# Patient Record
Sex: Female | Born: 1935
Health system: Southern US, Community
[De-identification: ages and names within clinical notes are randomized; demographics above are authoritative.]

## PROBLEM LIST (undated history)

## (undated) DIAGNOSIS — H3321 Serous retinal detachment, right eye: Secondary | ICD-10-CM

## (undated) DIAGNOSIS — K146 Glossodynia: Secondary | ICD-10-CM

## (undated) DIAGNOSIS — K449 Diaphragmatic hernia without obstruction or gangrene: Secondary | ICD-10-CM

## (undated) DIAGNOSIS — K219 Gastro-esophageal reflux disease without esophagitis: Secondary | ICD-10-CM

## (undated) DIAGNOSIS — I4510 Unspecified right bundle-branch block: Secondary | ICD-10-CM

## (undated) DIAGNOSIS — Z9889 Other specified postprocedural states: Secondary | ICD-10-CM

## (undated) DIAGNOSIS — N814 Uterovaginal prolapse, unspecified: Secondary | ICD-10-CM

## (undated) DIAGNOSIS — H3530 Unspecified macular degeneration: Secondary | ICD-10-CM

## (undated) DIAGNOSIS — E785 Hyperlipidemia, unspecified: Secondary | ICD-10-CM

## (undated) DIAGNOSIS — K635 Polyp of colon: Secondary | ICD-10-CM

## (undated) DIAGNOSIS — R0600 Dyspnea, unspecified: Secondary | ICD-10-CM

## (undated) DIAGNOSIS — G4700 Insomnia, unspecified: Secondary | ICD-10-CM

## (undated) DIAGNOSIS — I050 Rheumatic mitral stenosis: Secondary | ICD-10-CM

## (undated) HISTORY — DX: Gastro-esophageal reflux disease without esophagitis: K21.9

## (undated) HISTORY — DX: Uterovaginal prolapse, unspecified: N81.4

## (undated) HISTORY — DX: Rheumatic mitral stenosis: I05.0

## (undated) HISTORY — DX: Unspecified macular degeneration: H35.30

## (undated) HISTORY — PX: ABDOMINAL HYSTERECTOMY: SHX81

## (undated) HISTORY — DX: Polyp of colon: K63.5

## (undated) HISTORY — DX: Diaphragmatic hernia without obstruction or gangrene: K44.9

## (undated) HISTORY — DX: Unspecified right bundle-branch block: I45.10

## (undated) HISTORY — DX: Insomnia, unspecified: G47.00

## (undated) HISTORY — DX: Hyperlipidemia, unspecified: E78.5

## (undated) HISTORY — DX: Serous retinal detachment, right eye: H33.21

## (undated) HISTORY — DX: Other specified postprocedural states: Z98.890

## (undated) HISTORY — PX: TONSILLECTOMY: SUR1361

## (undated) HISTORY — PX: FOOT SURGERY: SHX648

## (undated) HISTORY — PX: RETINAL DETACHMENT SURGERY: SHX105

## (undated) HISTORY — PX: OOPHORECTOMY: SHX86

## (undated) HISTORY — DX: Dyspnea, unspecified: R06.00

---
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
## 2002-05-06 ENCOUNTER — Other Ambulatory Visit: Admission: RE | Admit: 2002-05-06 | Discharge: 2002-05-06 | Payer: Self-pay | Admitting: Family Medicine

## 2002-11-07 ENCOUNTER — Encounter: Payer: Self-pay | Admitting: Ophthalmology

## 2002-11-08 ENCOUNTER — Ambulatory Visit (HOSPITAL_COMMUNITY): Admission: RE | Admit: 2002-11-08 | Discharge: 2002-11-11 | Payer: Self-pay | Admitting: Ophthalmology

## 2003-03-21 ENCOUNTER — Encounter: Admission: RE | Admit: 2003-03-21 | Discharge: 2003-03-21 | Payer: Self-pay | Admitting: Family Medicine

## 2003-03-21 ENCOUNTER — Encounter: Payer: Self-pay | Admitting: Family Medicine

## 2003-09-05 ENCOUNTER — Ambulatory Visit (HOSPITAL_COMMUNITY): Admission: RE | Admit: 2003-09-05 | Discharge: 2003-09-06 | Payer: Self-pay | Admitting: Ophthalmology

## 2003-10-08 ENCOUNTER — Ambulatory Visit (HOSPITAL_COMMUNITY): Admission: RE | Admit: 2003-10-08 | Discharge: 2003-10-08 | Payer: Self-pay | Admitting: Ophthalmology

## 2003-11-10 ENCOUNTER — Ambulatory Visit (HOSPITAL_COMMUNITY): Admission: RE | Admit: 2003-11-10 | Discharge: 2003-11-10 | Payer: Self-pay | Admitting: Ophthalmology

## 2004-04-13 ENCOUNTER — Encounter: Admission: RE | Admit: 2004-04-13 | Discharge: 2004-04-13 | Payer: Self-pay | Admitting: Family Medicine

## 2004-09-16 ENCOUNTER — Ambulatory Visit (HOSPITAL_COMMUNITY): Admission: RE | Admit: 2004-09-16 | Discharge: 2004-09-16 | Payer: Self-pay | Admitting: *Deleted

## 2004-09-16 ENCOUNTER — Encounter (INDEPENDENT_AMBULATORY_CARE_PROVIDER_SITE_OTHER): Payer: Self-pay | Admitting: *Deleted

## 2005-04-25 ENCOUNTER — Encounter: Admission: RE | Admit: 2005-04-25 | Discharge: 2005-04-25 | Payer: Self-pay | Admitting: Family Medicine

## 2006-05-19 ENCOUNTER — Encounter: Admission: RE | Admit: 2006-05-19 | Discharge: 2006-05-19 | Payer: Self-pay | Admitting: Family Medicine

## 2006-10-12 ENCOUNTER — Ambulatory Visit (HOSPITAL_COMMUNITY): Admission: RE | Admit: 2006-10-12 | Discharge: 2006-10-12 | Payer: Self-pay | Admitting: *Deleted

## 2007-03-19 ENCOUNTER — Ambulatory Visit (HOSPITAL_COMMUNITY): Admission: RE | Admit: 2007-03-19 | Discharge: 2007-03-19 | Payer: Self-pay | Admitting: Ophthalmology

## 2007-03-21 LAB — HM COLONOSCOPY: HM Colonoscopy: NORMAL

## 2007-05-15 ENCOUNTER — Encounter: Admission: RE | Admit: 2007-05-15 | Discharge: 2007-05-15 | Payer: Self-pay | Admitting: *Deleted

## 2007-05-21 ENCOUNTER — Encounter: Admission: RE | Admit: 2007-05-21 | Discharge: 2007-05-21 | Payer: Self-pay | Admitting: Family Medicine

## 2008-05-23 ENCOUNTER — Encounter: Admission: RE | Admit: 2008-05-23 | Discharge: 2008-05-23 | Payer: Self-pay | Admitting: Family Medicine

## 2008-12-23 ENCOUNTER — Encounter: Payer: Self-pay | Admitting: Family Medicine

## 2009-03-24 ENCOUNTER — Ambulatory Visit: Payer: Self-pay | Admitting: Family Medicine

## 2009-03-24 DIAGNOSIS — Z860100 Personal history of colon polyps, unspecified: Secondary | ICD-10-CM | POA: Insufficient documentation

## 2009-03-24 DIAGNOSIS — E785 Hyperlipidemia, unspecified: Secondary | ICD-10-CM | POA: Insufficient documentation

## 2009-03-24 DIAGNOSIS — E119 Type 2 diabetes mellitus without complications: Secondary | ICD-10-CM | POA: Insufficient documentation

## 2009-03-24 DIAGNOSIS — K219 Gastro-esophageal reflux disease without esophagitis: Secondary | ICD-10-CM | POA: Insufficient documentation

## 2009-03-24 DIAGNOSIS — E1169 Type 2 diabetes mellitus with other specified complication: Secondary | ICD-10-CM | POA: Insufficient documentation

## 2009-03-25 LAB — CONVERTED CEMR LAB: Hgb A1c MFr Bld: 6.6 % — ABNORMAL HIGH (ref 4.6–6.5)

## 2009-05-20 ENCOUNTER — Encounter (INDEPENDENT_AMBULATORY_CARE_PROVIDER_SITE_OTHER): Payer: Self-pay | Admitting: *Deleted

## 2009-05-25 ENCOUNTER — Encounter: Admission: RE | Admit: 2009-05-25 | Discharge: 2009-05-25 | Payer: Self-pay | Admitting: Family Medicine

## 2009-07-08 ENCOUNTER — Ambulatory Visit: Payer: Self-pay | Admitting: Family Medicine

## 2009-07-08 LAB — CONVERTED CEMR LAB
Glucose, Urine, Semiquant: NEGATIVE
HDL goal, serum: 40 mg/dL
LDL Goal: 100 mg/dL
Specific Gravity, Urine: 1.015
pH: 6

## 2009-07-10 LAB — CONVERTED CEMR LAB
ALT: 21 units/L (ref 0–35)
AST: 23 units/L (ref 0–37)
Alkaline Phosphatase: 61 units/L (ref 39–117)
Bilirubin, Direct: 0.1 mg/dL (ref 0.0–0.3)
Microalb Creat Ratio: 9.1 mg/g (ref 0.0–30.0)
Total Bilirubin: 0.8 mg/dL (ref 0.3–1.2)
Total CHOL/HDL Ratio: 3

## 2009-07-30 ENCOUNTER — Ambulatory Visit: Payer: Self-pay | Admitting: Family Medicine

## 2009-07-30 LAB — CONVERTED CEMR LAB
Bilirubin Urine: NEGATIVE
Glucose, Urine, Semiquant: NEGATIVE
Protein, U semiquant: NEGATIVE
Specific Gravity, Urine: 1.02
pH: 7

## 2009-09-02 ENCOUNTER — Ambulatory Visit: Payer: Self-pay | Admitting: Family Medicine

## 2009-09-02 LAB — CONVERTED CEMR LAB
BUN: 15 mg/dL (ref 6–23)
Basophils Absolute: 0 10*3/uL (ref 0.0–0.1)
CO2: 28 meq/L (ref 19–32)
Chloride: 107 meq/L (ref 96–112)
Eosinophils Relative: 1.6 % (ref 0.0–5.0)
GFR calc non Af Amer: 65.26 mL/min (ref >60)
HCT: 40.4 % (ref 36.0–46.0)
Hemoglobin: 13.2 g/dL (ref 12.0–15.0)
Lymphocytes Relative: 33.9 % (ref 12.0–46.0)
Lymphs Abs: 2.1 10*3/uL (ref 0.7–4.0)
Monocytes Relative: 8.2 % (ref 3.0–12.0)
Neutro Abs: 3.5 10*3/uL (ref 1.4–7.7)
Platelets: 314 10*3/uL (ref 150.0–400.0)
RDW: 12.3 % (ref 11.5–14.6)
Sodium: 140 meq/L (ref 135–145)
WBC: 6.2 10*3/uL (ref 4.5–10.5)

## 2009-09-16 ENCOUNTER — Ambulatory Visit: Payer: Self-pay | Admitting: Family Medicine

## 2009-11-11 ENCOUNTER — Ambulatory Visit: Payer: Self-pay | Admitting: Family Medicine

## 2010-01-06 ENCOUNTER — Ambulatory Visit: Payer: Self-pay | Admitting: Family Medicine

## 2010-01-06 DIAGNOSIS — G4700 Insomnia, unspecified: Secondary | ICD-10-CM | POA: Insufficient documentation

## 2010-05-17 ENCOUNTER — Encounter: Payer: Self-pay | Admitting: Family Medicine

## 2010-05-17 ENCOUNTER — Ambulatory Visit: Payer: Self-pay | Admitting: Family Medicine

## 2010-05-24 ENCOUNTER — Telehealth: Payer: Self-pay | Admitting: Family Medicine

## 2010-05-26 ENCOUNTER — Encounter
Admission: RE | Admit: 2010-05-26 | Discharge: 2010-05-26 | Payer: Self-pay | Source: Home / Self Care | Admitting: Family Medicine

## 2010-05-26 LAB — HM MAMMOGRAPHY

## 2010-06-26 ENCOUNTER — Ambulatory Visit
Admission: RE | Admit: 2010-06-26 | Discharge: 2010-06-26 | Payer: Self-pay | Source: Home / Self Care | Attending: Family Medicine | Admitting: Family Medicine

## 2010-07-14 ENCOUNTER — Other Ambulatory Visit: Payer: Self-pay | Admitting: Family Medicine

## 2010-07-14 ENCOUNTER — Ambulatory Visit
Admission: RE | Admit: 2010-07-14 | Discharge: 2010-07-14 | Payer: Self-pay | Source: Home / Self Care | Attending: Family Medicine | Admitting: Family Medicine

## 2010-07-14 LAB — MICROALBUMIN / CREATININE URINE RATIO: Creatinine,U: 108.1 mg/dL

## 2010-07-14 LAB — LIPID PANEL
Cholesterol: 290 mg/dL — ABNORMAL HIGH (ref 0–200)
HDL: 67.4 mg/dL (ref >39.00)
VLDL: 37.2 mg/dL (ref 0.0–40.0)

## 2010-07-14 LAB — HEPATIC FUNCTION PANEL
ALT: 22 U/L (ref 0–35)
Albumin: 4.2 g/dL (ref 3.5–5.2)
Alkaline Phosphatase: 62 U/L (ref 39–117)
Total Protein: 7.1 g/dL (ref 6.0–8.3)

## 2010-07-14 LAB — HM DIABETES FOOT EXAM

## 2010-07-22 NOTE — Assessment & Plan Note (Signed)
Summary: 6 month rov/njr   Vital Signs:  Patient profile:   76 year old female Menstrual status:  hysterectomy Height:      61.50 inches Weight:      145 pounds Temp:     98.3 degrees F oral BP sitting:   120 / 78  (left arm) Cuff size:   regular  Vitals Entered By: Sid Falcon LPN (July 14, 2010 10:18 AM) CC: 6 month follow-up, Lipid Management Is Patient Diabetic? Yes Did you bring your meter with you today? No   History of Present Illness: Patient here for multiple medical problems followup. Type 2 diabetes well-controlled with recent fasting blood sugars around 110-120. One brief episode of mild hypoglycemia yesterday otherwise no episodes. Compliant with all medications. Last eye exam in December.  History of GERD controlled with Nexium. Recurrent symptoms when she tries to stop medication.  History of hyperlipidemia treated with simvastatin. No myalgias. Due for lab work. She relates that she stopped lipid med about 3 weeks ago after hearing some neg report about Simvastatin on the news.  Denies any recent chest pain, dizziness, or shortness of breath.  Diabetes Management History:      She has not been enrolled in the "Diabetic Education Program".  She is checking home blood sugars.  She says that she is not exercising regularly.        No hyperglycemic symptoms are reported.    Lipid Management History:      Positive NCEP/ATP III risk factors include female age 37 years old or older and diabetes.  Negative NCEP/ATP III risk factors include HDL cholesterol greater than 60, no family history for ischemic heart disease, non-tobacco-user status, non-hypertensive, no ASHD (atherosclerotic heart disease), no prior stroke/TIA, no peripheral vascular disease, and no history of aortic aneurysm.     Allergies (verified): No Known Drug Allergies  Past History:  Past Medical History: Last updated: 03/24/2009 Diabetes Colonic polyps, hx  of GERD Hyperlipidemia UTI  Past Surgical History: Last updated: 03/24/2009 Hysterectomy Tonsillectomy Foot surgery 1994 Detached retina 2003-2008  Family History: Last updated: 03/24/2009 Family History Breast cancer  family history heart disease  Social History: Last updated: 03/24/2009 Retired from [employer] Married Never Smoked Alcohol use-no Regular exercise-no  Risk Factors: Exercise: no (03/24/2009)  Risk Factors: Smoking Status: never (03/24/2009) PMH-FH-SH reviewed for relevance  Review of Systems  The patient denies anorexia, fever, weight loss, weight gain, vision loss, decreased hearing, chest pain, syncope, dyspnea on exertion, peripheral edema, prolonged cough, headaches, hemoptysis, abdominal pain, melena, hematochezia, severe indigestion/heartburn, incontinence, muscle weakness, suspicious skin lesions, transient blindness, difficulty walking, depression, unusual weight change, abnormal bleeding, enlarged lymph nodes, and breast masses.    Physical Exam  General:  Well-developed,well-nourished,in no acute distress; alert,appropriate and cooperative throughout examination Head:  Normocephalic and atraumatic without obvious abnormalities. No apparent alopecia or balding. Eyes:  pupils equal, pupils round, and pupils reactive to light.  Cataract L eye Ears:  External ear exam shows no significant lesions or deformities.  Otoscopic examination reveals clear canals, tympanic membranes are intact bilaterally without bulging, retraction, inflammation or discharge. Hearing is grossly normal bilaterally. Mouth:  Oral mucosa and oropharynx without lesions or exudates.  Teeth in good repair. Neck:  No deformities, masses, or tenderness noted. Lungs:  Normal respiratory effort, chest expands symmetrically. Lungs are clear to auscultation, no crackles or wheezes. Heart:  Normal rate and regular rhythm. S1 and S2 normal without gallop, murmur, click, rub or other  extra sounds. Abdomen:  soft and non-tender.   Extremities:  No clubbing, cyanosis, edema, or deformity noted with normal full range of motion of all joints.   Neurologic:  alert & oriented X3 and cranial nerves II-XII intact.   Skin:  no rashes and no suspicious lesions.   Cervical Nodes:  No lymphadenopathy noted Psych:  Oriented X3, normally interactive, good eye contact, and not anxious appearing.    Diabetes Management Exam:    Foot Exam (with socks and/or shoes not present):       Sensory-Pinprick/Light touch:          Left medial foot (L-4): normal          Left dorsal foot (L-5): normal          Left lateral foot (S-1): normal          Right medial foot (L-4): normal          Right dorsal foot (L-5): normal          Right lateral foot (S-1): normal       Sensory-Monofilament:          Left foot: normal          Right foot: normal       Inspection:          Left foot: normal          Right foot: normal       Nails:          Left foot: normal          Right foot: normal   Impression & Recommendations:  Problem # 1:  DIAB W/O COMP TYPE II/UNS NOT STATED UNCNTRL (ICD-250.00)  Her updated medication list for this problem includes:    Aspirin 81 Mg Tabs (Aspirin) ..... Once daily    Metformin Hcl 500 Mg Tabs (Metformin hcl) .Marland Kitchen... 2 tabs two times a day    Amaryl 4 Mg Tabs (Glimepiride) ..... Once daily  Orders: Venipuncture (04540) Specimen Handling (98119) TLB-A1C / Hgb A1C (Glycohemoglobin) (83036-A1C) TLB-Microalbumin/Creat Ratio, Urine (82043-MALB)  Problem # 2:  HYPERLIPIDEMIA (ICD-272.4) poor compliance with therapy. Her updated medication list for this problem includes:    Simvastatin 40 Mg Tabs (Simvastatin) ..... One by mouth q hs  Orders: Venipuncture (14782) Specimen Handling (95621) TLB-Lipid Panel (80061-LIPID) TLB-Hepatic/Liver Function Pnl (80076-HEPATIC)  Problem # 3:  INSOMNIA, CHRONIC (ICD-307.42)  Problem # 4:  GERD (ICD-530.81)  Her  updated medication list for this problem includes:    Nexium 40 Mg Cpdr (Esomeprazole magnesium) ..... Once daily  Complete Medication List: 1)  Aspirin 81 Mg Tabs (Aspirin) .... Once daily 2)  Metformin Hcl 500 Mg Tabs (Metformin hcl) .... 2 tabs two times a day 3)  Simvastatin 40 Mg Tabs (Simvastatin) .... One by mouth q hs 4)  Nexium 40 Mg Cpdr (Esomeprazole magnesium) .... Once daily 5)  Amaryl 4 Mg Tabs (Glimepiride) .... Once daily 6)  Allegra 180 Mg Tabs (Fexofenadine hcl) .... As needed 7)  Alprazolam 0.5 Mg Tabs (Alprazolam) .... 1/2 tab at bedtime prn 8)  B-100 Complex Tabs (Vitamins-lipotropics) .... Once daily 9)  Daily Vitamins For Women Tabs (Multiple vitamins-calcium) .... Once daily 10)  Oysco D 250-125 Mg-unit Tabs (Calcium carbonate-vitamin d) .... Once daily 11)  Tramadol Hcl 50 Mg Tabs (Tramadol hcl) .Marland Kitchen.. 1-2 tablets by mouth q 6 hours as needed pain 12)  Ibuprofen 800 Mg Tabs (Ibuprofen) .... One tab every 8 hours with food as needed pain 13)  Cyclobenzaprine  Hcl 5 Mg Tabs (Cyclobenzaprine hcl) .... One tab at bedtime as needed 14)  Hydromet 5-1.5 Mg/36ml Syrp (Hydrocodone-homatropine) .... 1/2 to 1 tsp three times a day prn  Diabetes Management Assessment/Plan:      The following lipid goals have been established for the patient: Total cholesterol goal of 200; LDL cholesterol goal of 100; HDL cholesterol goal of 40; Triglyceride goal of 150.    Lipid Assessment/Plan:      Based on NCEP/ATP III, the patient's risk factor category is "history of diabetes".  The patient's lipid goals are as follows: Total cholesterol goal is 200; LDL cholesterol goal is 100; HDL cholesterol goal is 40; Triglyceride goal is 150.    Patient Instructions: 1)  Please schedule a follow-up appointment in 6 months .  2)  It is important that you exercise reguarly at least 20 minutes 5 times a week. If you develop chest pain, have severe difficulty breathing, or feel very tired, stop exercising  immediately and seek medical attention.  3)  Check your blood sugars regularly. If your readings are usually above:  or below 70 you should contact our office.  4)  It is important that your diabetic A1c level is checked every 3 months.  5)  See your eye doctor yearly to check for diabetic eye damage. 6)  Check your feet each night  for sore areas, calluses or signs of infection.  Prescriptions: ALPRAZOLAM 0.5 MG TABS (ALPRAZOLAM) 1/2 tab at bedtime prn  #30 x 2   Entered and Authorized by:   Evelena Peat MD   Signed by:   Evelena Peat MD on 07/14/2010   Method used:   Print then Give to Patient   RxID:   [id number]    Orders Added: 1)  Venipuncture [44010] 2)  Specimen Handling [99000] 3)  TLB-Lipid Panel [80061-LIPID] 4)  TLB-Hepatic/Liver Function Pnl [80076-HEPATIC] 5)  TLB-A1C / Hgb A1C (Glycohemoglobin) [83036-A1C] 6)  TLB-Microalbumin/Creat Ratio, Urine [82043-MALB] 7)  Est. Patient Level IV [27253]

## 2010-07-22 NOTE — Assessment & Plan Note (Signed)
Summary: 2 wk rov/njr   Vital Signs:  Patient profile:   76 year old female Menstrual status:  hysterectomy Temp:     99.0 degrees F oral BP sitting:   118 / 70  (left arm) Cuff size:   regular  Vitals Entered By: Sid Falcon LPN (September 16, 2009 11:11 AM) CC: 2 week follow-up visit for back pain   History of Present Illness: Followup back pain. Midthoracic area somewhat poorly localized. We obtained x-rays including chest x-ray and thoracic spine films which were unremarkable. Labs including CBC, chemistries and sedimentation rate were normal. Pain still rated occasionally 8/10 in severity. Aleve helps. Overall pain is improved somewhat. No clear aggravating factors. Has good appetite and no weight change. Denies any dyspnea or pleuritic pain. No focal weakness  Allergies (verified): No Known Drug Allergies  Past History:  Past Medical History: Last updated: 03/24/2009 Diabetes Colonic polyps, hx of GERD Hyperlipidemia UTI  Review of Systems      See HPI  Physical Exam  General:  Well-developed,well-nourished,in no acute distress; alert,appropriate and cooperative throughout examination Lungs:  Normal respiratory effort, chest expands symmetrically. Lungs are clear to auscultation, no crackles or wheezes. Heart:  normal rate and regular rhythm.   Msk:  no spinal tenderness. No reproducible muscular tenderness. Neurologic:  alert & oriented X3, cranial nerves II-XII intact, and strength normal in all extremities.   Skin:  no rash   Impression & Recommendations:  Problem # 1:  BACK PAIN, THORACIC REGION (ICD-724.1) Assessment Improved continue Aleve as needed. Patient be in touch if pain not resolving over the next few weeks Her updated medication list for this problem includes:    Aspirin 81 Mg Tabs (Aspirin) ..... Once daily  Complete Medication List: 1)  Aspirin 81 Mg Tabs (Aspirin) .... Once daily 2)  Metformin Hcl 500 Mg Tabs (Metformin hcl) .... 2 tabs two  times a day 3)  Simvastatin 40 Mg Tabs (Simvastatin) .... One by mouth q hs 4)  Nexium 40 Mg Cpdr (Esomeprazole magnesium) .... Once daily 5)  Amaryl 4 Mg Tabs (Glimepiride) .... Once daily 6)  Allegra 180 Mg Tabs (Fexofenadine hcl) .... As needed 7)  Alprazolam 0.5 Mg Tabs (Alprazolam) .... 1/2 tab at bedtime prn 8)  B-100 Complex Tabs (Vitamins-lipotropics) .... Once daily 9)  Daily Vitamins For Women Tabs (Multiple vitamins-calcium) .... Once daily 10)  Oysco D 250-125 Mg-unit Tabs (Calcium carbonate-vitamin d) .... Once daily

## 2010-07-22 NOTE — Assessment & Plan Note (Signed)
Summary: congestion//ccm   Vital Signs:  Patient profile:   76 year old female Menstrual status:  hysterectomy Temp:     98.9 degrees F oral BP sitting:   104 / 70  (left arm) Cuff size:   regular  Vitals Entered By: Sid Falcon LPN (Nov 11, 2009 4:16 PM) CC: Congestion, cough, nasal drainage X 5 days   History of Present Illness: onset Sat of cough, nasal cong, fatigue. No fever. No sore throat.  No nausea, vomiting , diarrhea.  Taking OTC antihistamine with minimal reilief. No sick contacts.  Allergies (verified): No Known Drug Allergies  Past History:  Past Medical History: Last updated: 03/24/2009 Diabetes Colonic polyps, hx of GERD Hyperlipidemia UTI  Past Surgical History: Last updated: 03/24/2009 Hysterectomy Tonsillectomy Foot surgery 1994 Detached retina 2003-2008  Social History: Last updated: 03/24/2009 Retired from [employer] Married Never Smoked Alcohol use-no Regular exercise-no PMH-FH-SH reviewed for relevance  Review of Systems       The patient complains of melena.  The patient denies fever, weight loss, chest pain, syncope, dyspnea on exertion, prolonged cough, headaches, and hemoptysis.    Physical Exam  General:  Well-developed,well-nourished,in no acute distress; alert,appropriate and cooperative throughout examination Ears:  External ear exam shows no significant lesions or deformities.  Otoscopic examination reveals clear canals, tympanic membranes are intact bilaterally without bulging, retraction, inflammation or discharge. Hearing is grossly normal bilaterally. Nose:  clear mucus. Mouth:  Oral mucosa and oropharynx without lesions or exudates.  Teeth in good repair. Neck:  No deformities, masses, or tenderness noted. Lungs:  Normal respiratory effort, chest expands symmetrically. Lungs are clear to auscultation, no crackles or wheezes. Heart:  normal rate and regular rhythm.     Impression & Recommendations:  Problem #  1:  VIRAL URI (ICD-465.9) Assessment New cough med as needed. Her updated medication list for this problem includes:    Aspirin 81 Mg Tabs (Aspirin) ..... Once daily    Allegra 180 Mg Tabs (Fexofenadine hcl) .Marland Kitchen... As needed    Hydrocodone-homatropine 5-1.5 Mg/41ml Syrp (Hydrocodone-homatropine) ..... One tsp by mouth q 4-6 hours as needed cough  Orders: Prescription Created Electronically [id number])  Complete Medication List: 1)  Aspirin 81 Mg Tabs (Aspirin) .... Once daily 2)  Metformin Hcl 500 Mg Tabs (Metformin hcl) .... 2 tabs two times a day 3)  Simvastatin 40 Mg Tabs (Simvastatin) .... One by mouth q hs 4)  Nexium 40 Mg Cpdr (Esomeprazole magnesium) .... Once daily 5)  Amaryl 4 Mg Tabs (Glimepiride) .... Once daily 6)  Allegra 180 Mg Tabs (Fexofenadine hcl) .... As needed 7)  Alprazolam 0.5 Mg Tabs (Alprazolam) .... 1/2 tab at bedtime prn 8)  B-100 Complex Tabs (Vitamins-lipotropics) .... Once daily 9)  Daily Vitamins For Women Tabs (Multiple vitamins-calcium) .... Once daily 10)  Oysco D 250-125 Mg-unit Tabs (Calcium carbonate-vitamin d) .... Once daily 11)  Hydrocodone-homatropine 5-1.5 Mg/18ml Syrp (Hydrocodone-homatropine) .... One tsp by mouth q 4-6 hours as needed cough  Patient Instructions: 1)  Get plenty of rest, drink lots of clear liquids, and use Tylenol or Ibuprofen for fever and comfort. Return in 7-10 days if you're not better: sooner if you'er feeling worse.  Prescriptions: HYDROCODONE-HOMATROPINE 5-1.5 MG/5ML SYRP (HYDROCODONE-HOMATROPINE) one tsp by mouth q 4-6 hours as needed cough  #120 ml x 0   Entered and Authorized by:   Evelena Peat MD   Signed by:   Evelena Peat MD on 11/11/2009   Method used:   Print then Give to  Patient   RxID:   [id number]

## 2010-07-22 NOTE — Assessment & Plan Note (Signed)
Summary: 3 month rov/njr---PT [facility] // RS/pt rescd//ccm   Vital Signs:  Patient profile:   76 year old female Menstrual status:  hysterectomy Weight:      147 pounds Temp:     97.8 degrees F oral BP sitting:   120 / 78  (left arm) Cuff size:   regular  Vitals Entered By: Sid Falcon LPN (July 08, 2009 8:49 AM) CC: 3 month follow up visit, pt fasting, Lipid Management   History of Present Illness: Patient here for medical followup. Type 2 diabetes which has been well-controlled by recent A1c. Hyperlipidemia treated with Crestor. Had been taking inconsistently but taking regularly since last visit. Due for lab work.  Blood sugars well controlled. No hypoglycemic symptoms. Recent eye exam December 1 normal with no retinopathy changes.  Diabetes Management History:      She has not been enrolled in the "Diabetic Education Program".  She states understanding of dietary principles and is following her diet appropriately.  No sensory loss is reported.  Self foot exams are being performed.  She is checking home blood sugars.  She says that she is not exercising regularly.        Hypoglycemic symptoms are not occurring.  No hyperglycemic symptoms are reported.        Symptoms which suggest diabetic complications include none and vision problems.  No changes have been made to her treatment plan since last visit.    Lipid Management History:      Positive NCEP/ATP III risk factors include female age 56 years old or older and diabetes.  Negative NCEP/ATP III risk factors include no family history for ischemic heart disease, non-tobacco-user status, non-hypertensive, no ASHD (atherosclerotic heart disease), no prior stroke/TIA, no peripheral vascular disease, and no history of aortic aneurysm.     Allergies (verified): No Known Drug Allergies  Past History:  Past Medical History: Last updated: 03/24/2009 Diabetes Colonic polyps, hx of GERD Hyperlipidemia UTI  Past Surgical  History: Last updated: 03/24/2009 Hysterectomy Tonsillectomy Foot surgery 1994 Detached retina 2003-2008  Social History: Last updated: 03/24/2009 Retired from [employer] Married Never Smoked Alcohol use-no Regular exercise-no  Review of Systems  The patient denies chest pain, syncope, dyspnea on exertion, peripheral edema, prolonged cough, headaches, hemoptysis, abdominal pain, melena, hematochezia, hematuria, and incontinence.    Physical Exam  General:  Well-developed,well-nourished,in no acute distress; alert,appropriate and cooperative throughout examination Eyes:  vision grossly intact.   Ears:  External ear exam shows no significant lesions or deformities.  Otoscopic examination reveals clear canals, tympanic membranes are intact bilaterally without bulging, retraction, inflammation or discharge. Hearing is grossly normal bilaterally. Mouth:  Oral mucosa and oropharynx without lesions or exudates.  Teeth in good repair. Neck:  No deformities, masses, or tenderness noted. Lungs:  Normal respiratory effort, chest expands symmetrically. Lungs are clear to auscultation, no crackles or wheezes. Heart:  Normal rate and regular rhythm. S1 and S2 normal without gallop, murmur, click, rub or other extra sounds. Extremities:  No clubbing, cyanosis, edema, or deformity noted with normal full range of motion of all joints.    Diabetes Management Exam:    Foot Exam (with socks and/or shoes not present):       Sensory-Pinprick/Light touch:          Left medial foot (L-4): normal          Left dorsal foot (L-5): normal          Left lateral foot (S-1): normal  Right medial foot (L-4): normal          Right dorsal foot (L-5): normal          Right lateral foot (S-1): normal       Sensory-Monofilament:          Left foot: normal          Right foot: normal       Inspection:          Left foot: normal          Right foot: normal       Nails:          Left foot: normal           Right foot: normal    Eye Exam:       Eye Exam done elsewhere          Date: 05/20/2009          Results: normal          Done by: Dr Dione Booze   Impression & Recommendations:  Problem # 1:  DIAB W/O COMP TYPE II/UNS NOT STATED UNCNTRL (ICD-250.00)  Her updated medication list for this problem includes:    Aspirin 81 Mg Tabs (Aspirin) ..... Once daily    Metformin Hcl 500 Mg Tabs (Metformin hcl) .Marland Kitchen... 2 tabs two times a day    Amaryl 4 Mg Tabs (Glimepiride) ..... Once daily  Orders: Venipuncture (16109) TLB-A1C / Hgb A1C (Glycohemoglobin) (83036-A1C) TLB-Microalbumin/Creat Ratio, Urine (82043-MALB)  Problem # 2:  HYPERLIPIDEMIA (ICD-272.4)  Her updated medication list for this problem includes:    Crestor 10 Mg Tabs (Rosuvastatin calcium) ..... Once daily  Orders: Venipuncture (60454) TLB-Lipid Panel (80061-LIPID) TLB-Hepatic/Liver Function Pnl (80076-HEPATIC)  Complete Medication List: 1)  Aspirin 81 Mg Tabs (Aspirin) .... Once daily 2)  Metformin Hcl 500 Mg Tabs (Metformin hcl) .... 2 tabs two times a day 3)  Crestor 10 Mg Tabs (Rosuvastatin calcium) .... Once daily 4)  Nexium 40 Mg Cpdr (Esomeprazole magnesium) .... Once daily 5)  Amaryl 4 Mg Tabs (Glimepiride) .... Once daily 6)  Allegra 180 Mg Tabs (Fexofenadine hcl) .... As needed 7)  Alprazolam 0.5 Mg Tabs (Alprazolam) .... 1/2 tab at bedtime prn 8)  B-100 Complex Tabs (Vitamins-lipotropics) .... Once daily 9)  Daily Vitamins For Women Tabs (Multiple vitamins-calcium) .... Once daily 10)  Oysco D 250-125 Mg-unit Tabs (Calcium carbonate-vitamin d) .... Once daily  Diabetes Management Assessment/Plan:      The following lipid goals have been established for the patient: Total cholesterol goal of 200; LDL cholesterol goal of 100; HDL cholesterol goal of 40; Triglyceride goal of 150.    Lipid Assessment/Plan:      Based on NCEP/ATP III, the patient's risk factor category is "history of diabetes".  The patient's  lipid goals are as follows: Total cholesterol goal is 200; LDL cholesterol goal is 100; HDL cholesterol goal is 40; Triglyceride goal is 150.    Patient Instructions: 1)  Please schedule a follow-up appointment in 6 months .  2)  It is important that you exercise reguarly at least 20 minutes 5 times a week. If you develop chest pain, have severe difficulty breathing, or feel very tired, stop exercising immediately and seek medical attention.  3)  Check your blood sugars regularly. If your readings are usually above:150  or below 70 you should contact our office.  4)  It is important that your diabetic A1c level is checked every 3 months.  5)  See your eye doctor yearly to check for diabetic eye damage. 6)  Check your feet each night  for sore areas, calluses or signs of infection.    Laboratory Results   Urine Tests    Routine Urinalysis   Color: yellow Appearance: Clear Glucose: negative   (Normal Range: Negative) Bilirubin: negative   (Normal Range: Negative) Ketone: negative   (Normal Range: Negative) Spec. Gravity: 1.015   (Normal Range: 1.003-1.035) Blood: 1+   (Normal Range: Negative) pH: 6.0   (Normal Range: 5.0-8.0) Protein: negative   (Normal Range: Negative) Urobilinogen: 0.2   (Normal Range: 0-1) Nitrite: negative   (Normal Range: Negative) Leukocyte Esterace: 1+   (Normal Range: Negative)    Comments: Rita Ohara  July 08, 2009 1:32 PM

## 2010-07-22 NOTE — Assessment & Plan Note (Signed)
Summary: BACK PAIN // RS   Vital Signs:  Patient profile:   76 year old female Menstrual status:  hysterectomy Weight:      148 pounds O2 Sat:      95 % on Room air Temp:     98.0 degrees F oral BP sitting:   140 / 78  (left arm) Cuff size:   regular  Vitals Entered By: Sid Falcon LPN (September 02, 2009 1:48 PM)  O2 Flow:  Room air CC: mid back pain several months   History of Present Illness: Patient here for the following items.     Mid thoracic back pain which is poorly localized. Is not sure but thinks she's had several weeks and possibly several months of pain. This is a dull pain sometimes lasts several hours at a time and occurs at rest but worse with movement. Excedrin helps. Occasionally 8/10 severity. Denies any dyspnea, pleuritic pain, cough, or abdominal pain. Appetite and weight are stable. No injury.  she thinks she may have had chest x-ray couple years ago. She has some chronic dyspnea with exertion which is unchanged. Denies any chest pain. No nausea or vomiting.  Other issue is hyperlipidemia. Lipids have been well controlled. She has cost issues with Crestor and would  like to consider changing to generic.  Allergies (verified): No Known Drug Allergies  Past History:  Past Medical History: Last updated: 03/24/2009 Diabetes Colonic polyps, hx of GERD Hyperlipidemia UTI  Past Surgical History: Last updated: 03/24/2009 Hysterectomy Tonsillectomy Foot surgery 1994 Detached retina 2003-2008  Social History: Last updated: 03/24/2009 Retired from [employer] Married Never Smoked Alcohol use-no Regular exercise-no PMH-FH-SH reviewed for relevance  Review of Systems  The patient denies anorexia, fever, weight loss, chest pain, syncope, peripheral edema, prolonged cough, hemoptysis, abdominal pain, melena, hematochezia, and severe indigestion/heartburn.    Physical Exam  General:  Well-developed,well-nourished,in no acute distress;  alert,appropriate and cooperative throughout examination Mouth:  Oral mucosa and oropharynx without lesions or exudates.  Teeth in good repair. Neck:  No deformities, masses, or tenderness noted. Lungs:  Normal respiratory effort, chest expands symmetrically. Lungs are clear to auscultation, no crackles or wheezes. Heart:  Normal rate and regular rhythm. S1 and S2 normal without gallop, murmur, click, rub or other extra sounds. Abdomen:  soft, non-tender, normal bowel sounds, no masses, no hepatomegaly, and no splenomegaly.   Msk:  no spinal tenderness. No reproducible back tenderness. No rashes visible. Extremities:  no edema   Impression & Recommendations:  Problem # 1:  BACK PAIN, THORACIC REGION (ICD-724.1) ?etiology.  Given her age and location needs further evaluation. Her updated medication list for this problem includes:    Aspirin 81 Mg Tabs (Aspirin) ..... Once daily  Orders: T-Thoracic Spine 2 Views [phone]) Venipuncture (45409) T-2 View CXR (71020TC) TLB-CBC Platelet - w/Differential (85025-CBCD) TLB-BMP (Basic Metabolic Panel-BMET) (80048-METABOL) TLB-Sedimentation Rate (ESR) (85652-ESR)  Problem # 2:  HYPERLIPIDEMIA (ICD-272.4) change to simvastatin for cost issues. Her updated medication list for this problem includes:    Simvastatin 40 Mg Tabs (Simvastatin) ..... One by mouth q hs  Orders: Venipuncture (81191)  Complete Medication List: 1)  Aspirin 81 Mg Tabs (Aspirin) .... Once daily 2)  Metformin Hcl 500 Mg Tabs (Metformin hcl) .... 2 tabs two times a day 3)  Simvastatin 40 Mg Tabs (Simvastatin) .... One by mouth q hs 4)  Nexium 40 Mg Cpdr (Esomeprazole magnesium) .... Once daily 5)  Amaryl 4 Mg Tabs (Glimepiride) .... Once daily 6)  Allegra  180 Mg Tabs (Fexofenadine hcl) .... As needed 7)  Alprazolam 0.5 Mg Tabs (Alprazolam) .... 1/2 tab at bedtime prn 8)  B-100 Complex Tabs (Vitamins-lipotropics) .... Once daily 9)  Daily Vitamins For Women Tabs  (Multiple vitamins-calcium) .... Once daily 10)  Oysco D 250-125 Mg-unit Tabs (Calcium carbonate-vitamin d) .... Once daily  Patient Instructions: 1)  Please schedule a follow-up appointment in 2 weeks.  Prescriptions: SIMVASTATIN 40 MG TABS (SIMVASTATIN) one by mouth q hs  #30 x 6   Entered and Authorized by:   Evelena Peat MD   Signed by:   Evelena Peat MD on 09/02/2009   Method used:   Print then Give to Patient   RxID:   [id number]

## 2010-07-22 NOTE — Progress Notes (Signed)
Summary: REQUEST FOR RX Flexeril  Phone Note Call from Patient   Caller: Patient Summary of Call: Pt would like to have a Rx for Flexeril or sent to [facility]..... Pt adv she was seen for low back pain recently and would like to have med to help her sleep at night since she is currently having problems sleeping due to back pain...Marland Kitchen?  Pt can be reached at   [phone]  with any questions or concerns.  Initial call taken by: Debbra Riding,  May 24, 2010 11:12 AM  Follow-up for Phone Call        OK to call in cyclobenzaprine 5 mg by mouth at bedtime #20 with no refills. Follow-up by: Evelena Peat MD,  May 24, 2010 1:14 PM    New/Updated Medications: CYCLOBENZAPRINE HCL 5 MG TABS (CYCLOBENZAPRINE HCL) one tab at bedtime as needed Prescriptions: CYCLOBENZAPRINE HCL 5 MG TABS (CYCLOBENZAPRINE HCL) one tab at bedtime as needed  #20 x 0   Entered by:   Sid Falcon LPN   Authorized by:   Evelena Peat MD   Signed by:   Sid Falcon LPN on 09/81/1914   Method used:   Electronically to        [facility]  [address] (retail)       [address]       [place], [place]  [address]       Ph: [phone]       Fax: [phone]   RxID:   [id number]

## 2010-07-22 NOTE — Assessment & Plan Note (Signed)
Summary: 6 mo rov/mm   Vital Signs:  Patient profile:   76 year old female Menstrual status:  hysterectomy Height:      61.50 inches Weight:      142 pounds BMI:     26.49 Temp:     98 degrees F oral BP sitting:   130 / 80  (left arm) Cuff size:   regular  Vitals Entered By: Sid Falcon LPN (January 06, 2010 8:43 AM)  Nutrition Counseling: Patient's BMI is greater than 25 and therefore counseled on weight management options. CC: 6 month follow-up Is Patient Diabetic? Yes Did you bring your meter with you today? No   History of Present Illness: Patient seen for followup regarding medical problems.  Type 2 diabetes. Does not monitor blood sugars. No hyper or hypoglycemic symptoms. Medications reviewed. Compliant with all. Eye exams every 6 months. Prior history retina detachment. No diabetic retinopathy changes.  Hyperlipidemia. Takes simvastatin 40 mg daily. No myalgias. Lipids adequately controlled in January of this year.  History of some chronic insomnia. Remains on low-dose alprazolam at night. No history of misuse.  History of GERD. Symptoms well controlled on Nexium. She has tried on occasion to stop this but has had breakthrough symptoms. Not controlled with over-the-counter antacids.  Allergies (verified): No Known Drug Allergies  Past History:  Past Medical History: Last updated: 03/24/2009 Diabetes Colonic polyps, hx of GERD Hyperlipidemia UTI  Past Surgical History: Last updated: 03/24/2009 Hysterectomy Tonsillectomy Foot surgery 1994 Detached retina 2003-2008  Family History: Last updated: 03/24/2009 Family History Breast cancer  family history heart disease  Social History: Last updated: 03/24/2009 Retired from [employer] Married Never Smoked Alcohol use-no Regular exercise-no  Risk Factors: Exercise: no (03/24/2009)  Risk Factors: Smoking Status: never (03/24/2009) PMH-FH-SH reviewed for relevance  Review of Systems  The patient  denies anorexia, fever, weight loss, weight gain, chest pain, syncope, dyspnea on exertion, peripheral edema, prolonged cough, headaches, hemoptysis, abdominal pain, melena, hematochezia, and severe indigestion/heartburn.    Physical Exam  General:  Well-developed,well-nourished,in no acute distress; alert,appropriate and cooperative throughout examination Ears:  External ear exam shows no significant lesions or deformities.  Otoscopic examination reveals clear canals, tympanic membranes are intact bilaterally without bulging, retraction, inflammation or discharge. Hearing is grossly normal bilaterally. Mouth:  Oral mucosa and oropharynx without lesions or exudates.  Teeth in good repair. Neck:  No deformities, masses, or tenderness noted. Lungs:  Normal respiratory effort, chest expands symmetrically. Lungs are clear to auscultation, no crackles or wheezes. Heart:  normal rate and regular rhythm.   Extremities:  No clubbing, cyanosis, edema, or deformity noted with normal full range of motion of all joints.    Diabetes Management Exam:    Foot Exam (with socks and/or shoes not present):       Sensory-Pinprick/Light touch:          Left medial foot (L-4): normal          Left dorsal foot (L-5): normal          Left lateral foot (S-1): normal          Right medial foot (L-4): normal          Right dorsal foot (L-5): normal          Right lateral foot (S-1): normal       Sensory-Monofilament:          Left foot: normal          Right foot: normal  Inspection:          Left foot: normal          Right foot: normal       Nails:          Left foot: normal          Right foot: normal    Eye Exam:       Eye Exam done elsewhere          Date: 11/18/2009          Results: normal          Done by: Dr Dione Booze   Impression & Recommendations:  Problem # 1:  DIAB W/O COMP TYPE II/UNS NOT STATED UNCNTRL (ICD-250.00) repeat A1C. Her updated medication list for this problem includes:     Aspirin 81 Mg Tabs (Aspirin) ..... Once daily    Metformin Hcl 500 Mg Tabs (Metformin hcl) .Marland Kitchen... 2 tabs two times a day    Amaryl 4 Mg Tabs (Glimepiride) ..... Once daily  Orders: TLB-A1C / Hgb A1C (Glycohemoglobin) (83036-A1C) Prescription Created Electronically [phone]) Venipuncture (29528) Specimen Handling (41324)  Problem # 2:  HYPERLIPIDEMIA (ICD-272.4)  Her updated medication list for this problem includes:    Simvastatin 40 Mg Tabs (Simvastatin) ..... One by mouth q hs  Orders: Prescription Created Electronically [phone])  Problem # 3:  INSOMNIA, CHRONIC (ICD-307.42)  Problem # 4:  GERD (ICD-530.81)  Her updated medication list for this problem includes:    Nexium 40 Mg Cpdr (Esomeprazole magnesium) ..... Once daily  Complete Medication List: 1)  Aspirin 81 Mg Tabs (Aspirin) .... Once daily 2)  Metformin Hcl 500 Mg Tabs (Metformin hcl) .... 2 tabs two times a day 3)  Simvastatin 40 Mg Tabs (Simvastatin) .... One by mouth q hs 4)  Nexium 40 Mg Cpdr (Esomeprazole magnesium) .... Once daily 5)  Amaryl 4 Mg Tabs (Glimepiride) .... Once daily 6)  Allegra 180 Mg Tabs (Fexofenadine hcl) .... As needed 7)  Alprazolam 0.5 Mg Tabs (Alprazolam) .... 1/2 tab at bedtime prn 8)  B-100 Complex Tabs (Vitamins-lipotropics) .... Once daily 9)  Daily Vitamins For Women Tabs (Multiple vitamins-calcium) .... Once daily 10)  Oysco D 250-125 Mg-unit Tabs (Calcium carbonate-vitamin d) .... Once daily  Patient Instructions: 1)  Please schedule a follow-up appointment in 6 months .  2)  Check your blood sugars regularly. If your readings are usually above: 140 or below 70 you should contact our office.  3)  It is important that your diabetic A1c level is checked every 3 months.  4)  See your eye doctor yearly to check for diabetic eye damage. 5)  Check your feet each night  for sore areas, calluses or signs of infection.  Prescriptions: AMARYL 4 MG TABS (GLIMEPIRIDE) once daily  #30 x 6    Entered and Authorized by:   Evelena Peat MD   Signed by:   Evelena Peat MD on 01/06/2010   Method used:   Electronically to        [facility]  [address] (retail)       [address]       [place], [place]  [address]       Ph: [phone]       Fax: [phone]   RxID:   [id number] NEXIUM 40 MG CPDR (ESOMEPRAZOLE MAGNESIUM) once daily  #30 x 6   Entered and Authorized by:   Evelena Peat MD  Signed by:   Evelena Peat MD on 01/06/2010   Method used:   Electronically to        [facility]  [address] (retail)       [address]       [place], [place]  [address]       Ph: [phone]       Fax: [phone]   RxID:   [id number] SIMVASTATIN 40 MG TABS (SIMVASTATIN) one by mouth q hs  #30 x 6   Entered and Authorized by:   Evelena Peat MD   Signed by:   Evelena Peat MD on 01/06/2010   Method used:   Electronically to        [facility]  [address] (retail)       [address]       [place], [place]  [address]       Ph: [phone]       Fax: [phone]   RxID:   [id number] METFORMIN HCL 500 MG TABS (METFORMIN HCL) 2 tabs two times a day  #120 x 6   Entered and Authorized by:   Evelena Peat MD   Signed by:   Evelena Peat MD on 01/06/2010   Method used:   Electronically to        [facility]  [address] [address] (retail)       [address]       [place], [place]  [address]       Ph: [phone]       Fax: [phone]   RxID:   [id number]

## 2010-07-22 NOTE — Assessment & Plan Note (Signed)
Vital Signs:  Patient profile:   76 year old female Menstrual status:  hysterectomy Weight:      146 pounds Temp:     98.6 degrees F oral BP sitting:   122 / 80  (left arm) Cuff size:   regular  Vitals Entered By: Sid Falcon LPN (May 17, 2010 2:06 PM)  History of Present Illness: Low back pain which started several days ago. No specific injury but did do moving of furniture and raking of leaves Thursday prior to onset. Ibuprofen helps somewhat. Pain is sharp and worse with movement. No radiculopathy symptoms. Symptoms relatively constant.  Pain is moderate. Pain relieved with holding still.  Allergies (verified): No Known Drug Allergies  Past History:  Past Medical History: Last updated: 03/24/2009 Diabetes Colonic polyps, hx of GERD Hyperlipidemia UTI PMH reviewed for relevance  Review of Systems  The patient denies anorexia, fever, weight loss, abdominal pain, melena, hematochezia, severe indigestion/heartburn, hematuria, incontinence, muscle weakness, and difficulty walking.    Physical Exam  General:  Well-developed,well-nourished,in no acute distress; alert,appropriate and cooperative throughout examination Lungs:  Normal respiratory effort, chest expands symmetrically. Lungs are clear to auscultation, no crackles or wheezes. Heart:  Normal rate and regular rhythm. S1 and S2 normal without gallop, murmur, click, rub or other extra sounds. Extremities:  No clubbing, cyanosis, edema, or deformity noted with normal full range of motion of all joints.  straight leg raise is negative Neurologic:  alert & oriented X3, strength normal in all extremities, sensation intact to light touch, and DTRs symmetrical and normal.     Impression & Recommendations:  Problem # 1:  BACK PAIN, LUMBAR (ICD-724.2) Assessment New Suspect strain.  Nonfocal neuro.  Cont short term use of ibuprofen with cautions given.  Tramadol to supplement for pain contrrol and follow up 2 weeks  to reassess. Her updated medication list for this problem includes:    Aspirin 81 Mg Tabs (Aspirin) ..... Once daily    Tramadol Hcl 50 Mg Tabs (Tramadol hcl) .Marland Kitchen... 1-2 tablets by mouth q 6 hours as needed pain    Ibuprofen 800 Mg Tabs (Ibuprofen) ..... One tab every 8 hours with food as needed pain  Complete Medication List: 1)  Aspirin 81 Mg Tabs (Aspirin) .... Once daily 2)  Metformin Hcl 500 Mg Tabs (Metformin hcl) .... 2 tabs two times a day 3)  Simvastatin 40 Mg Tabs (Simvastatin) .... One by mouth q hs 4)  Nexium 40 Mg Cpdr (Esomeprazole magnesium) .... Once daily 5)  Amaryl 4 Mg Tabs (Glimepiride) .... Once daily 6)  Allegra 180 Mg Tabs (Fexofenadine hcl) .... As needed 7)  Alprazolam 0.5 Mg Tabs (Alprazolam) .... 1/2 tab at bedtime prn 8)  B-100 Complex Tabs (Vitamins-lipotropics) .... Once daily 9)  Daily Vitamins For Women Tabs (Multiple vitamins-calcium) .... Once daily 10)  Oysco D 250-125 Mg-unit Tabs (Calcium carbonate-vitamin d) .... Once daily 11)  Tramadol Hcl 50 Mg Tabs (Tramadol hcl) .Marland Kitchen.. 1-2 tablets by mouth q 6 hours as needed pain 12)  Ibuprofen 800 Mg Tabs (Ibuprofen) .... One tab every 8 hours with food as needed pain  Patient Instructions: 1)  Please schedule a follow-up appointment in 2 weeks.  2)  Most patients (90%) with low back pain will improve with time ( 2-6 weeks). Keep active but avoid activities that are painful. Apply moist heat and/or ice to lower back several times a day.  Prescriptions: IBUPROFEN 800 MG TABS (IBUPROFEN) one tab every 8 hours with food as  needed pain  #30 x 0   Entered by:   Sid Falcon LPN   Authorized by:   Evelena Peat MD   Signed by:   Sid Falcon LPN on 16/03/9603   Method used:   Electronically to        [facility]  [address] (retail)       [address]       [place], [place]  [address]       Ph: [phone]       Fax: [phone]   RxID:   [id number] TRAMADOL HCL 50 MG TABS (TRAMADOL HCL)  1-2 tablets by mouth q 6 hours as needed pain  #60 x 1   Entered and Authorized by:   Evelena Peat MD   Signed by:   Evelena Peat MD on 05/17/2010   Method used:   Electronically to        [facility]  [address] (retail)       [address]       [place], [place]  [address]       Ph: [phone]       Fax: [phone]   RxID:   [id number]    Orders Added: 1)  Est. Patient Level III [30160]

## 2010-07-22 NOTE — Assessment & Plan Note (Signed)
Summary: SORE THRAOT AND EAR PIAN/VJ   Vital Signs:  Patient profile:   76 year old female Menstrual status:  hysterectomy Height:      61.50 inches Weight:      145 pounds BMI:     27.05 O2 Sat:      96 % on Room air Temp:     98.9 degrees F rectal Pulse rate:   100 / minute BP sitting:   112 / 78  (left arm) Cuff size:   regular  Vitals Entered By: Payton Spark CMA (June 26, 2010 11:49 AM)  O2 Flow:  Room air CC: chest congestion, cough, drainage, ST and ear congestion x 3 days   CC:  chest congestion, cough, drainage, and ST and ear congestion x 3 days.  History of Present Illness: 76 y/o ns fem w 3 days h/o of st, np cough...no fever etc.....requesting steroids and atb!!!!!!!!!!!!!!!!  Allergies: No Known Drug Allergies  Past History:  Past medical, surgical, family and social histories (including risk factors) reviewed for relevance to current acute and chronic problems.  Past Medical History: Reviewed history from 03/24/2009 and no changes required. Diabetes Colonic polyps, hx of GERD Hyperlipidemia UTI  Past Surgical History: Reviewed history from 03/24/2009 and no changes required. Hysterectomy Tonsillectomy Foot surgery 1994 Detached retina 2003-2008  Family History: Reviewed history from 03/24/2009 and no changes required. Family History Breast cancer  family history heart disease  Social History: Reviewed history from 03/24/2009 and no changes required. Retired from [employer] Married Never Smoked Alcohol use-no Regular exercise-no  Review of Systems      See HPI  Physical Exam  General:  Well-developed,well-nourished,in no acute distress; alert,appropriate and cooperative throughout examination Head:  Normocephalic and atraumatic without obvious abnormalities. No apparent alopecia or balding. Eyes:  No corneal or conjunctival inflammation noted. EOMI. Perrla. Funduscopic exam benign, without hemorrhages, exudates or papilledema.  Vision grossly normal. Ears:  External ear exam shows no significant lesions or deformities.  Otoscopic examination reveals clear canals, tympanic membranes are intact bilaterally without bulging, retraction, inflammation or discharge. Hearing is grossly normal bilaterally. Nose:  External nasal examination shows no deformity or inflammation. Nasal mucosa are pink and moist without lesions or exudates. Mouth:  Oral mucosa and oropharynx without lesions or exudates.  Teeth in good repair. Neck:  No deformities, masses, or tenderness noted. Chest Wall:  No deformities, masses, or tenderness noted. Lungs:  Normal respiratory effort, chest expands symmetrically. Lungs are clear to auscultation, no crackles or wheezes.   Problems:  Medical Problems Added: 1)  Dx of Viral Infection-unspec  (ICD-079.99)  Impression & Recommendations:  Problem # 1:  VIRAL INFECTION-UNSPEC (ICD-079.99)  Her updated medication list for this problem includes:    Aspirin 81 Mg Tabs (Aspirin) ..... Once daily    Ibuprofen 800 Mg Tabs (Ibuprofen) ..... One tab every 8 hours with food as needed pain    Hydromet 5-1.5 Mg/59ml Syrp (Hydrocodone-homatropine) .Marland Kitchen... 1/2 to 1 tsp three times a day prn  Complete Medication List: 1)  Aspirin 81 Mg Tabs (Aspirin) .... Once daily 2)  Metformin Hcl 500 Mg Tabs (Metformin hcl) .... 2 tabs two times a day 3)  Simvastatin 40 Mg Tabs (Simvastatin) .... One by mouth q hs 4)  Nexium 40 Mg Cpdr (Esomeprazole magnesium) .... Once daily 5)  Amaryl 4 Mg Tabs (Glimepiride) .... Once daily 6)  Allegra 180 Mg Tabs (Fexofenadine hcl) .... As needed 7)  Alprazolam 0.5 Mg Tabs (Alprazolam) .... 1/2 tab  at bedtime prn 8)  B-100 Complex Tabs (Vitamins-lipotropics) .... Once daily 9)  Daily Vitamins For Women Tabs (Multiple vitamins-calcium) .... Once daily 10)  Oysco D 250-125 Mg-unit Tabs (Calcium carbonate-vitamin d) .... Once daily 11)  Tramadol Hcl 50 Mg Tabs (Tramadol hcl) .Marland Kitchen.. 1-2  tablets by mouth q 6 hours as needed pain 12)  Ibuprofen 800 Mg Tabs (Ibuprofen) .... One tab every 8 hours with food as needed pain 13)  Cyclobenzaprine Hcl 5 Mg Tabs (Cyclobenzaprine hcl) .... One tab at bedtime as needed 14)  Hydromet 5-1.5 Mg/71ml Syrp (Hydrocodone-homatropine) .... 1/2 to 1 tsp three times a day prn  Patient Instructions: 1)  drink lots of water 2)  vaporizer in bedroom 3)  Hydromet 1/2 to 1 tsp three times a day as needed 4)  Please schedule a follow-up appointment as needed. Prescriptions: HYDROMET 5-1.5 MG/5ML SYRP (HYDROCODONE-HOMATROPINE) 1/2 to 1 tsp three times a day prn  #8oz x 0   Entered and Authorized by:   Roderick Pee MD   Signed by:   Roderick Pee MD on 06/26/2010   Method used:   Print then Give to Patient   RxID:   [id number]    Orders Added: 1)  Est. Patient Level III [56213]

## 2010-08-12 ENCOUNTER — Other Ambulatory Visit: Payer: Self-pay | Admitting: Family Medicine

## 2010-08-12 DIAGNOSIS — K219 Gastro-esophageal reflux disease without esophagitis: Secondary | ICD-10-CM

## 2010-10-18 ENCOUNTER — Other Ambulatory Visit: Payer: Self-pay | Admitting: Family Medicine

## 2010-11-02 NOTE — Op Note (Signed)
Melissa, [name]                ACCOUNT NO.:  [id number]   MEDICAL RECORD NO.:  [id number]          PATIENT TYPE:  AMB   LOCATION:  SDS                          FACILITY:  [facility]   PHYSICIAN:  Alford Highland. Rankin, M.D.   DATE OF BIRTH:  1934-07-02   DATE OF PROCEDURE:  03/19/2007  DATE OF DISCHARGE:                               OPERATIVE REPORT   PREOPERATIVE DIAGNOSES:  1. Rhegmatogenous detachment, right eye, old, status post complex      retinal detachment repair with silicone oil placement.  2. Retained posterior implant right eye, silicone oil.  3. Abnormal adherence to the posterior surface of a posterior chamber      intraocular lens, right eye.  4. Intraocular lens malposition, right eye with capture anterior to      the iris superiorly.   PROCEDURE:  1. Posterior vitrectomy with endolaser pan photocoagulation,      retinopexy for the chronic and old retinal detachment.  2. Removal of posterior implant, with oil extractor as well as with      phagofragmatome to the posterior surface of the oil adherent to the      intraocular lens.  3. Intraocular lens repositioning with paracentesis incision, right      eye.   SURGEON:  Alford Highland. Rankin, M.D.   ANESTHESIA:  Local retrobulbar anesthesia control.   INDICATIONS FOR PROCEDURE:  The patient is a 76 year old woman who has  profound vision loss after complex retinal detachment repair on the  right eye some 2 years previously.  This is an attempt to remove the oil  so that the patient can regain best peripheral vision functioning in  this right eye.  She understands the risk of anesthesia including risk  of death, but also to the eye for the condition, as well as surgical  repair including but not limited to hemorrhage, infection, snoring, need  for further surgery, no change in vision, loss of vision and progressive  disease despite intervention.  Appropriate signed consent was obtained.   The patient was taken to the  operating room.  In the operating room  appropriate monitors  followed by mild sedation.  Marcaine 0.75%  delivered 5 mL retrobulbar and an additional 5 mL later and modified Darel Hong.  The right periocular region was sterilely prepped and draped in  the usual sterile fashion.  A lid speculum was applied.  At this time 25-  gauge trocar was then placed in the inferotemporal quadrant.  Infusion  turned on.  Superior nasal trocar applied.  Cutdown conjunctival opening  was made superotemporal.  A 20-gauge MVR blade used so as to allow the  silicon oil extraction to be placed.  Silicone oil extraction was  carried out with an 18-gauge Silastic tip.  Oil was removed without  complication.  There was significant adherence of the oil to the  posterior surface of silicone lens.  At this time phacofragmentation was  then carried out to remove and mobilize this and the central visual  access was cleared.  Fluid/air exchange completed and then an air/fluid  exchange  completed so as to attempt to remove as much of the oil as  possible and also that that might have been in the recesses of the  posterior chamber at the iris interface.   Additional remnants of the oil to the lens were removed with the  fragmentome.  Laser photocoagulation placed 360 degrees for retinopexy  purposes.  At this time paracentesis incision made inferotemporally and  a cycle dialysis spatula was then placed across the anterior chamber to  reposition the intraocular lens posterior to the iris plane in the  capsular bag.  No complications occurred.  At this time superonasal  trocar was removed.  The superotemporal sclerotomy was closed with 7-0  Vicryl as was the conjunctiva.  The infusion which was placed in the  inferonasal quadrant actually because of better conjunctival mobility  was now removed.  All of the wounds were secured.  Subconjunctival  Decadron applied.  Sterile patch and Fox shield applied.  The patient   tolerated the procedure without complication and was taken to the  recovery room in good stable condition.      Alford Highland Rankin, M.D.  Electronically Signed     GAR/MEDQ  D:  03/19/2007  T:  03/20/2007  Job:  [id number]

## 2010-11-05 NOTE — Op Note (Signed)
NAME:  Melissa [name], Melissa [name]                ACCOUNT NO.:  [id number]   MEDICAL RECORD NO.:  [id number]          PATIENT TYPE:  AMB   LOCATION:  ENDO                         FACILITY:  [facility]   PHYSICIAN:  Georgiana Spinner, M.D.    DATE OF BIRTH:  June 01, 1935   DATE OF PROCEDURE:  09/16/2004  DATE OF DISCHARGE:                                 OPERATIVE REPORT   PROCEDURE:  Colonoscopy.   INDICATIONS:  Colon polyps.   ANESTHESIA:  Demerol 25 mg.   PROCEDURE:  With the patient mildly sedated in the left lateral decubitus  position, the Olympus videoscopic colonoscope was inserted in the rectum and  passed under direct vision cecum. identified by ileocecal valve and  appendiceal orifice, both of which were photographed.  From this point  colonoscope was slowly withdrawn, and withdrew taking circumferential views  of the colonic mucosa as we pulled back all the way to the rectum, stopping  first in the hepatic flexure area of colon, where a small polyp was seen,  photographed and removed using hot biopsy forceps technique, setting of  20/200 blended current.  We next stopped at the descending colon, where a  second polyp seen.  It, too,was removed using hot biopsy forceps technique,  same setting of 20/200 blended current.  There were diverticula seen in the  sigmoid colon and in the rectum, which appeared normal on direct and showed  hemorrhoids on retroflexed view.  The endoscope was straightened and  withdrawn.  The patient's vital signs and pulse oximetry remained stable.  The patient will procedure well without apparent complication.   FINDINGS:  1.  Internal hemorrhoids.  2.  Diverticulosis of sigmoid colon, rather mild.  3.  Polyps at descending colon and at the hepatic flexure.   Await biopsy report. The patient will call me for results and follow up with  me as an outpatient.      GMO/MEDQ  D:  09/16/2004  T:  09/16/2004  Job:  [id number]   cc:   Charlesetta Shanks  [address]  [place] [address]  Fax: [phone]

## 2010-11-05 NOTE — Op Note (Signed)
NAME:  Melissa [name], Melissa [name]                            ACCOUNT NO.:  [id number]   MEDICAL RECORD NO.:  [id number]                   PATIENT TYPE:  OIB   LOCATION:  5735                                 FACILITY:  [facility]   PHYSICIAN:  Guadelupe Sabin, M.D.             DATE OF BIRTH:  1935-06-11   DATE OF PROCEDURE:  11/08/2002  DATE OF DISCHARGE:                                 OPERATIVE REPORT   PREOPERATIVE DIAGNOSIS:  Rhegmatogenous retinal detachment, right eye,  single defect horseshoe tear, large, irregular.   POSTOPERATIVE DIAGNOSIS:  Rhegmatogenous retinal detachment, right eye,  single defect horseshoe tear, large, irregular.   OPERATION:  Scleral buckling procedure, right eye, using solid silicone  implants, #240 and 279, cryoapplication, diathermy application, external  drainage of subretinal fluid.   SURGEON:  Guadelupe Sabin, M.D.   ASSISTANT:  Nurse.   ANESTHESIA:  General.   OPHTHALMOSCOPY:  As previously described.   DESCRIPTION OF PROCEDURE:  After the patient was prepped and draped,  lid  traction sutures were placed of 4-0 silk in the right upper and lower lids.  A lid speculum was inserted.  A peritomy was performed adjacent to the  limbus 360 degrees. The subconjunctival tissue was cleaned, and the rectus  muscles identified and isolated with 4-0 silk traction sutures.  Due to the  superior position of the tear, it was elected to detach temporarily the  superior rectus muscle.  The muscle was cut from its anatomic insertion, and  a 4-0 chromic catgut suture placed in its tendon.  A 4-0 silk was then  placed in the tendon insertion in the sclera for traction purposes.  Detailed indirect ophthalmoscopy was then performed revealing the bullous  rhegmatogenous retinal detachment with a large, irregular horseshoe tear at  the 10 to 10:30 position.  The tear itself was treated with direct  cryoapplication.  It was then elected to perform lamellar sclera dissection  from the 9:30 to 1:30 position.  The bed measured 11 mm in width.  The  sclera was noted to be rather thin with scleral dehiscences, but these were  not perforated.  It was then elected to insert three 4-0 green [place]ilene  sutures with 3-0 plain catgut reinforcements at the 9:30, 11, and 12:30  positions.  Diathermy applications were applied to the intrascleral lamella  which had been dissected from the 1:30 to 10 o'clock position.  The  thin  sclera at the 9 o'clock to 10 o'clock position contraindicated scleral  lamellar dissection.  After placing the sutures, it was elected to place a  240 encircling silicone band around the globe, tied with two sutures at the  8 o'clock position.  Anchoring sutures of 5-0 white Dacron were placed at  the 7:30 and 4 o'clock positions to hold the encircling band in place..  After repeat indirect ophthalmoscopy, it was elected to drain fluid in the  bed  at the 11:30 position.  Incision was made through the intrascleral  lamella which was treated with light diathermy and then perforated with the  pen electrode.  An abundant amount of clear subretinal fluid drained.  The  scleral flaps were pulled up, and the fundus inspected after 1 ml of  filtered air was injected to reestablish the intraocular pressure which was  quite hypotonus.  The tear appeared to be in satisfactory position at the  10:30 position, although still slightly elevated on the buckle.  A small  amount of subretinal fluid remained superiorly, but the superior buckle was  smooth.  It was then elected to close, feeling that the subretinal fluid  would absorb spontaneously with the intravitreal air tamponade and scleral  buckling indentation.  The superior rectus muscle was reattached to its  anatomic insertion.  Tenon's capsule was pulled forward in the four  quadrants and tied as a separate layer with a 6-0 chromic catgut suture.  The conjunctiva was then pulled forward and closed with a  running 6-0  chromic catgut suture.  Neosporin ophthalmic solution has been irrigated in  the subtenon space toward the end of the operative procedure.  Depo-  Garamycin and dexamethasone were injected in the subtenon space inferiorly.  Maxitrol ointment was instilled in the conjunctival cul-de-sac.  A light  patch and protector shield were applied.  Duration of procedure 1-1/2 hours.  The patient tolerated the procedure well in general and left the operating  room for the recovery room after a cryopexy had been performed in the left  eye.                                               Guadelupe Sabin, M.D.    HNJ/MEDQ  D:  11/10/2002  T:  11/10/2002  Job:  [id number]

## 2010-11-05 NOTE — H&P (Signed)
NAME:  Melissa [name], Melissa [name] NO.:  [id number]   MEDICAL RECORD NO.:  [id number]                   PATIENT TYPE:  OIB   LOCATION:  6705                                 FACILITY:  [facility]   PHYSICIAN:  Guadelupe Sabin, M.D.             DATE OF BIRTH:  [birth year]   DATE OF ADMISSION:  09/05/2003  DATE OF DISCHARGE:  09/06/2003                                HISTORY & PHYSICAL   This was a planned outpatient readmission of this 76 year old white female  admitted for additional vitrectomy surgery of the right eye.   PRESENT ILLNESS:  This patient was noted to have a complex retinal  detachment of the right eye and a retinal tear of the left eye.  She was  admitted on Nov 08, 2002 for a complex scleral buckling and vitrectomy with  intraocular Perfluoron administration right eye, a cryopexy was performed on  the left eye.  Later additional laser photocoagulation was applied to the  right eye in the office in August 2004.  Patient did well with retinal  reattachment but had residual Perfluoron bubbles from the complex vitrectomy  surgery.  Patient has now elected to have these bubbles removed and to have  a repair of an iris capture syndrome/intraocular lens syndrome.  The patient  was given oral discussion and printed information concerning the procedure  and its possible complications.  She signed an informed consent and  arrangements were made for her outpatient admission at this time.   PAST MEDICAL HISTORY:  Patient is under the care of her regular physician.  She is felt to be in satisfactory condition for the proposed surgery.   REVIEW OF SYSTEMS:  No cardiorespiratory complaints.   PHYSICAL EXAMINATION:  VITAL SIGNS AS RECORDED ON ADMISSION:  Blood pressure  114/70, pulse 91, respirations 16, temperature 97.8.  GENERAL APPEARANCE:  Patient is a pleasant 76 year old white female in no  acute distress.  HEENT:  Eyes - Visual acuity with best correction  20/100 right eye, 20/40  left eye.  Applanation tonometry 18 mm each eye.  Slit lamp examination -  The eyes are white and  clear with a clear cornea, there is iris capture of  the intraocular lens which is in front of and behind the iris with adherence  of the iris to the posterior lens capsule which is open  centrally with a  small aperture, there appears to be small tiny bubbles of Perfluoron  adherent to the intraocular lens implant.  Dilated fundus examination right  eye reveals the vitreous to be clear with multiple Perfluoron bubbles at the  macula, posterior pole and optic nerve, a good peripheral scleral buckling  indentation is present and there is no areas of retinal tearing or retinal  detachment, the macula shows a suspicious macular hole but there are bubbles  of Perfluoron in the center or fovea of the macula making this determination  difficult; the left eye shows a clear vitreous and old cryo scar from her  cryopexy surgery.  CHEST:  Lungs clear to percussion and auscultation.  Heart normal sinus  rhythm; no cardiomegaly; no murmurs.  ABDOMEN:  Negative.  EXTREMITIES:  Negative.   ADMISSION DIAGNOSES:  1. Status post vitrectomy surgery with Perfluoron fluid.  2. Pseudophakia.   OPERATION:  Planned repeat vitrectomy with removal of Perfluoron fluid and  repair of iris capture.                                                Guadelupe Sabin, M.D.    HNJ/MEDQ  D:  09/05/2003  T:  09/08/2003  Job:  [id number]

## 2010-11-05 NOTE — Op Note (Signed)
NAME:  Melissa [name], Melissa [name]                            ACCOUNT NO.:  [id number]   MEDICAL RECORD NO.:  [id number]                   PATIENT TYPE:  OIB   LOCATION:  5735                                 FACILITY:  [facility]   PHYSICIAN:  Guadelupe Sabin, M.D.             DATE OF BIRTH:  [birth year]   DATE OF PROCEDURE:  11/10/2002  DATE OF DISCHARGE:                                 OPERATIVE REPORT   PREOPERATIVE DIAGNOSIS:  Persistent rhegmatogenous retinal detachment, right  eye.   POSTOPERATIVE DIAGNOSIS:  Persistent rhegmatogenous retinal detachment,  right eye.   OPERATION:  Pars plana vitrectomy using vitreous infusion suction  cutter,vitreous Perfluoron, air 10% C3F8 injection.   SURGEON:  Guadelupe Sabin, M.D.   ASSISTANT:  Nurse.   ANESTHESIA:  General.   OPHTHALMOSCOPY:  As previously described.   DESCRIPTION OF PROCEDURE:  After the patient was prepped and draped, a lid  speculum was inserted in the right eye.  The line of the previous peritomy  was reopened over the superior 180 degrees from approximately the 8 to 2:30  position.  The subconjunctival tissue was cleaned,and the previously placed  sutures of 6-0 chromic catgut were removed.  The dissection was carried back  to 3.5 to 4 to 5 mm.  Three sclerotomy sites were then prepared at the 8,  10, and 2 o'clock positions, 3.5 mm from the limbus using the 20-gauge MVR  blade.  The 4 mm vitreous infusion terminal was secured in place with a  preplaced 5-0 white mattress Dacron suture at the 8 o'clock position.  The  fiberoptic light pipe was inserted at the 2 o'clock position, and the hand  piece of the vitreous infusion suction cutter at the 10 o'clock position.  Slow vitreous infusion suction cutting were begun.  The vitreous was  extremely fibrinous.  There appeared to be some vitreous hemorrhage which  appeared to arise from the 10 o'clock sclerotomy site and entered the  anterior chamber on the surface of the  intraocular lens implant.  This  prevented detailed ophthalmoscopy.  It was then elected to insert a 20-guage  Lewicky anterior chamber maintainer to flush the anterior chamber.  A limbal  corneal incision was made with the 20-gauge MVR blade, depressing the  posterior lip of the incision.  The anterior chamber was infused and drained  with the associated anterior chamber hemorrhage.  Following flushing, the  anterior chamber remained relatively clear without clotting on the anterior  surface of the posterior chamber intraocular lens implant.  It was then  elected to reinsert the vitreous instruments, and continued aspiration of  the fibrinous vitreous material continued.  The large tractional flap on the  retinal tear at the 10 o'clock to 10:30 position was noted, and the vitreous  severed from its attachment on the tear flap.  It was then elected to  perform fluid Perfluoron exchange; 5 ml of  Perfluoron were instilled,  flattening the retina.  It was then elected to infuse air anteriorly through  the sclerotomy site, and the Perfluoron was aspirated.  There was some  difficulty with ophthalmoscopy as there was some condensation on the center  of the posterior chamber intraocular lens implant, although the capsule  appeared to be intact.  Following the installation of the air, the fundus  was again inspected.  The retina was found to be flattened completely with a  good buckling indentation noted.  The intraocular endolaser photocoagulator  was assembled, and approximately 20 applications were applied around the  area of the retinal tear with some difficulty as ophthalmoscopy was again  hazy.  It was then elected to close, and the vitreous instruments were  withdrawn.  The air was then exchanged for a mixture of 10% C3F8 which was  flushed through the eye, and then the sclerotomy sites closed.  The infused  air was let out of the 2 o'clock sclerotomy site as the 10 o'clock position   appeared to be sealed with no leakage of the air.  All sclerotomy sites were  closed with a 7-0 Vicryl suture.  The tenon's capsule was then pulled  forward in the open quadrants and tied with a separate 6-0 chromic catgut  suture.  Conjunctiva was then closed with a running 7-0 Vicryl suture. Depo-  Garamycin and Celestone were injected in the subtenon space inferiorly.  Maxitrol and atropine ointment were instilled in the conjunctival cul-de-  sac.  Schiotz tonometry at the end of the procedure was recorded at 18 scale  units with a 5.5 g weight, indicating a relatively hypotensive eye.  The  patient was placed on her left side until face-down positioning could be  instituted.  Duration of the procedure:  1-1/2 hours.  The patient tolerated  the procedure well in general and left the operating room for the recovery  room in good condition.                                               Guadelupe Sabin, M.D.    HNJ/MEDQ  D:  11/10/2002  T:  11/10/2002  Job:  [id number]

## 2010-11-05 NOTE — Op Note (Signed)
NAME:  Melissa [name], Melissa [name]                ACCOUNT NO.:  [id number]   MEDICAL RECORD NO.:  [id number]          PATIENT TYPE:  AMB   LOCATION:  ENDO                         FACILITY:  [facility]   PHYSICIAN:  Georgiana Spinner, M.D.    DATE OF BIRTH:  1934-11-08   DATE OF PROCEDURE:  09/16/2004  DATE OF DISCHARGE:                                 OPERATIVE REPORT   PROCEDURE:  Upper endoscopy.   INDICATIONS:  GERD.   ANESTHESIA:  Demerol 75, Versed 7 mg.   PROCEDURE:  With the patient mildly sedated in the left lateral decubitus  position, the Olympus videoscopic endoscope was inserted in the mouth and  passed under direct vision through the esophagus, which appeared normal,  into the stomach.  Fundus, body, antrum, duodenal bulb, second portion of  duodenum all appeared normal. From this point the endoscope was slowly  withdrawn taking circumferential views of duodenal mucosa until the  endoscope had been pulled back in the stomach, placed in retroflexion to  view the stomach from below and hiatal hernia was seen. The scope was  straightened and withdrawn taking circumferential views of the remaining  gastric and esophageal mucosa. The patient's vital signs and pulse oximeter  remained stable. The patient tolerated procedure well without apparent  complications.   FINDINGS:  Hiatal hernia. Otherwise unremarkable examination.   PLAN:  Proceed to colonoscopy.      GMO/MEDQ  D:  09/16/2004  T:  09/16/2004  Job:  [id number]   cc:   [employer].

## 2010-11-05 NOTE — Op Note (Signed)
NAME:  Melissa [name], Melissa [name] NO.:  [id number]   MEDICAL RECORD NO.:  [id number]          PATIENT TYPE:  AMB   LOCATION:  ENDO                         FACILITY:  [facility]   PHYSICIAN:  Georgiana Spinner, M.D.    DATE OF BIRTH:  1935-03-20   DATE OF PROCEDURE:  DATE OF DISCHARGE:                               OPERATIVE REPORT   PROCEDURE:  Colonoscopy.   INDICATION:  Colon polyps.   ANESTHESIA:  Fentanyl 75 mcg, Versed 7.5 mg.   DESCRIPTION OF PROCEDURE:  With the patient mildly sedated in the left  lateral decubitus position, a rectal examination was performed which was  unremarkable.  Subsequently, the Pentax videoscopic colonoscope was  inserted into the rectum and passed under direct vision to the cecum  identified by the ileocecal valve and appendiceal orifice, both of which  were photographed.  From this point, the colonoscope was slowly  withdrawn, taking circumferential views of the colonic mucosa, stopping  to photograph diverticula seen in the sigmoid colon until we reached the  rectum which appeared normal on direct, showed hemorrhoids on  retroflexed view.  The endoscope was straightened and withdrawn.  The  patient's vital signs and pulse oximetry remained stable.  The patient  tolerated the procedure well with no apparent complications.   FINDINGS:  Internal hemorrhoids and diverticulosis of the sigmoid colon,  moderately severe.   PLAN:  Have the patient follow up with me in 5 years or as needed.           ______________________________  Georgiana Spinner, M.D.     GMO/MEDQ  D:  10/12/2006  T:  10/12/2006  Job:  [id number]   cc:   Evelena Peat, M.D.

## 2010-11-05 NOTE — Op Note (Signed)
NAME:  DELIGHT, BICKLE                            ACCOUNT NO.:  [id number]   MEDICAL RECORD NO.:  [id number]                   PATIENT TYPE:  OIB   LOCATION:  2869                                 FACILITY:  [facility]   PHYSICIAN:  Alford Highland. Rankin, M.D.                DATE OF BIRTH:  07-Jul-1934   DATE OF PROCEDURE:  11/10/2003  DATE OF DISCHARGE:  11/10/2003                                 OPERATIVE REPORT   PREOPERATIVE DIAGNOSES:  1. Proliferative vitreoretinopathy, stage D.  2. Recurrent traction and rhegmatogenous retinal detachment to the right     eye.   POSTOPERATIVE DIAGNOSES:  1. Proliferative vitreoretinopathy, stage D.  2. Recurrent traction and rhegmatogenous retinal detachment to the right     eye.  3. Macular hole, OD.   PROCEDURES:  1. Posterior vitrectomy with membrane peel, internal limiting membrane.  2. Endolaser panphotocoagulation for retinopexy purposes and repair of     retinal detachment.  3. Injection of vitreous substitute, air-fluid exchange.  4. Injection of silicone oil permanent vitreous substitute, 5000     [place], OD.   SURGEON:  Alford Highland. Rankin, M.D.   ANESTHESIA:  Local retrobulbar with monitored anesthesia control.   INDICATION FOR PROCEDURE:  The patient is a 76 year old woman who has  recurrent proliferative vitreoretinopathy inferiorly, combined traction and  rhegmatogenous retinal detachment.  This is an attempt to reattach the  retina so as to preserve best-functioning ambulatory vision in this eye.  She understands the limitations of our control of the postoperative scarring  in this type of an eye.  She understands the risks of anesthesia, including  the rare occurrence of death, and losses to the eye, including but not  limited to hemorrhage, infection, scarring, need for further surgery, no  change in vision, loss of vision, and progressive disease despite  intervention.  Appropriate signed consent was obtained.  The patient taken  to  the operating room.   In the operating room appropriate monitoring was followed by mild sedation.  Marcaine 0.75% delivered 5 mL retrobulbar followed by an additional 5 mL  lateral in the fashion of modified Darel Hong.  The right periocular region  was sterilely prepped and draped in the usual ophthalmic fashion.  A  conjunctival peritomy was then fashioned in each of the quadrants with the  exception of the inferonasal.  A 4 mm infusion was secured 3.5 mm posterior  to the limbus in the inferotemporal quadrant.  Placement in the vitreous  cavity verified visually.  A superior sclerotomy was then fashioned, the  wall microscope placed in position with the BIOM attached.  Core vitrectomy  was then begun.  A core vitrectomy had been previously done.  A star fold  was engaged at the 6:30 position, and this was elevated to remove a pucker  over the peripheral retina.  The remainder of the vitreous base was engaged,  elevated  as far as it could be mobilized anteriorly by manual dissection  technique.  At this time a Rice pick was then used to engage the internal  limiting membrane adjacent to the macular region to see if there were any  remnants left.  There were no remnants left from the parafoveal region, and  there were scattered remnants approximately one disc diameter away from this  temporally, and this was elevated to further decrease traction in this area.   The remainder of the retina was excellently mobile.  A fluid-air exchange  was then completed passively.  Drainage was carried out through the macular  hole.  Endolaser photocoagulation was then placed in the slip of the buckle  360 degrees as well as posterior to the slip of the buckle 360 degrees and  posterior to previous retinopexy that had been placed temporally.   A decision was made to tack down the temporomacular region temporal to the  macular hole so as to minimize its chance of causing recurrent retinal  detachment.    At this time re-aspiration of reaccumulated fluid was carried out over 15-20  minutes as it reaccumulated.   Air-silicone oil exchange completed passively.  The superior sclerotomy was  closed with 7-0 Vicryl.  The infusion removed and similarly closed with 7-0  Vicryl.  Subconjunctival injections of antibiotic and steroid applied.  The  patient tolerated the procedure without complication.  The patient was taken  to the short stay area to be discharged home as an outpatient.                                               Alford Highland Rankin, M.D.    GAR/MEDQ  D:  11/10/2003  T:  11/11/2003  Job:  [id number]

## 2010-11-05 NOTE — Op Note (Signed)
   NAME:  Melissa [name], LEIST                            ACCOUNT NO.:  [id number]   MEDICAL RECORD NO.:  [id number]                   PATIENT TYPE:  OIB   LOCATION:  5735                                 FACILITY:  [facility]   PHYSICIAN:  Guadelupe Sabin, M.D.             DATE OF BIRTH:  1935/05/14   DATE OF PROCEDURE:  11/08/2002  DATE OF DISCHARGE:                                 OPERATIVE REPORT   PREOPERATIVE DIAGNOSIS:  Peripheral retinal degeneration, lattice type.   POSTOPERATIVE DIAGNOSIS:  Peripheral retinal degeneration, lattice type.   OPERATION:  Transconjunctival cryopexy.   SURGEON:  Guadelupe Sabin, M.D.   ASSISTANT:  Nurse.   ANESTHESIA:  General.   OPHTHALMOSCOPY:  As previously described and drawn.   DESCRIPTION OF PROCEDURE:  After the patient was prepped and draped, a lid  speculum was inserted in the left dilated eye.  Using a transconjunctival  cryoprobe and indirect ophthalmoscopy, the two areas of peripheral retinal  degeneration at the 1 o'clock and 5:30 positions were localized and treated  with direct transconjunctival cryopexy.  Good retinal reaction could be seen  around each treated area.  It was then elected to close.  Maxitrol ointment  was instilled in the conjunctival cul-de-sac only of the left eye.  No patch  was applied to the operated left eye.  Duration of the procedure:  30  minutes.  The patient tolerated the procedure well in general and left the  operating room for the recovery room in good condition.                                               Guadelupe Sabin, M.D.    HNJ/MEDQ  D:  11/10/2002  T:  11/10/2002  Job:  [id number]

## 2010-11-05 NOTE — H&P (Signed)
Melissa [name], Melissa [name]                            ACCOUNT NO.:  [id number]   MEDICAL RECORD NO.:  [id number]                   PATIENT TYPE:  OIB   LOCATION:  6705                                 FACILITY:  [facility]   PHYSICIAN:  Guadelupe Sabin, M.D.             DATE OF BIRTH:  1935/02/21   DATE OF ADMISSION:  09/05/2003  DATE OF DISCHARGE:  09/06/2003                                HISTORY & PHYSICAL   PREOPERATIVE DIAGNOSES:  1. Residual Perfluoron fluid after vitrectomy surgery right eye.  2. Iris capture right eye.   POSTOPERATIVE DIAGNOSES:  1. Residual Perfluoron fluid after vitrectomy surgery right eye.  2. Iris capture right eye.   NAME OF OPERATION:  Repeat posterior vitrectomy right eye with removal of  Perfluoron fluid, repair of iris capture syndrome.   SURGEON:  Guadelupe Sabin, M.D.   ASSISTANT:  Nurse.   ANESTHESIA:  General.   OPHTHALMOSCOPY:  As previously described.   OPERATIVE PROCEDURE:  After the patient was prepped and draped a lid  speculum was inserted in the right eye.  The eye was turned downward and a  superior rectus traction suture placed.  A peritomy was performed adjacent  to the limbus from the 8 to 2 o'clock position superiorly.  The  subconjunctival tissue was cleaned and three sclerotomies sites prepared at  the 10, 2, and 8:30 position 3.5 mm from the limbus.  The 4 mm vitreous  infusion terminal was secured in place at the 8:30 position with a 5-0 green  Mersilene suture.  The fiberoptic light pipe was inserted at the 2 o'clock  position and handpiece of the vitreous infusion suction cutter at the 10  o'clock position.  The ocular microscope was aligned.  The irrigating  contact lens was applied.  Slow vitreous infusion suction cutting were begun  from an anterior to posterior direction.  Tiny bubbles of Perfluoron were  aspirated from the posterior surface of the intraocular lens implant.  As  the retina was approached there appeared to  be multiple bubbles covering the  optic nerve, macula, and posterior retina.  These were aspirated with the  handpiece of the vitreous infusion suction cutter and the Dutch tip  applicator.  After most of the bubbles had been removed there appeared to be  residual bubbles in the macular foveal area.  These were gently aspirated  with the Dutch tip applicator.  Attention was then paid to the anterior  segment where the posterior surface of the intraocular lens implant was  aspirated with the [place] tip aspirator.  The lens appeared to be partially  in the anterior chamber with adherence of the iris to the residual open  posterior capsule.  Incision was made into the cornea at the 11 o'clock  position with the MVR blade.  Using the [facility] cataract hook both in the  anterior chamber and in the posterior chamber coming from  behind the iris  adherence to the posterior capsule was cut.  Some of the posterior capsule  was aspirated with the hand piece of the vitreous infusion suction cutter to  complete the adherence problem.  The lens appeared to be safely in place.  A  tiny iridotomy was performed at the 6 o'clock position to prevent pupillary  block.  Miochol ophthalmic solution was irrigated in the anterior chamber to  stimulate pupillary constriction and the intraocular lens implant appeared  to be in good position behind the now round pupil.  It was then elected to  close.  A 10-0 interrupted nylon suture was placed across the tiny corneal  incision at the 11 o'clock position, the three sclerotomy sites were closed  with 7-0 Vicryl, the conjunctiva was closed with 7-0 Vicryl.  Maxitrol  ointment was instilled in the conjunctival cul-de-sac.  A light patch and  protector shield were applied.  Duration of procedure 1 hour.  Patient  tolerated procedure well in general and left the operating room for the  recovery room in good condition.                                                Guadelupe Sabin, M.D.    HNJ/MEDQ  D:  09/05/2003  T:  09/08/2003  Job:  [id number]   cc:   Molly Maduro L. Dione Booze, M.D.  [phone]  [place]  [place] [address]  Fax: [phone]

## 2010-11-05 NOTE — Op Note (Signed)
NAME:  Melissa [name], Melissa [name]                            ACCOUNT NO.:  [id number]   MEDICAL RECORD NO.:  [id number]                   PATIENT TYPE:  OIB   LOCATION:  2899                                 FACILITY:  [facility]   PHYSICIAN:  Alford Highland. Rankin, M.D.                DATE OF BIRTH:  16-Sep-1934   DATE OF PROCEDURE:  10/08/2003  DATE OF DISCHARGE:  10/08/2003                                 OPERATIVE REPORT   PREOPERATIVE DIAGNOSIS:  1. Rhegmatogenous retinal detachment of the right eye, macula off.  2. Proliferative vitreoretinopathy, stage C2, status post previous repair     attempt via scleral buckle and later on with vitrectomy using vitreous     substitute injections.  3. After vitreous substitute removal by Dr. Cecilie Kicks.  4. Macular hole found on examination as well as confirmed by OCT evaluation     of the right eye.   POSTOPERATIVE DIAGNOSIS:  1. Rhegmatogenous retinal detachment of the right eye, macula off.  2. Proliferative vitreoretinopathy, stage C2, status post previous repair     attempt via scleral buckle and later on with vitrectomy using vitreous     substitute injections.  3. After vitreous substitute removal by Dr. Cecilie Kicks.  4. Macular hole found on examination as well as confirmed by OCT evaluation     of the right eye.   OPERATION PERFORMED:  1. Posterior vitrectomy membrane peel, macula, internal limiting membrane,     right eye.  2. Posterior vitrectomy with panphotocoagulation, right eye for retinopexy     purposes.  3. Injection of vitreous substitute--C3F8 10%.  4. Removal of nonmagnetic foreign body of right eye--Perfluoron and removal     of Perfluoron and silicone from previous surgery noted.   SURGEON:  Alford Highland. Rankin, M.D.   ANESTHESIA:  General endotracheal.   INDICATIONS FOR PROCEDURE:  The patient is a 76 year old woman who has  profound vision loss on the basis of recurrent retinal detachment, macula  off, proliferative vitreoretinopathy, stage C2  and macular hole in the right  eye.  The patient understands this is an attempt to repair the retina to  release traction on the macular hole and to allow for visual acuity  improvement after placement of vitreous substitute to reattach the retina.  The patient understands the risks of anesthesia including the rare  occurrence of death but also to the eye including but not limited to  hemorrhage, infection, scarring, need for another surgery, no change in  vision, loss of vision and progression of disease despite intervention.   DESCRIPTION OF PROCEDURE:  After appropriate signed consent was obtained,  the patient was taken to the operating room.  General endotracheal  anesthesia  instituted without difficulty.  The right periocular region was sterilely  prepped and draped in the usual ophthalmic fashion.  A lid speculum was  applied.  Conjunctival peritomy was fashioned in each of the  quadrants with  the exception of infranasal.  A 4 mm infusion secured 3.5 mm posterior to  the limbus in the infratemporal quadrant.  Placement in the vitreous cavity  verified visually.  Superior sclerotomies then fashioned.  Wild microscope  was placed in position with the Biom attachment.  Core vitrectomy was not  necessary, had been previously done.  There were significant vitreous  remnants centered between the 12 and 2 o'clock position as well as between  the 4 to 7:30 position.  Scleral depression was then used to trim this  vitreous base further.  Retinal detachment extended from the 10 o'clock  position to the 2 o'clock position extending posterior to the macular region  and extended to the subfoveal area with macular hole noted.  At this time  fluid-fluid exchange carried out through the macular hole.  Internal  limiting membrane was grasped with Rice pick and removed in a continuous  tear fashion around the edge of the macular hole using internal limiting  membrane forceps.  At this time the  fluid-air exchange was then completed to  reattach the retina.  Drainage was carried out through the macular hole  because of its location at the base of its attachment.  At this time  endolaser photocoagulation was placed superiorly and in the midperiphery  superiorly to protect from retinal redetachment.  Excellent laser  photocoagulation over the slope of the previously placed buckle was applied  as well as posterior to the slope of the buckle.  The instruments were  removed from the eye.  Superior sclerotomy closed.  Air-C3F8 10% exchange  completed.  The infusion removed and similarly closed with 7-0 Vicryl.  Conjunctiva closed with 7-0 Vicryl.  Subconjunctival injection of antibiotic  applied as steroid was not available.  The patient had sterile patch and Fox  shield applied.  Intraocular pressure was assessed and found to be adequate.  The patient tolerated the procedure well without complication. The patient  was then transferred to the recovery room in good and stable condition.                                               Alford Highland Rankin, M.D.    GAR/MEDQ  D:  10/08/2003  T:  10/09/2003  Job:  [id number]   cc:   Guadelupe Sabin, M.D.  [id number]  [place]  [place] [address]  Fax: [phone]

## 2010-11-05 NOTE — H&P (Signed)
NAME:  Melissa [name], Melissa [name]                            ACCOUNT NO.:  [id number]   MEDICAL RECORD NO.:  [id number]                   PATIENT TYPE:  OIB   LOCATION:  5735                                 FACILITY:  [facility]   PHYSICIAN:  Guadelupe Sabin, M.D.             DATE OF BIRTH:  March 10, 1935   DATE OF ADMISSION:  11/08/2002  DATE OF DISCHARGE:                                HISTORY & PHYSICAL   REASON FOR ADMISSION:  This was an urgent outpatient admission of this 76-  year-old white female admitted with a rhegmatogenous retinal detachment of  the right eye and peripheral retinal degeneration of the left eye.   HISTORY OF PRESENT ILLNESS:  This patient had previous uneventful cataract  implant surgery of the right eye performed by Dr. Doloris Hall in February  2003.  The patient recently began to note the onset of floating spots in the  operated right eye on 11/02/02, followed by lightening flashes and a  subsequent visual field defect. The patient was seen by Dr. Ernesto Rutherford who  made the diagnosis of a rhegmatogenous retinal detachment. The patient was  referred to my office where this diagnosis was confirmed and arrangements  made for her outpatient admission.   PAST MEDICAL HISTORY:  The patient is in stable general health under the  care of Dr. Lucretia Roers in [place], [place].  The patient states  that she has non-insulin dependent diabetes mellitus and currently is taking  oral medication. The patient does not check her blood sugar regularly but  when she does, it is said to be satisfactory. The patient was last seen by  Dr. Fayne Mediate in March 2004. The patient was noted at that time to have  increased cholesterol and a gastric reflux syndrome. The patient currently  has been taking aspirin 81 mg, Nexium and Glucophage.   SOCIAL HISTORY:  The patient does not smoke and does not ingest alcoholic  preparations or other drugs.   ALLERGIES:  The patient has no known  allergies.   REVIEW OF SYSTEMS:  No cardiorespiratory complaints.   PHYSICAL EXAMINATION:  VITAL SIGNS: As recorded on admission, blood pressure  135/65, respirations 18, heart rate 110, temperature 97.  GENERAL: The patient is a pleasant, well nourished, well developed, 68-year-  old white female in acute ocular distress.  HEENT:  Eyes, visual acuity without correction light perception right eye  and 20/25 left eye.  Applanation tonometry 16 mm right eye, 17 left eye.  External ocular and slit lamp examination, the eyes are white and clear with  a clear cornea deep and clear anterior chamber. A clear posterior chamber  intraocular lens implant is present in the right eye.  The posterior capsule  appears relatively clear and intact. In the left eye, patient has a clear  cornea deep and clear anterior chamber and moderate nuclear cataract  formation. Pupils are round and reactive.  Ocular motility normal.  Detailed  fundus examination dilated with Medrafil with retinal drawings: Right eye,  the vitreus is clear. There is a bullous rhegmatogenous retinal detachment  with a very large, irregular horseshoe tear at the 10:30 position.  The  bullous retinal detachment overhangs and involves the macular area.  No  other retinal tears are seen.  In the left eye, the vitreus is clear.  The  retina is attached. Optic nerve is sharply outlined of good color, disk cup  ratio 0.4.  The peripheral retina of the left eye shows atrophic  degenerative areas, lattice type, at the 1:30 position and at the 5:30  position.  A small retinal pigment epithelial defect is noted adjacent to  the optic nerve in the upper nasal quadrant.  The macular area is clear and  detached.  CHEST:  Lungs clear to percussion/auscultation.  HEART:  Normal sinus rhythm. No cardiomegaly. No murmurs.  ABDOMEN:  Negative.  EXTREMITIES:  Negative.   ADMISSION DIAGNOSES:  1. Rhegmatogenous retinal detachment right eye.  2.  Peripheral retinal degeneration, lattice type, left eye.   SECONDARY DIAGNOSIS:  Non-insulin dependent diabetes mellitus.   SURGICAL PLAN:  Scleral buckling procedure right eye with possible  vitrectomy and prophylactic transconjunctival cryopexy to peripheral retinal  degeneration areas.  The patient has been given oral discussion and printed  information concerning the procedures and possible complications.  She  signed an informed consent and arrangements made for her outpatient  admission at this time.                                               Guadelupe Sabin, M.D.    HNJ/MEDQ  D:  11/10/2002  T:  11/10/2002  Job:  [id number]   cc:   Molly Maduro L. Dione Booze, M.D.  [phone] [address]  [place]  [place] [address]  Fax: [phone]

## 2010-12-29 ENCOUNTER — Ambulatory Visit: Payer: Self-pay | Admitting: Family Medicine

## 2010-12-30 ENCOUNTER — Ambulatory Visit: Payer: Self-pay | Admitting: Family Medicine

## 2010-12-31 ENCOUNTER — Other Ambulatory Visit: Payer: Self-pay | Admitting: *Deleted

## 2010-12-31 DIAGNOSIS — K219 Gastro-esophageal reflux disease without esophagitis: Secondary | ICD-10-CM

## 2010-12-31 MED ORDER — ESOMEPRAZOLE MAGNESIUM 40 MG PO CPDR: 40.0000 mg | DELAYED_RELEASE_CAPSULE | Freq: Every day | ORAL | Status: DC

## 2010-12-31 NOTE — Telephone Encounter (Signed)
Pt needs return 6 month ROV

## 2011-01-12 ENCOUNTER — Encounter: Payer: Self-pay | Admitting: Family Medicine

## 2011-01-13 ENCOUNTER — Ambulatory Visit (INDEPENDENT_AMBULATORY_CARE_PROVIDER_SITE_OTHER): Payer: Medicare Other | Admitting: Family Medicine

## 2011-01-13 ENCOUNTER — Encounter: Payer: Self-pay | Admitting: Family Medicine

## 2011-01-13 DIAGNOSIS — G4700 Insomnia, unspecified: Secondary | ICD-10-CM

## 2011-01-13 DIAGNOSIS — K219 Gastro-esophageal reflux disease without esophagitis: Secondary | ICD-10-CM

## 2011-01-13 DIAGNOSIS — E785 Hyperlipidemia, unspecified: Secondary | ICD-10-CM

## 2011-01-13 DIAGNOSIS — E119 Type 2 diabetes mellitus without complications: Secondary | ICD-10-CM

## 2011-01-13 LAB — HEPATIC FUNCTION PANEL
ALT: 19 U/L (ref 0–35)
AST: 22 U/L (ref 0–37)
Alkaline Phosphatase: 59 U/L (ref 39–117)
Bilirubin, Direct: 0.1 mg/dL (ref 0.0–0.3)
Total Bilirubin: 0.5 mg/dL (ref 0.3–1.2)
Total Protein: 7.8 g/dL (ref 6.0–8.3)

## 2011-01-13 LAB — GLUCOSE, POCT (MANUAL RESULT ENTRY): POC Glucose: 103

## 2011-01-13 LAB — BASIC METABOLIC PANEL
BUN: 20 mg/dL (ref 6–23)
CO2: 25 mEq/L (ref 19–32)
Calcium: 9.1 mg/dL (ref 8.4–10.5)
Creatinine, Ser: 0.8 mg/dL (ref 0.4–1.2)
GFR: 79.02 mL/min (ref >60.00)
Glucose, Bld: 100 mg/dL — ABNORMAL HIGH (ref 70–99)

## 2011-01-13 LAB — LIPID PANEL: Cholesterol: 193 mg/dL (ref 0–200)

## 2011-01-13 NOTE — Progress Notes (Signed)
  Subjective:    Patient ID: Melissa [name], female    DOB: [birth year], 76 y.o.   MRN: [id number]  HPI Followup medical problems including type 2 diabetes, hyperlipidemia and chronic insomnia. She continues to take alprazolam but not regularly for sleep difficulties. She has GERD symptoms controlled with Nexium. Diabetes has been very well controlled. Last A1c 7.0%. Does not monitor sugars at home. No hypoglycemia with exception of rare sweats and weakness. She has not confirmed hypoglycemia with these episodes. No symptoms of hyperglycemia.  Hyperlipidemia with history of poor control. History of poor compliance. Currently atorvastatin 20 mg daily with no myalgias no history of CAD or peripheral vascular disease. Nonsmoker. No complications of neuropathy or nephropathy from diabetes   Review of Systems  Constitutional: Negative for fatigue and unexpected weight change.  HENT: Negative for trouble swallowing.   Eyes: Negative for visual disturbance.  Respiratory: Negative for cough, chest tightness, shortness of breath and wheezing.   Cardiovascular: Negative for chest pain, palpitations and leg swelling.  Genitourinary: Negative for dysuria.  Musculoskeletal: Negative for back pain.  Neurological: Negative for dizziness, seizures, syncope, weakness, light-headedness and headaches.  Psychiatric/Behavioral: Negative for dysphoric mood.       Objective:   Physical Exam  Constitutional: She is oriented to person, place, and time. She appears well-developed and well-nourished.  HENT:  Right Ear: External ear normal.  Left Ear: External ear normal.  Mouth/Throat: Oropharynx is clear and moist.  Neck: Neck supple. No thyromegaly present.  Cardiovascular: Normal rate and regular rhythm.  Exam reveals no gallop.   Pulmonary/Chest: Effort normal and breath sounds normal. No respiratory distress. She has no wheezes. She has no rales. She exhibits no tenderness.  Musculoskeletal: She exhibits no  edema.       Feet reveal no skin lesions. Good distal foot pulses. Good capillary refill. No calluses. Normal sensation with monofilament testing   Neurological: She is alert and oriented to person, place, and time.  Psychiatric: She has a normal mood and affect. Her behavior is normal.          Assessment & Plan:  #1 type 2 diabetes. History of good control. Reassess A1c today. She is getting regular eye checks #2 hyperlipidemia. History of poor control. Reassess lipids and hepatic today #3 history of GERD stable #4 chronic intermittent insomnia sparing use of low dose alprazolam.

## 2011-01-14 NOTE — Progress Notes (Signed)
Quick Note:  No answer, no machine ______

## 2011-01-17 NOTE — Progress Notes (Signed)
Quick Note:  Labs sent to pt home asking pt to contact us with her correct home phone. The home phone number on her chart is the [employer] ______

## 2011-03-01 ENCOUNTER — Other Ambulatory Visit: Payer: Self-pay | Admitting: Family Medicine

## 2011-03-26 ENCOUNTER — Other Ambulatory Visit: Payer: Self-pay | Admitting: Family Medicine

## 2011-03-28 ENCOUNTER — Other Ambulatory Visit: Payer: Self-pay | Admitting: Family Medicine

## 2011-03-28 DIAGNOSIS — K219 Gastro-esophageal reflux disease without esophagitis: Secondary | ICD-10-CM

## 2011-03-28 MED ORDER — ESOMEPRAZOLE MAGNESIUM 40 MG PO CPDR: 40.0000 mg | DELAYED_RELEASE_CAPSULE | Freq: Every day | ORAL | Status: DC

## 2011-03-28 NOTE — Telephone Encounter (Signed)
Pt  Called req refill of NEXIUM 40 MG capsule 6 month supply to [facility] in [place].  Pt is needing this filled before Wednesday, because is leaving town.

## 2011-03-31 LAB — BASIC METABOLIC PANEL
BUN: 12
CO2: 22
Calcium: 9.1
GFR calc non Af Amer: >60
Glucose, Bld: 131 — ABNORMAL HIGH
Sodium: 140

## 2011-03-31 LAB — CBC
Hemoglobin: 13.4
MCHC: 33.7
Platelets: 337
RDW: 12.8

## 2011-03-31 LAB — APTT: aPTT: 26

## 2011-04-27 ENCOUNTER — Other Ambulatory Visit: Payer: Self-pay | Admitting: Family Medicine

## 2011-04-27 DIAGNOSIS — Z1231 Encounter for screening mammogram for malignant neoplasm of breast: Secondary | ICD-10-CM

## 2011-05-31 ENCOUNTER — Ambulatory Visit
Admission: RE | Admit: 2011-05-31 | Discharge: 2011-05-31 | Disposition: A | Discharge status: Home / Self Care | Payer: Medicare Other | Source: Ambulatory Visit | Attending: Family Medicine | Admitting: Family Medicine

## 2011-05-31 DIAGNOSIS — Z1231 Encounter for screening mammogram for malignant neoplasm of breast: Secondary | ICD-10-CM

## 2011-07-14 ENCOUNTER — Ambulatory Visit: Payer: Medicare Other | Admitting: Family Medicine

## 2011-07-19 ENCOUNTER — Ambulatory Visit (INDEPENDENT_AMBULATORY_CARE_PROVIDER_SITE_OTHER): Payer: Medicare Other | Admitting: Family Medicine

## 2011-07-19 ENCOUNTER — Encounter: Payer: Self-pay | Admitting: Family Medicine

## 2011-07-19 DIAGNOSIS — E785 Hyperlipidemia, unspecified: Secondary | ICD-10-CM

## 2011-07-19 DIAGNOSIS — K219 Gastro-esophageal reflux disease without esophagitis: Secondary | ICD-10-CM

## 2011-07-19 DIAGNOSIS — E119 Type 2 diabetes mellitus without complications: Secondary | ICD-10-CM

## 2011-07-19 LAB — MICROALBUMIN / CREATININE URINE RATIO: Microalb Creat Ratio: 1 mg/g (ref 0.0–30.0)

## 2011-07-19 MED ORDER — GLIMEPIRIDE 4 MG PO TABS: 4.0000 mg | ORAL_TABLET | Freq: Every day | ORAL | Status: DC

## 2011-07-19 MED ORDER — ATORVASTATIN CALCIUM 20 MG PO TABS: 20.0000 mg | ORAL_TABLET | Freq: Every day | ORAL | Status: DC

## 2011-07-19 NOTE — Progress Notes (Signed)
  Subjective:    Patient ID: Melissa [name], female    DOB: 07-23-1934, 77 y.o.   MRN: [id number]  HPI  Medical followup. Chronic problems include type 2 diabetes, hyperlipidemia, chronic insomnia, and GERD. Does not monitor blood sugars. Last A1c 7.1%. No hypoglycemic symptoms. She's been compliant with medications but only taking one half of her Amaryl.  No history of hypertension. Previous urine microalbumins have been negative. Hyperlipidemia treated with Lipitor. Needs refills.  GERD controlled with Nexium 40 mg daily. No dysphagia. No appetite or weight changes.  Past Medical History  Diagnosis Date  . Diabetes mellitus   . GERD (gastroesophageal reflux disease)   . Hyperlipidemia   . Colon polyps    Past Surgical History  Procedure Date  . Abdominal hysterectomy   . Tonsillectomy   . Foot surgery   . Retinal detachment surgery     reports that she has never smoked. She does not have any smokeless tobacco history on file. She reports that she does not drink alcohol. Her drug history not on file. family history includes Breast cancer in an unspecified family member and Heart disease in an unspecified family member. No Known Allergies    Review of Systems  Constitutional: Negative for appetite change and unexpected weight change.  Eyes: Negative for visual disturbance.  Respiratory: Negative for cough and shortness of breath.   Cardiovascular: Negative for chest pain, palpitations and leg swelling.  Gastrointestinal: Negative for abdominal pain.  Genitourinary: Negative for dysuria.  Neurological: Negative for dizziness and syncope.  Hematological: Negative for adenopathy.  Psychiatric/Behavioral: Negative for dysphoric mood.       Objective:   Physical Exam  Constitutional: She is oriented to person, place, and time. She appears well-developed and well-nourished. No distress.  Cardiovascular: Normal rate and regular rhythm.   Pulmonary/Chest: Effort normal and  breath sounds normal. No respiratory distress. She has no wheezes. She has no rales.  Musculoskeletal:       Feet reveal no skin lesions. Good distal foot pulses. Good capillary refill. Normal sensation with monofilament testing Callous R great toe but no ulcer.  Neurological: She is alert and oriented to person, place, and time.  Psychiatric: She has a normal mood and affect. Her behavior is normal.          Assessment & Plan:  #1 type 2 diabetes. History of fair control. Recheck A1c. Check urine microalbumin. #2 hyperlipidemia. Lipids at goal last summer. Recheck at followup in 6 months. Refill Lipitor for one year  #3 history of GERD stable on Nexium 40 mg daily. Continue current medication  #4 history of chronic insomnia. Intermittent low dose alprazolam which she has taken for years. No recent falls.

## 2011-07-25 ENCOUNTER — Telehealth: Payer: Self-pay | Admitting: Family Medicine

## 2011-07-25 NOTE — Progress Notes (Signed)
Quick Note:  Pt husband informed ______ 

## 2011-07-25 NOTE — Telephone Encounter (Signed)
Was seen last week and she stated that she did not get lab results. Thanks.

## 2011-07-25 NOTE — Telephone Encounter (Signed)
Pt husband informed

## 2011-10-06 ENCOUNTER — Other Ambulatory Visit: Payer: Self-pay | Admitting: Family Medicine

## 2011-10-06 NOTE — Telephone Encounter (Signed)
Alprazolam has not been filled in [facility] yet

## 2011-10-07 NOTE — Telephone Encounter (Signed)
Refill for 6 months. 

## 2011-11-02 ENCOUNTER — Other Ambulatory Visit: Payer: Self-pay | Admitting: Gastroenterology

## 2011-11-17 ENCOUNTER — Encounter: Payer: Self-pay | Admitting: Family Medicine

## 2012-01-13 ENCOUNTER — Other Ambulatory Visit: Payer: Self-pay | Admitting: Family Medicine

## 2012-01-17 ENCOUNTER — Encounter: Payer: Self-pay | Admitting: Family Medicine

## 2012-01-17 ENCOUNTER — Ambulatory Visit (INDEPENDENT_AMBULATORY_CARE_PROVIDER_SITE_OTHER): Payer: Medicare Other | Admitting: Family Medicine

## 2012-01-17 VITALS — BP 122/84 | Temp 99.1°F | Wt 141.0 lb

## 2012-01-17 DIAGNOSIS — L821 Other seborrheic keratosis: Secondary | ICD-10-CM

## 2012-01-17 DIAGNOSIS — K219 Gastro-esophageal reflux disease without esophagitis: Secondary | ICD-10-CM

## 2012-01-17 DIAGNOSIS — E785 Hyperlipidemia, unspecified: Secondary | ICD-10-CM

## 2012-01-17 DIAGNOSIS — E119 Type 2 diabetes mellitus without complications: Secondary | ICD-10-CM

## 2012-01-17 LAB — LIPID PANEL
HDL: 68.2 mg/dL (ref >39.00)
Total CHOL/HDL Ratio: 3
Triglycerides: 191 mg/dL — ABNORMAL HIGH (ref 0.0–149.0)

## 2012-01-17 LAB — BASIC METABOLIC PANEL
CO2: 25 mEq/L (ref 19–32)
Chloride: 103 mEq/L (ref 96–112)
Creatinine, Ser: 0.8 mg/dL (ref 0.4–1.2)
Sodium: 139 mEq/L (ref 135–145)

## 2012-01-17 LAB — HEPATIC FUNCTION PANEL: Total Bilirubin: 0.6 mg/dL (ref 0.3–1.2)

## 2012-01-17 LAB — MICROALBUMIN / CREATININE URINE RATIO
Creatinine,U: 145.2 mg/dL
Microalb Creat Ratio: 0.8 mg/g (ref 0.0–30.0)
Microalb, Ur: 1.2 mg/dL (ref 0.0–1.9)

## 2012-01-17 LAB — LDL CHOLESTEROL, DIRECT: Direct LDL: 112.6 mg/dL

## 2012-01-17 LAB — HEMOGLOBIN A1C: Hgb A1c MFr Bld: 6.6 % — ABNORMAL HIGH (ref 4.6–6.5)

## 2012-01-17 MED ORDER — ESOMEPRAZOLE MAGNESIUM 40 MG PO CPDR: 40.0000 mg | DELAYED_RELEASE_CAPSULE | Freq: Every day | ORAL | Status: DC

## 2012-01-17 NOTE — Progress Notes (Signed)
  Subjective:    Patient ID: Melissa [name], female    DOB: [birth year], 77 y.o.   MRN: [id number]  HPI  Medical followup. Patient has history of type 2 diabetes, hyperlipidemia, GERD, and chronic insomnia. She takes very low dose alprazolam rarely. Not checking blood sugars. No hypoglycemia. No symptoms of hyperglycemia. Diabetes been well controlled. She sees eye physician regularly. No recent visual changes.  Irritated seborrheic keratosis anterior chest. Requesting excision. GERD symptoms well controlled with Nexium. Recently had prior authorization. She's tried several times coming off this but had breakthrough symptoms each time.  Past Medical History  Diagnosis Date  . Diabetes mellitus   . GERD (gastroesophageal reflux disease)   . Hyperlipidemia   . Colon polyps    Past Surgical History  Procedure Date  . Abdominal hysterectomy   . Tonsillectomy   . Foot surgery   . Retinal detachment surgery     reports that she has never smoked. She does not have any smokeless tobacco history on file. She reports that she does not drink alcohol. Her drug history not on file. family history includes Breast cancer in an unspecified family member and Heart disease in an unspecified family member. No Known Allergies    Review of Systems  Constitutional: Negative for fatigue.  Eyes: Negative for visual disturbance.  Respiratory: Negative for cough, chest tightness, shortness of breath and wheezing.   Cardiovascular: Negative for chest pain, palpitations and leg swelling.  Neurological: Negative for dizziness, seizures, syncope, weakness, light-headedness and headaches.       Objective:   Physical Exam  Constitutional: She appears well-developed and well-nourished. No distress.  Cardiovascular: Normal rate and regular rhythm.   Pulmonary/Chest: Effort normal and breath sounds normal. No respiratory distress. She has no wheezes. She has no rales.  Musculoskeletal: She exhibits no edema.       Feet reveal no skin lesions. Good distal foot pulses. Good capillary refill. No calluses. Normal sensation with monofilament testing   Skin:       Mid upper anterior chest approximately 6-7 mm well demarcated hyperkeratotic lesion consistent with probable seborrheic keratosis          Assessment & Plan:  #1 type 2 diabetes. History of good control. Check A1c. Check urine microalbumin screen. Continue regular eye exams. #2 irritated seborrheic keratosis anterior chest. Patient will schedule for excision #3 history of GERD stable refilled Nexium for one year #4 hyperlipidemia. Check lipid and hepatic panel

## 2012-01-18 NOTE — Progress Notes (Signed)
Quick Note:  Pt informed on home VM ______ 

## 2012-03-28 ENCOUNTER — Other Ambulatory Visit: Payer: Self-pay | Admitting: Family Medicine

## 2012-04-18 ENCOUNTER — Ambulatory Visit (INDEPENDENT_AMBULATORY_CARE_PROVIDER_SITE_OTHER): Payer: Medicare Other | Admitting: Family Medicine

## 2012-04-18 ENCOUNTER — Encounter: Payer: Self-pay | Admitting: Family Medicine

## 2012-04-18 VITALS — BP 120/72 | Temp 98.0°F | Wt 144.0 lb

## 2012-04-18 DIAGNOSIS — G4700 Insomnia, unspecified: Secondary | ICD-10-CM

## 2012-04-18 DIAGNOSIS — L820 Inflamed seborrheic keratosis: Secondary | ICD-10-CM

## 2012-04-18 DIAGNOSIS — E119 Type 2 diabetes mellitus without complications: Secondary | ICD-10-CM

## 2012-04-18 MED ORDER — METFORMIN HCL 500 MG PO TABS: ORAL_TABLET | ORAL | Status: DC

## 2012-04-18 NOTE — Patient Instructions (Addendum)
Keep wound dry for the first 24 hours then clean daily with soap and water for one week. Apply topical antibiotic daily for 3-4 days. Keep covered with clean dressing for 4-5 days. Follow up promptly for any signs of infection such as redness, warmth, pain, or drainage.  

## 2012-04-18 NOTE — Progress Notes (Signed)
  Subjective:    Patient ID: Melissa [name], female    DOB: 1934-06-28, 77 y.o.   MRN: [id number]  HPI  Here for excision irritated seborrheic keratosis anterior chest. Discussed last visit. Frequently rubs against clothing. Sometimes itches. No bleeding.  Type 2 diabetes. History of good control. Remains on metformin and Amaryl. Metformin refilled today. Last A1c 6.6%. No symptoms of hyperglycemia. Blood sugar stable.  Chronic insomnia. Has been on low-dose alprazolam for years but takes somewhat infrequently. Still has occasional difficulty falling asleep. Sleep hygiene discussed.  Past Medical History  Diagnosis Date  . Diabetes mellitus   . GERD (gastroesophageal reflux disease)   . Hyperlipidemia   . Colon polyps    Past Surgical History  Procedure Date  . Abdominal hysterectomy   . Tonsillectomy   . Foot surgery   . Retinal detachment surgery     reports that she has never smoked. She does not have any smokeless tobacco history on file. She reports that she does not drink alcohol. Her drug history not on file. family history includes Breast cancer in an unspecified family member and Heart disease in an unspecified family member. No Known Allergies    Review of Systems  Constitutional: Negative for fatigue.  Eyes: Negative for visual disturbance.  Respiratory: Negative for cough, chest tightness, shortness of breath and wheezing.   Cardiovascular: Negative for chest pain, palpitations and leg swelling.  Neurological: Negative for dizziness, seizures, syncope, weakness, light-headedness and headaches.       Objective:   Physical Exam  Constitutional: She appears well-developed and well-nourished.  Cardiovascular: Normal rate and regular rhythm.   Pulmonary/Chest: Effort normal and breath sounds normal. No respiratory distress. She has no wheezes. She has no rales.  Skin:       Anterior chest wall mid sternal area reveals hyperkeratotic brown scaly lesion which is about  8 mm diameter. No ulceration          Assessment & Plan:  #1 irritated seborrheic keratosis. Discussed risks and benefits of shave excision including risks of bleeding, bruising, scar formation, and infection and patient consented to proceed.  Skin prepped with betadine and alcohol and shave excision of lesion with #15 blade with minimal bleeding.  Patient tolerated well.  Antibiotic and dressing  Applied. Minimal bleeding controlled with silver nitrate. Specimen sent to pathology #2 type 2 diabetes. Excellent control. Recheck A1c at followup #3 chronic insomnia. Sleep hygiene discussed. We've not recommended additional sleep medication this time

## 2012-04-19 ENCOUNTER — Telehealth: Payer: Self-pay | Admitting: Family Medicine

## 2012-04-19 DIAGNOSIS — E119 Type 2 diabetes mellitus without complications: Secondary | ICD-10-CM

## 2012-04-19 NOTE — Telephone Encounter (Signed)
Pt requesting to have A1C repeated in Jan. Pls enter order.

## 2012-04-20 ENCOUNTER — Other Ambulatory Visit: Payer: Self-pay | Admitting: Family Medicine

## 2012-04-20 DIAGNOSIS — Z1231 Encounter for screening mammogram for malignant neoplasm of breast: Secondary | ICD-10-CM

## 2012-04-20 NOTE — Progress Notes (Signed)
Quick Note:  Pt informed ______ 

## 2012-05-31 ENCOUNTER — Ambulatory Visit
Admission: RE | Admit: 2012-05-31 | Discharge: 2012-05-31 | Disposition: A | Discharge status: Home / Self Care | Payer: Medicare Other | Source: Ambulatory Visit | Attending: Family Medicine | Admitting: Family Medicine

## 2012-05-31 DIAGNOSIS — Z1231 Encounter for screening mammogram for malignant neoplasm of breast: Secondary | ICD-10-CM

## 2012-07-10 ENCOUNTER — Other Ambulatory Visit (INDEPENDENT_AMBULATORY_CARE_PROVIDER_SITE_OTHER): Payer: Medicare Other

## 2012-07-10 DIAGNOSIS — E119 Type 2 diabetes mellitus without complications: Secondary | ICD-10-CM

## 2012-07-10 NOTE — Progress Notes (Signed)
Quick Note:  Pt informed ______ 

## 2012-07-26 ENCOUNTER — Encounter: Payer: Self-pay | Admitting: Family Medicine

## 2012-10-17 ENCOUNTER — Encounter: Payer: Self-pay | Admitting: Family Medicine

## 2012-10-17 ENCOUNTER — Ambulatory Visit (INDEPENDENT_AMBULATORY_CARE_PROVIDER_SITE_OTHER): Payer: Medicare Other | Admitting: Family Medicine

## 2012-10-17 VITALS — BP 110/72 | Temp 99.3°F | Wt 144.0 lb

## 2012-10-17 DIAGNOSIS — E785 Hyperlipidemia, unspecified: Secondary | ICD-10-CM

## 2012-10-17 DIAGNOSIS — E119 Type 2 diabetes mellitus without complications: Secondary | ICD-10-CM

## 2012-10-17 DIAGNOSIS — G4700 Insomnia, unspecified: Secondary | ICD-10-CM

## 2012-10-17 DIAGNOSIS — K219 Gastro-esophageal reflux disease without esophagitis: Secondary | ICD-10-CM

## 2012-10-17 LAB — HEPATIC FUNCTION PANEL
Alkaline Phosphatase: 57 U/L (ref 39–117)
Bilirubin, Direct: 0.1 mg/dL (ref 0.0–0.3)

## 2012-10-17 LAB — LIPID PANEL
LDL Cholesterol: 89 mg/dL (ref 0–99)
Total CHOL/HDL Ratio: 3

## 2012-10-17 MED ORDER — ALPRAZOLAM 0.5 MG PO TABS: 0.5000 mg | ORAL_TABLET | Freq: Every evening | ORAL | Status: DC | PRN

## 2012-10-17 NOTE — Progress Notes (Signed)
  Subjective:    Patient ID: Melissa [name], female    DOB: 29-Jan-1935, 78 y.o.   MRN: [id number]  HPI  Medical follow up Patient has history of type 2 diabetes, hyperlipidemia, GERD, and some chronic insomnia She does not check blood sugars regularly. No recent symptoms of hyper or hypoglycemia. A1c's have been consistently well controlled. She continues to get regular eye exams. No history of neuropathy or nephropathy. Medications reviewed and compliant with all. Denies any side effects. No recent chest pains or dizziness.  Chronic insomnia. Takes as needed low-dose alprazolam very infrequently, generally about 2 times per week. No history of depression.  Past Medical History  Diagnosis Date  . Diabetes mellitus   . GERD (gastroesophageal reflux disease)   . Hyperlipidemia   . Colon polyps    Past Surgical History  Procedure Laterality Date  . Abdominal hysterectomy    . Tonsillectomy    . Foot surgery    . Retinal detachment surgery      reports that she has never smoked. She does not have any smokeless tobacco history on file. She reports that she does not drink alcohol. Her drug history is not on file. family history includes Breast cancer in an unspecified family member and Heart disease in an unspecified family member. No Known Allergies   Review of Systems  Constitutional: Negative for fatigue and unexpected weight change.  Eyes: Negative for visual disturbance.  Respiratory: Negative for cough, chest tightness, shortness of breath and wheezing.   Cardiovascular: Negative for chest pain, palpitations and leg swelling.  Neurological: Negative for dizziness, seizures, syncope, weakness, light-headedness and headaches.       Objective:   Physical Exam  Constitutional: She appears well-developed and well-nourished. No distress.  HENT:  Mouth/Throat: Oropharynx is clear and moist.  Neck: Neck supple.  Cardiovascular: Normal rate and regular rhythm.    Pulmonary/Chest: Effort normal and breath sounds normal. No respiratory distress. She has no wheezes. She has no rales.  Musculoskeletal: She exhibits no edema.  Lymphadenopathy:    She has no cervical adenopathy.  Skin:  Feet reveal no skin lesions. Good distal foot pulses. Good capillary refill. No calluses. Normal sensation with monofilament testing           Assessment & Plan:  #1 type 2 diabetes. Well controlled. Continue current medications. Recheck A1c. Continue regular eye exams. Foot exam normal #2 hyperlipidemia. Check lipid and hepatic panel #3 chronic insomnia. Sleep hygiene discussed. Refill alprazolam which she uses infrequently at very low dose

## 2012-10-18 NOTE — Progress Notes (Signed)
Quick Note:  Pt informed ______ 

## 2012-10-30 ENCOUNTER — Other Ambulatory Visit: Payer: Self-pay | Admitting: Family Medicine

## 2013-03-28 ENCOUNTER — Other Ambulatory Visit: Payer: Self-pay

## 2013-03-28 DIAGNOSIS — K219 Gastro-esophageal reflux disease without esophagitis: Secondary | ICD-10-CM

## 2013-03-28 MED ORDER — ESOMEPRAZOLE MAGNESIUM 40 MG PO CPDR: 40.0000 mg | DELAYED_RELEASE_CAPSULE | Freq: Every day | ORAL | Status: DC

## 2013-04-10 ENCOUNTER — Encounter: Payer: Self-pay | Admitting: Family Medicine

## 2013-04-10 ENCOUNTER — Ambulatory Visit (INDEPENDENT_AMBULATORY_CARE_PROVIDER_SITE_OTHER): Payer: Medicare Other | Admitting: Family Medicine

## 2013-04-10 VITALS — BP 120/80 | HR 110 | Temp 98.0°F | Wt 143.0 lb

## 2013-04-10 DIAGNOSIS — K219 Gastro-esophageal reflux disease without esophagitis: Secondary | ICD-10-CM

## 2013-04-10 DIAGNOSIS — E119 Type 2 diabetes mellitus without complications: Secondary | ICD-10-CM

## 2013-04-10 DIAGNOSIS — K146 Glossodynia: Secondary | ICD-10-CM

## 2013-04-10 DIAGNOSIS — E785 Hyperlipidemia, unspecified: Secondary | ICD-10-CM

## 2013-04-10 DIAGNOSIS — G4700 Insomnia, unspecified: Secondary | ICD-10-CM

## 2013-04-10 LAB — MICROALBUMIN / CREATININE URINE RATIO
Creatinine,U: 123.9 mg/dL
Microalb, Ur: 1.1 mg/dL (ref 0.0–1.9)

## 2013-04-10 LAB — BASIC METABOLIC PANEL
CO2: 28 mEq/L (ref 19–32)
Calcium: 9.7 mg/dL (ref 8.4–10.5)
Chloride: 103 mEq/L (ref 96–112)
Sodium: 140 mEq/L (ref 135–145)

## 2013-04-10 LAB — HM DIABETES FOOT EXAM: HM Diabetic Foot Exam: NORMAL

## 2013-04-10 LAB — HEMOGLOBIN A1C: Hgb A1c MFr Bld: 6.8 % — ABNORMAL HIGH (ref 4.6–6.5)

## 2013-04-10 NOTE — Progress Notes (Signed)
  Subjective:    Patient ID: Melissa [name], female    DOB: 02-Feb-1935, 78 y.o.   MRN: [id number]  HPI  Patient seen for several issues as follows  Several months of burning mouth symptoms. These are intermittent. She saw her dentist who diagnosed possible burning mouth syndrome. Symptoms are intermittent and relatively mild. She's never had any tongue ulcerations. No recent changes to space. No consistent use of mouthwash. No evidence for thrush  Type 2 diabetes. Does not check blood sugars. A1c is well controlled. No symptoms of hyperglycemia. No recent hypoglycemia. Compliant with medications Hyperlipidemia treated with Lipitor and she is sometimes not compliant with taking this. She denies any side effects.  She has a long history of GERD and is on Nexium. Symptoms well controlled. She's tried tapering this the past but has had breakthrough symptoms. No recent dysphagia.  Past Medical History  Diagnosis Date  . Diabetes mellitus   . GERD (gastroesophageal reflux disease)   . Hyperlipidemia   . Colon polyps    Past Surgical History  Procedure Laterality Date  . Abdominal hysterectomy    . Tonsillectomy    . Foot surgery    . Retinal detachment surgery      reports that she has never smoked. She does not have any smokeless tobacco history on file. She reports that she does not drink alcohol. Her drug history is not on file. family history includes Breast cancer in an other family member; Heart disease in an other family member. No Known Allergies  Review of Systems  Constitutional: Negative for appetite change, fatigue and unexpected weight change.  HENT: Negative for trouble swallowing.   Eyes: Negative for visual disturbance.  Respiratory: Negative for cough, chest tightness, shortness of breath and wheezing.   Cardiovascular: Negative for chest pain, palpitations and leg swelling.  Neurological: Negative for dizziness, seizures, syncope, weakness, light-headedness and  headaches.       Objective:   Physical Exam  Constitutional: She is oriented to person, place, and time. She appears well-developed.  HENT:  Mouth/Throat: Oropharynx is clear and moist.  Oropharynx is clear. No tongue lesions. No thrush. No ulcerations  Neck: Neck supple. No thyromegaly present.  Cardiovascular: Normal rate and regular rhythm.   Pulmonary/Chest: Effort normal and breath sounds normal. No respiratory distress. She has no wheezes. She has no rales.  Musculoskeletal: She exhibits no edema.  Lymphadenopathy:    She has no cervical adenopathy.  Neurological: She is alert and oriented to person, place, and time. No cranial nerve deficit.  Skin:  Feet reveal no lesions. Good dorsalis pedis pulse. Very small callus right great toe otherwise normal  Psychiatric: She has a normal mood and affect. Her behavior is normal.          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 Unremarkable exam. Avoid toothpaste with sodium laurel sulfate. We discussed possible options including gabapentin. We'll try to avoid amitriptyline given her age #2 type 2 diabetes. History of good control. Recheck A1c. Check urine microalbumin since she's not on ACE inhibitor. #3 GERD stable continue Nexium #4 hyperlipidemia. Continue Lipitor

## 2013-04-30 ENCOUNTER — Ambulatory Visit (INDEPENDENT_AMBULATORY_CARE_PROVIDER_SITE_OTHER): Payer: Medicare Other | Admitting: Internal Medicine

## 2013-04-30 ENCOUNTER — Telehealth: Payer: Self-pay | Admitting: Family Medicine

## 2013-04-30 ENCOUNTER — Encounter: Payer: Self-pay | Admitting: Internal Medicine

## 2013-04-30 VITALS — BP 108/76 | HR 100 | Temp 98.3°F | Wt 141.0 lb

## 2013-04-30 DIAGNOSIS — E119 Type 2 diabetes mellitus without complications: Secondary | ICD-10-CM

## 2013-04-30 DIAGNOSIS — K219 Gastro-esophageal reflux disease without esophagitis: Secondary | ICD-10-CM

## 2013-04-30 DIAGNOSIS — R1013 Epigastric pain: Secondary | ICD-10-CM

## 2013-04-30 DIAGNOSIS — R079 Chest pain, unspecified: Secondary | ICD-10-CM | POA: Insufficient documentation

## 2013-04-30 DIAGNOSIS — R52 Pain, unspecified: Secondary | ICD-10-CM

## 2013-04-30 MED ORDER — PANTOPRAZOLE SODIUM 40 MG PO TBEC: 40.0000 mg | DELAYED_RELEASE_TABLET | Freq: Every day | ORAL | Status: DC

## 2013-04-30 NOTE — Telephone Encounter (Signed)
Noted  

## 2013-04-30 NOTE — Patient Instructions (Addendum)
Uncertain cause of your pain  Location seems like poss you Hiatal hernia and reflux . We will get chest x ray and abdomen ultrasound  however if getting worse recheck  immediately  Sometimes heart problems will also cause stomach pains . Will plan close fu with dr Caryl Never

## 2013-04-30 NOTE — Progress Notes (Signed)
Chief Complaint  Patient presents with  . Chest Pain    Ongoing for 1 month.    . Back Pain    HPI: Patient comes in today for SDA for  new problem evaluation. PCP NA  Has been hurting off and on for least a month. Describes it as lower chest upper abdomen when she points and initially was just in the front but now radiates to the back. Takes excedrin and then hleps it .  Episodic but always something there doesn't awake her from sleep she describes it as a hard pain in gets bloated never totally goes away no vomiting no associated shortness of breath sweating off he did have belching with a sour taste in her mouth. Hx of thoracic back pain ? If lipitor caused them. Question onset front pain  soon After flu shot  .  Sometimes is worse after she lifts things but no specific other activity or body position. And then last week to back. Taking nexium  For 5 years. For what sounds like significant esophagitis with dysphagia at some point. She takes the Nexium after eating because it's easier for her. Was told she had a l hiatal hernia States her diabetes is in control and is on a 6 month check. ROS: See pertinent positives and negatives per HPI. Lives at home with her husband takes occasional Excedrin for migraines but doesn't get them very often. Past Medical History  Diagnosis Date  . Diabetes mellitus   . GERD (gastroesophageal reflux disease)   . Hyperlipidemia   . Colon polyps     Family History  Problem Relation Age of Onset  . Heart disease    . Breast cancer      History   Social History  . Marital Status: Married    Spouse Name: N/A    Number of Children: N/A  . Years of Education: N/A   Social History Main Topics  . Smoking status: Never Smoker   . Smokeless tobacco: None  . Alcohol Use: No  . Drug Use:   . Sexual Activity:    Other Topics Concern  . None   Social History Narrative  . None    Outpatient Encounter Prescriptions as of 04/30/2013  Medication  Sig  . ALPRAZolam (XANAX) 0.5 MG tablet Take 1 tablet (0.5 mg total) by mouth at bedtime as needed for sleep.  Marland Kitchen aspirin 81 MG tablet Take 81 mg by mouth daily.    Marland Kitchen atorvastatin (LIPITOR) 20 MG tablet Take 1 tablet (20 mg total) by mouth daily.  Marland Kitchen b complex vitamins tablet Take 1 tablet by mouth daily.    . calcium-vitamin D (OSCAL WITH D) 250-125 MG-UNIT per tablet Take 1 tablet by mouth daily.    Marland Kitchen esomeprazole (NEXIUM) 40 MG capsule Take 1 capsule (40 mg total) by mouth daily before breakfast.  . glimepiride (AMARYL) 4 MG tablet TAKE ONE TABLET BY MOUTH EVERY DAY BEFORE BREAKFAST  . metFORMIN (GLUCOPHAGE) 500 MG tablet TAKE TWO TABLETS BY MOUTH TWICE DAILY  . Multiple Vitamin (MULTIVITAMIN) tablet Take 1 tablet by mouth daily.    . pantoprazole (PROTONIX) 40 MG tablet Take 1 tablet (40 mg total) by mouth daily.    EXAM:  BP 108/76  Pulse 100  Temp(Src) 98.3 F (36.8 C) (Oral)  Wt 141 lb (63.957 kg)  SpO2 97%  Body mass index is 26.21 kg/(m^2).  GENERAL: vitals reviewed and listed above, alert, oriented, appears well hydrated and in no acute distress HEENT:  atraumatic, conjunctiva  clear, no obvious abnormalities on inspection of external nose and ears  NECK: no obvious masses on inspection palpation no adenopathy or JVD LUNGS: clear to auscultation bilaterally, no wheezes, rales or rhonchi, good air movement slight increased AP diameter decreased breath sounds equally. CV: HRRR, no clubbing cyanosis or  peripheral edema nl cap refill no gallops or murmurs are noted pulses appear to be equal MS: moves all extremities without noticeable focal  abnormality PSYCH: pleasant and cooperative, no obvious depression or anxiety cognition is intact. EKG sinus rhythm lafb poor r wave progression but no q in ant leads  ASSESSMENT AND PLAN:  Discussed the following assessment and plan:  Chest pain, unspecified - Plan: EKG 12-Lead, pantoprazole (PROTONIX) 40 MG tablet, DG Chest 2 View, US  Abdomen Complete  Acute epigastric pain - Plan: EKG 12-Lead, pantoprazole (PROTONIX) 40 MG tablet, DG Chest 2 View, US Abdomen Complete  DIAB W/O COMP TYPE II/UNS NOT STATED UNCNTRL - Plan: EKG 12-Lead, pantoprazole (PROTONIX) 40 MG tablet, DG Chest 2 View, US Abdomen Complete  Uncertain cause of her symptoms. Location appears to be more High epigastric possible biliary or hiatal hernia however some concerns about the waxing and waning and she is at risk of if it could be CV  She doesn't have any associated symptoms however seems to worse with certain activity .   Advised to try not to use Excedrin that has caffeine in it even though works for her pain.  Will change her ppi to one not effected by eating  Her EKG is not normal but there are no acute findings on at. No previous to compare. Her exam is non-alarming today.  Get x ray and [place]  Consider stress test for atypical cp cause of risk .   -Patient advised to return or notify health care team  if symptoms worsen or persist or new concerns arise.  Patient Instructions  Uncertain cause of your pain  Location seems like poss you Hiatal hernia and reflux . We will get chest x ray and abdomen ultrasound  however if getting worse recheck  immediately  Sometimes heart problems will also cause stomach pains . Will plan close fu with dr Sharma Covert K. Eletha Culbertson M.D.

## 2013-04-30 NOTE — Telephone Encounter (Signed)
Patient Information:  Caller Name: Melodie  Phone: [phone]  Patient: Melissa [name], Melissa [name]  Gender: Female  DOB: February 10, 1935  Age: 78 Years  PCP: Evelena Peat (Family Practice)  Office Follow Up:  Does the office need to follow up with this patient?: No  Instructions For The Office: N/A   Symptoms  Reason For Call & Symptoms: Pt complained of chest pain in center of the chest going to the back.  Reviewed Health History In EMR: Yes  Reviewed Medications In EMR: Yes  Reviewed Allergies In EMR: Yes  Reviewed Surgeries / Procedures: Yes  Date of Onset of Symptoms: 04/15/2013  Guideline(s) Used:  Chest Pain  Disposition Per Guideline:   See Today in Office  Reason For Disposition Reached:   Intermittent chest pains persist > 3 days  Advice Given:  Call Back If:  You become worse.  Patient Will Follow Care Advice:  YES  Appointment Scheduled:  04/30/2013 11:15:00 Appointment Scheduled Provider:  Berniece Andreas [facility])

## 2013-05-02 ENCOUNTER — Other Ambulatory Visit: Payer: Self-pay

## 2013-05-02 DIAGNOSIS — Z1231 Encounter for screening mammogram for malignant neoplasm of breast: Secondary | ICD-10-CM

## 2013-05-06 ENCOUNTER — Ambulatory Visit
Admission: RE | Admit: 2013-05-06 | Discharge: 2013-05-06 | Disposition: A | Discharge status: Home / Self Care | Payer: Medicare Other | Source: Ambulatory Visit | Attending: Internal Medicine | Admitting: Internal Medicine

## 2013-05-06 DIAGNOSIS — R079 Chest pain, unspecified: Secondary | ICD-10-CM

## 2013-05-06 DIAGNOSIS — E119 Type 2 diabetes mellitus without complications: Secondary | ICD-10-CM

## 2013-05-06 DIAGNOSIS — R1013 Epigastric pain: Secondary | ICD-10-CM

## 2013-05-14 ENCOUNTER — Encounter: Payer: Self-pay | Admitting: Family Medicine

## 2013-05-14 ENCOUNTER — Ambulatory Visit (INDEPENDENT_AMBULATORY_CARE_PROVIDER_SITE_OTHER): Payer: Medicare Other | Admitting: Family Medicine

## 2013-05-14 VITALS — BP 140/84 | Temp 98.2°F | Wt 146.0 lb

## 2013-05-14 DIAGNOSIS — R1013 Epigastric pain: Secondary | ICD-10-CM

## 2013-05-14 DIAGNOSIS — R0789 Other chest pain: Secondary | ICD-10-CM

## 2013-05-14 DIAGNOSIS — K3189 Other diseases of stomach and duodenum: Secondary | ICD-10-CM

## 2013-05-14 DIAGNOSIS — K219 Gastro-esophageal reflux disease without esophagitis: Secondary | ICD-10-CM

## 2013-05-14 NOTE — Progress Notes (Signed)
Pre visit review using our clinic review tool, if applicable. No additional management support is needed unless otherwise documented below in the visit note. 

## 2013-05-14 NOTE — Patient Instructions (Signed)
Continue with Protonix Follow up immediately for any chest pain or new symptoms such as shortness of breath Keep follow up with Dr Kinnie Scales.

## 2013-05-14 NOTE — Progress Notes (Signed)
  Subjective:    Patient ID: Melissa [name], female    DOB: 06-28-1934, 78 y.o.   MRN: [id number]  HPI Patient was recently seen with somewhat constant epigastric pains with some radiation toward the back and lower chest region. She has long history of some reflux and dyspepsia type symptoms. She did not describe exertional chest pain. She's not any dyspnea. She had EKG here which did not show any acute findings. Chest x-ray showed evidence for hiatal hernia but no acute findings. Abdominal ultrasound did not reveal gallstones or other acute abnormality.  Patient had been on Nexium for about 5 years or so chronic GERD symptoms. She was switched to Protonix  And does feel symptoms are improved at this time. She still has occasional belching. No recent vomiting. No recent appetite or weight changes. She has followup scheduled with gastroenterologist for early December. She doesn't have any cardiac history and again no recent exertional chest symptoms. No pleuritic pain. No regular nonsteroidal use. Does not describe any melena.  Past Medical History  Diagnosis Date  . Diabetes mellitus   . GERD (gastroesophageal reflux disease)   . Hyperlipidemia   . Colon polyps    Past Surgical History  Procedure Laterality Date  . Abdominal hysterectomy    . Tonsillectomy    . Foot surgery    . Retinal detachment surgery      reports that she has never smoked. She does not have any smokeless tobacco history on file. She reports that she does not drink alcohol. Her drug history is not on file. family history includes Breast cancer in an other family member; Heart disease in an other family member. No Known Allergies    Review of Systems  Constitutional: Negative for fever, chills, appetite change, fatigue and unexpected weight change.  HENT: Negative for trouble swallowing.   Respiratory: Negative for shortness of breath.   Cardiovascular: Negative for chest pain.  Gastrointestinal: Negative for  nausea, vomiting, diarrhea and abdominal distention.       Objective:   Physical Exam  Constitutional: She appears well-developed and well-nourished.  HENT:  Mouth/Throat: Oropharynx is clear and moist.  Cardiovascular: Normal rate and regular rhythm.   Pulmonary/Chest: Effort normal and breath sounds normal. No respiratory distress. She has no wheezes. She has no rales.  Abdominal: Soft. Bowel sounds are normal. She exhibits no distension and no mass. There is no tenderness. There is no rebound and no guarding.  Musculoskeletal: She exhibits no edema.  Psychiatric: She has a normal mood and affect. Her behavior is normal.          Assessment & Plan:   #1 dyspepsia and recent epigastric pain currently improved. She has history of known hiatal hernia and GERD. She does not have any red flag symptoms such as appetite or weight changes or recent dysphagia. Agree with GI referral. Continue protonix in the meantime. Her symptoms do not sound suspicious for cardiac etiology and she's had absolutely no exertional symptoms whatsoever. We've talked about dietary factors and dietary modification.

## 2013-05-27 ENCOUNTER — Other Ambulatory Visit: Payer: Self-pay | Admitting: Family Medicine

## 2013-05-29 ENCOUNTER — Telehealth: Payer: Self-pay

## 2013-05-29 NOTE — Telephone Encounter (Signed)
Xanax  Last refill 10/17/12 #30 3 refills Last visit 05/14/13   [facility]

## 2013-05-29 NOTE — Telephone Encounter (Signed)
Refill #30 with 2 refills 

## 2013-05-30 MED ORDER — ALPRAZOLAM 0.5 MG PO TABS: 0.5000 mg | ORAL_TABLET | Freq: Every evening | ORAL | Status: DC | PRN

## 2013-05-30 NOTE — Telephone Encounter (Signed)
Called in RX to pharmacy

## 2013-06-05 ENCOUNTER — Ambulatory Visit
Admission: RE | Admit: 2013-06-05 | Discharge: 2013-06-05 | Disposition: A | Discharge status: Home / Self Care | Payer: Medicare Other | Source: Ambulatory Visit

## 2013-06-05 DIAGNOSIS — Z1231 Encounter for screening mammogram for malignant neoplasm of breast: Secondary | ICD-10-CM

## 2013-07-22 ENCOUNTER — Telehealth: Payer: Self-pay | Admitting: Family Medicine

## 2013-07-22 NOTE — Telephone Encounter (Signed)
Pt was seen 05/14/13

## 2013-07-22 NOTE — Telephone Encounter (Signed)
Pt was seen in 22-2014. Pt is still having burning on [facility]. [facility] [place]

## 2013-07-23 NOTE — Telephone Encounter (Signed)
I recommend follow up.  We need to check B12 level and can discuss medication options at that time.

## 2013-07-24 NOTE — Telephone Encounter (Signed)
Left message for patient to set up appt to be seen.

## 2013-07-24 NOTE — Telephone Encounter (Signed)
Pt has appt sch for 07-26-13

## 2013-07-26 ENCOUNTER — Encounter: Payer: Self-pay | Admitting: Family Medicine

## 2013-07-26 ENCOUNTER — Ambulatory Visit (INDEPENDENT_AMBULATORY_CARE_PROVIDER_SITE_OTHER): Payer: Medicare Other | Admitting: Family Medicine

## 2013-07-26 VITALS — BP 130/78 | HR 90 | Temp 97.5°F | Wt 142.0 lb

## 2013-07-26 DIAGNOSIS — E119 Type 2 diabetes mellitus without complications: Secondary | ICD-10-CM

## 2013-07-26 DIAGNOSIS — R208 Other disturbances of skin sensation: Secondary | ICD-10-CM

## 2013-07-26 DIAGNOSIS — K1370 Unspecified lesions of oral mucosa: Secondary | ICD-10-CM

## 2013-07-26 LAB — HEMOGLOBIN A1C: Hgb A1c MFr Bld: 6.6 % — ABNORMAL HIGH (ref 4.6–6.5)

## 2013-07-26 LAB — VITAMIN B12: VITAMIN B 12: 439 pg/mL (ref 211–911)

## 2013-07-26 NOTE — Patient Instructions (Signed)
We will call you with labs results If B12 low, we will replace that If B12 normal, we will look at using medication called Gabapentin

## 2013-07-26 NOTE — Progress Notes (Signed)
   Subjective:    Patient ID: Melissa [name], female    DOB: 08/27/1934, [age] y.o.   MRN: [id number]  HPI Patient still persistent burning cessation involving her tongue mostly. Refer to note from back in October. We suspected burning mouth syndrome and she also seen her dentist. She has avoided toothpaste with sodium laurel sulfate. She's not using any regular mouthwash. She describes possible intermittent white spots involving her lateral tongue but she's never had a history of thrush. Denies any sore throat. No difficulty swallowing. No alleviating factors. No aggravating factors. She does take chronic proton pump inhibitor and has not had B12 level. She denies any peripheral dysesthesias  Type 2 diabetes. History of excellent control.  Remains on metformin and Amaryl. Blood sugar stable. No hypoglycemia. No symptoms of hyperglycemia.  Past Medical History  Diagnosis Date  . Diabetes mellitus   . GERD (gastroesophageal reflux disease)   . Hyperlipidemia   . Colon polyps    Past Surgical History  Procedure Laterality Date  . Abdominal hysterectomy    . Tonsillectomy    . Foot surgery    . Retinal detachment surgery      reports that she has never smoked. She does not have any smokeless tobacco history on file. She reports that she does not drink alcohol. Her drug history is not on file. family history includes Breast cancer in an other family member; Heart disease in an other family member. No Known Allergies     Review of Systems  Constitutional: Negative for fatigue and unexpected weight change.  HENT: Negative for trouble swallowing and voice change.   Eyes: Negative for visual disturbance.  Respiratory: Negative for cough, chest tightness, shortness of breath and wheezing.   Cardiovascular: Negative for chest pain, palpitations and leg swelling.  Neurological: Negative for dizziness, seizures, syncope, weakness, light-headedness and headaches.       Objective:   Physical  Exam  Constitutional: She is oriented to person, place, and time. She appears well-developed and well-nourished.  HENT:  Right Ear: External ear normal.  Left Ear: External ear normal.  Mouth/Throat: Oropharynx is clear and moist.  Neck: Neck supple.  Cardiovascular: Normal rate.   Pulmonary/Chest: Effort normal and breath sounds normal. No respiratory distress. She has no wheezes. She has no rales.  Lymphadenopathy:    She has no cervical adenopathy.  Neurological: She is alert and oriented to person, place, and time. No cranial nerve deficit.          Assessment & Plan:  #1 persistent burning mouth symptoms. 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 Given her chronic PPI use and age check B12 level. If normal consider low-dose gabapentin. She already at baseline has some intermittent constipation so a trial void try cyclic antidepressants. #2 type 2 diabetes. History of excellent control. Recheck A1c

## 2013-07-26 NOTE — Progress Notes (Signed)
Pre visit review using our clinic review tool, if applicable. No additional management support is needed unless otherwise documented below in the visit note. 

## 2013-07-29 ENCOUNTER — Other Ambulatory Visit: Payer: Self-pay

## 2013-07-29 MED ORDER — GABAPENTIN 100 MG PO CAPS: 100.0000 mg | ORAL_CAPSULE | Freq: Every day | ORAL | Status: DC

## 2013-07-30 ENCOUNTER — Telehealth: Payer: Self-pay | Admitting: Family Medicine

## 2013-07-30 DIAGNOSIS — R1013 Epigastric pain: Secondary | ICD-10-CM

## 2013-07-30 DIAGNOSIS — E119 Type 2 diabetes mellitus without complications: Secondary | ICD-10-CM

## 2013-07-30 DIAGNOSIS — R079 Chest pain, unspecified: Secondary | ICD-10-CM

## 2013-07-30 NOTE — Telephone Encounter (Signed)
Pt states that dr.burchette was provided her a rx for burning sensation of the mouth, pt stated she went to pharmacy today to pick it up, and the pharmacy stated the only rx they had for her was pantoprazole (PROTONIX) 40 MG tablet. Pt stated the rx was suppose to be ready after 2:00 today. Pt leaving for out of town in the morning for a week and needs the rx to take with her. Send to [facility] [place]

## 2013-07-31 ENCOUNTER — Telehealth: Payer: Self-pay

## 2013-07-31 MED ORDER — PANTOPRAZOLE SODIUM 40 MG PO TBEC: 40.0000 mg | DELAYED_RELEASE_TABLET | Freq: Every day | ORAL | Status: DC

## 2013-07-31 NOTE — Telephone Encounter (Signed)
Relevant patient education mailed to patient.  

## 2013-07-31 NOTE — Telephone Encounter (Signed)
RX sent to pharmacy  

## 2013-08-29 ENCOUNTER — Encounter: Payer: Self-pay | Admitting: Family Medicine

## 2013-10-02 DIAGNOSIS — IMO0002 Reserved for concepts with insufficient information to code with codable children: Secondary | ICD-10-CM | POA: Insufficient documentation

## 2013-10-02 DIAGNOSIS — E113213 Type 2 diabetes mellitus with mild nonproliferative diabetic retinopathy with macular edema, bilateral: Secondary | ICD-10-CM | POA: Insufficient documentation

## 2013-10-02 DIAGNOSIS — E113219 Type 2 diabetes mellitus with mild nonproliferative diabetic retinopathy with macular edema, unspecified eye: Secondary | ICD-10-CM | POA: Insufficient documentation

## 2013-10-02 DIAGNOSIS — E1165 Type 2 diabetes mellitus with hyperglycemia: Secondary | ICD-10-CM

## 2013-10-09 ENCOUNTER — Ambulatory Visit: Payer: Medicare Other | Admitting: Family Medicine

## 2013-11-23 ENCOUNTER — Other Ambulatory Visit: Payer: Self-pay | Admitting: Family Medicine

## 2014-04-25 ENCOUNTER — Other Ambulatory Visit: Payer: Self-pay

## 2014-04-25 DIAGNOSIS — Z1231 Encounter for screening mammogram for malignant neoplasm of breast: Secondary | ICD-10-CM

## 2014-06-06 ENCOUNTER — Ambulatory Visit
Admission: RE | Admit: 2014-06-06 | Discharge: 2014-06-06 | Disposition: A | Discharge status: Home / Self Care | Payer: Medicare Other | Source: Ambulatory Visit

## 2014-06-06 DIAGNOSIS — Z1231 Encounter for screening mammogram for malignant neoplasm of breast: Secondary | ICD-10-CM

## 2014-07-22 ENCOUNTER — Other Ambulatory Visit: Payer: Self-pay | Admitting: Family Medicine

## 2015-01-28 DIAGNOSIS — K449 Diaphragmatic hernia without obstruction or gangrene: Secondary | ICD-10-CM | POA: Insufficient documentation

## 2015-05-05 DIAGNOSIS — H3530 Unspecified macular degeneration: Secondary | ICD-10-CM | POA: Insufficient documentation

## 2015-05-11 ENCOUNTER — Other Ambulatory Visit: Payer: Self-pay

## 2015-05-11 DIAGNOSIS — Z1231 Encounter for screening mammogram for malignant neoplasm of breast: Secondary | ICD-10-CM

## 2015-06-17 ENCOUNTER — Encounter: Payer: Self-pay | Admitting: *Deleted

## 2015-06-17 ENCOUNTER — Ambulatory Visit
Admission: RE | Admit: 2015-06-17 | Discharge: 2015-06-17 | Disposition: A | Discharge status: Home / Self Care | Payer: Medicare Other | Source: Ambulatory Visit

## 2015-06-17 DIAGNOSIS — Z1231 Encounter for screening mammogram for malignant neoplasm of breast: Secondary | ICD-10-CM

## 2015-07-21 IMAGING — US US ABDOMEN COMPLETE
1 series · 14 of 25 positions shown · non-contrast
Comparison: CT 05/15/2007

CLINICAL DATA: Epigastric pain and indigestion. History of hiatal
hernia.

EXAM:
ULTRASOUND ABDOMEN COMPLETE

[Series 1: us abdomen complete · 0.29mm/px · 14 of 93 slices shown]
[im 1/93]
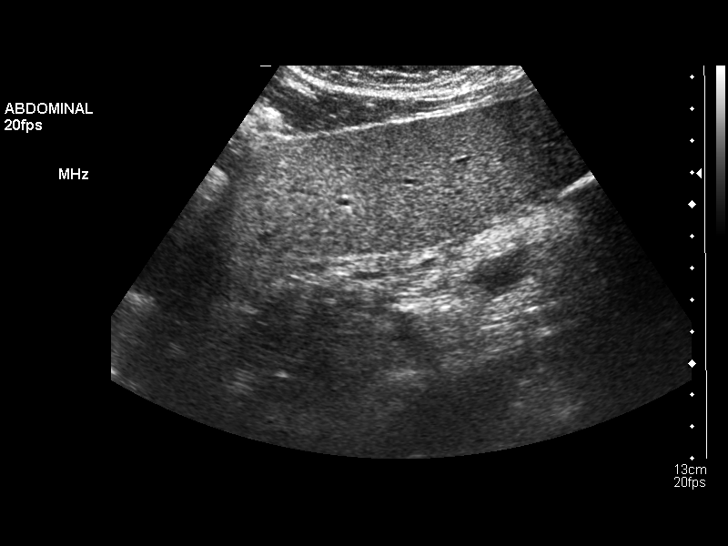
[im 8/93]
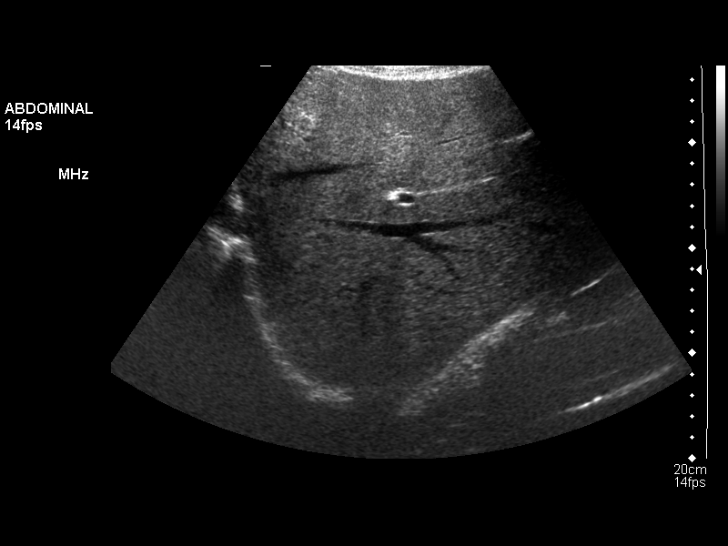
[im 16/93]
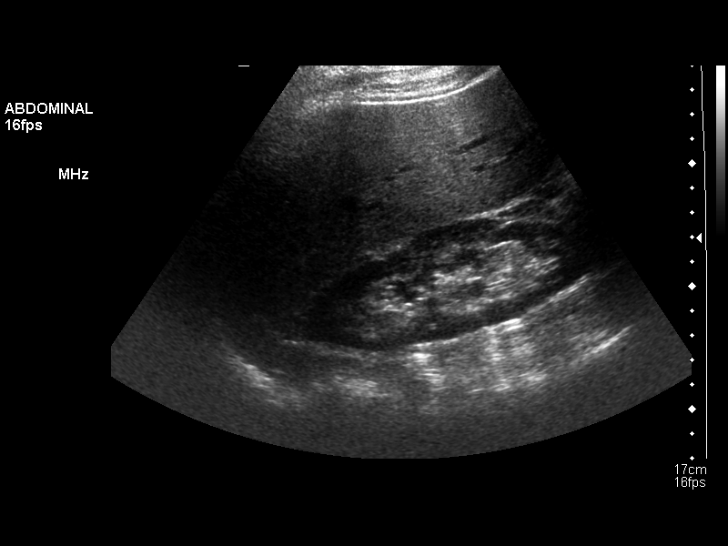
[im 24/93]
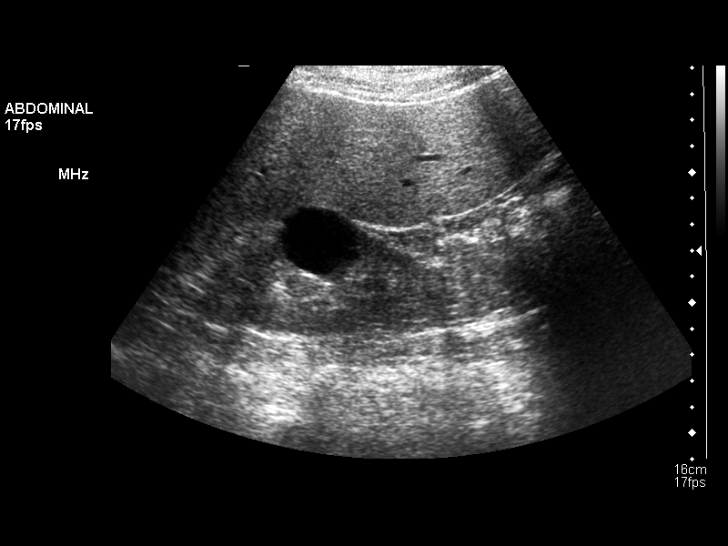
[im 31/93]
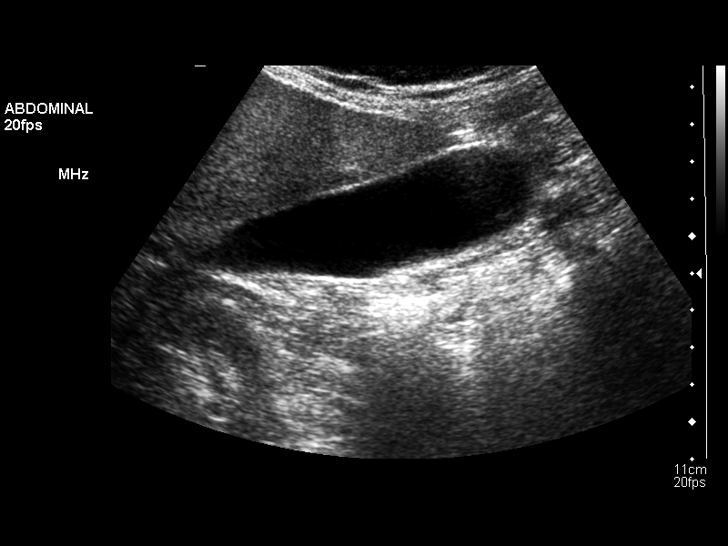
[im 35/93]
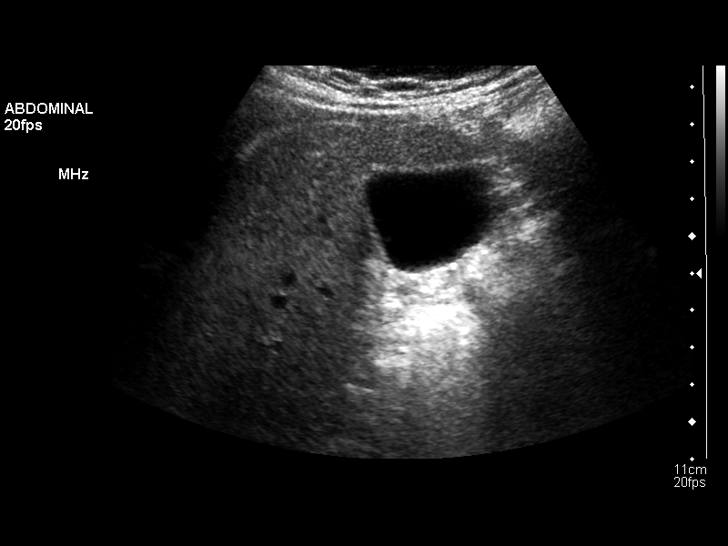
[im 43/93]
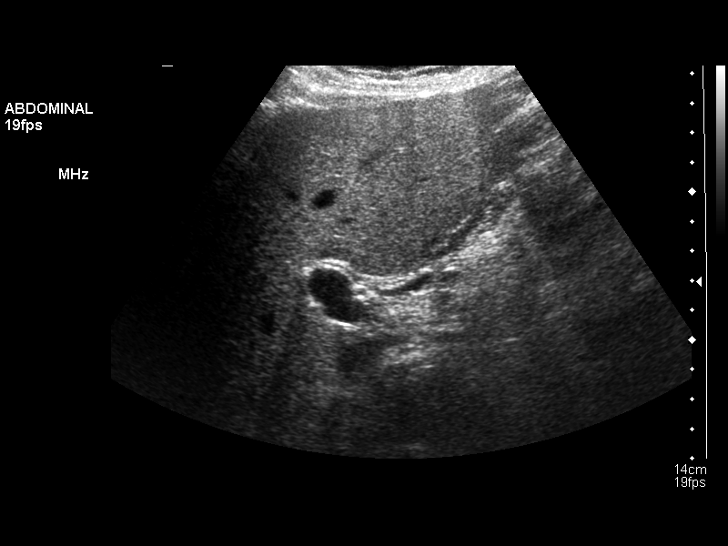
[im 50/93]
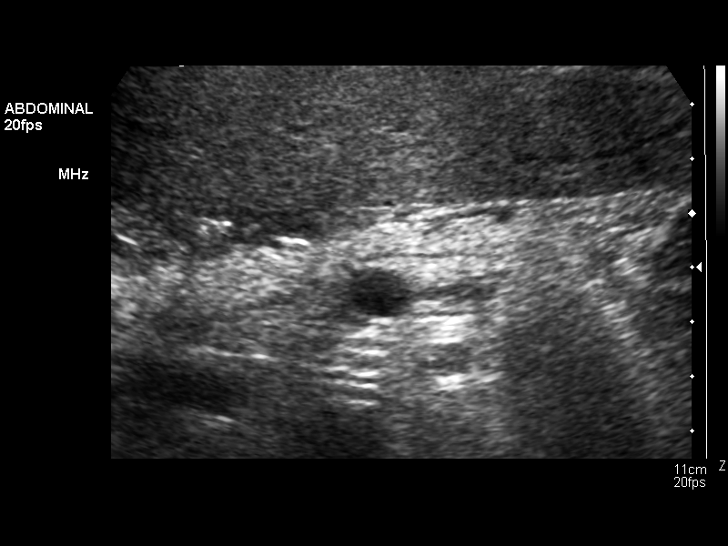
[im 58/93]
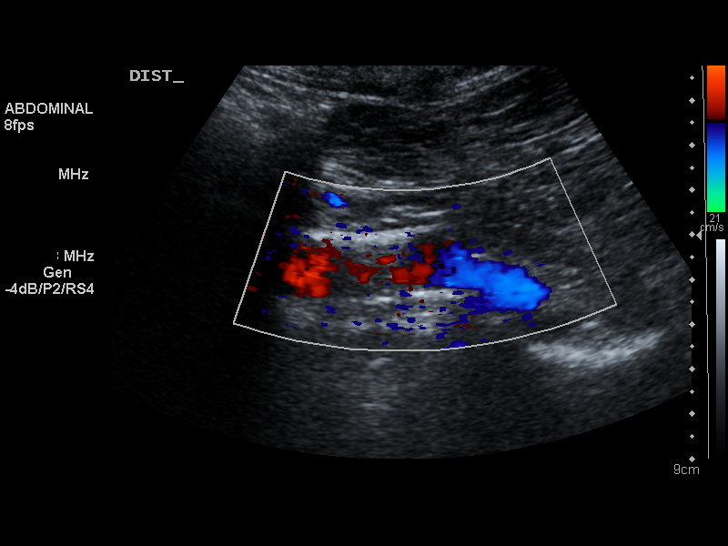
[im 62/93]
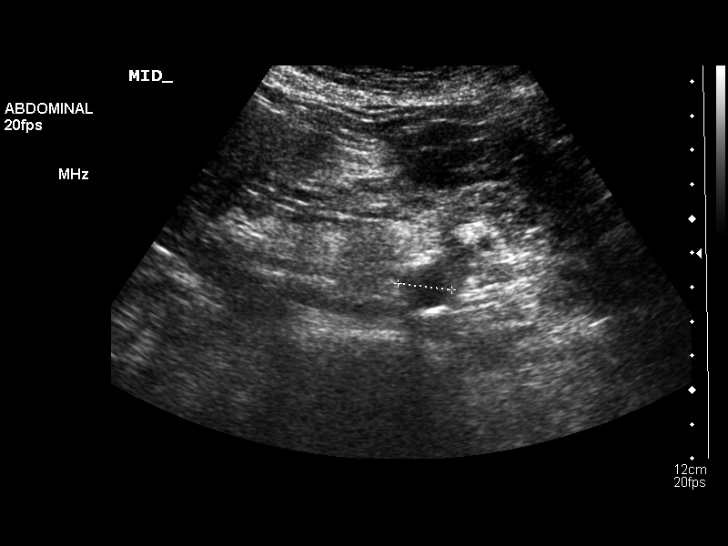
[im 70/93]
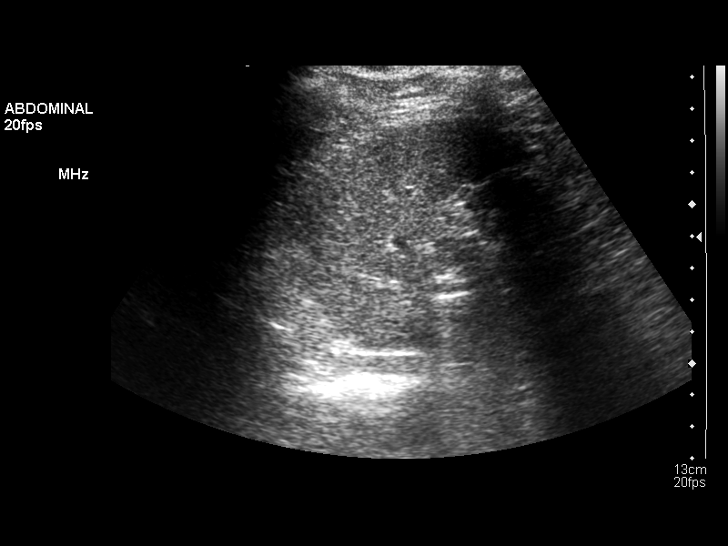
[im 77/93]
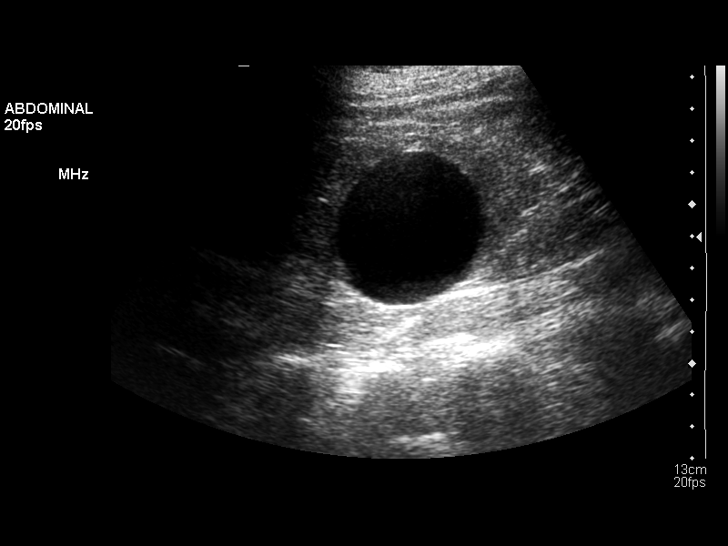
[im 85/93]
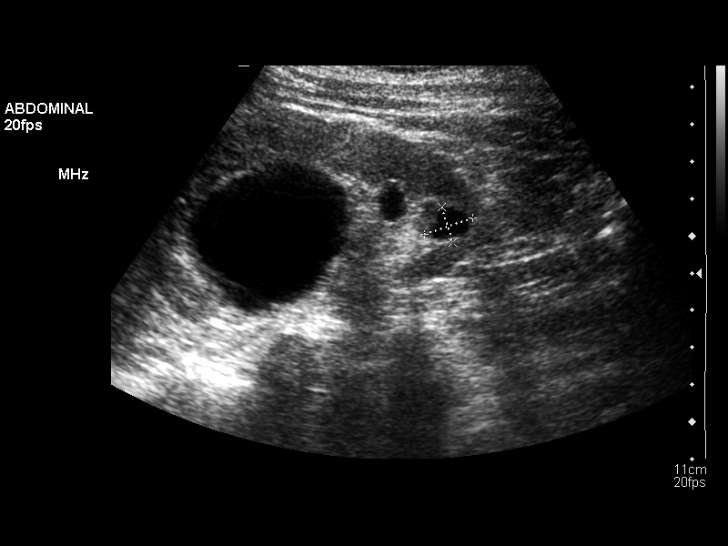
[im 93/93]
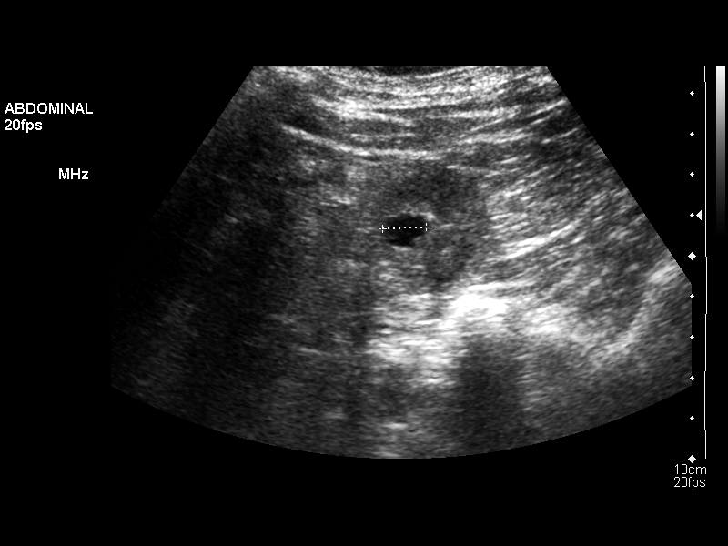

[14 of 25 positions shown; findings below may reference images not displayed]

FINDINGS: Gallbladder

No gallstones or wall thickening visualized. No sonographic Murphy
sign noted.

Common bile duct

Diameter: 5

Liver

Left liver lobe cyst or minimally complex cyst of 1.8 cm.

IVC

No abnormality visualized.

Pancreas

Visualized portion unremarkable.

Spleen

Size and appearance within normal limits.

Right Kidney

Length: 11.7 cm. 3.3 cm interpolar simple appearing cyst. No
hydronephrosis.

Left Kidney

Length: 11.7 cm. 4.8 cm inter/lower pole cyst with minimal
complexity. Smaller simple appearing cysts also identified. No
hydronephrosis.

Abdominal aorta

No aneurysm.  Mild atherosclerotic irregularity.
IMPRESSION: 1. No acute process or explanation for epigastric pain and
indigestion.
2. Bilateral renal cysts with a minimally complex left renal cyst.

## 2015-09-23 ENCOUNTER — Telehealth: Payer: Self-pay | Admitting: Family Medicine

## 2015-09-23 NOTE — Telephone Encounter (Signed)
Error

## 2015-11-11 ENCOUNTER — Ambulatory Visit (INDEPENDENT_AMBULATORY_CARE_PROVIDER_SITE_OTHER): Payer: Medicare Other | Admitting: Family Medicine

## 2015-11-11 ENCOUNTER — Encounter: Payer: Self-pay | Admitting: Family Medicine

## 2015-11-11 VITALS — BP 134/80 | HR 98 | Temp 98.6°F | Resp 12 | Ht 62.0 in | Wt 136.7 lb

## 2015-11-11 DIAGNOSIS — M546 Pain in thoracic spine: Secondary | ICD-10-CM

## 2015-11-11 DIAGNOSIS — E119 Type 2 diabetes mellitus without complications: Secondary | ICD-10-CM

## 2015-11-11 DIAGNOSIS — K219 Gastro-esophageal reflux disease without esophagitis: Secondary | ICD-10-CM

## 2015-11-11 DIAGNOSIS — K146 Glossodynia: Secondary | ICD-10-CM

## 2015-11-11 DIAGNOSIS — G4700 Insomnia, unspecified: Secondary | ICD-10-CM | POA: Diagnosis not present

## 2015-11-11 DIAGNOSIS — E7800 Pure hypercholesterolemia, unspecified: Secondary | ICD-10-CM | POA: Diagnosis not present

## 2015-11-11 HISTORY — DX: Glossodynia: K14.6

## 2015-11-11 LAB — BASIC METABOLIC PANEL
BUN: 16 mg/dL (ref 6–23)
CO2: 27 mEq/L (ref 19–32)
CREATININE: 0.83 mg/dL (ref 0.40–1.20)
Calcium: 9.9 mg/dL (ref 8.4–10.5)
Chloride: 104 mEq/L (ref 96–112)
GFR: 70.47 mL/min (ref >60.00)
Glucose, Bld: 101 mg/dL — ABNORMAL HIGH (ref 70–99)
Potassium: 4.5 mEq/L (ref 3.5–5.1)
Sodium: 138 mEq/L (ref 135–145)

## 2015-11-11 LAB — HEMOGLOBIN A1C: Hgb A1c MFr Bld: 6.8 % — ABNORMAL HIGH (ref 4.6–6.5)

## 2015-11-11 MED ORDER — GABAPENTIN 300 MG PO CAPS: 300.0000 mg | ORAL_CAPSULE | Freq: Every day | ORAL | Status: DC

## 2015-11-11 MED ORDER — PANTOPRAZOLE SODIUM 40 MG PO TBEC: 40.0000 mg | DELAYED_RELEASE_TABLET | Freq: Every day | ORAL | Status: DC

## 2015-11-11 NOTE — Progress Notes (Addendum)
Subjective:    Patient ID: Melissa [name], female    DOB: 05/10/1935, 81 y.o.   MRN: [id number]  HPI   Ms. Melissa LowersMartha N [name] is a 81 y.o.female here today to establish care with me, former pt of Dr Karleen HampshireSpencer.  She has Hx of DM II, HLD,back pain, and insomnia among some. Last routine physical was  ? 07/2015, not sure.  She lives with husband. Independent ADL's and IADL's.  She  tries to follow a healthy diet and does not exercises regularly, active around her house with daily chores.  Concerns today:GERD medication,burning tongue, and upper back pain + lab work.  Tongue burning for over 5 years. Has had ENT evaluation, dentists, and dermatologists.  According to pt, dermatologists recommended steroids but Rx was not called in. Occasionally she feels like she has sores on area but not seen on inspection. No taste changes, no Hx of zoster, and no facial symptoms. It is exacerbated by some foods: tomatoes and chocolate among some makes. Denies tobacco use.  She remembers taking Gabapentin, requesting it to see if it helps her sleep. She is not sure if it was prescribed for tongue symptoms or back pain.  Insomnia: Chronic, has tried OTC medications but do not help much. She took Xanax before, which was recommended mainly for anxiety, it made her "very sleepy" and helped, no side effects, discontinue by former PCP. She sleeps about 4-5 hours at night, no fatigue.   Diabetes Mellitus II: She is currently on Metformin 500 mg bid and Amaryl 4 mg daily. Problem has been stable, medication adjusted in 07/2015 after labs. She is not checking BS's, denies any hypoglycemia like symptoms. She is tolerating medications well. She denies abdominal pain, nausea, vomiting, polydipsia, polyuria, or polyphagia. No numbness, tingling, or burning (except for tongue).   Last eye exam less than a year ago. Reporting Hx of right eye decreased vision, "legaly blind" but cleared to drive because left eye is  fine with eye glasses. S/P vitrectomy right eye.   HgA1C 08/17/15 5.5.8 (6.1 in 04/2015).  Lab Results  Component Value Date   HGBA1C 6.6* 07/26/2013   Cr in 07/2015 0.7.  Hyperlipidemia: She is on Atorvastatin 20 mg daily. Following a low fat diet. She has not noted side effects with medication.   Lab Results  Component Value Date   CHOL 172 10/17/2012   HDL 56.10 10/17/2012   LDLCALC 89 10/17/2012   LDLDIRECT 112.6 01/17/2012   TRIG 133.0 10/17/2012   CHOLHDL 3 10/17/2012    08/17/2015 FLP: TC 201, HDL 70, TG 146,LDL 102.   GERD and hiatal hernia, better since Nexium was changed to Protonix. If she eats certain food she has some epigastric discomfort. According to pt, she saw GI about 2-3 years ago, she was hoping to have a EGD but was not recommended. She states that she had EGD a few years ago; she would like another done.  Denies nausea, vomiting, changes in bowel habits, blood in stool or melena.  Colonoscopy 10/2011: internal hemorrhoids, diverticulosis sigmoid colon.  Upper back pain, intermittent for many years, it can be "bad." Exacerbated by certain activities, relieved by rest and OTC Aleve, pain is not radiated. No associated numbness or tingling. Reporting "a lot of" test done. She wonders if any of her medications is causing problem.   Reviewing records: 04/2013 WUJ:[id number]:Hiatal hernia present. No edema or consolidation. Mild scarring left base. Abdominal u/s:1. No acute process or explanation for epigastric pain and indigestion.  2. Bilateral renal cysts with a minimally complex left renal cyst.  04/2007: abdominal/pelvic CT: 1. Large hiatal hernia.    2. Hepatic and renal cysts.    3. L2-3 degenerative disc disease with 5mm of retrolisthesis of L2 on L3.    4. No acute intra-abdominal findings   Review of Systems  Constitutional: Negative for fever, activity change, appetite change, fatigue and unexpected weight change.  HENT: Negative for facial  swelling, mouth sores, nosebleeds, trouble swallowing and voice change.   Eyes: Positive for visual disturbance (stable, right eye). Negative for redness.  Respiratory: Negative for apnea, cough, shortness of breath and wheezing.   Cardiovascular: Negative for chest pain, palpitations and leg swelling.  Gastrointestinal: Positive for abdominal pain (occasional epigastric discomfort.). Negative for nausea, vomiting and blood in stool.       Negative for changes in bowel habits.  Endocrine: Negative for polydipsia, polyphagia and polyuria.  Genitourinary: Negative for dysuria, hematuria, decreased urine volume and difficulty urinating.  Musculoskeletal: Positive for back pain. Negative for joint swelling, gait problem and neck pain.  Skin: Negative for color change and rash.  Neurological: Negative for tremors, seizures, syncope, facial asymmetry, weakness, numbness and headaches.  Psychiatric/Behavioral: Positive for sleep disturbance. Negative for confusion. The patient is not nervous/anxious.      Current Outpatient Prescriptions on File Prior to Visit  Medication Sig Dispense Refill  . aspirin 81 MG tablet Take 81 mg by mouth daily.      Marland Kitchen atorvastatin (LIPITOR) 20 MG tablet Take 1 tablet (20 mg total) by mouth daily. 30 tablet 11  . b complex vitamins tablet Take 1 tablet by mouth daily.      . metFORMIN (GLUCOPHAGE) 500 MG tablet TAKE TWO TABLETS BY MOUTH TWICE DAILY 120 tablet 11  . Multiple Vitamin (MULTIVITAMIN) tablet Take 1 tablet by mouth daily.      . calcium-vitamin D (OSCAL WITH D) 250-125 MG-UNIT per tablet Take 1 tablet by mouth daily. Reported on 11/11/2015    . glimepiride (AMARYL) 4 MG tablet TAKE ONE TABLET BY MOUTH ONCE DAILY IN THE MORNING BEFORE  BREAKFAST (Patient not taking: Reported on 11/11/2015) 30 tablet 5   No current facility-administered medications on file prior to visit.     Past Medical History  Diagnosis Date  . Diabetes mellitus   . GERD  (gastroesophageal reflux disease)   . Hyperlipidemia   . Colon polyps     Social History   Social History  . Marital Status: Married    Spouse Name: N/A  . Number of Children: N/A  . Years of Education: N/A   Social History Main Topics  . Smoking status: Never Smoker   . Smokeless tobacco: Never Used  . Alcohol Use: No  . Drug Use: None  . Sexual Activity: Not Asked   Other Topics Concern  . None   Social History Narrative    Filed Vitals:   11/11/15 1013  BP: 134/80  Pulse: 98  Temp: 98.6 F (37 C)  Resp: 12   Body mass index is 25 kg/(m^2).  SpO2 Readings from Last 3 Encounters:  11/11/15 96%  07/26/13 98%  04/30/13 97%        Objective:   Physical Exam  Constitutional: She is oriented to person, place, and time. She appears well-developed and well-nourished. She does not appear ill. No distress.  HENT:  Head: Atraumatic.  Mouth/Throat: Uvula is midline, oropharynx is clear and moist and mucous membranes are normal.  Eyes: Conjunctivae  and EOM are normal. Pupils are unequal (right one larger, non reactive, s/p surgery.).  Neck: Normal range of motion. No muscular tenderness present. No thyroid mass and no thyromegaly present.  Cardiovascular: Normal rate and regular rhythm.   No murmur heard. Pulses:      Dorsalis pedis pulses are 2+ on the right side, and 2+ on the left side.       Posterior tibial pulses are 2+ on the right side, and 2+ on the left side.  Pulmonary/Chest: Effort normal and breath sounds normal. She has no wheezes. She has no rales. She exhibits no tenderness.  Abdominal: Soft. She exhibits no mass. There is no hepatomegaly. There is no tenderness.  Musculoskeletal: She exhibits no edema.       Thoracic back: She exhibits no tenderness.  Shoulders minimal limitation or ROM, no pain elicited. Mild kyphosis.  No sign of synovitis appreciated.  Lymphadenopathy:       Head (right side): No submental and no submandibular adenopathy  present.       Head (left side): No submental and no submandibular adenopathy present.    She has no cervical adenopathy.  Neurological: She is alert and oriented to person, place, and time. She has normal strength. No cranial nerve deficit or sensory deficit. Coordination and gait normal.  Skin: Skin is warm. No rash noted. No erythema.  Psychiatric: Her speech is normal. Her mood appears anxious (mild). She does not exhibit a depressed mood.  Good eye contact, well groomed.  Nursing note and vitals reviewed.  Foot exam: Normal monofilament, hypertrophic toenails, calluses. DP and PT pulses present bilateral.      Assessment & Plan:    Tryniti was seen today for establish care.  Diagnoses and all orders for this visit:  Controlled type 2 diabetes mellitus without complication, without long-term current use of insulin (HCC)  HgA1C at goal. No changes in current management for now but if HgA1C < 7.0, I will consider stopping Amaryl. Regular exercise and healthy diet with avoidance of added sugar food intake is an important part of treatment and recommended. Annual eye exam, periodic dental and foot care recommended. F/U in 5-6 months   -     Hemoglobin A1C -     Basic metabolic panel  Gastroesophageal reflux disease without esophagitis  GERD precautions discussed. Risk of EGD discussed, since she feels like GERD symptom improved after changing PPI she agrees with continue monitoring. PPI side effects discussed. Will re-check in 2 months.  -     pantoprazole (PROTONIX) 40 MG tablet; Take 1 tablet (40 mg total) by mouth daily.   INSOMNIA, CHRONIC Good sleep hygiene. OTC Melatonin recommended. Gabapentin side effects discussed, explained it is not for insomnia treatment but might help.   Hypercholesterolemia Stable. Continue low fat diet. No changes in current management. F/U in 6-12 months.  Bilateral thoracic back pain  Gabapentin may help. Most likely related to  DDD. Cymbalta could be a good option if not better. Local heat, caution with NSAID's. F/U in 2 months.   Burning tongue syndrome  Chronic. B12 in 04/2015 was 292 (439 in 07/2013), takes B complex. Possible etiologies discussed. She would like to try Gabapentin, some side effects discussed. Avoid foods that trigger symptoms. OTC Lysine may help. F/U in 2 months.  -     gabapentin (NEURONTIN) 300 MG capsule; Take 1 capsule (300 mg total) by mouth at bedtime.      -Patient advised to return or notify a  doctor immediately if symptoms worsen or persist or new concerns arise.      Donnia Poplaski G. [place], MD  [facility]. [place] office.

## 2015-11-11 NOTE — Patient Instructions (Addendum)
A few things to remember from today's visit:   1. Hypercholesterolemia   2. Gastroesophageal reflux disease without esophagitis  - pantoprazole (PROTONIX) 40 MG tablet; Take 1 tablet (40 mg total) by mouth daily.  Dispense: 90 tablet; Refill: 1  3. INSOMNIA, CHRONIC   4. Bilateral thoracic back pain   5. Burning tongue syndrome  - gabapentin (NEURONTIN) 300 MG capsule; Take 1 capsule (300 mg total) by mouth at bedtime.  Dispense: 30 capsule; Refill: 2  6. Controlled type 2 diabetes mellitus without complication, without long-term current use of insulin (HCC)  - Hemoglobin P5P - Basic metabolic panel   Take Melatonin 5 mg and good sleep hygiene. Fall precautions.  Lysine (1500 mg) over the counter might help with tongue symptoms.  Tai Chi may help with back pain.   If you sign-up for My chart, you can communicate easier with [place] in case you have any question or concern.

## 2015-11-12 ENCOUNTER — Other Ambulatory Visit: Payer: Self-pay | Admitting: Pediatrics

## 2015-11-12 ENCOUNTER — Telehealth: Payer: Self-pay | Admitting: Pediatrics

## 2015-11-12 MED ORDER — METFORMIN HCL 500 MG PO TABS: 1000.0000 mg | ORAL_TABLET | Freq: Two times a day (BID) | ORAL | Status: DC

## 2015-11-12 NOTE — Telephone Encounter (Signed)
Pt aware refill will be sent.

## 2016-01-13 ENCOUNTER — Ambulatory Visit: Payer: Medicare Other | Admitting: Family Medicine

## 2016-02-16 ENCOUNTER — Encounter: Payer: Self-pay | Admitting: Family Medicine

## 2016-02-16 ENCOUNTER — Ambulatory Visit (INDEPENDENT_AMBULATORY_CARE_PROVIDER_SITE_OTHER): Payer: Medicare Other | Admitting: Family Medicine

## 2016-02-16 VITALS — BP 122/80 | HR 98 | Temp 98.6°F | Resp 12 | Ht 62.0 in | Wt 132.5 lb

## 2016-02-16 DIAGNOSIS — E7800 Pure hypercholesterolemia, unspecified: Secondary | ICD-10-CM

## 2016-02-16 DIAGNOSIS — G4700 Insomnia, unspecified: Secondary | ICD-10-CM

## 2016-02-16 DIAGNOSIS — K219 Gastro-esophageal reflux disease without esophagitis: Secondary | ICD-10-CM

## 2016-02-16 DIAGNOSIS — E11638 Type 2 diabetes mellitus with other oral complications: Secondary | ICD-10-CM

## 2016-02-16 DIAGNOSIS — K146 Glossodynia: Secondary | ICD-10-CM

## 2016-02-16 LAB — COMPREHENSIVE METABOLIC PANEL
ALK PHOS: 62 U/L (ref 39–117)
ALT: 10 U/L (ref 0–35)
AST: 14 U/L (ref 0–37)
Albumin: 4.1 g/dL (ref 3.5–5.2)
BILIRUBIN TOTAL: 0.3 mg/dL (ref 0.2–1.2)
BUN: 17 mg/dL (ref 6–23)
CALCIUM: 9.2 mg/dL (ref 8.4–10.5)
CO2: 29 mEq/L (ref 19–32)
CREATININE: 0.73 mg/dL (ref 0.40–1.20)
Chloride: 105 mEq/L (ref 96–112)
GFR: 81.67 mL/min (ref >60.00)
Glucose, Bld: 103 mg/dL — ABNORMAL HIGH (ref 70–99)
Potassium: 4.5 mEq/L (ref 3.5–5.1)
SODIUM: 140 meq/L (ref 135–145)
TOTAL PROTEIN: 6.7 g/dL (ref 6.0–8.3)

## 2016-02-16 LAB — LIPID PANEL
CHOL/HDL RATIO: 3
Cholesterol: 205 mg/dL — ABNORMAL HIGH (ref 0–200)
HDL: 65.7 mg/dL (ref >39.00)
LDL CALC: 121 mg/dL — AB (ref 0–99)
NONHDL: 138.96
Triglycerides: 91 mg/dL (ref 0.0–149.0)
VLDL: 18.2 mg/dL (ref 0.0–40.0)

## 2016-02-16 LAB — HEMOGLOBIN A1C: Hgb A1c MFr Bld: 6.7 % — ABNORMAL HIGH (ref 4.6–6.5)

## 2016-02-16 MED ORDER — ATORVASTATIN CALCIUM 20 MG PO TABS: 20.0000 mg | ORAL_TABLET | Freq: Every day | ORAL | 3 refills | Status: DC

## 2016-02-16 MED ORDER — PANTOPRAZOLE SODIUM 40 MG PO TBEC: 40.0000 mg | DELAYED_RELEASE_TABLET | Freq: Every day | ORAL | 3 refills | Status: DC

## 2016-02-16 MED ORDER — GABAPENTIN 300 MG PO CAPS: 300.0000 mg | ORAL_CAPSULE | Freq: Every day | ORAL | 3 refills | Status: DC

## 2016-02-16 MED ORDER — TRIAMCINOLONE ACETONIDE 0.1 % MT PSTE: 1.0000 "application " | PASTE | Freq: Two times a day (BID) | OROMUCOSAL | 3 refills | Status: DC | PRN

## 2016-02-16 MED ORDER — METFORMIN HCL 500 MG PO TABS: ORAL_TABLET | ORAL | 2 refills | Status: DC

## 2016-02-16 MED ORDER — GABAPENTIN 300 MG PO CAPS: 600.0000 mg | ORAL_CAPSULE | Freq: Every day | ORAL | 3 refills | Status: DC

## 2016-02-16 NOTE — Progress Notes (Signed)
HPI:   Melissa [name] is a 81 y.o. female, who is here today to follow on some of her chronic medical problems, as well as some problems addressed on 11/11/2015.  History of burning sensation on tip and lateral aspect of tongue. This has been going on for 5-7 years, according to patient, she has seen several specialists including ENT, dentist, and dermatologist.  Last office visit she agreed with starting Gabapentin 300 mg at bedtime, she is not reporting major benefit from the medication but she mentions that she ran out about a week ago and for the past few days burning sensation has been worse. She still eats tomatoes and chocolate, both she knows exacerbate problem.  Lesion on lateral aspect of tongue has been unchanged for years. She follows with dentist q 6 months.   -She is also requesting refill on Lipitor and Protonix.  Insomnia:  Gabapentin helping with sleep, sleeping "good." She feels rested next day.  Gabapentin also helped with back pain, which she has had for years.    Hyperlipidemia:  Currently on Atorvastatin 20 mg daily. Following a low fat diet: most of the time.  She has not noted side effects with medication.  Lab Results  Component Value Date   CHOL 172 10/17/2012   HDL 56.10 10/17/2012   LDLCALC 89 10/17/2012   LDLDIRECT 112.6 01/17/2012   TRIG 133.0 10/17/2012   CHOLHDL 3 10/17/2012     Diabetes Mellitus II:    Currently on Metformin 500 mg am and 1000 mg pm. Last OV Amaryl was discontinued.  Problem has been stable. Checking BS's : No Hypoglycemia:Denies.  She is tolerating medications well. She denies abdominal pain, nausea, vomiting, polydipsia, polyuria, or polyphagia. No feet numbness, tingling, or burning.     Lab Results  Component Value Date   CREATININE 0.83 11/11/2015   BUN 16 11/11/2015   NA 138 11/11/2015   K 4.5 11/11/2015   CL 104 11/11/2015   CO2 27 11/11/2015    Lab Results  Component Value Date     HGBA1C 6.8 (H) 11/11/2015   Lab Results  Component Value Date   MICROALBUR 1.1 04/10/2013   GERD:  Currently she is on Protonix 40 mg daily, as far as she takes medication symptoms are well controlled. Before she was on Nexium but still having regurgitation and vomiting, so she was changed to Protonix.  Still still eats pizza, not very consistent with dietary recommendations.  Denies abdominal pain, nausea, vomiting, changes in bowel habits, blood in stool or melena.  1. Large hiatal hernia.    2. Hepatic and renal cysts.    3. L2-3 degenerative disc disease with 5mm of retrolisthesis of L2 on L3.    4. No acute intra-abdominal findings   Review of Systems  Constitutional: Negative for activity change, appetite change, fatigue, fever and unexpected weight change.  HENT: Negative for dental problem, facial swelling, mouth sores, nosebleeds, trouble swallowing and voice change.   Eyes: Negative for pain, redness and visual disturbance (No new).  Respiratory: Negative for cough, shortness of breath and wheezing.   Cardiovascular: Negative for chest pain, palpitations and leg swelling.  Gastrointestinal: Negative for abdominal pain, nausea and vomiting.       Negative for changes in bowel habits.  Endocrine: Negative for polydipsia, polyphagia and polyuria.  Genitourinary: Negative for decreased urine volume, difficulty urinating, dysuria and hematuria.  Musculoskeletal: Positive for back pain. Negative for gait problem.  Skin: Negative for rash  and wound.  Neurological: Negative for seizures, syncope, facial asymmetry, weakness and headaches.  Psychiatric/Behavioral: Negative for confusion and sleep disturbance (improved). The patient is not nervous/anxious.       Current Outpatient Prescriptions on File Prior to Visit  Medication Sig Dispense Refill  . aspirin 81 MG tablet Take 81 mg by mouth daily.      Marland Kitchen b complex vitamins tablet Take 1 tablet by mouth daily.      .  calcium-vitamin D (OSCAL WITH D) 250-125 MG-UNIT per tablet Take 1 tablet by mouth daily. Reported on 11/11/2015    . metFORMIN (GLUCOPHAGE) 500 MG tablet Take 2 tablets (1,000 mg total) by mouth 2 (two) times daily. 120 tablet 11  . Multiple Vitamin (MULTIVITAMIN) tablet Take 1 tablet by mouth daily.       No current facility-administered medications on file prior to visit.      Past Medical History:  Diagnosis Date  . Colon polyps   . Diabetes mellitus   . GERD (gastroesophageal reflux disease)   . Hyperlipidemia    No Known Allergies  Social History   Social History  . Marital status: Married    Spouse name: N/A  . Number of children: N/A  . Years of education: N/A   Social History Main Topics  . Smoking status: Never Smoker  . Smokeless tobacco: Never Used  . Alcohol use No  . Drug use: Unknown  . Sexual activity: Not Asked   Other Topics Concern  . None   Social History Narrative  . None    Vitals:   02/16/16 0855  BP: 122/80  Pulse: 98  Resp: 12  Temp: 98.6 F (37 C)   Body mass index is 24.23 kg/m.      Physical Exam  Nursing note and vitals reviewed. Constitutional: She is oriented to person, place, and time. She appears well-developed and well-nourished. No distress.  HENT:  Head: Atraumatic.  Mouth/Throat: Oropharynx is clear and moist. Mucous membranes are not dry and not cyanotic.    Left lateral aspect of tongue white leukoplakic like lesion, no ulcers, no erythema, no tenderness.  Eyes: Conjunctivae and EOM are normal.  Neck: No thyromegaly present.  Cardiovascular: Normal rate and regular rhythm.   No murmur heard. Pulses:      Dorsalis pedis pulses are 2+ on the right side, and 2+ on the left side.  Respiratory: Effort normal and breath sounds normal. No respiratory distress.  GI: Soft. She exhibits no mass. There is no hepatomegaly. There is no tenderness.  Musculoskeletal: She exhibits no edema.  Lymphadenopathy:    She has no  cervical adenopathy.  Neurological: She is alert and oriented to person, place, and time. She has normal strength. Coordination normal.  Skin: Skin is warm. No erythema.  Psychiatric: Her speech is normal. Her mood appears anxious. Cognition and memory are normal.  Well groomed, good eye contact.   Foot exam 11/11/15.   ASSESSMENT AND PLAN:     Jasiah was seen today for follow-up.  Diagnoses and all orders for this visit:  Lab Results  Component Value Date   HGBA1C 6.7 (H) 02/16/2016   Lab Results  Component Value Date   CHOL 205 (H) 02/16/2016   HDL 65.70 02/16/2016   LDLCALC 121 (H) 02/16/2016   LDLDIRECT 112.6 01/17/2012   TRIG 91.0 02/16/2016   CHOLHDL 3 02/16/2016     Chemistry      Component Value Date/Time   NA 140 02/16/2016 1000  K 4.5 02/16/2016 1000   CL 105 02/16/2016 1000   CO2 29 02/16/2016 1000   BUN 17 02/16/2016 1000   CREATININE 0.73 02/16/2016 1000      Component Value Date/Time   CALCIUM 9.2 02/16/2016 1000   ALKPHOS 62 02/16/2016 1000   AST 14 02/16/2016 1000   ALT 10 02/16/2016 1000   BILITOT 0.3 02/16/2016 1000      Controlled type 2 diabetes mellitus with other oral complication, without long-term current use of insulin (HCC)  HgA1C at goal. No changes in current management. Regular exercise and healthy diet with avoidance of added sugar food intake is an important part of treatment and recommended. Annual eye exam, periodic dental and foot care recommended. F/U in 5-6 months  -     Hemoglobin A1c -     CMP  Gastroesophageal reflux disease without esophagitis  GERD Precautions discussed. Continue PPI treatment, which is currently helping with symptoms. Follow-up in 6-12 months.  -     pantoprazole (PROTONIX) 40 MG tablet; Take 1 tablet (40 mg total) by mouth daily.  Burning tongue syndrome  Chronic. It seems like Gabapentin was helping some with burning sensation. She agrees with trying increasing dose of Gabapentin from  300 mg to 600 mg at bedtime, discussed some side effects. She can also try Triamcinolone paste as needed on affected area. Avoid foods she has already identifies as aggravating factors. Monitor for changes and continue following with dentist, if any change she may need another ENT evaluation. F/U in 3-4 months.   -     gabapentin (NEURONTIN) 300 MG capsule; Take 2 capsule (300 mg total) by mouth at bedtime. -     triamcinolone (KENALOG) 0.1 % paste; Use as directed 1 application in the mouth or throat 2 (two) times daily as needed.  Pure hypercholesterolemia  No changes in current management, will follow labs done today and will give further recommendations accordingly. Low fat diet discussed. F/U in 6-12 months.  -     atorvastatin (LIPITOR) 20 MG tablet; Take 1 tablet (20 mg total) by mouth daily. -     Lipid panel  INSOMNIA, CHRONIC  Improved with Gabapentin. Good sleep hygiene. Follow-up in 6-12 months.       -Ms. Cecilie LowersMartha N Bittick was advised to return sooner than planned today if new concerns arise.       Pearse Shiffler G. [place], MD  [facility]. [place] office.

## 2016-02-16 NOTE — Patient Instructions (Signed)
A few things to remember from today's visit:   Pure hypercholesterolemia - Plan: atorvastatin (LIPITOR) 20 MG tablet, Lipid panel  Gastroesophageal reflux disease without esophagitis - Plan: pantoprazole (PROTONIX) 40 MG tablet  Burning tongue syndrome - Plan: gabapentin (NEURONTIN) 300 MG capsule, triamcinolone (KENALOG) 0.1 % paste  Controlled type 2 diabetes mellitus with other oral complication, without long-term current use of insulin (HCC) - Plan: Hemoglobin A1c, CMP  INSOMNIA, CHRONIC    Avoid foods that make your symptoms worse, for example coffee, chocolate,pepermeint,alcohol, and greasy food. Raising the head of your bed about 6 inches may help with nocturnal symptoms.   Weight loss (if you are overweight). Avoid lying down for 3 hours after eating.  Instead 3 large meals daily try small and more frequent meals during the day.  Some medications we recommend for acid reflux treatment (proton pump inhibitors) can cause some problems in the long term: increase risk of vitamin deficiencies,pneumonia, and more recently discovered that it can increase the risk of chronic kidney disease and might increase risk of dementia.   HgA1C goal < 7.5. Avoid sugar added food:regular soft drinks, energy drinks, and sports drinks. candy. cakes. cookies. pies and cobblers. sweet rolls, pastries, and donuts. fruit drinks, such as fruitades and fruit punch. dairy desserts, such as ice cream  Mediterranean diet has showed benefits for sugar control.  How much and what type of carbohydrate foods are important for managing diabetes. The balance between how much insulin is in your body and the carbohydrate you eat makes a difference in your blood glucose levels.  Fasting blood sugar ideally 140 or less, 2 hours after meals less than 180.   Regular exercise also will help with controlling disease, daily brisk walking as tolerated for 15-30 min definitively will help.   Avoid skipping meals,  blood sugar might drop and cause serious problems. Remember checking feet periodically, good dental hygiene, and annual eye exam.      Please be sure medication list is accurate. If a new problem present, please set up appointment sooner than planned today.

## 2016-05-18 ENCOUNTER — Encounter: Payer: Self-pay | Admitting: Family Medicine

## 2016-05-18 ENCOUNTER — Ambulatory Visit (INDEPENDENT_AMBULATORY_CARE_PROVIDER_SITE_OTHER): Payer: Medicare Other | Admitting: Family Medicine

## 2016-05-18 VITALS — BP 120/76 | HR 96 | Resp 12 | Ht 62.0 in | Wt 131.2 lb

## 2016-05-18 DIAGNOSIS — M546 Pain in thoracic spine: Secondary | ICD-10-CM

## 2016-05-18 DIAGNOSIS — G8929 Other chronic pain: Secondary | ICD-10-CM

## 2016-05-18 DIAGNOSIS — G4700 Insomnia, unspecified: Secondary | ICD-10-CM | POA: Diagnosis not present

## 2016-05-18 DIAGNOSIS — R350 Frequency of micturition: Secondary | ICD-10-CM

## 2016-05-18 DIAGNOSIS — K146 Glossodynia: Secondary | ICD-10-CM | POA: Diagnosis not present

## 2016-05-18 LAB — POCT URINALYSIS DIPSTICK
Bilirubin, UA: NEGATIVE
Blood, UA: NEGATIVE
GLUCOSE UA: NEGATIVE
KETONES UA: NEGATIVE
Nitrite, UA: NEGATIVE
Protein, UA: NEGATIVE
Urobilinogen, UA: 0.2
pH, UA: 6

## 2016-05-18 MED ORDER — DICLOFENAC SODIUM 1 % TD GEL: 4.0000 g | Freq: Four times a day (QID) | TRANSDERMAL | 1 refills | Status: DC

## 2016-05-18 MED ORDER — TIZANIDINE HCL 4 MG PO TABS: 4.0000 mg | ORAL_TABLET | Freq: Every evening | ORAL | 0 refills | Status: DC | PRN

## 2016-05-18 NOTE — Progress Notes (Signed)
Pre visit review using our clinic review tool, if applicable. No additional management support is needed unless otherwise documented below in the visit note. 

## 2016-05-18 NOTE — Patient Instructions (Addendum)
A few things to remember from today's visit:   Urine frequency - Plan: POCT urinalysis dipstick, Urine Culture  Burning tongue syndrome  Chronic bilateral thoracic back pain - Plan: tiZANidine (ZANAFLEX) 4 MG tablet, diclofenac sodium (VOLTAREN) 1 % GEL  Urine culture will be sent and will follow.  ? Overactive bladder.  I do not think antibiotics are needed.  Keep appointment with dermatologists.  Continue Gabapentin since it is helping with sleep. For back pain avoid activities you know make pain worse.  Local heat and tylenol may help.  Apply gel (Voltaren) on back and caution with muscle relaxant, try 1/2 tab firt.    Please be sure medication list is accurate. If a new problem present, please set up appointment sooner than planned today.

## 2016-05-18 NOTE — Progress Notes (Signed)
HPI:   Melissa [name] is a 81 y.o. female, who is here today to follow on her last OV, 02/16/16.  She has a long history of burning sensation and some areas of her tongue, last office visit Gabapentin was increased from 300 mg to 600 mg and steroid paste was also prescribed. She didn't notice any improvement in the burning sensation, so she went to see a dermatologist. According to patient she underwent a to biopsy, she was told "no cancer" found, tomorrow she has a f/u appt. In general the problem is stable, she has not noted oral lesions or new associated symptoms. She has this problemfor over 5 years. Has had ENT evaluation, dentists, and dermatologists.   According to pt, dermatologists recommended steroids but Rx was not called in.  It is exacerbated by some foods: tomatoes and chocolate. Denies tobacco use  -She has history of insomnia and now she is sleeping better after gabapentin was increased, so she would like to continue it. She has not noted any side effect, no drowsiness the following day.  -She has history of back pain, states that back still hurting, gabapentin didn't seem to help.   Pain is usually after she engages in certain activities.  Upper bilateral back pain, interscapular and occasionally she also has lumbar pain. Pain is not radiated, she denies other extremity numbness or tingling, saddle anesthesia, or urine/bowel incontinence. She denies any recent injury. According to patient, she had test done in the past and everything was fine, except for a hiatal hernia. She usually takes Excedrin and rest for a few minutes.  - Urine frequency for " a few" weeks, no dysuria, no gross hematuria. She states that she has had urinary frequency for a while, no dysuria at least once per night, but symptoms seem to be mildly worse for the past few days. + Urinary urgency sometimes. He has had UTIs in the past, according to patient she has tried different  antibiotics for UTI but the one that usually helps is Cephalexin.  She has not noted abdominal pain, nausea, vomiting, changes in bowel habits, fever, or chills.   -She also mentions that she has Hx of diverticulitis and since her last she was evaluated in a acute care facility, diagnosed with diverticulitis and treated with antibiotic. She states that one of her friends has taken metronidazole for diverticulitis and wonders if she needs a prescription. According to patient, all symptoms resolved after antibiotic treatment.     Review of Systems  Constitutional: Negative for appetite change, fatigue, fever and unexpected weight change.  HENT: Negative for mouth sores, nosebleeds, sore throat, trouble swallowing and voice change.   Respiratory: Negative for cough, shortness of breath and wheezing.   Cardiovascular: Negative for leg swelling.  Gastrointestinal: Negative for abdominal pain, blood in stool, nausea and vomiting.       Negative for changes in bowel habits.  Genitourinary: Positive for frequency. Negative for decreased urine volume, difficulty urinating, dysuria and hematuria.  Musculoskeletal: Positive for back pain.  Skin: Negative for rash and wound.  Neurological: Negative for syncope, weakness and headaches.  Psychiatric/Behavioral: Negative for confusion and sleep disturbance. The patient is nervous/anxious.       Current Outpatient Prescriptions on File Prior to Visit  Medication Sig Dispense Refill  . aspirin 81 MG tablet Take 81 mg by mouth daily.      Marland Kitchen atorvastatin (LIPITOR) 20 MG tablet Take 1 tablet (20 mg total) by mouth daily.  90 tablet 3  . b complex vitamins tablet Take 1 tablet by mouth daily.      . calcium-vitamin D (OSCAL WITH D) 250-125 MG-UNIT per tablet Take 1 tablet by mouth daily. Reported on 11/11/2015    . gabapentin (NEURONTIN) 300 MG capsule Take 2 capsules (600 mg total) by mouth at bedtime. 60 capsule 3  . metFORMIN (GLUCOPHAGE) 500 MG tablet  1 tab with breakfast and 2 tabs with supper. 270 tablet 2  . Multiple Vitamin (MULTIVITAMIN) tablet Take 1 tablet by mouth daily.      . pantoprazole (PROTONIX) 40 MG tablet Take 1 tablet (40 mg total) by mouth daily. 90 tablet 3  . triamcinolone (KENALOG) 0.1 % paste Use as directed 1 application in the mouth or throat 2 (two) times daily as needed. 5 g 3   No current facility-administered medications on file prior to visit.      Past Medical History:  Diagnosis Date  . Colon polyps   . Diabetes mellitus   . GERD (gastroesophageal reflux disease)   . Hyperlipidemia    No Known Allergies  Social History   Social History  . Marital status: Married    Spouse name: N/A  . Number of children: N/A  . Years of education: N/A   Social History Main Topics  . Smoking status: Never Smoker  . Smokeless tobacco: Never Used  . Alcohol use No  . Drug use: Unknown  . Sexual activity: Not Asked   Other Topics Concern  . None   Social History Narrative  . None    Vitals:   05/18/16 1010  BP: 120/76  Pulse: 96  Resp: 12   Body mass index is 23.99 kg/m.      Physical Exam  Nursing note and vitals reviewed. Constitutional: She is oriented to person, place, and time. She appears well-developed and well-nourished. No distress.  HENT:  Head: Atraumatic.  Mouth/Throat: Oropharynx is clear and moist and mucous membranes are normal.  Eyes: Conjunctivae and EOM are normal. Pupils are equal, round, and reactive to light.  Cardiovascular: Normal rate and regular rhythm.   No murmur heard. Pulses:      Dorsalis pedis pulses are 2+ on the right side, and 2+ on the left side.  Respiratory: Effort normal and breath sounds normal. No respiratory distress.  GI: Soft. She exhibits no mass. There is no hepatomegaly. There is no tenderness. There is no CVA tenderness.  Musculoskeletal: She exhibits no edema.  Kyphosis. Tenderness upon palpation of interscapular muscles, bilateral.    Lymphadenopathy:       Head (right side): No submandibular adenopathy present.       Head (left side): No submandibular adenopathy present.    She has no cervical adenopathy.  Neurological: She is alert and oriented to person, place, and time. She has normal strength. Coordination normal.  Stable gait, she doesn't have assistance.  Skin: Skin is warm. No erythema.  Psychiatric: Her mood appears anxious.  Well groomed, good eye contact.      ASSESSMENT AND PLAN:     Johnny BridgeMartha was seen today for follow-up.  Diagnoses and all orders for this visit:    Urine frequency  We discussed other possible causes of urinary frequency, it doesn't seem to be related with UTI. ? Overactive bladder. Decreased caffeine intake.  Urine dipstick here in the office otherwise negative except for 1+ leukocytes. We will send urine for culture and will give recommendations accordingly.  -  POCT urinalysis dipstick -     Urine Culture  Burning tongue syndrome  Stable overall. She will keep appointment with dermatologist tomorrow.  Chronic bilateral thoracic back pain  Local heat and avoidance of activities she knows exacerbate pain recommended. Discussed other treatment options, she would like to try muscle relaxant. We discussed some side effects, fall precautions. Recommend starting Zanaflex 2 mg at bedtime, do not take with Gabapentin allergies, at the beginning. I think she would benefit from PT but she started interested. Topical Voltaren may also help.  Follow-up in 3 months.  -     tiZANidine (ZANAFLEX) 4 MG tablet; Take 1 tablet (4 mg total) by mouth at bedtime as needed for muscle spasms. -     diclofenac sodium (VOLTAREN) 1 % GEL; Apply 4 g topically 4 (four) times daily.  INSOMNIA, CHRONIC  Improved. Some side effects of gabapentin discussed. Good sleep hygiene. Follow-up in 6 months.     -Ms. Cecilie LowersMartha N [name] was advised to return sooner than planned today if new  concerns arise.       Geselle Cardosa G. [place], MD  [facility]. [place] office.

## 2016-05-19 ENCOUNTER — Telehealth: Payer: Self-pay

## 2016-05-19 LAB — URINE CULTURE

## 2016-05-19 NOTE — Telephone Encounter (Signed)
Received PA request from [facility] pharmacy for Voltaren gel. PA submitted & is pending. Key:  [id number] [id number]

## 2016-05-20 NOTE — Telephone Encounter (Signed)
OTC Icy hot with Lidocaine may also help. Also try Zanaflex . Thanks, BJ

## 2016-05-20 NOTE — Telephone Encounter (Signed)
Called patient and relayed message below.  Patient verbalized understanding.

## 2016-05-20 NOTE — Telephone Encounter (Signed)
The PA for the Voltaren gel is denied. Is there something else the patient can try or should she just try the muscle relaxer?

## 2016-05-23 ENCOUNTER — Other Ambulatory Visit: Payer: Self-pay

## 2016-05-23 MED ORDER — CEPHALEXIN 500 MG PO CAPS: 500.0000 mg | ORAL_CAPSULE | Freq: Two times a day (BID) | ORAL | 0 refills | Status: DC

## 2016-05-25 ENCOUNTER — Other Ambulatory Visit: Payer: Self-pay | Admitting: Family Medicine

## 2016-05-25 DIAGNOSIS — Z1231 Encounter for screening mammogram for malignant neoplasm of breast: Secondary | ICD-10-CM

## 2016-06-25 ENCOUNTER — Other Ambulatory Visit: Payer: Self-pay | Admitting: Family Medicine

## 2016-06-25 DIAGNOSIS — M546 Pain in thoracic spine: Principal | ICD-10-CM

## 2016-06-25 DIAGNOSIS — G8929 Other chronic pain: Secondary | ICD-10-CM

## 2016-06-27 NOTE — Telephone Encounter (Signed)
Okay to refill? 

## 2016-06-29 ENCOUNTER — Ambulatory Visit
Admission: RE | Admit: 2016-06-29 | Discharge: 2016-06-29 | Disposition: A | Discharge status: Home / Self Care | Payer: Medicare Other | Source: Ambulatory Visit | Attending: Family Medicine | Admitting: Family Medicine

## 2016-06-29 DIAGNOSIS — Z1231 Encounter for screening mammogram for malignant neoplasm of breast: Secondary | ICD-10-CM

## 2016-08-30 NOTE — Progress Notes (Signed)
HPI:   [name] is a 82 y.o. female, who is here today to follow on some chronic medical problems.  Last seen on 05/18/17.  Hx of burning tongue synd,Gabapentin did not help.She follows with dermatologists,according to pt Bx ruled out malignancy. After excisional Bx of main affected area she feels like burning has improved some.  Insomnia: Gabapentin 600 mg helped with sleep,so we continue it. Still helping with sleep, sleeps about 5-6 hours,she denies side effects and feels rested next day.   Years Hx of upper back pain, bilateral,interscapular.Occasionally she also has lower back pain. Last OV Zanaflex was recommended, she is reporting improvement of pain, now it is  mild and not daily, no radiated. Exacerbated by activities like blowing leaves with a leaf blower. Alleviated by rest.   Diabetes Mellitus II:  Currently on Metformin 500 mg 2 tabs daily.. Problem has been otherwise well controlled.  Checking BS's : Not checking. Denies hypoglycemia like symptoms, has had it in the past and she drinks orange juice and feels better. She is tolerating medications well.  She denies abdominal pain, nausea, vomiting, polydipsia, polyuria, or polyphagia. She denies foot numbness, tingling, or burning.   Lab Results  Component Value Date   CREATININE 0.73 02/16/2016   BUN 17 02/16/2016   NA 140 02/16/2016   K 4.5 02/16/2016   CL 105 02/16/2016   CO2 29 02/16/2016    Lab Results  Component Value Date   HGBA1C 6.7 (H) 02/16/2016   Lab Results  Component Value Date   MICROALBUR 1.1 04/10/2013     Concerns today:   HLD: She discontinued Atorvastatin about 3 weeks ago and trying OTC Garlic pills.She wonders if back pain is caused by statin. She has tried to improve low fat diet.   Lab Results  Component Value Date   CHOL 205 (H) 02/16/2016   HDL 65.70 02/16/2016   LDLCALC 121 (H) 02/16/2016   LDLDIRECT 112.6 01/17/2012   TRIG 91.0 02/16/2016   CHOLHDL 3 02/16/2016    She has a Hx of urinary frequency,which has been discussed during prior visits. Last OV I gave her abx, Keflex,upon request, she thought she may have a UTI.No dysuria. She reports Hx of cystocele, "little ball coming out" when she stands up for prolonged period of time or with straining,coughing,or heavy lifting. She was wondering if she needs to take daily abx because urinary frequency, she has Hx of cystocele. She wonders if she needs daily Cephalexin , one of her friends take it daily for UTI.   -Varicose veins on LE, which she has had treated with saline. She is concerned about risk of developing a "blood clot." She denies edema, leg pain,or erythema. Varicose veins seem to be stable, they seems more prominent after prolonged walking and at the end of the day. They seems better with elevation of LE's.    Review of Systems  Constitutional: Negative for appetite change, fatigue and fever.  HENT: Negative for mouth sores, nosebleeds and trouble swallowing.   Respiratory: Negative for cough, shortness of breath and wheezing.   Cardiovascular: Negative for leg swelling.  Gastrointestinal: Negative for abdominal pain, nausea and vomiting.       Negative for changes in bowel habits.  Endocrine: Negative for cold intolerance, heat intolerance, polydipsia, polyphagia and polyuria.  Genitourinary: Positive for frequency. Negative for decreased urine volume, dysuria, hematuria and vaginal discharge.  Musculoskeletal: Positive for back pain. Negative for gait problem.  Skin: Negative for  rash.  Neurological: Negative for syncope, weakness and headaches.  Psychiatric/Behavioral: Positive for sleep disturbance. Negative for confusion. The patient is nervous/anxious.       Current Outpatient Prescriptions on File Prior to Visit  Medication Sig Dispense Refill  . aspirin 81 MG tablet Take 81 mg by mouth daily.      Marland Kitchen. b complex vitamins tablet Take 1 tablet by mouth daily.       . calcium-vitamin D (OSCAL WITH D) 250-125 MG-UNIT per tablet Take 1 tablet by mouth daily. Reported on 11/11/2015    . gabapentin (NEURONTIN) 300 MG capsule Take 2 capsules (600 mg total) by mouth at bedtime. 60 capsule 3  . metFORMIN (GLUCOPHAGE) 500 MG tablet 1 tab with breakfast and 2 tabs with supper. 270 tablet 2  . Multiple Vitamin (MULTIVITAMIN) tablet Take 1 tablet by mouth daily.      . pantoprazole (PROTONIX) 40 MG tablet Take 1 tablet (40 mg total) by mouth daily. 90 tablet 3  . tiZANidine (ZANAFLEX) 4 MG tablet TAKE ONE TABLET BY MOUTH AT BEDTIME FOR MUSCLE SPASMS 30 tablet 0   No current facility-administered medications on file prior to visit.      Past Medical History:  Diagnosis Date  . Colon polyps   . Diabetes mellitus   . GERD (gastroesophageal reflux disease)   . Hyperlipidemia    No Known Allergies  Social History   Social History  . Marital status: Married    Spouse name: N/A  . Number of children: N/A  . Years of education: N/A   Social History Main Topics  . Smoking status: Never Smoker  . Smokeless tobacco: Never Used  . Alcohol use No  . Drug use: Unknown  . Sexual activity: Not on file   Other Topics Concern  . Not on file   Social History Narrative  . No narrative on file    Vitals:   08/31/16 0940 08/31/16 1059  BP: 122/70   Pulse: (!) 103 92  Resp: 12   O2 sat at RA 96%. Body mass index is 23.6 kg/m.    Physical Exam  Nursing note and vitals reviewed. Constitutional: She is oriented to person, place, and time. She appears well-developed and well-nourished. No distress.  HENT:  Head: Atraumatic.  Mouth/Throat: Oropharynx is clear and moist and mucous membranes are normal.  Eyes: Conjunctivae and EOM are normal. Pupils are equal, round, and reactive to light.  Cardiovascular: Normal rate and regular rhythm.   No murmur heard. Pulses:      Dorsalis pedis pulses are 2+ on the right side, and 2+ on the left side.  Tortuous  varicose vein, lateral aspect RLE. LLE milder  Respiratory: Effort normal and breath sounds normal. No respiratory distress.  GI: Soft. She exhibits no mass. There is no hepatomegaly. There is no tenderness. There is no CVA tenderness.  Musculoskeletal: She exhibits no edema.  Kyphosis. No tenderness upon palpation of thoracic paraspinal muscles, bilateral.  Lymphadenopathy:    She has no cervical adenopathy.  Neurological: She is alert and oriented to person, place, and time. She has normal strength. Coordination normal.  Stable gait, witout assistance.  Skin: Skin is warm. No rash noted. No erythema.  Psychiatric: Her mood appears anxious.  Well groomed, good eye contact.    Diabetic foot exam:  Monofilament normal bilateral. Peripheral pulses present (DP). No calluses Right hallux and 3rd toenail hypertrophic.   ASSESSMENT AND PLAN:   Melissa BridgeMartha was seen today for medicare wellness.  Diagnoses and all orders for this visit:  Lab Results  Component Value Date   HGBA1C 6.9 (H) 08/31/2016    Lab Results  Component Value Date   MICROALBUR 2.1 (H) 08/31/2016    Lab Results  Component Value Date   CREATININE 0.87 08/31/2016   BUN 18 08/31/2016   NA 139 08/31/2016   K 4.6 08/31/2016   CL 103 08/31/2016   CO2 29 08/31/2016   Lab Results  Component Value Date   TSH 1.54 08/31/2016    Controlled type 2 diabetes mellitus with other oral complication, without long-term current use of insulin (HCC)  HgA1C pending today,it was at goal in 01/2016. No changes in current management. Regular exercise and healthy diet with avoidance of added sugar food intake is an important part of treatment and recommended. Annual eye exam, periodic dental and foot care recommended. F/U in 5-6 months  -     Hemoglobin A1c -     Basic metabolic panel -     Microalbumin / creatinine urine ratio  Chronic bilateral thoracic back pain  Improved. Continue Zanaflex, some side effects  reviewed. Try to avoid activities that exacerbate pain.  INSOMNIA, CHRONIC  Overall well controlled. Good sleep hygiene. No changes in current management. F/U in 6-12 months.  -     TSH  History of urinary frequency  Reporting Hx of cystocele. She is not interested in urology referral or treatment options. Explained daily abx is indicated for recurrent UTI,in her case she just had urinary frequency, no dysuria or other symptoms that suggest UTI.I do not think daily abx is appropriate at this time.  Varicose veins of both lower extremities  Physiopathology discussed. Reassured in regard to risk of DVT. Asymptomatic otherwise. Compression stocking may help. Skin care and LE elevation above heart level.   Hyperlipidemia associated with type 2 diabetes mellitus (HCC)  She will try low fat diet and Garlic pills for now. We discussed benefits of statins as well as some side effects. Will re-evaluate in 4-6 months.    Post-menopausal -     DG Bone Density; Future     -Ms. Melissa [name] was advised to return sooner than planned today if new concerns arise.       Betty G. [place], MD  [facility]. [place] office.

## 2016-08-31 ENCOUNTER — Encounter: Payer: Self-pay | Admitting: Family Medicine

## 2016-08-31 ENCOUNTER — Ambulatory Visit (INDEPENDENT_AMBULATORY_CARE_PROVIDER_SITE_OTHER): Payer: Medicare Other | Admitting: Family Medicine

## 2016-08-31 VITALS — BP 122/70 | HR 92 | Resp 12 | Ht 63.0 in | Wt 133.2 lb

## 2016-08-31 DIAGNOSIS — E11638 Type 2 diabetes mellitus with other oral complications: Secondary | ICD-10-CM

## 2016-08-31 DIAGNOSIS — Z87898 Personal history of other specified conditions: Secondary | ICD-10-CM

## 2016-08-31 DIAGNOSIS — Z780 Asymptomatic menopausal state: Secondary | ICD-10-CM | POA: Diagnosis not present

## 2016-08-31 DIAGNOSIS — G4700 Insomnia, unspecified: Secondary | ICD-10-CM

## 2016-08-31 DIAGNOSIS — I8393 Asymptomatic varicose veins of bilateral lower extremities: Secondary | ICD-10-CM

## 2016-08-31 DIAGNOSIS — E785 Hyperlipidemia, unspecified: Secondary | ICD-10-CM

## 2016-08-31 DIAGNOSIS — E1169 Type 2 diabetes mellitus with other specified complication: Secondary | ICD-10-CM | POA: Diagnosis not present

## 2016-08-31 DIAGNOSIS — M546 Pain in thoracic spine: Secondary | ICD-10-CM

## 2016-08-31 DIAGNOSIS — G8929 Other chronic pain: Secondary | ICD-10-CM

## 2016-08-31 LAB — BASIC METABOLIC PANEL
BUN: 18 mg/dL (ref 6–23)
CHLORIDE: 103 meq/L (ref 96–112)
CO2: 29 mEq/L (ref 19–32)
CREATININE: 0.87 mg/dL (ref 0.40–1.20)
Calcium: 9.8 mg/dL (ref 8.4–10.5)
GFR: 66.61 mL/min (ref >60.00)
Glucose, Bld: 119 mg/dL — ABNORMAL HIGH (ref 70–99)
Potassium: 4.6 mEq/L (ref 3.5–5.1)
Sodium: 139 mEq/L (ref 135–145)

## 2016-08-31 LAB — MICROALBUMIN / CREATININE URINE RATIO
Creatinine,U: 170.7 mg/dL
MICROALB UR: 2.1 mg/dL — AB (ref 0.0–1.9)
Microalb Creat Ratio: 1.2 mg/g (ref 0.0–30.0)

## 2016-08-31 LAB — TSH: TSH: 1.54 u[IU]/mL (ref 0.35–4.50)

## 2016-08-31 LAB — HEMOGLOBIN A1C: Hgb A1c MFr Bld: 6.9 % — ABNORMAL HIGH (ref 4.6–6.5)

## 2016-08-31 NOTE — Progress Notes (Signed)
Subjective:   Melissa [name] is a 82 y.o. female who presents for Medicare Annual (Subsequent) preventive examination.  Health is Good  Lives with spouse; has some heart issues Lives one level Aging in place;  3 children; son lives in [place] [address] in [place] and [place]   Diet  Eats breakfast  Then generally does not eat; unless a sandwich Evening meal consist of potatoes and meat Vegetables Likes to eat out at airport drivein in [place]  Exercise Has a little dog and she walks the dog 3 times Housekeeping and laundry  Belongs to [employer] program has not gone    Patient concerns will ask her about vein in legs and will discuss with md  HR up but states at times she feels her heart is beating hard; no smothering, no sob; no chest pain  Not all the time;  Not sure how often   Falls no One level; but does have a basement  washer and dryer in the basement  No dizziness reported   No smoking/remote smoking when young No exposure to cigarettes  NO ETOH    Hearing Screening   125Hz  250Hz  500Hz  1000Hz  2000Hz  3000Hz  4000Hz  6000Hz  8000Hz   Right ear:     100      Left ear:     100      Vision Screening Comments: Vision exam;  Dr. Luciana Axe is the retinal doctor due to hx of detached retina  DM has not hurt her eyes at all Due eye exam this year. Dr. Dione Booze  Was due last sept  Has MD started     No med issues But is taking Garliq instead of statin x 3 weeks Cardiac Risk Factors include: advanced age (>61men, >94 women);diabetes mellitus;dyslipidemia Advanced Directive has completed Bring a copy of HCPOA and LW     Objective:     Vitals: BP 122/70   Pulse (!) 103   Ht 5\' 3"  (1.6 m)   Wt 133 lb 4 oz (60.4 kg)   SpO2 96%   BMI 23.60 kg/m   Body mass index is 23.6 kg/m.   Tobacco History  Smoking Status  . Never Smoker  Smokeless Tobacco  . Never Used     Counseling given: Yes   Past Medical History:  Diagnosis Date  . Colon polyps   . Diabetes mellitus    . GERD (gastroesophageal reflux disease)   . Hyperlipidemia    Past Surgical History:  Procedure Laterality Date  . ABDOMINAL HYSTERECTOMY    . FOOT SURGERY    . RETINAL DETACHMENT SURGERY    . TONSILLECTOMY     Family History  Problem Relation Age of Onset  . Heart disease    . Breast cancer     History  Sexual Activity  . Sexual activity: Not on file    Outpatient Encounter Prescriptions as of 08/31/2016  Medication Sig  . aspirin 81 MG tablet Take 81 mg by mouth daily.    Marland Kitchen b complex vitamins tablet Take 1 tablet by mouth daily.    . calcium-vitamin D (OSCAL WITH D) 250-125 MG-UNIT per tablet Take 1 tablet by mouth daily. Reported on 11/11/2015  . diclofenac sodium (VOLTAREN) 1 % GEL Apply 4 g topically 4 (four) times daily.  Marland Kitchen gabapentin (NEURONTIN) 300 MG capsule Take 2 capsules (600 mg total) by mouth at bedtime.  . metFORMIN (GLUCOPHAGE) 500 MG tablet 1 tab with breakfast and 2 tabs with supper.  . Multiple Vitamin (  MULTIVITAMIN) tablet Take 1 tablet by mouth daily.    . pantoprazole (PROTONIX) 40 MG tablet Take 1 tablet (40 mg total) by mouth daily.  Marland Kitchen. tiZANidine (ZANAFLEX) 4 MG tablet TAKE ONE TABLET BY MOUTH AT BEDTIME FOR MUSCLE SPASMS  . triamcinolone (KENALOG) 0.1 % paste Use as directed 1 application in the mouth or throat 2 (two) times daily as needed.  Marland Kitchen. atorvastatin (LIPITOR) 20 MG tablet Take 1 tablet (20 mg total) by mouth daily. (Patient not taking: Reported on 08/31/2016)  . [DISCONTINUED] cephALEXin (KEFLEX) 500 MG capsule Take 1 capsule (500 mg total) by mouth 2 (two) times daily.   No facility-administered encounter medications on file as of 08/31/2016.     Activities of Daily Living In your present state of health, do you have any difficulty performing the following activities: 08/31/2016  Hearing? N  Vision? N  Difficulty concentrating or making decisions? N  Walking or climbing stairs? N  Dressing or bathing? N  Doing errands, shopping? N    Preparing Food and eating ? N  Using the Toilet? N  In the past six months, have you accidently leaked urine? N  Do you have problems with loss of bowel control? N  Managing your Medications? N  Managing your Finances? N  Housekeeping or managing your Housekeeping? N  Some recent data might be hidden    Patient Care Team: Betty G SwazilandJordan, MD as PCP - General (Family Medicine)    Assessment:   Exercise Activities and Dietary recommendations Current Exercise Habits: Home exercise routine, Type of exercise: walking, Time (Minutes): 20, Frequency (Times/Week): 6, Weekly Exercise (Minutes/Week): 120, Intensity: Mild  Goals    None     Fall Risk Fall Risk  08/31/2016 07/26/2013 05/14/2013 04/10/2013  Falls in the past year? No No No No   Depression Screen PHQ 2/9 Scores 08/31/2016 07/26/2013 05/14/2013 04/10/2013  PHQ - 2 Score 0 0 0 0     Cognitive Function Ad8 score 0       Immunization History  Administered Date(s) Administered  . Influenza Split 04/18/2011, 04/05/2013  . Influenza Whole 03/24/2009   Screening Tests Health Maintenance  Topic Date Due  . TETANUS/TDAP  10/26/1955  . DEXA SCAN  10/25/2001  . PNA vac Low Risk Adult (1 of 2 - PCV13) 10/25/2001  . URINE MICROALBUMIN  04/10/2014  . OPHTHALMOLOGY EXAM  08/16/2014  . INFLUENZA VACCINE  01/19/2016  . HEMOGLOBIN A1C  08/17/2016  . FOOT EXAM  11/10/2016      Plan:     To fup with Dr. SwazilandJordan today Agreed to dexa and was ordered at the breast center [place] [facility] [facility] [place]  Will schedule eye exam   During the course of the visit the patient was educated and counseled about the following appropriate screening and preventive services:   Vaccines to include Pneumoccal, Influenza, Hepatitis B, Td, Zostavax, HCV  Electrocardiogram  Cardiovascular Disease  Colorectal cancer screening  Bone density screening  Diabetes screening  Glaucoma  screening  Mammography/PAP  Nutrition counseling   Patient Instructions (the written plan) was given to the patient.   Demmi Sindt, RN  08/31/2016     I have reviewed documentation from this visit and I agree with recommendations given.  Betty G. [place], MD  [facility]. [place] office.

## 2016-08-31 NOTE — Patient Instructions (Addendum)
Ms. Melissa [name] , Thank you for taking time to come for your Medicare Wellness Visit. I appreciate your ongoing commitment to your health goals. Please review the following plan we discussed and let me know if I can assist you in the future.   Please bring a copy of your living will and Health care power of attorney  Please schedule eye apt with Dr. Katy Fitch since it was due Sept Will ask Dr. Katy Fitch about eye vitamins he would recommend   Over due screens  States she had Tetanus at [facility] at walk in clinic; Please try to check on when you had these Bone density many years ago; agrees to have this and will schedule at the breast center Just had mammogram this year in January   Had the flu [facility] at [facility] [place] Had shingles at [place] in [place]  Will check to see if she has had her pneumonia vaccines   Please [facility] [place] and ask for your history of vaccinations and bring these to the office     These are the goals we discussed: Goals    . patient          Keep maintaining your health        This is a list of the screening recommended for you and due dates:  Health Maintenance  Topic Date Due  . Tetanus Vaccine  10/26/1953  . DEXA scan (bone density measurement)  10/25/2001  . Pneumonia vaccines (1 of 2 - PCV13) 10/25/2001  . Urine Protein Check  04/10/2014  . Eye exam for diabetics  08/16/2014  . Hemoglobin A1C  08/17/2016  . Complete foot exam   11/10/2016  . Flu Shot  Completed   Prevention of falls: Remove rugs or any tripping hazards in the home Use Non slip mats in bathtubs and showers Placing grab bars next to the toilet and or shower Placing handrails on both sides of the stair way Adding extra lighting in the home.   Personal safety issues reviewed:  1. Consider starting a community watch program per [facility] 2.  Changes batteries is smoke detector and/or carbon monoxide detector  3.  If you have firearms; keep them in a safe  place 4.  Wear protection when in the sun; Always wear sunscreen or a hat; It is good to have your doctor check your skin annually or review any new areas of concern 5. Driving safety; Keep in the right lane; stay 3 car lengths behind the car in front of you on the highway; look 3 times prior to pulling out; carry your cell phone everywhere you go!    Learn about the Yellow Dot program:  The program allows first responders at your emergency to have access to who your physician is, as well as your medications and medical conditions.  Citizens requesting the Yellow Dot Packages should contact Master Corporal Nunzio Cobbs at the [facility] [phone] for the first week of the program and beginning the week after Easter citizens should contact their Scientist, physiological.       Health Maintenance for Postmenopausal Women Menopause is a normal process in which your reproductive ability comes to an end. This process happens gradually over a span of months to years, usually between the ages of 62 and 40. Menopause is complete when you have missed 12 consecutive menstrual periods. It is important to talk with your health care provider about some of  the most common conditions that affect postmenopausal women, such as heart disease, cancer, and bone loss (osteoporosis). Adopting a healthy lifestyle and getting preventive care can help to promote your health and wellness. Those actions can also lower your chances of developing some of these common conditions. What should I know about menopause? During menopause, you may experience a number of symptoms, such as:  Moderate-to-severe hot flashes.  Night sweats.  Decrease in sex drive.  Mood swings.  Headaches.  Tiredness.  Irritability.  Memory problems.  Insomnia. Choosing to treat or not to treat menopausal changes is an individual decision that you make with your health care provider. What should I know  about hormone replacement therapy and supplements? Hormone therapy products are effective for treating symptoms that are associated with menopause, such as hot flashes and night sweats. Hormone replacement carries certain risks, especially as you become older. If you are thinking about using estrogen or estrogen with progestin treatments, discuss the benefits and risks with your health care provider. What should I know about heart disease and stroke? Heart disease, heart attack, and stroke become more likely as you age. This may be due, in part, to the hormonal changes that your body experiences during menopause. These can affect how your body processes dietary fats, triglycerides, and cholesterol. Heart attack and stroke are both medical emergencies. There are many things that you can do to help prevent heart disease and stroke:  Have your blood pressure checked at least every 1-2 years. High blood pressure causes heart disease and increases the risk of stroke.  If you are 43-43 years old, ask your health care provider if you should take aspirin to prevent a heart attack or a stroke.  Do not use any tobacco products, including cigarettes, chewing tobacco, or electronic cigarettes. If you need help quitting, ask your health care provider.  It is important to eat a healthy diet and maintain a healthy weight.  Be sure to include plenty of vegetables, fruits, low-fat dairy products, and lean protein.  Avoid eating foods that are high in solid fats, added sugars, or salt (sodium).  Get regular exercise. This is one of the most important things that you can do for your health.  Try to exercise for at least 150 minutes each week. The type of exercise that you do should increase your heart rate and make you sweat. This is known as moderate-intensity exercise.  Try to do strengthening exercises at least twice each week. Do these in addition to the moderate-intensity exercise.  Know your numbers.Ask  your health care provider to check your cholesterol and your blood glucose. Continue to have your blood tested as directed by your health care provider. What should I know about cancer screening? There are several types of cancer. Take the following steps to reduce your risk and to catch any cancer development as early as possible. Breast Cancer  Practice breast self-awareness.  This means understanding how your breasts normally appear and feel.  It also means doing regular breast self-exams. Let your health care provider know about any changes, no matter how small.  If you are 70 or older, have a clinician do a breast exam (clinical breast exam or CBE) every year. Depending on your age, family history, and medical history, it may be recommended that you also have a yearly breast X-ray (mammogram).  If you have a family history of breast cancer, talk with your health care provider about genetic screening.  If you are at high  risk for breast cancer, talk with your health care provider about having an MRI and a mammogram every year.  Breast cancer (BRCA) gene test is recommended for women who have family members with BRCA-related cancers. Results of the assessment will determine the need for genetic counseling and BRCA1 and for BRCA2 testing. BRCA-related cancers include these types:  Breast. This occurs in males or females.  Ovarian.  Tubal. This may also be called fallopian tube cancer.  Cancer of the abdominal or pelvic lining (peritoneal cancer).  Prostate.  Pancreatic. Cervical, Uterine, and Ovarian Cancer  Your health care provider may recommend that you be screened regularly for cancer of the pelvic organs. These include your ovaries, uterus, and vagina. This screening involves a pelvic exam, which includes checking for microscopic changes to the surface of your cervix (Pap test).  For women ages 21-65, health care providers may recommend a pelvic exam and a Pap test every three  years. For women ages 40-65, they may recommend the Pap test and pelvic exam, combined with testing for human papilloma virus (HPV), every five years. Some types of HPV increase your risk of cervical cancer. Testing for HPV may also be done on women of any age who have unclear Pap test results.  Other health care providers may not recommend any screening for nonpregnant women who are considered low risk for pelvic cancer and have no symptoms. Ask your health care provider if a screening pelvic exam is right for you.  If you have had past treatment for cervical cancer or a condition that could lead to cancer, you need Pap tests and screening for cancer for at least 20 years after your treatment. If Pap tests have been discontinued for you, your risk factors (such as having a new sexual partner) need to be reassessed to determine if you should start having screenings again. Some women have medical problems that increase the chance of getting cervical cancer. In these cases, your health care provider may recommend that you have screening and Pap tests more often.  If you have a family history of uterine cancer or ovarian cancer, talk with your health care provider about genetic screening.  If you have vaginal bleeding after reaching menopause, tell your health care provider.  There are currently no reliable tests available to screen for ovarian cancer. Lung Cancer  Lung cancer screening is recommended for adults 81-73 years old who are at high risk for lung cancer because of a history of smoking. A yearly low-dose CT scan of the lungs is recommended if you:  Currently smoke.  Have a history of at least 30 pack-years of smoking and you currently smoke or have quit within the past 15 years. A pack-year is smoking an average of one pack of cigarettes per day for one year. Yearly screening should:  Continue until it has been 15 years since you quit.  Stop if you develop a health problem that would  prevent you from having lung cancer treatment. Colorectal Cancer  This type of cancer can be detected and can often be prevented.  Routine colorectal cancer screening usually begins at age 67 and continues through age 73.  If you have risk factors for colon cancer, your health care provider may recommend that you be screened at an earlier age.  If you have a family history of colorectal cancer, talk with your health care provider about genetic screening.  Your health care provider may also recommend using home test kits to check for hidden  blood in your stool.  A small camera at the end of a tube can be used to examine your colon directly (sigmoidoscopy or colonoscopy). This is done to check for the earliest forms of colorectal cancer.  Direct examination of the colon should be repeated every 5-10 years until age 35. However, if early forms of precancerous polyps or small growths are found or if you have a family history or genetic risk for colorectal cancer, you may need to be screened more often. Skin Cancer  Check your skin from head to toe regularly.  Monitor any moles. Be sure to tell your health care provider:  About any new moles or changes in moles, especially if there is a change in a mole's shape or color.  If you have a mole that is larger than the size of a pencil eraser.  If any of your family members has a history of skin cancer, especially at a young age, talk with your health care provider about genetic screening.  Always use sunscreen. Apply sunscreen liberally and repeatedly throughout the day.  Whenever you are outside, protect yourself by wearing long sleeves, pants, a wide-brimmed hat, and sunglasses. What should I know about osteoporosis? Osteoporosis is a condition in which bone destruction happens more quickly than new bone creation. After menopause, you may be at an increased risk for osteoporosis. To help prevent osteoporosis or the bone fractures that can  happen because of osteoporosis, the following is recommended:  If you are 64-72 years old, get at least 1,000 mg of calcium and at least 600 mg of vitamin D per day.  If you are older than age 28 but younger than age 43, get at least 1,200 mg of calcium and at least 600 mg of vitamin D per day.  If you are older than age 46, get at least 1,200 mg of calcium and at least 800 mg of vitamin D per day. Smoking and excessive alcohol intake increase the risk of osteoporosis. Eat foods that are rich in calcium and vitamin D, and do weight-bearing exercises several times each week as directed by your health care provider. What should I know about how menopause affects my mental health? Depression may occur at any age, but it is more common as you become older. Common symptoms of depression include:  Low or sad mood.  Changes in sleep patterns.  Changes in appetite or eating patterns.  Feeling an overall lack of motivation or enjoyment of activities that you previously enjoyed.  Frequent crying spells. Talk with your health care provider if you think that you are experiencing depression. What should I know about immunizations? It is important that you get and maintain your immunizations. These include:  Tetanus, diphtheria, and pertussis (Tdap) booster vaccine.  Influenza every year before the flu season begins.  Pneumonia vaccine.  Shingles vaccine. Your health care provider may also recommend other immunizations. This information is not intended to replace advice given to you by your health care provider. Make sure you discuss any questions you have with your health care provider. Document Released: 07/29/2005 Document Revised: 12/25/2015 Document Reviewed: 03/10/2015 Elsevier Interactive Patient Education  2017 [employer] Prevention in the Home Falls can cause injuries and can affect people from all age groups. There are many simple things that you can do to make your  home safe and to help prevent falls. What can I do on the outside of my home?  Regularly repair the edges of walkways  and driveways and fix any cracks.  Remove high doorway thresholds.  Trim any shrubbery on the main path into your home.  Use bright outdoor lighting.  Clear walkways of debris and clutter, including tools and rocks.  Regularly check that handrails are securely fastened and in good repair. Both sides of any steps should have handrails.  Install guardrails along the edges of any raised decks or porches.  Have leaves, snow, and ice cleared regularly.  Use sand or salt on walkways during winter months.  In the garage, clean up any spills right away, including grease or oil spills. What can I do in the bathroom?  Use night lights.  Install grab bars by the toilet and in the tub and shower. Do not use towel bars as grab bars.  Use non-skid mats or decals on the floor of the tub or shower.  If you need to sit down while you are in the shower, use a plastic, non-slip stool.  Keep the floor dry. Immediately clean up any water that spills on the floor.  Remove soap buildup in the tub or shower on a regular basis.  Attach bath mats securely with double-sided non-slip rug tape.  Remove throw rugs and other tripping hazards from the floor. What can I do in the bedroom?  Use night lights.  Make sure that a bedside light is easy to reach.  Do not use oversized bedding that drapes onto the floor.  Have a firm chair that has side arms to use for getting dressed.  Remove throw rugs and other tripping hazards from the floor. What can I do in the kitchen?  Clean up any spills right away.  Avoid walking on wet floors.  Place frequently used items in easy-to-reach places.  If you need to reach for something above you, use a sturdy step stool that has a grab bar.  Keep electrical cables out of the way.  Do not use floor polish or wax that makes floors slippery.  If you have to use wax, make sure that it is non-skid floor wax.  Remove throw rugs and other tripping hazards from the floor. What can I do in the stairways?  Do not leave any items on the stairs.  Make sure that there are handrails on both sides of the stairs. Fix handrails that are broken or loose. Make sure that handrails are as long as the stairways.  Check any carpeting to make sure that it is firmly attached to the stairs. Fix any carpet that is loose or worn.  Avoid having throw rugs at the top or bottom of stairways, or secure the rugs with carpet tape to prevent them from moving.  Make sure that you have a light switch at the top of the stairs and the bottom of the stairs. If you do not have them, have them installed. What are some other fall prevention tips?  Wear closed-toe shoes that fit well and support your feet. Wear shoes that have rubber soles or low heels.  When you use a stepladder, make sure that it is completely opened and that the sides are firmly locked. Have someone hold the ladder while you are using it. Do not climb a closed stepladder.  Add color or contrast paint or tape to grab bars and handrails in your home. Place contrasting color strips on the first and last steps.  Use mobility aids as needed, such as canes, walkers, scooters, and crutches.  Turn on lights if it is  dark. Replace any light bulbs that burn out.  Set up furniture so that there are clear paths. Keep the furniture in the same spot.  Fix any uneven floor surfaces.  Choose a carpet design that does not hide the edge of steps of a stairway.  Be aware of any and all pets.  Review your medicines with your healthcare provider. Some medicines can cause dizziness or changes in blood pressure, which increase your risk of falling. Talk with your health care provider about other ways that you can decrease your risk of falls. This may include working with a physical therapist or trainer to improve  your strength, balance, and endurance. This information is not intended to replace advice given to you by your health care provider. Make sure you discuss any questions you have with your health care provider. Document Released: 05/27/2002 Document Revised: 11/03/2015 Document Reviewed: 07/11/2014 Elsevier Interactive Patient Education  2017 [employer].

## 2016-09-01 ENCOUNTER — Encounter: Payer: Self-pay | Admitting: Family Medicine

## 2016-09-28 ENCOUNTER — Other Ambulatory Visit: Payer: Self-pay | Admitting: Family Medicine

## 2016-09-28 DIAGNOSIS — M546 Pain in thoracic spine: Principal | ICD-10-CM

## 2016-09-28 DIAGNOSIS — G8929 Other chronic pain: Secondary | ICD-10-CM

## 2016-10-10 ENCOUNTER — Ambulatory Visit (INDEPENDENT_AMBULATORY_CARE_PROVIDER_SITE_OTHER): Payer: Medicare Other | Admitting: Family Medicine

## 2016-10-10 ENCOUNTER — Encounter: Payer: Self-pay | Admitting: Family Medicine

## 2016-10-10 VITALS — BP 120/70 | HR 100 | Resp 12 | Ht 63.0 in | Wt 134.2 lb

## 2016-10-10 DIAGNOSIS — M25561 Pain in right knee: Secondary | ICD-10-CM

## 2016-10-10 DIAGNOSIS — I8393 Asymptomatic varicose veins of bilateral lower extremities: Secondary | ICD-10-CM | POA: Diagnosis not present

## 2016-10-10 DIAGNOSIS — M79604 Pain in right leg: Secondary | ICD-10-CM | POA: Diagnosis not present

## 2016-10-10 NOTE — Patient Instructions (Addendum)
A few things to remember from today's visit:   Pain of right lower extremity  Right knee pain, unspecified chronicity  Varicose veins of both lower extremities  ? various veins, knee osteoarthritis,   Let me know if you want to go to vascular.   Please be sure medication list is accurate. If a new problem present, please set up appointment sooner than planned today.

## 2016-10-10 NOTE — Progress Notes (Signed)
Pre visit review using our clinic review tool, if applicable. No additional management support is needed unless otherwise documented below in the visit note. 

## 2016-10-10 NOTE — Progress Notes (Signed)
HPI:   Melissa [name] is a 82 y.o. female, who is here today complaining of right LE pain.  She has had some leg discomfort for months now , tightness sensation. She states that for the past week it has been severe,intermittent pain that starts "somewhere" in the knee, mainly anterior and radiated to lateral or anterior aspect to dorsum of right foot. Occasionally  pain also radiated to posterior aspect of thigh to buttock. Denies associated lower back pain but in the past she has c/o mid and upper back pain.   No recent trauma,no edema or erythema.  Knee pain is exacerbated by going up and down stairs. She states that a couple nights ago it was "bad" while she was in bed. She used OTC "spray" medication and Excedrin, both seemed to help. Also alleviated by rest.  Denies numbness or tinging, no saddle anesthesia or changes in urine or bowel continence. Wonders if she has gout or if related to vein disease. Also concerned about "blood clots"  Denies chest pain, dyspnea, palpitation, claudication, focal weakness, or edema.  She has been on Gabapentin before to treat tongue burning sensation, discontinued because it did not help.   Hx of DM II, she is on Metformin 500 mg 1500 mg daily.  Lab Results  Component Value Date   HGBA1C 6.9 (H) 08/31/2016   Lab Results  Component Value Date   CREATININE 0.87 08/31/2016   BUN 18 08/31/2016   NA 139 08/31/2016   K 4.6 08/31/2016   CL 103 08/31/2016   CO2 29 08/31/2016     Review of Systems  Constitutional: Negative for appetite change, fatigue and fever.  HENT: Negative for mouth sores, nosebleeds and trouble swallowing.   Respiratory: Negative for cough, shortness of breath and wheezing.   Cardiovascular: Negative for chest pain, palpitations and leg swelling.  Gastrointestinal: Negative for abdominal pain, nausea and vomiting.       Negative  for changes in bowel habits.  Genitourinary: Negative for decreased urine volume and hematuria.  Musculoskeletal: Positive for arthralgias and myalgias. Negative for back pain and gait problem.  Skin: Negative for color change, pallor, rash and wound.  Neurological: Negative for syncope, weakness, numbness and headaches.  Psychiatric/Behavioral: Positive for sleep disturbance. Negative for confusion. The patient is nervous/anxious.       Current Outpatient Prescriptions on File Prior to Visit  Medication Sig Dispense Refill  . aspirin 81 MG tablet Take 81 mg by mouth daily.      Marland Kitchen b complex vitamins tablet Take 1 tablet by mouth daily.      . calcium-vitamin D (OSCAL WITH D) 250-125 MG-UNIT per tablet Take 1 tablet by mouth daily. Reported on 11/11/2015    . gabapentin (NEURONTIN) 300 MG capsule Take 2 capsules (600 mg total) by mouth at bedtime. 60 capsule 3  . metFORMIN (GLUCOPHAGE) 500 MG tablet 1 tab with breakfast and 2 tabs with supper. 270 tablet 2  . Multiple Vitamin (MULTIVITAMIN) tablet Take 1 tablet by mouth daily.      . pantoprazole (PROTONIX) 40 MG tablet Take 1 tablet (40 mg total) by mouth daily. 90 tablet 3  . tiZANidine (ZANAFLEX) 4 MG tablet TAKE ONE TABLET BY MOUTH AT BEDTIME FOR MUSCLE SPASM 30 tablet 3   No current facility-administered medications on file prior to visit.      Past Medical History:  Diagnosis Date  . Colon polyps   . Diabetes mellitus   . GERD (gastroesophageal reflux disease)   . Hyperlipidemia    No Known Allergies  Social History   Social History  . Marital status: Married    Spouse name: N/A  . Number of children: N/A  . Years of education: N/A   Social History Main Topics  . Smoking status: Never Smoker  . Smokeless tobacco: Never Used  . Alcohol use No  . Drug use: No  . Sexual activity: Not Asked   Other Topics Concern  . None   Social History Narrative  . None    Vitals:   10/10/16 1122  BP: 120/70  Pulse: 100    Resp: 12  O2 sat at RA 97% Body mass index is 23.78 kg/m.  Physical Exam  Nursing note and vitals reviewed. Constitutional: She is oriented to person, place, and time. She appears well-developed and well-nourished. No distress.  HENT:  Head: Atraumatic.  Eyes: Conjunctivae and EOM are normal.  Cardiovascular: Normal rate and regular rhythm.   No murmur heard. Pulses:      Dorsalis pedis pulses are 2+ on the right side, and 2+ on the left side.       Posterior tibial pulses are 2+ on the right side, and 2+ on the left side.  Tortuous varicose vein, bilateral but RLE>> LLE   Respiratory: Effort normal and breath sounds normal. No respiratory distress.  GI: Soft. She exhibits no mass. There is no hepatomegaly. There is no tenderness.  Musculoskeletal: She exhibits no edema or tenderness.       Right hip: She exhibits normal range of motion and no tenderness.       Right knee: She exhibits effusion (mild). She exhibits normal range of motion and no deformity.       Lumbar back: She exhibits no tenderness.  Neurological: She is alert and oriented to person, place, and time. She has normal strength. Coordination normal.  Stable gait, witout assistance.  Skin: Skin is warm. No abrasion and no rash noted. No cyanosis or erythema.  Psychiatric: Her mood appears anxious.  Well groomed, good eye contact.    ASSESSMENT AND PLAN:   Melissa [name] was seen today for right leg pain.  Diagnoses and all orders for this visit:  Pain of right lower extremity  We discussed possible causes, today examination does not suggest a serious process. DP and TP pulses present, reassure in regard to DVT. ? Radicular. Knee OA and vein disease can explain most of symptoms. We could consider resuming Gabapentin if problem persists, she tolerated medication well before. Instructed about warning signs and f/u as needed.  Right knee pain, unspecified chronicity  Most likely OA, no Hx of trauma ,so I do nit  think imaging is needed today. I proposed topical Voltaren but she feels like OTC topical medication has helped some. Fall prevention discussed, avoid some of activities that could exacerbate pain:kneeling,going up/down stairs.  Varicose veins of both lower extremities  Distal leg pain seems more related to vein disease.  She has been concerned about varicose veins for a few months now, this was addressed last OV. I recommended consultation with vascular but she prefers to hold on referral. LE elevation and compression stockings may also help.    27 min OV, > 50% dedicated to discussion of differential Dx,treatment options, and coordination of care.She is very anxious and needed reassurance, explained that based on Hx and my examination today DVT  or PAD are unlikely. Still she was educated about possible complications and  to monitor for warning signs.    -Melissa [name] was advised to return or notify a doctor immediately if symptoms worsen or persist or new concerns arise.       Haeley Fordham G. [place], MD  [facility]. [place] [facility].

## 2017-03-07 NOTE — Progress Notes (Signed)
HPI:   [name] is a 82 y.o. female, who is here today for 6 months follow up.   She was last seen on 10/10/16 for acute visit, RLE pain.   Diabetes Mellitus II:    Currently on Metformin 500 mg bid.  Checking BS's : Not checking. Hypoglycemia:Denies Last eye exam a year ago. According to pt, she was instructed to follow every 2 years.Next appt 08/2017. She is tolerating medications well. She denies abdominal pain, nausea, vomiting, polydipsia, polyuria, or polyphagia. Denies feet numbness, tingling, or burning.  Lab Results  Component Value Date   CREATININE 0.87 08/31/2016   BUN 18 08/31/2016   NA 139 08/31/2016   K 4.6 08/31/2016   CL 103 08/31/2016   CO2 29 08/31/2016    Lab Results  Component Value Date   HGBA1C 6.9 (H) 08/31/2016   Lab Results  Component Value Date   MICROALBUR 2.1 (H) 08/31/2016     Hyperlipidemia:  Currently on non pharmacologic treatment. He was on Lipitor until 5-6 months ago. Following a low fat diet: Not consistently.Marland Kitchen  He did not noted side effects with medication.  Lab Results  Component Value Date   CHOL 205 (H) 02/16/2016   HDL 65.70 02/16/2016   LDLCALC 121 (H) 02/16/2016   LDLDIRECT 112.6 01/17/2012   TRIG 91.0 02/16/2016   CHOLHDL 3 02/16/2016    Insomnia:  Currently on Gabapentin 600 mg at bedtime prn.It helps her "relax" when she takes it.  Sleeping about 5 hours, more if she take Gabapentin but does not want to take it daily. She denies side effects.  Concerns today:  She is concerned about tongue stinging/burning sensation, affecting mainly left side but having occasional symptoms on right edge of tingue.  L-Lysine and green tea caps have helped with burning and discomfort.  She thinks lesion on her tongue is "leukoplakia." She tells me that her daughter is going back to nursing school and found this information on line, symptoms are similar to hers. States that she smoked for short period  in the past.  She is asking about Vit A supplementation and if it would help with symptoms.  According to patient, she has had this problem for about 10-12 years.  She has seen ENT, dentist, and dermatologist.  A few months ago she was evaluated by a dermatologist again, according to patient, biopsy was performed and no further follow-up was recommended. Burning and stinging seems to be exacerbated by certain foods like tomatoes and chocolate.  She takes Gabapentin as needed for insomnia but she did n't want to take it daily to see if it helps with burning sensation.She tried to take a dose during the day but caused drowsiness.   She is also requesting Lyme disease test. She is reporting about "11 deer tick bites" at the beginning of the summer. Mainly on abdomen and upper back.  She denies any rash or unusual fatigue, numbness or tingling.  Review of Systems  Constitutional: Positive for fatigue. Negative for activity change, appetite change and fever.  HENT: Negative for facial swelling, mouth sores, nosebleeds, sore throat and trouble swallowing.   Eyes: Negative for redness and visual disturbance.  Respiratory: Negative for cough, shortness of breath and wheezing.   Cardiovascular: Negative for chest pain, palpitations and leg swelling.  Gastrointestinal: Negative for abdominal pain, nausea and vomiting.       Negative for changes in bowel habits.  Endocrine: Negative for polydipsia, polyphagia and polyuria.  Genitourinary: Negative for decreased urine volume, dysuria and hematuria.  Musculoskeletal: Negative for gait problem and myalgias.  Skin: Negative for rash and wound.  Neurological: Negative for syncope, weakness and headaches.  Psychiatric/Behavioral: Positive for sleep disturbance. Negative for confusion. The patient is nervous/anxious.       Current Outpatient Prescriptions on File Prior to Visit  Medication Sig Dispense Refill  . aspirin 81 MG tablet Take 81 mg by  mouth daily.      Marland Kitchen b complex vitamins tablet Take 1 tablet by mouth daily.      . calcium-vitamin D (OSCAL WITH D) 250-125 MG-UNIT per tablet Take 1 tablet by mouth daily. Reported on 11/11/2015    . gabapentin (NEURONTIN) 300 MG capsule Take 2 capsules (600 mg total) by mouth at bedtime. 60 capsule 3  . metFORMIN (GLUCOPHAGE) 500 MG tablet 1 tab with breakfast and 2 tabs with supper. 270 tablet 2  . Multiple Vitamin (MULTIVITAMIN) tablet Take 1 tablet by mouth daily.      . pantoprazole (PROTONIX) 40 MG tablet Take 1 tablet (40 mg total) by mouth daily. 90 tablet 3  . tiZANidine (ZANAFLEX) 4 MG tablet TAKE ONE TABLET BY MOUTH AT BEDTIME FOR MUSCLE SPASM 30 tablet 3   No current facility-administered medications on file prior to visit.      Past Medical History:  Diagnosis Date  . Colon polyps   . Diabetes mellitus   . GERD (gastroesophageal reflux disease)   . Hyperlipidemia    No Known Allergies  Social History   Social History  . Marital status: Married    Spouse name: N/A  . Number of children: N/A  . Years of education: N/A   Social History Main Topics  . Smoking status: Never Smoker  . Smokeless tobacco: Never Used  . Alcohol use No  . Drug use: No  . Sexual activity: Not Asked   Other Topics Concern  . None   Social History Narrative  . None    Vitals:   03/08/17 0949  BP: 126/70  Pulse: 94  Resp: 12  SpO2: 96%   Body mass index is 23.4 kg/m.   Physical Exam  Nursing note and vitals reviewed. Constitutional: She is oriented to person, place, and time. She appears well-developed and well-nourished. No distress.  HENT:  Head: Normocephalic and atraumatic.  Mouth/Throat: Oropharynx is clear and moist and mucous membranes are normal.  About 3 mm minimally whitish lesion on left lateral aspect of tongue.Borders are not well define.  Eyes: Conjunctivae are normal. Right pupil is not round and not reactive. Pupils are unequal.  Cardiovascular: Normal  rate and regular rhythm.   No murmur heard. Pulses:      Dorsalis pedis pulses are 2+ on the right side, and 2+ on the left side.  Respiratory: Effort normal and breath sounds normal. No respiratory distress.  GI: Soft. She exhibits no mass. There is no hepatomegaly. There is no tenderness.  Musculoskeletal: She exhibits no edema.  Lymphadenopathy:    She has no cervical adenopathy.  Neurological: She is alert and oriented to person, place, and time. She has normal strength. Coordination and gait normal.  Skin: Skin is warm. No erythema.  Psychiatric: Her mood appears anxious.  Well groomed, good eye contact.   Diabetic Foot Exam - Simple   Simple Foot Form Diabetic Foot exam was performed with the following findings:  Yes 03/08/2017 10:44 AM  Visual Inspection See comments:  Yes Sensation Testing Intact to touch  and monofilament testing bilaterally:  Yes Pulse Check Posterior Tibialis and Dorsalis pulse intact bilaterally:  Yes Comments Some hypertrophic toenails and calluses on feet.      ASSESSMENT AND PLAN:   Ms. Melissa [name] was seen today for 6 months follow-up.  Diagnoses and all orders for this visit:  Lab Results  Component Value Date   CHOL 285 (H) 03/08/2017   HDL 74.20 03/08/2017   LDLCALC 173 (H) 03/08/2017   LDLDIRECT 112.6 01/17/2012   TRIG 186.0 (H) 03/08/2017   CHOLHDL 4 03/08/2017   Lab Results  Component Value Date   HGBA1C 6.6 (H) 03/08/2017   Lab Results  Component Value Date   MICROALBUR 2.5 (H) 03/08/2017    Controlled type 2 diabetes mellitus with other oral complication, without long-term current use of insulin (HCC)  HgA1C has been at goal. No changes in current management, meds will be adjusted if needed according to HgA1C. Regular exercise and healthy diet with avoidance of added sugar food intake is an important part of treatment and recommended. Annual eye exam, periodic dental and foot care recommended. F/U in 5-6 months  -      Basic metabolic panel -     Microalbumin / creatinine urine ratio -     Fructosamine -     Hemoglobin A1c  INSOMNIA, CHRONIC  Stable. Good sleep hygiene. Continue Gabapentin.  Burning tongue syndrome  Dx discussed as well as treatment options.  -     Ambulatory referral to ENT  Tick bite of abdomen, initial encounter  Further recommendations will be given according to lab results. Preventive measures discussed.  -     B. burgdorfi Antibody  Hyperlipidemia associated with type 2 diabetes mellitus (HCC)  No changes in current management,non pharmacologic. We dicussed benefits of statin meds. We will follow labs done today and will give further recommendations accordingly.  -     Lipid panel  Lesion of tongue  She would like ENT evaluation.  -     Ambulatory referral to ENT       -Ms. Melissa [name] was advised to return sooner than planned today if new concerns arise.       Betty G. [place], MD  [facility]. [place] [facility].

## 2017-03-08 ENCOUNTER — Encounter: Payer: Self-pay | Admitting: Family Medicine

## 2017-03-08 ENCOUNTER — Ambulatory Visit (INDEPENDENT_AMBULATORY_CARE_PROVIDER_SITE_OTHER): Payer: Medicare Other | Admitting: Family Medicine

## 2017-03-08 VITALS — BP 126/70 | HR 94 | Resp 12 | Ht 63.0 in | Wt 132.1 lb

## 2017-03-08 DIAGNOSIS — E1169 Type 2 diabetes mellitus with other specified complication: Secondary | ICD-10-CM | POA: Diagnosis not present

## 2017-03-08 DIAGNOSIS — E785 Hyperlipidemia, unspecified: Secondary | ICD-10-CM

## 2017-03-08 DIAGNOSIS — S30861A Insect bite (nonvenomous) of abdominal wall, initial encounter: Secondary | ICD-10-CM

## 2017-03-08 DIAGNOSIS — E11638 Type 2 diabetes mellitus with other oral complications: Secondary | ICD-10-CM

## 2017-03-08 DIAGNOSIS — G4700 Insomnia, unspecified: Secondary | ICD-10-CM | POA: Diagnosis not present

## 2017-03-08 DIAGNOSIS — W57XXXA Bitten or stung by nonvenomous insect and other nonvenomous arthropods, initial encounter: Secondary | ICD-10-CM

## 2017-03-08 DIAGNOSIS — K148 Other diseases of tongue: Secondary | ICD-10-CM | POA: Diagnosis not present

## 2017-03-08 DIAGNOSIS — K146 Glossodynia: Secondary | ICD-10-CM | POA: Diagnosis not present

## 2017-03-08 LAB — BASIC METABOLIC PANEL
BUN: 16 mg/dL (ref 6–23)
CHLORIDE: 103 meq/L (ref 96–112)
CO2: 29 meq/L (ref 19–32)
Calcium: 9.6 mg/dL (ref 8.4–10.5)
Creatinine, Ser: 0.86 mg/dL (ref 0.40–1.20)
GFR: 67.42 mL/min (ref >60.00)
Glucose, Bld: 105 mg/dL — ABNORMAL HIGH (ref 70–99)
Potassium: 4.4 mEq/L (ref 3.5–5.1)
Sodium: 138 mEq/L (ref 135–145)

## 2017-03-08 LAB — LIPID PANEL
CHOLESTEROL: 285 mg/dL — AB (ref 0–200)
HDL: 74.2 mg/dL (ref >39.00)
LDL Cholesterol: 173 mg/dL — ABNORMAL HIGH (ref 0–99)
NonHDL: 210.31
TRIGLYCERIDES: 186 mg/dL — AB (ref 0.0–149.0)
Total CHOL/HDL Ratio: 4
VLDL: 37.2 mg/dL (ref 0.0–40.0)

## 2017-03-08 LAB — HEMOGLOBIN A1C: HEMOGLOBIN A1C: 6.6 % — AB (ref 4.6–6.5)

## 2017-03-08 LAB — MICROALBUMIN / CREATININE URINE RATIO
CREATININE, U: 159.1 mg/dL
MICROALB UR: 2.5 mg/dL — AB (ref 0.0–1.9)
MICROALB/CREAT RATIO: 1.6 mg/g (ref 0.0–30.0)

## 2017-03-08 NOTE — Patient Instructions (Addendum)
A few things to remember from today's visit:   Controlled type 2 diabetes mellitus with other oral complication, without long-term current use of insulin (HCC) - Plan: Basic metabolic panel, Microalbumin / creatinine urine ratio, Fructosamine, Hemoglobin A1c  INSOMNIA, CHRONIC  Burning tongue syndrome  Tick bite of abdomen, initial encounter - Plan: B. burgdorfi Antibody  Hyperlipidemia associated with type 2 diabetes mellitus (HCC) - Plan: Lipid panel  Fat and Cholesterol Restricted Diet High levels of fat and cholesterol in your blood may lead to various health problems, such as diseases of the heart, blood vessels, gallbladder, liver, and pancreas. Fats are concentrated sources of energy that come in various forms. Certain types of fat, including saturated fat, may be harmful in excess. Cholesterol is a substance needed by your body in small amounts. Your body makes all the cholesterol it needs. Excess cholesterol comes from the food you eat. When you have high levels of cholesterol and saturated fat in your blood, health problems can develop because the excess fat and cholesterol will gather along the walls of your blood vessels, causing them to narrow. Choosing the right foods will help you control your intake of fat and cholesterol. This will help keep the levels of these substances in your blood within normal limits and reduce your risk of disease.  What types of fat should I choose?  Choose healthy fats more often. Choose monounsaturated and polyunsaturated fats, such as olive and canola oil, flaxseeds, walnuts, almonds, and seeds.  Eat more omega-3 fats. Good choices include salmon, mackerel, sardines, tuna, flaxseed oil, and ground flaxseeds. Aim to eat fish at least two times a week.  Limit saturated fats. Saturated fats are primarily found in animal products, such as meats, butter, and cream. Plant sources of saturated fats include palm oil, palm kernel oil, and coconut  oil.  Avoid foods with partially hydrogenated oils in them. These contain trans fats. Examples of foods that contain trans fats are stick margarine, some tub margarines, cookies, crackers, and other baked goods. What general guidelines do I need to follow? These guidelines for healthy eating will help you control your intake of fat and cholesterol:  Check food labels carefully to identify foods with trans fats or high amounts of saturated fat.  Fill one half of your plate with vegetables and green salads.  Fill one fourth of your plate with whole grains. Look for the word "whole" as the first word in the ingredient list.  Fill one fourth of your plate with lean protein foods.  Limit fruit to two servings a day. Choose fruit instead of juice.  Eat more foods that contain fiber, such as apples, broccoli, carrots, beans, peas, and barley.  Eat more home-cooked food and less restaurant, buffet, and fast food.  Limit or avoid alcohol.  Limit foods high in starch and sugar.  Limit fried foods.  Cook foods using methods other than frying. Baking, boiling, grilling, and broiling are all great options.  Lose weight if you are overweight. Losing just 5-10% of your initial body weight can help your overall health and prevent diseases such as diabetes and heart disease.  What foods can I eat? Grains  Whole grains, such as whole wheat or whole grain breads, crackers, cereals, and pasta. Unsweetened oatmeal, bulgur, barley, quinoa, or brown rice. Corn or whole wheat flour tortillas. Vegetables  Fresh or frozen vegetables (raw, steamed, roasted, or grilled). Green salads. Fruits  All fresh, canned (in natural juice), or frozen fruits. Meats and other  protein foods  Ground beef (85% or leaner), grass-fed beef, or beef trimmed of fat. Skinless chicken or [place]. Ground chicken or [place]. Pork trimmed of fat. All fish and seafood. Eggs. Dried beans, peas, or lentils. Unsalted nuts or seeds.  Unsalted canned or dry beans. Dairy  Low-fat dairy products, such as skim or 1% milk, 2% or reduced-fat cheeses, low-fat ricotta or cottage cheese, or plain low-fat yo Fats and oils  Tub margarines without trans fats. Light or reduced-fat mayonnaise and salad dressings. Avocado. Olive, canola, sesame, or safflower oils. Natural peanut or almond butter (choose ones without added sugar and oil). The items listed above may not be a complete list of recommended foods or beverages. Contact your dietitian for more options. Foods to avoid Grains  White bread. White pasta. White rice. Cornbread. Bagels, pastries, and croissants. Crackers that contain trans fat. Vegetables  White potatoes. Corn. Creamed or fried vegetables. Vegetables in a cheese sauce. Fruits  Dried fruits. Canned fruit in light or heavy syrup. Fruit juice. Meats and other protein foods  Fatty cuts of meat. Ribs, chicken wings, bacon, sausage, bologna, salami, chitterlings, fatback, hot dogs, bratwurst, and packaged luncheon meats. Liver and organ meats. Dairy  Whole or 2% milk, cream, half-and-half, and cream cheese. Whole milk cheeses. Whole-fat or sweetened yogurt. Full-fat cheeses. Nondairy creamers and whipped toppings. Processed cheese, cheese spreads, or cheese curds. Beverages  Alcohol. Sweetened drinks (such as sodas, lemonade, and fruit drinks or punches). Fats and oils  Butter, stick margarine, lard, shortening, ghee, or bacon fat. Coconut, palm kernel, or palm oils. Sweets and desserts  Corn syrup, sugars, honey, and molasses. Candy. Jam and jelly. Syrup. Sweetened cereals. Cookies, pies, cakes, donuts, muffins, and ice cream. The items listed above may not be a complete list of foods and beverages to avoid. Contact your dietitian for more information. This information is not intended to replace advice given to you by your health care provider. Make sure you discuss any questions you have with your health care  provider. Document Released: 06/06/2005 Document Revised: 06/27/2014 Document Reviewed: 09/04/2013 Elsevier Interactive Patient Education  2017 [employer]  Please be sure medication list is accurate. If a new problem present, please set up appointment sooner than planned today.

## 2017-03-10 LAB — B. BURGDORFI ANTIBODIES: B burgdorferi Ab IgG+IgM: <0.9 index

## 2017-03-10 LAB — FRUCTOSAMINE: FRUCTOSAMINE: 257 umol/L (ref 190–270)

## 2017-05-04 ENCOUNTER — Other Ambulatory Visit: Payer: Self-pay | Admitting: Family Medicine

## 2017-05-04 DIAGNOSIS — K219 Gastro-esophageal reflux disease without esophagitis: Secondary | ICD-10-CM

## 2017-05-18 ENCOUNTER — Other Ambulatory Visit: Payer: Self-pay | Admitting: Family Medicine

## 2017-05-18 DIAGNOSIS — E11638 Type 2 diabetes mellitus with other oral complications: Secondary | ICD-10-CM

## 2017-06-07 ENCOUNTER — Telehealth: Payer: Self-pay | Admitting: Family Medicine

## 2017-06-07 ENCOUNTER — Encounter: Payer: Self-pay | Admitting: Family Medicine

## 2017-06-07 ENCOUNTER — Ambulatory Visit: Payer: Medicare Other | Admitting: Family Medicine

## 2017-06-07 VITALS — BP 122/66 | HR 93 | Temp 98.5°F | Resp 12 | Ht 63.0 in | Wt 133.0 lb

## 2017-06-07 DIAGNOSIS — R0789 Other chest pain: Secondary | ICD-10-CM

## 2017-06-07 DIAGNOSIS — R200 Anesthesia of skin: Secondary | ICD-10-CM

## 2017-06-07 DIAGNOSIS — R0989 Other specified symptoms and signs involving the circulatory and respiratory systems: Secondary | ICD-10-CM

## 2017-06-07 DIAGNOSIS — R3915 Urgency of urination: Secondary | ICD-10-CM | POA: Diagnosis not present

## 2017-06-07 DIAGNOSIS — K219 Gastro-esophageal reflux disease without esophagitis: Secondary | ICD-10-CM

## 2017-06-07 LAB — BASIC METABOLIC PANEL
BUN: 22 mg/dL (ref 6–23)
CALCIUM: 9.5 mg/dL (ref 8.4–10.5)
CO2: 28 meq/L (ref 19–32)
CREATININE: 0.84 mg/dL (ref 0.40–1.20)
Chloride: 103 mEq/L (ref 96–112)
GFR: 69.23 mL/min (ref >60.00)
GLUCOSE: 106 mg/dL — AB (ref 70–99)
Potassium: 4.4 mEq/L (ref 3.5–5.1)
Sodium: 140 mEq/L (ref 135–145)

## 2017-06-07 LAB — URINALYSIS, ROUTINE W REFLEX MICROSCOPIC
BILIRUBIN URINE: NEGATIVE
KETONES UR: NEGATIVE
Nitrite: NEGATIVE
PH: 5.5 (ref 5.0–8.0)
Specific Gravity, Urine: 1.025 (ref 1.000–1.030)
TOTAL PROTEIN, URINE-UPE24: NEGATIVE
URINE GLUCOSE: NEGATIVE
Urobilinogen, UA: 0.2 (ref 0.0–1.0)

## 2017-06-07 LAB — VITAMIN B12: VITAMIN B 12: 669 pg/mL (ref 211–911)

## 2017-06-07 NOTE — Progress Notes (Signed)
ACUTE VISIT   HPI:  Chief Complaint  Patient presents with  . Burning senasation in sternum    off and on for a long time, had an episode 2 weeks ago, radiates to neck sometimes and feels like pressure behind eyes    [name] is a 82 y.o. female, who is here today complaining of episodes of burning chest, retrosternal.  Associated lower lip numbness, "funny feeling" on her head, and retro-ocular pressure.    She has had similar episodes for a while, she has had about 2-3 episodes in the past 2 weeks.  Usually they last a few minutes. She is not sure about exacerbating factors but thinks this might be related with stress. No alleviating factors identified. She lies down for a few minutes and when she wakes up she is feeling better.   Hx of burning tongue sensation. She is on Gabapentin 600 mg at bedtime prn.   She denies chest pain, dyspnea, diaphoresis, dizziness, or syncope. She is concerned about her "veins" in her neck pain causing this problem.   Lab Results  Component Value Date   VITAMINB12 439 07/26/2013   DM II:  Lab Results  Component Value Date   HGBA1C 6.6 (H) 03/08/2017   She has not checked BS's when she has episodes.  She is also requesting a urine culture.  For a while she has been complaining of urinary urgency, occasional episodes of incontinence, and pressure sensation upon voiding. "Bulging" and perineal pressure sensation, intermittently with urination. She denies dysuria, changes in urine frequency, or gross hematuria.   Review of Systems  Constitutional: Positive for fatigue. Negative for activity change, appetite change, fever and unexpected weight change.  HENT: Negative for facial swelling, mouth sores, nosebleeds, sore throat and trouble swallowing.   Eyes: Negative for redness and visual disturbance.  Respiratory: Negative for cough, shortness of breath and wheezing.   Cardiovascular: Negative for chest pain, palpitations  and leg swelling.  Gastrointestinal: Negative for abdominal pain, nausea and vomiting.       Negative for changes in bowel habits.  Endocrine: Negative for cold intolerance, heat intolerance, polydipsia, polyphagia and polyuria.  Genitourinary: Positive for urgency. Negative for decreased urine volume, dysuria, flank pain, hematuria, pelvic pain, vaginal bleeding and vaginal discharge.  Musculoskeletal: Negative for gait problem and myalgias.  Skin: Negative for rash and wound.  Neurological: Positive for numbness and headaches. Negative for syncope and weakness.  Hematological: Negative for adenopathy. Does not bruise/bleed easily.  Psychiatric/Behavioral: Negative for confusion. The patient is nervous/anxious.     Current Outpatient Medications on File Prior to Visit  Medication Sig Dispense Refill  . aspirin 81 MG tablet Take 81 mg by mouth daily.      Marland Kitchen b complex vitamins tablet Take 1 tablet by mouth daily.      . Calcium Carb-Ergocalciferol 250-125 MG-UNIT TABS Take by mouth.    . calcium-vitamin D (OSCAL WITH D) 250-125 MG-UNIT per tablet Take 1 tablet by mouth daily. Reported on 11/11/2015    . gabapentin (NEURONTIN) 300 MG capsule Take 2 capsules (600 mg total) by mouth at bedtime. 60 capsule 3  . Green Tea 315 MG CAPS Take 1 capsule by mouth daily.    Marland Kitchen L-Lysine 1000 MG TABS Take 1 tablet by mouth daily.    . metFORMIN (GLUCOPHAGE) 500 MG tablet TAKE ONE TABLET BY MOUTH WITH BREAKFAST AND TWO WITH SUPPER 270 tablet 2  . Multiple Vitamin (MULTIVITAMIN) tablet Take 1  tablet by mouth daily.      . pantoprazole (PROTONIX) 40 MG tablet TAKE ONE TABLET BY MOUTH ONCE DAILY 90 tablet 3  . tiZANidine (ZANAFLEX) 4 MG tablet TAKE ONE TABLET BY MOUTH AT BEDTIME FOR MUSCLE SPASM 30 tablet 3   No current facility-administered medications on file prior to visit.      Past Medical History:  Diagnosis Date  . Colon polyps   . Diabetes mellitus   . GERD (gastroesophageal reflux disease)   .  Hyperlipidemia    No Known Allergies  Social History   Socioeconomic History  . Marital status: Married    Spouse name: None  . Number of children: None  . Years of education: None  . Highest education level: None  Social Needs  . Financial resource strain: None  . Food insecurity - worry: None  . Food insecurity - inability: None  . Transportation needs - medical: None  . Transportation needs - non-medical: None  Occupational History  . None  Tobacco Use  . Smoking status: Never Smoker  . Smokeless tobacco: Never Used  Substance and Sexual Activity  . Alcohol use: No  . Drug use: No  . Sexual activity: None  Other Topics Concern  . None  Social History Narrative  . None    Vitals:   06/07/17 0817  BP: 122/66  Pulse: 93  Resp: 12  Temp: 98.5 F (36.9 C)  SpO2: 98%   Body mass index is 23.56 kg/m.   Physical Exam  Nursing note and vitals reviewed. Constitutional: She is oriented to person, place, and time. She appears well-developed. No distress.  HENT:  Head: Normocephalic and atraumatic.  Nose: Right sinus exhibits no maxillary sinus tenderness and no frontal sinus tenderness. Left sinus exhibits no maxillary sinus tenderness and no frontal sinus tenderness.  Mouth/Throat: Oropharynx is clear and moist and mucous membranes are normal.  Eyes: Conjunctivae and EOM are normal. Pupils are equal, round, and reactive to light.  Neck: Carotid bruit is present (? R). No tracheal deviation present. No thyroid mass and no thyromegaly present.  Cardiovascular: Normal rate and regular rhythm.  No murmur heard. Pulses:      Dorsalis pedis pulses are 2+ on the right side, and 2+ on the left side.  Respiratory: Effort normal and breath sounds normal. No respiratory distress.  GI: Soft. She exhibits no mass. There is no hepatomegaly. There is no tenderness. There is no CVA tenderness.  Musculoskeletal: She exhibits no edema or tenderness.  Lymphadenopathy:    She has  no cervical adenopathy.  Neurological: She is alert and oriented to person, place, and time. She has normal strength. No cranial nerve deficit. Gait normal.  Skin: Skin is warm. No rash noted. No erythema.  Psychiatric: Her mood appears anxious.  Well groomed, good eye contact.     ASSESSMENT AND PLAN:   Ms. Johnny BridgeMartha was seen today for burning senasation in sternum.  Diagnoses and all orders for this visit:  Chest discomfort  Burning like sensation and not exertional, I think it is related to GERD. Other possible etiologies discussed. For now I am not recommending cardiac work-up. Instructed about warning signs. F/U in 4 weeks.  Right carotid bruit  I am not certain about this causing problem but on examination today ? Of right carotid bruit. Instructed about warning signs. Continue Aspirin 81 mg. She discontinued statin a few months ago, she did not want to take med.  -     VAS  US CAROTID; Future  Numbness  Possible causes dicussed. Neurologic exam negative otherwise today. Instructed about warning signs. Further recommendations will be given according to labs/imaging results.   -     Vitamin B12 -     Basic metabolic panel  Gastroesophageal reflux disease without esophagitis  GERD precautions discussed. For now she does not want to change PPI. F/U in 4 weeks, may change to Dexilant if still having problem.  Urgency of urination  She has Hx of cystocele. She was already treated empirically for UTI and sine then she has requested continues abx treatment.  Explained that urinary urgency, which in her case is chronic,is not pathognomic for UTI. I think is related to her Hx of prolapse. She refused urology evaluation. Insists in having labs,orders placed.  -     Urinalysis, Routine w reflex microscopic -     Culture, Urine       Return in about 4 weeks (around 07/05/2017).     -Ms.Cecilie LowersMartha N Clowdus was advised to seek immediate medical attention if sudden  worsening symptoms.     Trelon Plush G. Swazil[place]Jordan, MD  [facility]. [place] office.

## 2017-06-07 NOTE — Telephone Encounter (Unsigned)
Copied from CRM [phone]. Topic: Quick Communication - See Telephone Encounter >> Jun 07, 2017  4:33 PM Cipriano BunkerLambe, Annette S wrote: CRM for notification. See Telephone encounter for:  Follow up on order for vascular, returning a call but was not sure from who or why.  06/07/17.

## 2017-06-07 NOTE — Telephone Encounter (Signed)
I spoke with referral coordinator and we do have the order for vascular study, they will call her to schedule appointment.

## 2017-06-07 NOTE — Patient Instructions (Signed)
A few things to remember from today's visit:   Right carotid bruit - Plan: VAS US CAROTID  Numbness - Plan: Vitamin B12, Basic metabolic panel  Gastroesophageal reflux disease without esophagitis  Urgency of urination - Plan: Urinalysis, Routine w reflex microscopic, Culture, Urine   About Cystocele  Overview  The pelvic organs, including the bladder, are normally supported by pelvic floor muscles and ligaments.  When these muscles and ligaments are stretched, weakened or torn, the wall between the bladder and the vagina sags or herniates causing a prolapse, sometimes called a cystocele.  This condition may cause discomfort and problems with emptying the bladder.  It can be present in various stages.  Some people are not aware of the changes.  Others may notice changes at the vaginal opening or a feeling of the bladder dropping outside the body.  Causes of a Cystocele  A cystocele is usually caused by muscle straining or stretching during childbirth.  In addition, cystocele is more common after menopause, because the hormone estrogen helps keep the elastic tissues around the pelvic organs strong.  A cystocele is more likely to occur when levels of estrogen decrease.  Other causes include: heavy lifting, chronic coughing, previous pelvic surgery and obesity.  Symptoms  A bladder that has dropped from its normal position may cause: unwanted urine leakage (stress incontinence), frequent urination or urge to urinate, incomplete emptying of the bladder (not feeling bladder relief after emptying), pain or discomfort in the vagina, pelvis, groin, lower back or lower abdomen and frequent urinary tract infections.  Mild cases may not cause any symptoms.  Treatment Options  Pelvic floor (Kegel) exercises:  Strength training the muscles in your genital area  Behavioral changes: Treating and preventing constipation, taking time to empty your bladder properly, learning to lift properly and/or  avoid heavy lifting when possible, stopping smoking, avoiding weight gain and treating a chronic cough or bronchitis.  A pessary: A vaginal support device is sometimes used to help pelvic support caused by muscle and ligament changes.  Surgery: Surgical repair may be necessary if symptoms cannot be managed with exercise, behavioral changes and a pessary.  Surgery is usually considered for severe cases.   2007, [employer]  Please be sure medication list is accurate. If a new problem present, please set up appointment sooner than planned today.

## 2017-06-08 LAB — URINE CULTURE
MICRO NUMBER: 81428206
SPECIMEN QUALITY: ADEQUATE

## 2017-06-16 ENCOUNTER — Other Ambulatory Visit: Payer: Self-pay | Admitting: Family Medicine

## 2017-06-16 DIAGNOSIS — R0989 Other specified symptoms and signs involving the circulatory and respiratory systems: Secondary | ICD-10-CM

## 2017-06-16 DIAGNOSIS — I6523 Occlusion and stenosis of bilateral carotid arteries: Secondary | ICD-10-CM

## 2017-06-16 DIAGNOSIS — I739 Peripheral vascular disease, unspecified: Secondary | ICD-10-CM

## 2017-06-16 MED ORDER — CEPHALEXIN 500 MG PO CAPS: 500.0000 mg | ORAL_CAPSULE | Freq: Two times a day (BID) | ORAL | 0 refills | Status: DC

## 2017-06-16 NOTE — Addendum Note (Signed)
Addended by: Dominic PeaHOLSEY, Blair Mesina V on: 06/16/2017 01:23 PM   Modules accepted: Orders

## 2017-06-20 DIAGNOSIS — N814 Uterovaginal prolapse, unspecified: Secondary | ICD-10-CM

## 2017-06-20 HISTORY — DX: Uterovaginal prolapse, unspecified: N81.4

## 2017-06-21 ENCOUNTER — Ambulatory Visit (HOSPITAL_COMMUNITY)
Admission: RE | Admit: 2017-06-21 | Payer: Medicare Other | Source: Ambulatory Visit | Attending: Family Medicine | Admitting: Family Medicine

## 2017-06-22 ENCOUNTER — Other Ambulatory Visit: Payer: Self-pay | Admitting: Family Medicine

## 2017-06-22 DIAGNOSIS — Z139 Encounter for screening, unspecified: Secondary | ICD-10-CM

## 2017-06-23 ENCOUNTER — Encounter (HOSPITAL_COMMUNITY): Payer: Medicare Other

## 2017-06-23 ENCOUNTER — Telehealth: Payer: Self-pay | Admitting: Emergency Medicine

## 2017-06-23 NOTE — Telephone Encounter (Signed)
Spoke with patient and she states that she has been taking the antibiotic that was sent to her pharmacy but she is still having urine frequency. Patient wants to know if she should make an appointment or can a stronger antibiotic can be sent to patient pharmacy? Patient is expecting a call back today. Please advise thank you.

## 2017-06-23 NOTE — Telephone Encounter (Signed)
We have discussed this issue a few times and abx treatment has been prescribed a couple times even thought I am not convinced symptoms are caused by UTI. These symptoms are chronic,she has not reported dysuria and has hx of cystocele (which could explain frequency and pressure sensation with urination).  At this time I am recommending evaluation by gyn or urology.  Thanks, BJ

## 2017-06-23 NOTE — Telephone Encounter (Signed)
Message sent to Dr. Jordan for review. 

## 2017-06-23 NOTE — Telephone Encounter (Signed)
Spoke with patient, informed patient of dr recommendations for referrals. Patient verbalized understanding and stated that she would have to make a decision and call office back.

## 2017-07-03 ENCOUNTER — Inpatient Hospital Stay (HOSPITAL_COMMUNITY): Admission: RE | Admit: 2017-07-03 | Payer: Medicare Other | Source: Ambulatory Visit

## 2017-07-03 ENCOUNTER — Ambulatory Visit (HOSPITAL_COMMUNITY)
Admission: RE | Admit: 2017-07-03 | Discharge: 2017-07-03 | Disposition: A | Discharge status: Home / Self Care | Payer: Medicare Other | Source: Ambulatory Visit | Attending: Internal Medicine | Admitting: Internal Medicine

## 2017-07-03 DIAGNOSIS — I6523 Occlusion and stenosis of bilateral carotid arteries: Secondary | ICD-10-CM | POA: Diagnosis not present

## 2017-07-03 DIAGNOSIS — R0989 Other specified symptoms and signs involving the circulatory and respiratory systems: Secondary | ICD-10-CM | POA: Diagnosis not present

## 2017-07-03 DIAGNOSIS — I739 Peripheral vascular disease, unspecified: Secondary | ICD-10-CM | POA: Diagnosis present

## 2017-07-11 ENCOUNTER — Encounter: Payer: Self-pay | Admitting: Family Medicine

## 2017-07-11 DIAGNOSIS — I739 Peripheral vascular disease, unspecified: Secondary | ICD-10-CM | POA: Insufficient documentation

## 2017-07-13 ENCOUNTER — Ambulatory Visit: Payer: Medicare Other

## 2017-07-31 ENCOUNTER — Ambulatory Visit
Admission: RE | Admit: 2017-07-31 | Discharge: 2017-07-31 | Disposition: A | Discharge status: Home / Self Care | Payer: Medicare Other | Source: Ambulatory Visit | Attending: Family Medicine | Admitting: Family Medicine

## 2017-07-31 DIAGNOSIS — Z139 Encounter for screening, unspecified: Secondary | ICD-10-CM

## 2017-08-29 ENCOUNTER — Ambulatory Visit: Payer: Medicare Other | Admitting: Family Medicine

## 2017-09-04 NOTE — Progress Notes (Signed)
HPI:   [name] is a 83 y.o. female, who is here today to follow on recent OV.    I saw her on 06/07/2017 when she was complaining of chest discomfort, retrosternal burning sensation.  She was also reporting association with lower lip numbness.  She has lower extremity US and carotid duplex: Minimal stenosis of carotid arteries bilateral but not hemodynamically significant. She is taking Aspirin 81 mg daily. She resumed statin but not taking it daily, she forgets.  She is still having chest pain, mid chest and sometimes with associated neck and mandibular pain.  Chest pain radiated to anterior aspect of neck.  Pain seems to be exacerbated by her normal activities around her house and alleviated by rest. It is intermittent,a few times per day, not sure about duration.  She is not sure if mandibular pain is radiated from chest. No associated palpitation or diaphoresis. She is tells me that she cannot open his mouth big enough to eat hamburgers. No Hx of trauma.   Hyperlipidemia:  Currently she is on Atorvastatin 20 mg, she is not taking it daily.  Following a low fat diet: Not consistently.   Lab Results  Component Value Date   CHOL 285 (H) 03/08/2017   HDL 74.20 03/08/2017   LDLCALC 173 (H) 03/08/2017   LDLDIRECT 112.6 01/17/2012   TRIG 186.0 (H) 03/08/2017   CHOLHDL 4 03/08/2017   She is also requesting refills on Cephalexin because urinary frequency. She states that she has long Hx of fecal incontinence and her bladder "come out" , afraid of fecal contamination causing UTI. So she would like to take abx. She denies dysuria,gross hematuria,or changes in urinary frequency.  No chills or fever.     Review of Systems  Constitutional: Negative for activity change, appetite change, fatigue and fever.  HENT: Negative for mouth sores, nosebleeds and trouble swallowing.   Eyes: Negative for redness and visual disturbance.  Respiratory: Negative for cough,  shortness of breath and wheezing.   Cardiovascular: Positive for chest pain. Negative for palpitations and leg swelling.  Gastrointestinal: Negative for abdominal pain, nausea and vomiting.       Negative for changes in bowel habits.  Genitourinary: Negative for decreased urine volume, dysuria and hematuria.  Musculoskeletal: Positive for arthralgias. Negative for gait problem.  Neurological: Negative for syncope, weakness and headaches.  Psychiatric/Behavioral: Negative for confusion. The patient is nervous/anxious.       Current Outpatient Medications on File Prior to Visit  Medication Sig Dispense Refill  . aspirin 81 MG tablet Take 81 mg by mouth daily.      Marland Kitchen b complex vitamins tablet Take 1 tablet by mouth daily.      . Calcium Carb-Ergocalciferol 250-125 MG-UNIT TABS Take by mouth.    . calcium-vitamin D (OSCAL WITH D) 250-125 MG-UNIT per tablet Take 1 tablet by mouth daily. Reported on 11/11/2015    . gabapentin (NEURONTIN) 300 MG capsule Take 2 capsules (600 mg total) by mouth at bedtime. 60 capsule 3  . metFORMIN (GLUCOPHAGE) 500 MG tablet TAKE ONE TABLET BY MOUTH WITH BREAKFAST AND TWO WITH SUPPER 270 tablet 2  . Multiple Vitamin (MULTIVITAMIN) tablet Take 1 tablet by mouth daily.      . pantoprazole (PROTONIX) 40 MG tablet TAKE ONE TABLET BY MOUTH ONCE DAILY 90 tablet 3  . tiZANidine (ZANAFLEX) 4 MG tablet TAKE ONE TABLET BY MOUTH AT BEDTIME FOR MUSCLE SPASM 30 tablet 3  . Green Tea 315  MG CAPS Take 1 capsule by mouth daily.    Marland Kitchen. L-Lysine 1000 MG TABS Take 1 tablet by mouth daily.     No current facility-administered medications on file prior to visit.      Past Medical History:  Diagnosis Date  . Colon polyps   . Diabetes mellitus   . GERD (gastroesophageal reflux disease)   . Hyperlipidemia    No Known Allergies  Social History   Socioeconomic History  . Marital status: Married    Spouse name: None  . Number of children: None  . Years of education: None  .  Highest education level: None  Social Needs  . Financial resource strain: None  . Food insecurity - worry: None  . Food insecurity - inability: None  . Transportation needs - medical: None  . Transportation needs - non-medical: None  Occupational History  . None  Tobacco Use  . Smoking status: Never Smoker  . Smokeless tobacco: Never Used  Substance and Sexual Activity  . Alcohol use: No  . Drug use: No  . Sexual activity: None  Other Topics Concern  . None  Social History Narrative  . None    Vitals:   09/05/17 1015  BP: 123/80  Pulse: 97  Resp: 12  Temp: 98.6 F (37 C)  SpO2: 100%   Body mass index is 23.78 kg/m.   Physical Exam  Nursing note and vitals reviewed. Constitutional: She is oriented to person, place, and time. She appears well-developed. No distress.  HENT:  Head: Normocephalic and atraumatic.  Mouth/Throat: Oropharynx is clear and moist and mucous membranes are normal.  Mild limitation of mouth opening, no pain or crepitus.  Eyes: Conjunctivae are normal.  Cardiovascular: Normal rate and regular rhythm.  No murmur heard. Pulses:      Dorsalis pedis pulses are 2+ on the right side, and 2+ on the left side.  Respiratory: Effort normal and breath sounds normal. No respiratory distress. She exhibits no tenderness.  GI: Soft. She exhibits no mass. There is no hepatomegaly. There is no tenderness.  Genitourinary:  Genitourinary Comments: Deferred to gyn.  Musculoskeletal: She exhibits no edema.  Lymphadenopathy:    She has no cervical adenopathy.  Neurological: She is alert and oriented to person, place, and time. She has normal strength. Coordination normal.  Skin: Skin is warm. No erythema.  Psychiatric: Her mood appears anxious.  Well groomed, good eye contact.    ASSESSMENT AND PLAN:  Ms. Melissa [name] was seen today for follow-up.  . Orders Placed This Encounter  Procedures  . Lipid panel  . Ambulatory referral to Cardiology  . Ambulatory  referral to Gynecology   Lab Results  Component Value Date   CHOL 222 (H) 09/05/2017   HDL 72.20 09/05/2017   LDLCALC 116 (H) 09/05/2017   LDLDIRECT 112.6 01/17/2012   TRIG 166.0 (H) 09/05/2017   CHOLHDL 3 09/05/2017     Chest pain, unspecified type  Possible etiologies discussed. Hx of GERD, which could also aggravate problem. Cardiology referral placed,she already has an appt. Instructed about warning signs.  -     Ambulatory referral to Cardiology  Peripheral arterial disease (HCC)  Continue Aspirin 81 mg and Atorvastatin.  Hyperlipidemia associated with type 2 diabetes mellitus (HCC)  No changes in current management, will follow labs done today and will give further recommendations accordingly. Educated about importance of compliance with taking medication.  -     Lipid panel  Cystocele with prolapse  I do not think  abx is needed at this time. She will continue following with gyn.  -     Ambulatory referral to Gynecology  Arthralgia of left temporomandibular joint  ? TMJ synd vs OA. She has an appt with her dentins in a few days.  Handout with recommendations given.      Betty G. [place], MD  [facility]. [place] office.

## 2017-09-05 ENCOUNTER — Ambulatory Visit: Payer: Medicare Other | Admitting: Family Medicine

## 2017-09-05 ENCOUNTER — Encounter: Payer: Self-pay | Admitting: Family Medicine

## 2017-09-05 VITALS — BP 123/80 | HR 97 | Temp 98.6°F | Resp 12 | Ht 63.0 in | Wt 134.2 lb

## 2017-09-05 DIAGNOSIS — N814 Uterovaginal prolapse, unspecified: Secondary | ICD-10-CM

## 2017-09-05 DIAGNOSIS — R079 Chest pain, unspecified: Secondary | ICD-10-CM

## 2017-09-05 DIAGNOSIS — E1169 Type 2 diabetes mellitus with other specified complication: Secondary | ICD-10-CM

## 2017-09-05 DIAGNOSIS — M26622 Arthralgia of left temporomandibular joint: Secondary | ICD-10-CM

## 2017-09-05 DIAGNOSIS — I739 Peripheral vascular disease, unspecified: Secondary | ICD-10-CM | POA: Diagnosis not present

## 2017-09-05 DIAGNOSIS — E785 Hyperlipidemia, unspecified: Secondary | ICD-10-CM | POA: Diagnosis not present

## 2017-09-05 LAB — LIPID PANEL
CHOLESTEROL: 222 mg/dL — AB (ref 0–200)
HDL: 72.2 mg/dL (ref >39.00)
LDL Cholesterol: 116 mg/dL — ABNORMAL HIGH (ref 0–99)
NonHDL: 149.41
TRIGLYCERIDES: 166 mg/dL — AB (ref 0.0–149.0)
Total CHOL/HDL Ratio: 3
VLDL: 33.2 mg/dL (ref 0.0–40.0)

## 2017-09-05 NOTE — Patient Instructions (Addendum)
A few things to remember from today's visit:   Chest pain, unspecified type - Plan: Ambulatory referral to Cardiology  Peripheral arterial disease (HCC)  Hyperlipidemia associated with type 2 diabetes mellitus (HCC) - Plan: Lipid panel  Cystocele with prolapse - Plan: Ambulatory referral to Gynecology  Arthralgia of left temporomandibular joint  Temporomandibular Joint Syndrome Temporomandibular joint (TMJ) syndrome is a condition that affects the joints between your jaw and your skull. The TMJs are located near your ears and allow your jaw to open and close. These joints and the nearby muscles are involved in all movements of the jaw. People with TMJ syndrome have pain in the area of these joints and muscles. Chewing, biting, or other movements of the jaw can be difficult or painful. TMJ syndrome can be caused by various things. In many cases, the condition is mild and goes away within a few weeks. For some people, the condition can become a long-term problem. What are the causes? Possible causes of TMJ syndrome include:  Grinding your teeth or clenching your jaw. Some people do this when they are under stress.  Arthritis.  Injury to the jaw.  Head or neck injury.  Teeth or dentures that are not aligned well.  In some cases, the cause of TMJ syndrome may not be known. What are the signs or symptoms? The most common symptom is an aching pain on the side of the head in the area of the TMJ. Other symptoms may include:  Pain when moving your jaw, such as when chewing or biting.  Being unable to open your jaw all the way.  Making a clicking sound when you open your mouth.  Headache.  Earache.  Neck or shoulder pain.  How is this diagnosed? Diagnosis can usually be made based on your symptoms, your medical history, and a physical exam. Your health care provider may check the range of motion of your jaw. Imaging tests, such as X-rays or an MRI, are sometimes done. You may need  to see your dentist to determine if your teeth and jaw are lined up correctly. How is this treated? TMJ syndrome often goes away on its own. If treatment is needed, the options may include:  Eating soft foods and applying ice or heat.  Medicines to relieve pain or inflammation.  Medicines to relax the muscles.  A splint, bite plate, or mouthpiece to prevent teeth grinding or jaw clenching.  Relaxation techniques or counseling to help reduce stress.  Transcutaneous electrical nerve stimulation (TENS). This helps to relieve pain by applying an electrical current through the skin.  Acupuncture. This is sometimes helpful to relieve pain.  Jaw surgery. This is rarely needed.  Follow these instructions at home:  Take medicines only as directed by your health care provider.  Eat a soft diet if you are having trouble chewing.  Apply ice to the painful area. ? Put ice in a plastic bag. ? Place a towel between your skin and the bag. ? Leave the ice on for 20 minutes, 2-3 times a day.  Apply a warm compress to the painful area as directed.  Massage your jaw area and perform any jaw stretching exercises as recommended by your health care provider.  If you were given a mouthpiece or bite plate, wear it as directed.  Avoid foods that require a lot of chewing. Do not chew gum.  Keep all follow-up visits as directed by your health care provider. This is important. Contact a health care provider if:  You are having trouble eating.  You have new or worsening symptoms. Get help right away if:  Your jaw locks open or closed. This information is not intended to replace advice given to you by your health care provider. Make sure you discuss any questions you have with your health care provider. Document Released: 03/01/2001 Document Revised: 02/04/2016 Document Reviewed: 01/09/2014 Elsevier Interactive Patient Education  [employer]2018 [employer]  Please be sure medication list is  accurate. If a new problem present, please set up appointment sooner than planned today.

## 2017-09-09 ENCOUNTER — Other Ambulatory Visit: Payer: Self-pay | Admitting: Family Medicine

## 2017-09-09 DIAGNOSIS — E7800 Pure hypercholesterolemia, unspecified: Secondary | ICD-10-CM

## 2017-09-11 ENCOUNTER — Ambulatory Visit: Payer: Medicare Other | Admitting: Obstetrics & Gynecology

## 2017-09-11 ENCOUNTER — Encounter: Payer: Self-pay | Admitting: Obstetrics & Gynecology

## 2017-09-11 VITALS — BP 120/72 | HR 93 | Ht 63.0 in | Wt 130.5 lb

## 2017-09-11 DIAGNOSIS — N8111 Cystocele, midline: Secondary | ICD-10-CM | POA: Diagnosis not present

## 2017-09-11 DIAGNOSIS — N993 Prolapse of vaginal vault after hysterectomy: Secondary | ICD-10-CM | POA: Diagnosis not present

## 2017-09-11 NOTE — Progress Notes (Signed)
Patient ID: Melissa [name], female   DOB: 06/02/1935, 83 y.o.   MRN: [id number]  No chief complaint on file.   HPI Melissa [name] is a 83 y.o. female. Widowed P3 here with a long history of vaginal vault/bladder dropping. This has been present for about 5 or more years. She has not had any treatment for it. She has only very rare urinary incontinence. She has been widowed for about a month and abstinent for 40 years. HPI  Past Medical History:  Diagnosis Date  . Colon polyps   . Diabetes mellitus   . GERD (gastroesophageal reflux disease)   . Hyperlipidemia     Past Surgical History:  Procedure Laterality Date  . ABDOMINAL HYSTERECTOMY    . FOOT SURGERY    . RETINAL DETACHMENT SURGERY    . TONSILLECTOMY      Family History  Problem Relation Age of Onset  . Heart disease Unknown   . Breast cancer Unknown     Social History Social History   Tobacco Use  . Smoking status: Never Smoker  . Smokeless tobacco: Never Used  Substance Use Topics  . Alcohol use: No  . Drug use: No    No Known Allergies  Current Outpatient Medications  Medication Sig Dispense Refill  . aspirin 81 MG tablet Take 81 mg by mouth daily.      Marland Kitchen. atorvastatin (LIPITOR) 20 MG tablet TAKE ONE TABLET BY MOUTH ONCE DAILY 90 tablet 2  . b complex vitamins tablet Take 1 tablet by mouth daily.      . Calcium Carb-Ergocalciferol 250-125 MG-UNIT TABS Take by mouth.    . calcium-vitamin D (OSCAL WITH D) 250-125 MG-UNIT per tablet Take 1 tablet by mouth daily. Reported on 11/11/2015    . gabapentin (NEURONTIN) 300 MG capsule Take 2 capsules (600 mg total) by mouth at bedtime. 60 capsule 3  . L-Lysine 1000 MG TABS Take 1 tablet by mouth daily.    . metFORMIN (GLUCOPHAGE) 500 MG tablet TAKE ONE TABLET BY MOUTH WITH BREAKFAST AND TWO WITH SUPPER 270 tablet 2  . Multiple Vitamin (MULTIVITAMIN) tablet Take 1 tablet by mouth daily.      . pantoprazole (PROTONIX) 40 MG tablet TAKE ONE TABLET BY MOUTH ONCE DAILY 90  tablet 3  . tiZANidine (ZANAFLEX) 4 MG tablet TAKE ONE TABLET BY MOUTH AT BEDTIME FOR MUSCLE SPASM 30 tablet 3  . Green Tea 315 MG CAPS Take 1 capsule by mouth daily.     No current facility-administered medications for this visit.     Review of Systems Review of Systems  She had a hysterectomy in the distant past for unknown reason.  Height 5\' 3"  (1.6 m), weight 130 lb 8 oz (59.2 kg).  Physical Exam Physical Exam Breathing, conversing, and ambulating normally Well nourished, well hydrated White female, no apparent distress 3rd degree cystocele and vault prolapse Severe atrophy  No masses with bimanual exam Data Reviewed  RECOMMENDATION: Screening mammogram in one year. (Code:[id number]-B-01Y)  BI-RADS CATEGORY  1: Negative.   Electronically Signed   By: Harmon PierJeffrey  Hu M.D.   On: 07/31/2017 16:59   Assessment    Cystocele and vaginal vault prolapse    Plan    I discussed the issue. Offered watchful waiting, pessary fitting, or referral for possible surgery. At this time, she prefers watchful waiting       Melissa BossierMyra C Arayna [name] 09/11/2017, 10:56 AM

## 2017-09-20 ENCOUNTER — Encounter: Payer: Self-pay | Admitting: Cardiovascular Disease

## 2017-09-20 ENCOUNTER — Ambulatory Visit: Payer: Medicare Other | Admitting: Cardiovascular Disease

## 2017-09-20 VITALS — BP 132/78 | HR 96 | Ht 63.0 in | Wt 133.8 lb

## 2017-09-20 DIAGNOSIS — R002 Palpitations: Secondary | ICD-10-CM | POA: Diagnosis not present

## 2017-09-20 DIAGNOSIS — Z79899 Other long term (current) drug therapy: Secondary | ICD-10-CM | POA: Diagnosis not present

## 2017-09-20 DIAGNOSIS — R0789 Other chest pain: Secondary | ICD-10-CM

## 2017-09-20 DIAGNOSIS — E7800 Pure hypercholesterolemia, unspecified: Secondary | ICD-10-CM

## 2017-09-20 MED ORDER — METOPROLOL SUCCINATE ER 25 MG PO TB24: 25.0000 mg | ORAL_TABLET | Freq: Every day | ORAL | 3 refills | Status: DC

## 2017-09-20 MED ORDER — ATORVASTATIN CALCIUM 40 MG PO TABS: 40.0000 mg | ORAL_TABLET | Freq: Every day | ORAL | 3 refills | Status: DC

## 2017-09-20 NOTE — Progress Notes (Signed)
Cardiology Office Note    Date:  09/20/2017   ID:  Melissa [name], DOB [birth year], MRN [id number]  PCP:  Melissa [name], Melissa G, MD  Cardiologist:  Shelva Majestic, MD    New cardiology evaluation referred through the courtesy of Dr. Betty Melissa [name] for evaluation of chest pain and palpitations.  History of Present Illness:  Melissa [name] is a 83 y.o. female who admits to 3-4 month history of a "stinging "like chest pain.  This typically is nonexertional, but seems to occur whenever she feels that she has a lot to do and becomes anxious and overloaded.  She was recently seen by Dr. Betty Melissa [name] on 09/05/2017.  That time she complained  of retrosternal burning. .  She also has noticed at times her pulse is high.  She has a history of hyperlipidemia but has not been regularly taking her statin. She also has a history of possible GERD.  She also has noticed some possible TMJ discomfort.  Because of her recent symptomatology.  She is referred for cardiac catheterization.  She denies PND, orthopnea.  She denies presyncope or syncope.  Past Medical History:  Diagnosis Date  . Colon polyps   . Diabetes mellitus   . GERD (gastroesophageal reflux disease)   . Hyperlipidemia     Past Surgical History:  Procedure Laterality Date  . ABDOMINAL HYSTERECTOMY    . FOOT SURGERY    . RETINAL DETACHMENT SURGERY    . TONSILLECTOMY      Current Medications: Outpatient Medications Prior to Visit  Medication Sig Dispense Refill  . aspirin 81 MG tablet Take 81 mg by mouth daily.      Marland Kitchen b complex vitamins tablet Take 1 tablet by mouth daily.      . Calcium Carb-Ergocalciferol 250-125 MG-UNIT TABS Take by mouth.    . calcium-vitamin D (OSCAL WITH D) 250-125 MG-UNIT per tablet Take 1 tablet by mouth daily. Reported on 11/11/2015    . gabapentin (NEURONTIN) 300 MG capsule Take 2 capsules (600 mg total) by mouth at bedtime. (Patient taking differently: Take 300 mg by mouth at bedtime. TAKE 2 CAPSULES BY MOUTH AT  BEDTIME) 60 capsule 3  . metFORMIN (GLUCOPHAGE) 500 MG tablet TAKE ONE TABLET BY MOUTH WITH BREAKFAST AND TWO WITH SUPPER (Patient taking differently: 500 mg. TAKE ONE TABLET BY MOUTH WITH BREAKFAST AND TWO WITH SUPPER) 270 tablet 2  . Multiple Vitamin (MULTIVITAMIN) tablet Take 1 tablet by mouth daily.      . pantoprazole (PROTONIX) 40 MG tablet TAKE ONE TABLET BY MOUTH ONCE DAILY 90 tablet 3  . tiZANidine (ZANAFLEX) 4 MG tablet TAKE ONE TABLET BY MOUTH AT BEDTIME FOR MUSCLE SPASM (Patient taking differently: AS NEEDED) 30 tablet 3  . atorvastatin (LIPITOR) 20 MG tablet TAKE ONE TABLET BY MOUTH ONCE DAILY (Patient taking differently: TAKE 1 TABLETS BY MOUTH ONCE DAILY.) 90 tablet 2  . Green Tea 315 MG CAPS Take 1 capsule by mouth daily.    Marland Kitchen L-Lysine 1000 MG TABS Take 1 tablet by mouth daily.     No facility-administered medications prior to visit.      Allergies:   Patient has no known allergies.   Social History   Socioeconomic History  . Marital status: Married    Spouse name: Not on file  . Number of children: Not on file  . Years of education: Not on file  . Highest education level: Not on file  Occupational History  . Not on file  Social Needs  . Financial resource strain: Not on file  . Food insecurity:    Worry: Not on file    Inability: Not on file  . Transportation needs:    Medical: Not on file    Non-medical: Not on file  Tobacco Use  . Smoking status: Never Smoker  . Smokeless tobacco: Never Used  Substance and Sexual Activity  . Alcohol use: No  . Drug use: No  . Sexual activity: Not on file  Lifestyle  . Physical activity:    Days per week: Not on file    Minutes per session: Not on file  . Stress: Not on file  Relationships  . Social connections:    Talks on phone: Not on file    Gets together: Not on file    Attends religious service: Not on file    Active member of club or organization: Not on file    Attends meetings of clubs or organizations: Not  on file    Relationship status: Not on file  Other Topics Concern  . Not on file  Social History Narrative  . Not on file    Socially she is widowed since February 2019.  She previously worked in [employer] in the [place].  She completed 12th grade of education.  There is no tobacco or alcohol use.  She does not exercise.  Family History:  The patient's family history includes Breast cancer in her unknown relative; Heart disease in her unknown relative.  Her mother died at 69 with cancer.  Her father died at age 5 with heart problems.  She has a living brother age 59.  2 sisters are deceased.  ROS General: Negative; No fevers, chills, or night sweats;  HEENT: blind in her right eye.  TMJ discomfort. no sinus congestion, difficulty swallowing Pulmonary: Negative; No cough, wheezing, shortness of breath, hemoptysis Cardiovascular: Negative; No chest pain, presyncope, syncope, palpitations GI: positive for GERD. GU: Negative; No dysuria, hematuria, or difficulty voiding Musculoskeletal: Negative; no myalgias, joint pain, or weakness Hematologic/Oncology: Negative; no easy bruising, bleeding Endocrine: Negative; no heat/cold intolerance; no diabetes Neuro: Negative; no changes in balance, headaches Skin: Negative; No rashes or skin lesions Psychiatric: Negative; No behavioral problems, depression Sleep: Negative; No snoring, daytime sleepiness, hypersomnolence, bruxism, restless legs, hypnogognic hallucinations, no cataplexy Other comprehensive 14 point system review is negative.   PHYSICAL EXAM:   VS:  BP 132/78 (BP Location: Left Arm, Patient Position: Sitting, Cuff Size: Normal)   Pulse 96   Ht _0  (1.6 m)   Wt 133 lb 12.8 oz (60.7 kg)   BMI 23.70 kg/m     Repeat blood pressure by me was 126/76.  Wt Readings from Last 3 Encounters:  09/20/17 133 lb 12.8 oz (60.7 kg)  09/11/17 130 lb 8 oz (59.2 kg)  09/05/17 134 lb 4 oz (60.9 kg)    General:  Alert, oriented, no distress.  Skin: normal turgor, no rashes, warm and dry HEENT: Normocephalic, atraumatic.Extraocular muscles intact.  Blind in her right eye.  Right pupil dilated.  Nose without nasal septal hypertrophy Mouth/Parynx benign; Mallinpatti scale 3 Neck: No JVD, no carotid bruits; normal carotid upstroke Lungs: clear to ausculatation and percussion; no wheezing or rales Chest wall: without tenderness to palpitation Heart: PMI not displaced, RRR, s1 s2 normal, 1/6 systolic murmur, no diastolic murmur, no rubs, gallops, thrills, or heaves Abdomen: soft, nontender; no hepatosplenomehaly, BS+; abdominal aorta nontender and not dilated by palpation. Back:  no CVA tenderness Pulses 2+ Musculoskeletal: full range of motion, normal strength, no joint deformities Extremities: Oz of her lower extremity varicose veins. no clubbing cyanosis. Homan's sign negative  Neurologic: grossly nonfocal; Cranial nerves grossly wnl Psychologic: Normal mood and affect   Studies/Labs Reviewed:   EKG:  EKG is ordered today.  ECG (independently read by me): Normal Sinus rhythm at 96 bpm.  Possible left atrial enlargement.  Normal intervals.  Recent Labs: BMP Latest Ref Rng & Units 06/07/2017 03/08/2017 08/31/2016  Glucose 70 - 99 mg/dL 106(H) 105(H) 119(H)  BUN 6 - 23 mg/dL _0 Creatinine 0.40 - 1.20 mg/dL 0.84 0.86 0.87  Sodium 135 - 145 mEq/L 140 138 139  Potassium 3.5 - 5.1 mEq/L 4.4 4.4 4.6  Chloride 96 - 112 mEq/L 103 103 103  CO2 19 - 32 mEq/L _1 Calcium 8.4 - 10.5 mg/dL 9.5 9.6 9.8     Hepatic Function Latest Ref Rng & Units 02/16/2016 10/17/2012 01/17/2012  Total Protein 6.0 - 8.3 [name]/dL 6.7 7.2 7.5  Albumin 3.5 - 5.2 [name]/dL 4.1 4.2 4.4  AST 0 - 37 U/L _2 ALT 0 - 35 U/L _3 Alk Phosphatase 39 - 117 U/L 62 57 58  Total Bilirubin 0.2 - 1.2 mg/dL 0.3 0.7 0.6  Bilirubin, Direct 0.0 - 0.3 mg/dL - 0.1 0.0    CBC Latest Ref Rng & Units 09/02/2009 03/19/2007  WBC 4.5 -  10.5 10*3/microliter 6.2 6.4  Hemoglobin 12.0 - 15.0 [name]/dL 13.2 13.4  Hematocrit 36.0 - 46.0 % 40.4 39.8  Platelets 150.0 - 400.0 K/uL 314.0 337   Lab Results  Component Value Date   MCV 95.5 09/02/2009   MCV 91.4 03/19/2007   Lab Results  Component Value Date   TSH 1.54 08/31/2016   Lab Results  Component Value Date   HGBA1C 6.6 (H) 03/08/2017     BNP No results found for: BNP  ProBNP No results found for: PROBNP   Lipid Panel     Component Value Date/Time   CHOL 222 (H) 09/05/2017 1104   TRIG 166.0 (H) 09/05/2017 1104   HDL 72.20 09/05/2017 1104   CHOLHDL 3 09/05/2017 1104   VLDL 33.2 09/05/2017 1104   LDLCALC 116 (H) 09/05/2017 1104   LDLDIRECT 112.6 01/17/2012 1051     RADIOLOGY: No results found.   Additional studies/ records that were reviewed today include:  I reviewed the records from Dr. Betty Melissa [name].    ASSESSMENT:    1. Atypical chest pain   2. Pure hypercholesterolemia   3. Medication management     PLAN:  Melissa [name] is an 83 year old female who is recently widowed and admits to a 3 to four-month history of a stinging nonexertional type chest pain.  The pain typically lasts for seconds.  It is typically nonexertional, but more often occurs when she is anxious, feels overworked and has lots to do.  She also has noticed the sensation of increased heartbeat.  Of hyperlipidemia.  Recent laboratory has shown marked elevation of cholesterol at 285 6 months ago and on repeat 2 weeks ago was 222.  LDL cholesterol was still elevated at 116, improved from 173.  I have recommended further titration of her atorvastatin to 40 mg daily. I am recommending that she undergo an echo Doppler study  for evaluation of systolic and diastolic function.  Her resting pulse is in the upper 90s.  With her somewhat atypical chest  pain and her increased heart rate I am initiating Toprol-XL 25 mg.  I She has .  Varicose veins.  Her chest pain is very atypical and does not  sound ischemic in etiology.  I will see her for follow-up evaluation in 2-3 months and prior to that office visit.  Repeat laboratory consisting of chemistry and lipid studies will be undertaken.   Medication Adjustments/Labs and Tests Ordered: Current medicines are reviewed at length with the patient today.  Concerns regarding medicines are outlined above.  Medication changes, Labs and Tests ordered today are listed in the Patient Instructions below. Patient Instructions  Medication Instructions:  INCREASE atorvastatin (Lipitor) to 40 mg daily  START metoprolol succinate (Toprol XL) 25 mg daily  Labwork: Please return for FASTING labs in 2 months (CMET,Lipid)  Our in office lab hours are Monday-Friday 8:00-4:00, closed for lunch 12:45-1:45 pm.  No appointment needed.  Testing/Procedures: Your physician has requested that you have an echocardiogram. Echocardiography is a painless test that uses sound waves to create images of your heart. It provides your doctor with information about the size and shape of your heart and how well your heart's chambers and valves are working. This procedure takes approximately one hour. There are no restrictions for this procedure.  This will be done at our [name] location:  [place]: 3 months with Dr. Claiborne Billings.  Any Other Special Instructions Will Be Listed Below (If Applicable).     If you need a refill on your cardiac medications before your next appointment, please call your pharmacy.      Signed, Shelva Majestic, MD  09/20/2017 12:49 PM    [facility] [address], [place], [place], [place]  [address] Phone: [phone]

## 2017-09-20 NOTE — Patient Instructions (Signed)
Medication Instructions:  INCREASE atorvastatin (Lipitor) to 40 mg daily  START metoprolol succinate (Toprol XL) 25 mg daily  Labwork: Please return for FASTING labs in 2 months (CMET,Lipid)  Our in office lab hours are Monday-Friday 8:00-4:00, closed for lunch 12:45-1:45 pm.  No appointment needed.  Testing/Procedures: Your physician has requested that you have an echocardiogram. Echocardiography is a painless test that uses sound waves to create images of your heart. It provides your doctor with information about the size and shape of your heart and how well your heart's chambers and valves are working. This procedure takes approximately one hour. There are no restrictions for this procedure.  [facility]:  [facility] [facility]   Follow-Up: 3 months with Dr. Tresa EndoKelly.  Any Other Special Instructions Will Be Listed Below (If Applicable).     If you need a refill on your cardiac medications before your next appointment, please call your pharmacy.

## 2017-09-26 ENCOUNTER — Encounter: Payer: Self-pay | Admitting: Cardiovascular Disease

## 2017-10-03 ENCOUNTER — Other Ambulatory Visit (HOSPITAL_COMMUNITY): Payer: Medicare Other

## 2017-10-03 ENCOUNTER — Ambulatory Visit (HOSPITAL_COMMUNITY): Payer: Medicare Other | Attending: Cardiovascular Disease

## 2017-10-03 ENCOUNTER — Other Ambulatory Visit: Payer: Self-pay

## 2017-10-03 DIAGNOSIS — E119 Type 2 diabetes mellitus without complications: Secondary | ICD-10-CM | POA: Insufficient documentation

## 2017-10-03 DIAGNOSIS — E785 Hyperlipidemia, unspecified: Secondary | ICD-10-CM | POA: Insufficient documentation

## 2017-10-03 DIAGNOSIS — R0789 Other chest pain: Secondary | ICD-10-CM | POA: Diagnosis not present

## 2017-10-21 ENCOUNTER — Other Ambulatory Visit: Payer: Self-pay | Admitting: Family Medicine

## 2017-10-21 DIAGNOSIS — K146 Glossodynia: Secondary | ICD-10-CM

## 2017-12-08 LAB — COMPREHENSIVE METABOLIC PANEL
A/G RATIO: 2.1 (ref 1.2–2.2)
ALT: 10 IU/L (ref 0–32)
AST: 13 IU/L (ref 0–40)
Albumin: 4.5 g/dL (ref 3.5–4.7)
Alkaline Phosphatase: 78 IU/L (ref 39–117)
BUN/Creatinine Ratio: 17 (ref 12–28)
BUN: 15 mg/dL (ref 8–27)
Bilirubin Total: 0.4 mg/dL (ref 0.0–1.2)
CALCIUM: 9.5 mg/dL (ref 8.7–10.3)
CO2: 23 mmol/L (ref 20–29)
Chloride: 102 mmol/L (ref 96–106)
Creatinine, Ser: 0.86 mg/dL (ref 0.57–1.00)
GFR, EST AFRICAN AMERICAN: 73 mL/min/{1.73_m2} (ref >59)
GFR, EST NON AFRICAN AMERICAN: 64 mL/min/{1.73_m2} (ref >59)
GLOBULIN, TOTAL: 2.1 g/dL (ref 1.5–4.5)
Glucose: 106 mg/dL — ABNORMAL HIGH (ref 65–99)
Potassium: 4.4 mmol/L (ref 3.5–5.2)
SODIUM: 140 mmol/L (ref 134–144)
TOTAL PROTEIN: 6.6 g/dL (ref 6.0–8.5)

## 2017-12-08 LAB — LIPID PANEL
CHOL/HDL RATIO: 2.6 ratio (ref 0.0–4.4)
Cholesterol, Total: 176 mg/dL (ref 100–199)
HDL: 67 mg/dL (ref >39)
LDL Calculated: 87 mg/dL (ref 0–99)
Triglycerides: 108 mg/dL (ref 0–149)
VLDL Cholesterol Cal: 22 mg/dL (ref 5–40)

## 2018-01-02 ENCOUNTER — Ambulatory Visit: Payer: Medicare Other | Admitting: Family Medicine

## 2018-01-03 ENCOUNTER — Ambulatory Visit: Payer: Medicare Other | Admitting: Family Medicine

## 2018-01-03 ENCOUNTER — Encounter: Payer: Self-pay | Admitting: Family Medicine

## 2018-01-03 ENCOUNTER — Ambulatory Visit: Payer: Medicare Other | Admitting: Cardiovascular Disease

## 2018-01-03 ENCOUNTER — Encounter: Payer: Self-pay | Admitting: Cardiovascular Disease

## 2018-01-03 VITALS — BP 115/75 | HR 96 | Ht 63.0 in | Wt 134.0 lb

## 2018-01-03 VITALS — BP 120/80 | HR 96 | Temp 98.4°F | Resp 12 | Ht 63.0 in | Wt 136.2 lb

## 2018-01-03 DIAGNOSIS — K219 Gastro-esophageal reflux disease without esophagitis: Secondary | ICD-10-CM | POA: Diagnosis not present

## 2018-01-03 DIAGNOSIS — R002 Palpitations: Secondary | ICD-10-CM

## 2018-01-03 DIAGNOSIS — I3481 Nonrheumatic mitral (valve) annulus calcification: Secondary | ICD-10-CM

## 2018-01-03 DIAGNOSIS — I059 Rheumatic mitral valve disease, unspecified: Secondary | ICD-10-CM | POA: Diagnosis not present

## 2018-01-03 DIAGNOSIS — E785 Hyperlipidemia, unspecified: Secondary | ICD-10-CM

## 2018-01-03 DIAGNOSIS — I739 Peripheral vascular disease, unspecified: Secondary | ICD-10-CM

## 2018-01-03 DIAGNOSIS — Z794 Long term (current) use of insulin: Secondary | ICD-10-CM

## 2018-01-03 DIAGNOSIS — E11638 Type 2 diabetes mellitus with other oral complications: Secondary | ICD-10-CM | POA: Diagnosis not present

## 2018-01-03 DIAGNOSIS — R0789 Other chest pain: Secondary | ICD-10-CM | POA: Diagnosis not present

## 2018-01-03 DIAGNOSIS — Z0000 Encounter for general adult medical examination without abnormal findings: Secondary | ICD-10-CM

## 2018-01-03 DIAGNOSIS — E118 Type 2 diabetes mellitus with unspecified complications: Secondary | ICD-10-CM | POA: Diagnosis not present

## 2018-01-03 DIAGNOSIS — Z23 Encounter for immunization: Secondary | ICD-10-CM | POA: Diagnosis not present

## 2018-01-03 DIAGNOSIS — E1169 Type 2 diabetes mellitus with other specified complication: Secondary | ICD-10-CM | POA: Diagnosis not present

## 2018-01-03 LAB — BASIC METABOLIC PANEL
BUN: 20 mg/dL (ref 6–23)
CHLORIDE: 106 meq/L (ref 96–112)
CO2: 27 meq/L (ref 19–32)
CREATININE: 1.14 mg/dL (ref 0.40–1.20)
Calcium: 9.5 mg/dL (ref 8.4–10.5)
GFR: 48.6 mL/min — ABNORMAL LOW (ref >60.00)
Glucose, Bld: 158 mg/dL — ABNORMAL HIGH (ref 70–99)
Potassium: 4.3 mEq/L (ref 3.5–5.1)
SODIUM: 141 meq/L (ref 135–145)

## 2018-01-03 LAB — MICROALBUMIN / CREATININE URINE RATIO
Creatinine,U: 153.5 mg/dL
MICROALB/CREAT RATIO: 1 mg/g (ref 0.0–30.0)
Microalb, Ur: 1.6 mg/dL (ref 0.0–1.9)

## 2018-01-03 LAB — HEMOGLOBIN A1C: Hgb A1c MFr Bld: 6.9 % — ABNORMAL HIGH (ref 4.6–6.5)

## 2018-01-03 MED ORDER — METFORMIN HCL 500 MG PO TABS: ORAL_TABLET | ORAL | 2 refills | Status: DC

## 2018-01-03 MED ORDER — METOPROLOL SUCCINATE ER 25 MG PO TB24: 12.5000 mg | ORAL_TABLET | Freq: Two times a day (BID) | ORAL | 3 refills | Status: DC

## 2018-01-03 NOTE — Progress Notes (Signed)
HPI:   Melissa [name] is a 83 y.o. female, who is here today for 5 months follow up.   She was last seen on 09/05/17.  Since her last OV she has seen cardiologist for chest pain and with gyn for vaginal vault prolapse.  Diabetes Mellitus II:  Currently on Metformin 500 mg Am and 1000 mg at night.  Checking BS's : Not checking. Hypoglycemia:Denies. Last eye exam current, Dx with macular degeneration. She is tolerating medications well. She denies abdominal pain, nausea, vomiting, polydipsia, polyuria, or polyphagia. No feet numbness, tingling, or burning.   Lab Results  Component Value Date   CREATININE 0.86 12/08/2017   BUN 15 12/08/2017   NA 140 12/08/2017   K 4.4 12/08/2017   CL 102 12/08/2017   CO2 23 12/08/2017    Lab Results  Component Value Date   HGBA1C 6.6 (H) 03/08/2017   Lab Results  Component Value Date   MICROALBUR 2.5 (H) 03/08/2017    Hyperlipidemia:  Currently on Lipitor 40 mg daily. Following a low fat diet: Yes.  She has not noted side effects with medication. + PAD, she is on Aspirin 81 mg.   Lab Results  Component Value Date   CHOL 176 12/08/2017   HDL 67 12/08/2017   LDLCALC 87 12/08/2017   LDLDIRECT 112.6 01/17/2012   TRIG 108 12/08/2017   CHOLHDL 2.6 12/08/2017   She is also to for her annual Medicare visit.  She lives alone. Her husband died a few days. Independent ADL's and IADL's. She had a fall about a month ago, she was watching washing the windows, she tripped on a small stool.She landed on right arm.  She had some pain in right wrist, she took some ibuprofen and local ice, pain has resolved.  She does not exercise regularly, tries to follow a healthy diet.  She wear eye glasses.  Functional Status Survey: Is the patient deaf or have difficulty hearing?: No Does the patient have difficulty seeing, even when wearing glasses/contacts?: No Does the patient have difficulty concentrating, remembering, or making  decisions?: No Does the patient have difficulty walking or climbing stairs?: No Does the patient have difficulty dressing or bathing?: No Does the patient have difficulty doing errands alone such as visiting a doctor's office or shopping?: No  Fall Risk  01/03/2018 08/31/2016 07/26/2013 05/14/2013 04/10/2013  Falls in the past year? Yes No No No No  Number falls in past yr: 1 - - - -  Injury with Fall? No - - - -  Risk for fall due to : Impaired balance/gait - - - -  Follow up Education provided;Falls prevention discussed - - - -     Providers she sees regularly:  Eye care provider: Dr Luciana Axe Cardiologist: Dr Tresa Endo.   Depression screen [facility] 2/9 01/03/2018  Decreased Interest 0  Down, Depressed, Hopeless 0  PHQ - 2 Score 0    Mini-Cog - 01/03/18 1423    Normal clock drawing test?  yes    How many words correct?  3       Hearing Screening   125Hz  250Hz  500Hz  1000Hz  2000Hz  3000Hz  4000Hz  6000Hz  8000Hz   Right ear:   Pass Pass Pass  Pass    Left ear:   Pass Pass Pass  Pass      Visual Acuity Screening   Right eye Left eye Both eyes  Without correction: 20/25 20/30 20/25   With correction:       GERD on  PPI, Protonix 40 mg, chest discomfort/burning resolved. Burning tongue synd, she takes Gabapentin 300 mg at bedtime,whci helps with pain but not much with burning.  Review of Systems  Constitutional: Negative for activity change, appetite change, fatigue and fever.  HENT: Negative for mouth sores, nosebleeds and trouble swallowing.   Eyes: Negative for redness and visual disturbance.  Respiratory: Negative for cough, shortness of breath and wheezing.   Cardiovascular: Negative for chest pain, palpitations and leg swelling.  Gastrointestinal: Negative for abdominal pain, nausea and vomiting.       Negative for changes in bowel habits.  Endocrine: Negative for polydipsia, polyphagia and polyuria.  Genitourinary: Negative for decreased urine volume, dysuria and hematuria.    Musculoskeletal: Positive for arthralgias. Negative for gait problem.  Skin: Negative for rash and wound.  Allergic/Immunologic: Positive for environmental allergies.  Neurological: Negative for syncope, weakness and headaches.  Psychiatric/Behavioral: Negative for confusion. The patient is nervous/anxious.       Current Outpatient Medications on File Prior to Visit  Medication Sig Dispense Refill  . aspirin 81 MG tablet Take 81 mg by mouth daily.      Marland Kitchen. atorvastatin (LIPITOR) 40 MG tablet Take 1 tablet (40 mg total) by mouth daily. 90 tablet 3  . b complex vitamins tablet Take 1 tablet by mouth daily.      . Calcium Carb-Ergocalciferol 250-125 MG-UNIT TABS Take by mouth.    . calcium-vitamin D (OSCAL WITH D) 250-125 MG-UNIT per tablet Take 1 tablet by mouth daily. Reported on 11/11/2015    . gabapentin (NEURONTIN) 300 MG capsule TAKE ONE CAPSULE BY MOUTH AT BEDTIME 60 capsule 3  . metoprolol succinate (TOPROL XL) 25 MG 24 hr tablet Take 0.5 tablets (12.5 mg total) by mouth 2 (two) times daily. 90 tablet 3  . Multiple Vitamin (MULTIVITAMIN) tablet Take 1 tablet by mouth daily.      . pantoprazole (PROTONIX) 40 MG tablet TAKE ONE TABLET BY MOUTH ONCE DAILY 90 tablet 3  . tiZANidine (ZANAFLEX) 4 MG tablet TAKE ONE TABLET BY MOUTH AT BEDTIME FOR MUSCLE SPASM (Patient taking differently: AS NEEDED) 30 tablet 3   No current facility-administered medications on file prior to visit.      Past Medical History:  Diagnosis Date  . Colon polyps   . Diabetes mellitus   . GERD (gastroesophageal reflux disease)   . Hyperlipidemia    No Known Allergies  Social History   Socioeconomic History  . Marital status: Married    Spouse name: Not on file  . Number of children: Not on file  . Years of education: Not on file  . Highest education level: Not on file  Occupational History  . Not on file  Social Needs  . Financial resource strain: Not on file  . Food insecurity:    Worry: Not on  file    Inability: Not on file  . Transportation needs:    Medical: Not on file    Non-medical: Not on file  Tobacco Use  . Smoking status: Never Smoker  . Smokeless tobacco: Never Used  Substance and Sexual Activity  . Alcohol use: No  . Drug use: No  . Sexual activity: Not on file  Lifestyle  . Physical activity:    Days per week: Not on file    Minutes per session: Not on file  . Stress: Not on file  Relationships  . Social connections:    Talks on phone: Not on file    Gets together:  Not on file    Attends religious service: Not on file    Active member of club or organization: Not on file    Attends meetings of clubs or organizations: Not on file    Relationship status: Not on file  Other Topics Concern  . Not on file  Social History Narrative  . Not on file    Vitals:   01/03/18 1406  BP: 120/80  Pulse: 96  Resp: 12  Temp: 98.4 F (36.9 C)  SpO2: 94%   Body mass index is 24.14 kg/m.   Physical Exam  Nursing note and vitals reviewed. Constitutional: She is oriented to person, place, and time. She appears well-developed. No distress.  HENT:  Head: Normocephalic and atraumatic.  Mouth/Throat: Oropharynx is clear and moist and mucous membranes are normal.  Eyes: Conjunctivae are normal. Right pupil is not round and not reactive.  Cardiovascular: Normal rate and regular rhythm.  No murmur heard. Pulses:      Dorsalis pedis pulses are 2+ on the right side, and 2+ on the left side.  Respiratory: Effort normal and breath sounds normal. No respiratory distress.  GI: Soft. She exhibits no mass. There is no hepatomegaly. There is no tenderness.  Musculoskeletal: She exhibits no edema.  Lymphadenopathy:    She has no cervical adenopathy.  Neurological: She is alert and oriented to person, place, and time. She has normal strength. Coordination normal.  Skin: Skin is warm. No erythema.  Psychiatric: She has a normal mood and affect.  Well groomed, good eye  contact.       ASSESSMENT AND PLAN:   Melissa [name] was seen today for 6 months follow-up.  Orders Placed This Encounter  Procedures  . Pneumococcal conjugate vaccine 13-valent IM  . Hemoglobin A1c  . Microalbumin / creatinine urine ratio  . Basic metabolic panel   Lab Results  Component Value Date   HGBA1C 6.9 (H) 01/03/2018   Lab Results  Component Value Date   CREATININE 1.14 01/03/2018   BUN 20 01/03/2018   NA 141 01/03/2018   K 4.3 01/03/2018   CL 106 01/03/2018   CO2 27 01/03/2018   Lab Results  Component Value Date   MICROALBUR 1.6 01/03/2018     Medicare annual wellness visit, subsequent  We discussed the importance of staying active, physically and mentally, as well as the benefits of a healthy/balance diet. Low impact exercise that involve stretching and strengthing are ideal. Medications reviewed.  Vaccines updated. We discussed preventive screening for the next 5-10 years, summery of recommendations discussed and given in AVS: Periodic eye exam. Fall prevention. Continue Ca++ with vit D. She did not have DEXA done last year,she is not interested in having it done. Mammogram in 2020. Advance directives and end of life discussed, she has POA and living will.     Need for vaccination with 13-polyvalent pneumococcal conjugate vaccine - Pneumococcal conjugate vaccine 13-valent IM  Hyperlipidemia associated with type 2 diabetes mellitus (HCC) Improved. No changes in Lipitor 40 mg daily. Low fat diet to continue. F/U in a year.  Diabetes mellitus type 2, controlled (HCC) HgA1C at goal, 6.9. No changes in current management. Regular exercise and healthy diet with avoidance of added sugar food intake is an important part of treatment and recommended. Annual eye exam, periodic dental and foot care recommended. F/U in 5-6 months   Peripheral arterial disease (HCC) Continue Lipitor 40 mg and Aspirin 81 mg daily. Following with  cardiologist.  Return in about 6 months (around 07/06/2018) for DM II.    Vesper Trant G. [place], MD  [facility]. [place] office.

## 2018-01-03 NOTE — Assessment & Plan Note (Addendum)
HgA1C at goal, 6.9. No changes in current management. Regular exercise and healthy diet with avoidance of added sugar food intake is an important part of treatment and recommended. Annual eye exam, periodic dental and foot care recommended. F/U in 5-6 months

## 2018-01-03 NOTE — Patient Instructions (Signed)
Medication Instructions:  Take metoprolol succinate (Toprol XL) 12.5 mg (1/2 tablet) two times daily  Follow-Up: Your physician wants you to follow-up in: 1 year with Dr. Tresa EndoKelly.  You will receive a reminder letter in the mail two months in advance. If you don't receive a letter, please call our office to schedule the follow-up appointment.   Any Other Special Instructions Will Be Listed Below (If Applicable).     If you need a refill on your cardiac medications before your next appointment, please call your pharmacy.

## 2018-01-03 NOTE — Progress Notes (Signed)
Cardiology Office Note    Date:  01/04/2018   ID:  Melissa [name], DOB 07/27/1934, MRN [id number]  PCP:  Melissa, Betty G, MD  Cardiologist:  Melissa Majestic, MD   History of Present Illness:  Melissa [name] is a 83 y.o. female who presents for a 3 month  F/U cardiology evaluation initially referred through the courtesy of Dr. Betty Melissa for evaluation of chest pain and palpitations.   Melissa [name] admits to 3-4 month history of a "stinging "like chest pain.  This typically is nonexertional, but seems to occur whenever she feels that she has a lot to do and becomes anxious and overloaded.  She was seen by Dr. Betty Melissa on 09/05/2017 and also complained  of retrosternal burning.  She also has noticed at times her pulse is high.  She has a history of hyperlipidemia but has not been regularly taking her statin. She also has a history of possible GERD.  She also has noticed some possible TMJ discomfort.  She denied PND, orthopnea.  She denies presyncope or syncope.   I saw her for initial evaluation on September 20, 2017. I recommended she undergo an echo Doppler assessment.  I also initiated Toprol-XL 25 mg in light of her somewhat atypical chest pain and increased heart rate.  Her echo Doppler study on October 03, 2017 showed an EF of 65 to 70%.  There was severe mitral annular calcification with very mild functional Melissa by valve area.  She has a history of significant lipid abnormality with prior laboratory showing a total cholesterol of 285 and initial LDL cholesterol 173.  She has now been on atorvastatin 40 mg.  Has tolerated the increased regimen and repeat laboratory June 2019 showed a total cholesterol at 176 and LDL cholesterol at 87.  She tells me that her primary physician recently reduced her Toprol dose back to 12.5 mg last week.  On the reduced dose she has noticed her heart rate increasing with her pulse in the 90-100 range.  She presents for evaluation.   Past Medical History:  Diagnosis Date    . Colon polyps   . Diabetes mellitus   . GERD (gastroesophageal reflux disease)   . Hyperlipidemia     Past Surgical History:  Procedure Laterality Date  . ABDOMINAL HYSTERECTOMY    . FOOT SURGERY    . RETINAL DETACHMENT SURGERY    . TONSILLECTOMY      Current Medications: Outpatient Medications Prior to Visit  Medication Sig Dispense Refill  . aspirin 81 MG tablet Take 81 mg by mouth daily.      Marland Kitchen atorvastatin (LIPITOR) 40 MG tablet Take 1 tablet (40 mg total) by mouth daily. 90 tablet 3  . b complex vitamins tablet Take 1 tablet by mouth daily.      . Calcium Carb-Ergocalciferol 250-125 MG-UNIT TABS Take by mouth.    . calcium-vitamin D (OSCAL WITH D) 250-125 MG-UNIT per tablet Take 1 tablet by mouth daily. Reported on 11/11/2015    . gabapentin (NEURONTIN) 300 MG capsule TAKE ONE CAPSULE BY MOUTH AT BEDTIME 60 capsule 3  . Multiple Vitamin (MULTIVITAMIN) tablet Take 1 tablet by mouth daily.      . pantoprazole (PROTONIX) 40 MG tablet TAKE ONE TABLET BY MOUTH ONCE DAILY 90 tablet 3  . tiZANidine (ZANAFLEX) 4 MG tablet TAKE ONE TABLET BY MOUTH AT BEDTIME FOR MUSCLE SPASM (Patient taking differently: AS NEEDED) 30 tablet 3  . Green Tea 315 MG CAPS Take  1 capsule by mouth daily.    Marland Kitchen L-Lysine 1000 MG TABS Take 1 tablet by mouth daily.    . metFORMIN (GLUCOPHAGE) 500 MG tablet TAKE ONE TABLET BY MOUTH WITH BREAKFAST AND TWO WITH SUPPER (Patient taking differently: 500 mg. TAKE ONE TABLET BY MOUTH WITH BREAKFAST AND TWO WITH SUPPER) 270 tablet 2  . metoprolol succinate (TOPROL XL) 25 MG 24 hr tablet Take 1 tablet (25 mg total) by mouth daily. 90 tablet 3   No facility-administered medications prior to visit.      Allergies:   Patient has no known allergies.   Social History   Socioeconomic History  . Marital status: Married    Spouse name: Not on file  . Number of children: Not on file  . Years of education: Not on file  . Highest education level: Not on file  Occupational  History  . Not on file  Social Needs  . Financial resource strain: Not on file  . Food insecurity:    Worry: Not on file    Inability: Not on file  . Transportation needs:    Medical: Not on file    Non-medical: Not on file  Tobacco Use  . Smoking status: Never Smoker  . Smokeless tobacco: Never Used  Substance and Sexual Activity  . Alcohol use: No  . Drug use: No  . Sexual activity: Not on file  Lifestyle  . Physical activity:    Days per week: Not on file    Minutes per session: Not on file  . Stress: Not on file  Relationships  . Social connections:    Talks on phone: Not on file    Gets together: Not on file    Attends religious service: Not on file    Active member of club or organization: Not on file    Attends meetings of clubs or organizations: Not on file    Relationship status: Not on file  Other Topics Concern  . Not on file  Social History Narrative  . Not on file    Socially she is widowed since February 2019.  She previously worked in [employer] in the [place].  She completed 12th grade of education.  There is no tobacco or alcohol use.  She does not exercise.  Family History:  The patient's family history includes Breast cancer in her unknown relative; Heart disease in her unknown relative.  Her mother died at 71 with cancer.  Her father died at age 48 with heart problems.  She has a living brother age 59.  2 sisters are deceased.  ROS General: Negative; No fevers, chills, or night sweats;  HEENT: blind in her right eye.  TMJ discomfort. no sinus congestion, difficulty swallowing Pulmonary: Negative; No cough, wheezing, shortness of breath, hemoptysis Cardiovascular: Negative; No chest pain, presyncope, syncope, palpitations GI: positive for GERD. GU: Negative; No dysuria, hematuria, or difficulty voiding Musculoskeletal: Negative; no myalgias, joint pain, or weakness Hematologic/Oncology: Negative; no easy bruising,  bleeding Endocrine: Negative; no heat/cold intolerance; no diabetes Neuro: Negative; no changes in balance, headaches Skin: Negative; No rashes or skin lesions Psychiatric: Negative; No behavioral problems, depression Sleep: Negative; No snoring, daytime sleepiness, hypersomnolence, bruxism, restless legs, hypnogognic hallucinations, no cataplexy Other comprehensive 14 point system review is negative.   PHYSICAL EXAM:   VS:  BP 115/75   Pulse 96   Ht _0  (1.6 m)   Wt 134 lb (60.8 kg)   BMI 23.74 kg/m  Repeat BP blood pressure by me was 122/64  Wt Readings from Last 3 Encounters:  01/03/18 136 lb 4 oz (61.8 kg)  01/03/18 134 lb (60.8 kg)  09/20/17 133 lb 12.8 oz (60.7 kg)    General: Alert, oriented, no distress.  Skin: normal turgor, no rashes, warm and dry HEENT: Normocephalic, atraumatic; sclera anicteric; extraocular muscles intact; blind in right eye right pupil dilated. Nose without nasal septal hypertrophy Mouth/Parynx benign; Mallinpatti scale 3 Neck: No JVD, no carotid bruits; normal carotid upstroke Lungs: clear to ausculatation and percussion; no wheezing or rales Chest wall: without tenderness to palpitation Heart: PMI not displaced, RRR, s1 s2 normal, 1/6 systolic murmur, no diastolic murmur, no rubs, gallops, thrills, or heaves Abdomen: soft, nontender; no hepatosplenomehaly, BS+; abdominal aorta nontender and not dilated by palpation. Back: no CVA tenderness Pulses 2+ Musculoskeletal: full range of motion, normal strength, no joint deformities Extremities: Varicose veins ; no clubbing cyanosis or edema, Homan's sign negative  Neurologic: grossly nonfocal; Cranial nerves grossly wnl Psychologic: Normal mood and affect    Studies/Labs Reviewed:   EKG:  EKG is ordered today.  ECG (independently read by me): Sinus rhythm at 96 bpm.  No ectopy.  Normal intervals.  September 20, 2017 ECG (independently read by me): Normal Sinus rhythm at 96 bpm.  Possible left  atrial enlargement.  Normal intervals.  Recent Labs: BMP Latest Ref Rng & Units 01/03/2018 12/08/2017 06/07/2017  Glucose 70 - 99 mg/dL 158(H) 106(H) 106(H)  BUN 6 - 23 mg/dL _0 Creatinine 0.40 - 1.20 mg/dL 1.14 0.86 0.84  BUN/Creat Ratio 12 - 28 - 17 -  Sodium 135 - 145 mEq/L 141 140 140  Potassium 3.5 - 5.1 mEq/L 4.3 4.4 4.4  Chloride 96 - 112 mEq/L 106 102 103  CO2 19 - 32 mEq/L _1 Calcium 8.4 - 10.5 mg/dL 9.5 9.5 9.5     Hepatic Function Latest Ref Rng & Units 12/08/2017 02/16/2016 10/17/2012  Total Protein 6.0 - 8.5 [name]/dL 6.6 6.7 7.2  Albumin 3.5 - 4.7 [name]/dL 4.5 4.1 4.2  AST 0 - 40 IU/L _2 ALT 0 - 32 IU/L _3 Alk Phosphatase 39 - 117 IU/L 78 62 57  Total Bilirubin 0.0 - 1.2 mg/dL 0.4 0.3 0.7  Bilirubin, Direct 0.0 - 0.3 mg/dL - - 0.1    CBC Latest Ref Rng & Units 09/02/2009 03/19/2007  WBC 4.5 - 10.5 10*3/microliter 6.2 6.4  Hemoglobin 12.0 - 15.0 [name]/dL 13.2 13.4  Hematocrit 36.0 - 46.0 % 40.4 39.8  Platelets 150.0 - 400.0 K/uL 314.0 337   Lab Results  Component Value Date   MCV 95.5 09/02/2009   MCV 91.4 03/19/2007   Lab Results  Component Value Date   TSH 1.54 08/31/2016   Lab Results  Component Value Date   HGBA1C 6.9 (H) 01/03/2018     BNP No results found for: BNP  ProBNP No results found for: PROBNP   Lipid Panel     Component Value Date/Time   CHOL 176 12/08/2017 1038   TRIG 108 12/08/2017 1038   HDL 67 12/08/2017 1038   CHOLHDL 2.6 12/08/2017 1038   CHOLHDL 3 09/05/2017 1104   VLDL 33.2 09/05/2017 1104   LDLCALC 87 12/08/2017 1038   LDLDIRECT 112.6 01/17/2012 1051     RADIOLOGY: No results found.   Additional studies/ records that were reviewed today include:  I reviewed the records from Dr. Betty Melissa.  ASSESSMENT:    1. Atypical chest pain   2. Palpitations   3. Hyperlipidemia LDL goal <70   4. Mitral annular calcification mild functional Melissa   5. Type 2 diabetes mellitus with complication, with  long-term current use of insulin ([place])   6. Gastroesophageal reflux disease without esophagitis     PLAN:  Melissa. Pami Wool is an 83 year old female who was initially referred to me after experiencing a 3 to 32-monthhistory of stinging nonexertional atypical chest pain.  This seemed to be occurring during periods of increased anxiety and she had been recently widowed.  When I last saw her, I recommended initiation of Toprol-XL 25 mg.  She felt well with this, but her dose was recently reduced to 12.5 mg last week.  She has noticed a heart rate increasing since.  Her most recent echo Doppler study has shown moderate left ventricular hypertrophy with vigorous LV function.  She has severe mitral annular calcification leading to mild functional mitral stenosis by valve area.  She has hyperlipidemia.  Her lipid studies have significantly improved on her increased dose of atorvastatin now at 40 mg daily with LDL being reduced to 87 from initially 173.  Her blood pressure today is stable on her current regimen.  I have suggested she try resuming the Toprol and instead of 25 mg in the morning she will take 12.5 mg twice a day to see if she tolerates this better.  She is diabetic on metformin.  Her GERD is controlled on Protonix.  She sees Dr. Betty JMartiniqueat every 381-monthntervals.  I will see her in 1 year for follow-up evaluation.   Medication Adjustments/Labs and Tests Ordered: Current medicines are reviewed at length with the patient today.  Concerns regarding medicines are outlined above.  Medication changes, Labs and Tests ordered today are listed in the Patient Instructions below. Patient Instructions  Medication Instructions:  Take metoprolol succinate (Toprol XL) 12.5 mg (1/2 tablet) two times daily  Follow-Up: Your physician wants you to follow-up in: 1 year with Dr. KeClaiborne Billings You will receive a reminder letter in the mail two months in advance. If you don't receive a letter, please call our office  to schedule the follow-up appointment.   Any Other Special Instructions Will Be Listed Below (If Applicable).     If you need a refill on your cardiac medications before your next appointment, please call your pharmacy.      Signed, ThShelva MajesticMD  01/04/2018 6:06 PM    [place]  [id number]: [phone]

## 2018-01-03 NOTE — Assessment & Plan Note (Signed)
Continue Lipitor 40 mg and Aspirin 81 mg daily. Following with cardiologist.

## 2018-01-03 NOTE — Assessment & Plan Note (Signed)
Improved. No changes in Lipitor 40 mg daily. Low fat diet to continue. F/U in a year.

## 2018-01-03 NOTE — Patient Instructions (Addendum)
A few things to remember from today's visit:   Medicare annual wellness visit, subsequent  Controlled type 2 diabetes mellitus with other oral complication, without long-term current use of insulin ([place]) - Plan: Comprehensive metabolic panel, Hemoglobin A1c, Microalbumin / creatinine urine ratio  Hyperlipidemia associated with type 2 diabetes mellitus (HCC) - Plan: Lipid panel, Comprehensive metabolic panel  Peripheral arterial disease (HCC)   A few tips:  -As we age balance is not as good as it was, so there is a higher risks for falls. Please remove small rugs and furniture that is "in your way" and could increase the risk of falls. Stretching exercises may help with fall prevention: Yoga and Tai Chi are some examples. Low impact exercise is better, so you are not very achy the next day.  -Sun screen and avoidance of direct sun light recommended. Caution with dehydration, if working outdoors be sure to drink enough fluids.  - Some medications are not safe as we age, increases the risk of side effects and can potentially interact with other medication you are also taken;  including some of over the counter medications. Be sure to let me know when you start a new medication even if it is a dietary/vitamin supplement.   -Healthy diet low in red meet/animal fat and sugar + regular physical activity is recommended.       Screening schedule for the next 5-10 years:   Glaucoma screening/eye exam every annually..  Mammogram for breast cancer screening annually.  Flu vaccine annually.  Fall prevention     Please be sure medication list is accurate. If a new problem present, please set up appointment sooner than planned today.

## 2018-01-04 ENCOUNTER — Encounter: Payer: Self-pay | Admitting: Cardiovascular Disease

## 2018-01-12 ENCOUNTER — Telehealth: Payer: Self-pay | Admitting: Family Medicine

## 2018-01-12 NOTE — Telephone Encounter (Signed)
Will route to office for final disposition of pt request; pt last seen by Dr Betty SwazilandJordan 01/03/18.

## 2018-01-12 NOTE — Telephone Encounter (Signed)
PCP out of office until Monday, 01/15/18. Please advise. Thanks

## 2018-01-12 NOTE — Telephone Encounter (Signed)
Copied from CRM [id number]. Topic: Inquiry >> Jan 12, 2018  8:59 AM Tamela OddiMartin, Melissa, NT wrote: Reason for CRM: Patient called and states that she has a pain in her stomach and she knows what is causing it.. She states she doesn't need an appt because she know what is causing it. She is wondering if Dr. SwazilandJordan can call her in Ciprofloxacin. She states she does have a lit cause of Diverticulitis and she has made sure to stay away form foods that will make it flare up.  You can call if you havre any more questions.  CB# [phone]  [facility] [address] - [place], [place] - [phone] (Phone) [phone] (Fax)

## 2018-01-14 NOTE — Telephone Encounter (Signed)
I recommend follow up with Dr SwazilandJordan if still having abdominal pain.

## 2018-01-15 NOTE — Telephone Encounter (Signed)
Spoke with patient and she stated that she went to urgent care. No further assistance needed.

## 2018-03-06 ENCOUNTER — Encounter: Payer: Self-pay | Admitting: Family Medicine

## 2018-03-06 LAB — HM DIABETES EYE EXAM

## 2018-04-25 ENCOUNTER — Telehealth: Payer: Self-pay | Admitting: *Deleted

## 2018-04-25 NOTE — Telephone Encounter (Signed)
She is not on Flexeril, she is taking Zanaflex at bedtime as needed for back pain. She can increase Zanaflex dose up to 3 times per day, caution with falls because medication causes drowsiness. Follow-up if pain is not better in 3 to 4 weeks, before if pain gets worse. Thanks, BJ

## 2018-04-25 NOTE — Telephone Encounter (Signed)
Copied from CRM [id number]. Topic: General - Other >> Apr 25, 2018  8:16 AM Tamela Oddi wrote: Reason for CRM: Patient called to inform the doctor that she pulled her back and would like for her to call her in some flexeril 10 mg.  Patient stated that she had some at home and took it and it made her back feel much better.  Patient did not want to make an appointment at this time.  Please advise.  CB# [phone] or Cell # [phone]

## 2018-04-26 NOTE — Telephone Encounter (Signed)
Left message on machine for patient to return our call CRM 

## 2018-04-26 NOTE — Telephone Encounter (Signed)
Pt aware of Dr. Elvis Coil message.

## 2018-05-01 ENCOUNTER — Encounter: Payer: Self-pay | Admitting: Family Medicine

## 2018-05-01 ENCOUNTER — Ambulatory Visit: Payer: Medicare Other | Admitting: Family Medicine

## 2018-05-01 ENCOUNTER — Ambulatory Visit (INDEPENDENT_AMBULATORY_CARE_PROVIDER_SITE_OTHER): Payer: Medicare Other

## 2018-05-01 VITALS — BP 120/82 | HR 99 | Temp 98.2°F | Resp 12 | Ht 63.0 in | Wt 137.5 lb

## 2018-05-01 DIAGNOSIS — G8929 Other chronic pain: Secondary | ICD-10-CM | POA: Diagnosis not present

## 2018-05-01 DIAGNOSIS — M545 Low back pain, unspecified: Secondary | ICD-10-CM

## 2018-05-01 DIAGNOSIS — M546 Pain in thoracic spine: Secondary | ICD-10-CM

## 2018-05-01 MED ORDER — TIZANIDINE HCL 4 MG PO TABS: ORAL_TABLET | ORAL | 3 refills | Status: DC

## 2018-05-01 NOTE — Patient Instructions (Signed)
A few things to remember from today's visit:   Acute low back pain without sciatica, unspecified back pain laterality - Plan: DG Lumbar Spine Complete  Chronic bilateral thoracic back pain - Plan: tiZANidine (ZANAFLEX) 4 MG tablet    Back pain is very common in adults.The cause of back pain is rarely dangerous and the pain often gets better over time even with no pharmacologic treatment.  The cause of your back pain may not be known. Some common causes of back pain include: 1. Strain of the muscles or ligaments supporting the spine. 2. Wear and tear (degeneration) of the spinal disks. 3. Arthritis. 4. Direct injury to the back.  For many people, back pain may return. Since back pain is rarely dangerous, most people can learn to manage this condition on their own.  HOME CARE INSTRUCTIONS Watch your back pain for any changes. The following actions may help to lessen any discomfort you are feeling:  1. Remain active. It is stressful on your back to sit or stand in one place for long periods of time. Do not sit, drive, or stand in one place for more than 30 minutes at a time. Take short walks on even surfaces as soon as you are able.Try to increase the length of time you walk each day.  2. Exercise regularly as directed by your health care provider. Exercise helps your back heal faster. It also helps avoid future injury by keeping your muscles strong and flexible.  3. Do not stay in bed.Resting more than 1-2 days can delay your recovery.                                                      4. Pay attention to your body when you bend and lift. The most comfortable positions are those that put less stress on your recovering back.  5.  Always use proper lifting techniques, including: Bending your knees. Keeping the load close to your body. Avoiding twisting.  6. Find a comfortable position to sleep. Use a firm mattress and lie on your side with your knees slightly bent. If you lie on  your back, put a pillow under your knees.  7. Over the counter rubbing medications like Icy Hot or Asper cream with Lidocaine may help without significant side effects.  Acetaminophen and/or Aleve/Ibuprofen can be taken if needed and if not contraindications. Local ice and heat may be alternated to reduce pain and spasms. Also massage and even chiropractor treatment.      Muscle relaxants might or might not help, they cause drowsiness among other    side effects. They could also interact with some of medications you may be already taking (medications for depression/anxiety and some pain medications).   8. Maintain a healthy weight. Excess weight puts extra stress on your back and makes it difficult to maintain good posture.   SEEK MEDICAL CARE IF: worsening pain, associated fever, rash/edema on area, pain going to legs or buttocks, numbness/tingling, night pain, or abnormal weight loss.    SEEK IMMEDIATE MEDICAL CARE IF:  1. You develop new bowel or bladder control problems. 2. You have unusual weakness or numbness in your arms or legs. 3. You develop nausea or vomiting. 4. You develop abdominal pain. 5. You feel faint.     Back Exercises The following exercises strengthen  the muscles that help to support the back. They also help to keep the lower back flexible. Doing these exercises can help to prevent back pain or lessen existing pain. If you have back pain or discomfort, try doing these exercises 2-3 times each day or as told by your health care provider. When the pain goes away, do them once each day, but increase the number of times that you repeat the steps for each exercise (do more repetitions). If you do not have back pain or discomfort, do these exercises once each day or as told by your health care provider.   EXERCISES Single Knee to Chest Repeat these steps 3-5 times for each leg: 5. Lie on your back on a firm bed or the floor with your legs extended. 6. Bring one knee to  your chest. Your other leg should stay extended and in contact with the floor. 7. Hold your knee in place by grabbing your knee or thigh. 8. Pull on your knee until you feel a gentle stretch in your lower back. 9. Hold the stretch for 10-30 seconds. 10. Slowly release and straighten your leg.  Pelvic Tilt Repeat these steps 5-10 times: 2. Lie on your back on a firm bed or the floor with your legs extended. 3. Bend your knees so they are pointing toward the ceiling and your feet are flat on the floor. 4. Tighten your lower abdominal muscles to press your lower back against the floor. This motion will tilt your pelvis so your tailbone points up toward the ceiling instead of pointing to your feet or the floor. 5. With gentle tension and even breathing, hold this position for 5-10 seconds.  Cat-Cow Repeat these steps until your lower back becomes more flexible: 1. Get into a hands-and-knees position on a firm surface. Keep your hands under your shoulders, and keep your knees under your hips. You may place padding under your knees for comfort. 2. Let your head hang down, and point your tailbone toward the floor so your lower back becomes rounded like the back of a cat. 3. Hold this position for 5 seconds. 4. Slowly lift your head and point your tailbone up toward the ceiling so your back forms a sagging arch like the back of a cow. 5. Hold this position for 5 seconds.   Press-Ups Repeat these steps 5-10 times: 6. Lie on your abdomen (face-down) on the floor. 7. Place your palms near your head, about shoulder-width apart. 8. While you keep your back as relaxed as possible and keep your hips on the floor, slowly straighten your arms to raise the top half of your body and lift your shoulders. Do not use your back muscles to raise your upper torso. You may adjust the placement of your hands to make yourself more comfortable. 9. Hold this position for 5 seconds while you keep your back  relaxed. 10. Slowly return to lying flat on the floor.   Bridges Repeat these steps 10 times: 1. Lie on your back on a firm surface. 2. Bend your knees so they are pointing toward the ceiling and your feet are flat on the floor. 3. Tighten your buttocks muscles and lift your buttocks off of the floor until your waist is at almost the same height as your knees. You should feel the muscles working in your buttocks and the back of your thighs. If you do not feel these muscles, slide your feet 1-2 inches farther away from your buttocks. 4. Hold this  position for 3-5 seconds. 5. Slowly lower your hips to the starting position, and allow your buttocks muscles to relax completely. If this exercise is too easy, try doing it with your arms crossed over your chest.    Please be sure medication list is accurate. If a new problem present, please set up appointment sooner than planned today.

## 2018-05-01 NOTE — Progress Notes (Signed)
ACUTE VISIT   HPI:  Chief Complaint  Patient presents with  . Back Pain    lower back pain that started 2 weeks ago that radiates to both sides and the front sometimes    [name] is a 83 y.o. female, who is here today complaining of "tender spots" on lower back. She has Hx of upper back but denies prior Hx of lower back pain.  She denies trauma or unusual physical activity but mentions that a couple days before she had 6 hours drive, sat in the back passenger seat, which she usually rides in the front. "bumpy" road.  Pain is constant, not radiated, she has not noted saddle anesthesia,numbness or tingling of LE's,or changes in urine/bowel continence. Not sure about exacerbating or alleviating factors.  She does not specified intensity but states that she has to take "a lot of Ibuprofen", she has Rx left from recent dental procedure. Denies fever,chills, abdominal pain,changes in bowel habits, or rash.  She has Zanaflex for upper back pain, takes it at night prn.   Hx of cystocele,she followed with gyn. Denies dysuria,increased urinary frequency, gross hematuria,or decreased urine output.  DM 2, reporting BS's as "ok."  Review of Systems  Constitutional: Negative for activity change, appetite change, fatigue and fever.  HENT: Negative for mouth sores and sore throat.   Respiratory: Negative for shortness of breath and wheezing.   Cardiovascular: Negative for leg swelling.  Gastrointestinal: Negative for abdominal pain, nausea and vomiting.       Negative for changes in bowel habits.  Genitourinary: Negative for decreased urine volume, dysuria, hematuria, vaginal bleeding and vaginal discharge.  Musculoskeletal: Positive for arthralgias, back pain and neck pain. Negative for gait problem.  Skin: Negative for rash.  Neurological: Negative for weakness and numbness.  Psychiatric/Behavioral: Negative for confusion. The patient is nervous/anxious.        Current Outpatient Medications on File Prior to Visit  Medication Sig Dispense Refill  . aspirin 81 MG tablet Take 81 mg by mouth daily.      Marland Kitchen atorvastatin (LIPITOR) 40 MG tablet Take 1 tablet (40 mg total) by mouth daily. 90 tablet 3  . b complex vitamins tablet Take 1 tablet by mouth daily.      . Calcium Carb-Ergocalciferol 250-125 MG-UNIT TABS Take by mouth.    . calcium-vitamin D (OSCAL WITH D) 250-125 MG-UNIT per tablet Take 1 tablet by mouth daily. Reported on 11/11/2015    . gabapentin (NEURONTIN) 300 MG capsule TAKE ONE CAPSULE BY MOUTH AT BEDTIME 60 capsule 3  . metFORMIN (GLUCOPHAGE) 500 MG tablet TAKE ONE TABLET BY MOUTH WITH BREAKFAST AND TWO WITH SUPPER 270 tablet 2  . metoprolol succinate (TOPROL XL) 25 MG 24 hr tablet Take 0.5 tablets (12.5 mg total) by mouth 2 (two) times daily. 90 tablet 3  . Multiple Vitamin (MULTIVITAMIN) tablet Take 1 tablet by mouth daily.      . pantoprazole (PROTONIX) 40 MG tablet TAKE ONE TABLET BY MOUTH ONCE DAILY 90 tablet 3   No current facility-administered medications on file prior to visit.      Past Medical History:  Diagnosis Date  . Colon polyps   . Diabetes mellitus   . GERD (gastroesophageal reflux disease)   . Hyperlipidemia    No Known Allergies  Social History   Socioeconomic History  . Marital status: Married    Spouse name: Not on file  . Number of children: Not on file  .  Years of education: Not on file  . Highest education level: Not on file  Occupational History  . Not on file  Social Needs  . Financial resource strain: Not on file  . Food insecurity:    Worry: Not on file    Inability: Not on file  . Transportation needs:    Medical: Not on file    Non-medical: Not on file  Tobacco Use  . Smoking status: Never Smoker  . Smokeless tobacco: Never Used  Substance and Sexual Activity  . Alcohol use: No  . Drug use: No  . Sexual activity: Not on file  Lifestyle  . Physical activity:    Days per  week: Not on file    Minutes per session: Not on file  . Stress: Not on file  Relationships  . Social connections:    Talks on phone: Not on file    Gets together: Not on file    Attends religious service: Not on file    Active member of club or organization: Not on file    Attends meetings of clubs or organizations: Not on file    Relationship status: Not on file  Other Topics Concern  . Not on file  Social History Narrative  . Not on file    Vitals:   05/01/18 1214  BP: 120/82  Pulse: 99  Resp: 12  Temp: 98.2 F (36.8 C)  SpO2: 97%   Body mass index is 24.36 kg/m.   Physical Exam  Nursing note and vitals reviewed. Constitutional: She is oriented to person, place, and time. She appears well-developed and well-nourished. She does not appear ill. No distress.  HENT:  Head: Normocephalic and atraumatic.  Mouth/Throat: Oropharynx is clear and moist and mucous membranes are normal.  Eyes: Conjunctivae are normal.  Respiratory: Effort normal and breath sounds normal. No respiratory distress.  GI: Soft. She exhibits no mass. There is no hepatomegaly. There is no tenderness. There is no CVA tenderness.  Musculoskeletal: She exhibits no edema.  Kyphosis. No tenderness upon palpation of paraspinal muscles. Pain elicited with movement on exam table during examination. No local edema or erythema appreciated, no suspicious lesions.    Neurological: She is alert and oriented to person, place, and time. She has normal strength.  Reflex Scores:      Patellar reflexes are 2+ on the right side and 2+ on the left side. SLR negative bilateral. Mildly unstable gait with no assistance.  Skin: Skin is warm. No rash noted. No erythema.  Psychiatric: Her mood appears anxious.  Well groomed, good eye contact.      ASSESSMENT AND PLAN:  Ms. Melissa [name] was seen today for back pain.  Diagnoses and all orders for this visit:  Acute low back pain without sciatica, unspecified back pain  laterality -     DG Lumbar Spine Complete; Future -     DG Lumbar Spine Complete  Chronic bilateral thoracic back pain -     tiZANidine (ZANAFLEX) 4 MG tablet; TAKE ONE TABLET BY MOUTH AT BEDTIME FOR MUSCLE SPASM     Most likely musculoskeletal and related to OA. Because she is reporting problem as new,lumbar X ray ordered. Further recommendations will be given according to imaging result. Cautions with NSAID's. Tylenol 500 mg tid prn. Asper cream or Icy hot patch may help. She can take Zanaflex daily for 2-3 weeks then prn. Side effects discussed.  Instructed about warning signs. F/U as needed.    Return if symptoms worsen or  fail to improve, for Keep  next appt.     Bryla Burek G. SwazilandJordan, MD  [facility]. [place] office.

## 2018-05-02 ENCOUNTER — Telehealth: Payer: Self-pay | Admitting: *Deleted

## 2018-05-02 NOTE — Telephone Encounter (Signed)
She can take Zanaflex 2 to 4 mg 3 times per day as needed.  I recommended initially at night because it causes drowsiness and it may increase the risk for falls. Thanks, BJ

## 2018-05-02 NOTE — Telephone Encounter (Signed)
Message sent to Dr. Jordan for review and approval. 

## 2018-05-02 NOTE — Telephone Encounter (Signed)
Copied from CRM [id number]. Topic: General - Other >> May 02, 2018 10:01 AM Gean BirchwoodWilliams-Neal, Sade R wrote: Patient states she needs some to take during the day for the pain in her back the zanaflex that was prescribed is only for bedtime but she needs something to help with pain during the day. She also wants to know if her imaging results.  CB# [phone] may leave detailed message

## 2018-05-02 NOTE — Telephone Encounter (Signed)
Spoke with patient and gave instructions per Dr. SwazilandJordan. Patient stated that she would only take the Zanaflex at bedtime because she does get drowsy and she is afraid that she will fall. Patient stated that pain is very bad during the day and have been taking 200 mg Ibuprofen (x 2) for the pain and she wanted to know if she could get something stronger sent to the pharmacy for the pain, also wanted to know what her X-ray results were. Please advise.

## 2018-05-04 NOTE — Telephone Encounter (Signed)
She can also take Acetaminophen 500 mg qid and apply topical icy hot or asper cream. Ortho referral can be arranged if needed or she can try PT.  Thanks, BJ

## 2018-05-04 NOTE — Telephone Encounter (Signed)
Patient given recommendations per Dr. Jordan and verbalized understanding. 

## 2018-05-05 ENCOUNTER — Encounter: Payer: Self-pay | Admitting: Family Medicine

## 2018-05-08 ENCOUNTER — Other Ambulatory Visit: Payer: Self-pay | Admitting: Family Medicine

## 2018-05-08 ENCOUNTER — Telehealth: Payer: Self-pay | Admitting: *Deleted

## 2018-05-08 DIAGNOSIS — K219 Gastro-esophageal reflux disease without esophagitis: Secondary | ICD-10-CM

## 2018-05-08 NOTE — Telephone Encounter (Signed)
Copied from CRM [id number]. Topic: Appointment Scheduling - Transfer of Care >> May 04, 2018 12:00 PM Elliot GaultBell, Tiffany M wrote: Pt is requesting to transfer FROM: Dr. Betty SwazilandJordan  Pt is requesting to transfer TO: Dr. Claiborne BillingsKuneff  Reason for requested transfer: location is closer to patient and friend / patient of Dr. Claiborne BillingsKuneff stated she's a wonderful MD, please advise  Send CRM to patient's current PCP (transferring FROM). >> May 04, 2018  4:42 PM SwazilandJordan, Betty G, MD wrote: Fine with me. Thanks, BJ

## 2018-05-08 NOTE — Telephone Encounter (Signed)
Copied from CRM #[id number]. Topic: Appointment Scheduling - Transfer of Care >> May 04, 2018 12:00 PM Bell, Tiffany M wrote: Pt is requesting to transfer FROM: Dr. Betty Jordan  Pt is requesting to transfer TO: Dr. Kuneff  Reason for requested transfer: location is closer to patient and friend / patient of Dr. Kuneff stated she's a wonderful MD, please advise  Send CRM to patient's current PCP (transferring FROM). >> May 04, 2018  4:42 PM Jordan, Betty G, MD wrote: Fine with me. Thanks, BJ 

## 2018-05-09 NOTE — Telephone Encounter (Signed)
Ok with me. Thanks.  

## 2018-05-09 NOTE — Telephone Encounter (Signed)
Patient's appt has been scheduled.

## 2018-06-25 ENCOUNTER — Other Ambulatory Visit: Payer: Self-pay | Admitting: Family Medicine

## 2018-06-25 DIAGNOSIS — Z1231 Encounter for screening mammogram for malignant neoplasm of breast: Secondary | ICD-10-CM

## 2018-07-03 ENCOUNTER — Ambulatory Visit: Payer: Medicare Other | Admitting: Family Medicine

## 2018-07-03 ENCOUNTER — Encounter: Payer: Self-pay | Admitting: Family Medicine

## 2018-07-03 VITALS — BP 111/63 | HR 91 | Temp 97.7°F | Resp 16 | Ht 60.25 in | Wt 133.0 lb

## 2018-07-03 DIAGNOSIS — E785 Hyperlipidemia, unspecified: Secondary | ICD-10-CM | POA: Diagnosis not present

## 2018-07-03 DIAGNOSIS — R002 Palpitations: Secondary | ICD-10-CM

## 2018-07-03 DIAGNOSIS — K219 Gastro-esophageal reflux disease without esophagitis: Secondary | ICD-10-CM

## 2018-07-03 DIAGNOSIS — IMO0002 Reserved for concepts with insufficient information to code with codable children: Secondary | ICD-10-CM

## 2018-07-03 DIAGNOSIS — E1169 Type 2 diabetes mellitus with other specified complication: Secondary | ICD-10-CM | POA: Diagnosis not present

## 2018-07-03 DIAGNOSIS — E1165 Type 2 diabetes mellitus with hyperglycemia: Secondary | ICD-10-CM

## 2018-07-03 DIAGNOSIS — E11638 Type 2 diabetes mellitus with other oral complications: Secondary | ICD-10-CM

## 2018-07-03 DIAGNOSIS — I739 Peripheral vascular disease, unspecified: Secondary | ICD-10-CM

## 2018-07-03 DIAGNOSIS — E113219 Type 2 diabetes mellitus with mild nonproliferative diabetic retinopathy with macular edema, unspecified eye: Secondary | ICD-10-CM

## 2018-07-03 LAB — COMPREHENSIVE METABOLIC PANEL
ALT: 10 U/L (ref 0–35)
AST: 15 U/L (ref 0–37)
Albumin: 4.3 g/dL (ref 3.5–5.2)
Alkaline Phosphatase: 74 U/L (ref 39–117)
BUN: 20 mg/dL (ref 6–23)
CHLORIDE: 103 meq/L (ref 96–112)
CO2: 27 mEq/L (ref 19–32)
Calcium: 9.8 mg/dL (ref 8.4–10.5)
Creatinine, Ser: 0.98 mg/dL (ref 0.40–1.20)
GFR: 57.79 mL/min — ABNORMAL LOW (ref >60.00)
GLUCOSE: 99 mg/dL (ref 70–99)
POTASSIUM: 5.1 meq/L (ref 3.5–5.1)
SODIUM: 139 meq/L (ref 135–145)
Total Bilirubin: 0.4 mg/dL (ref 0.2–1.2)
Total Protein: 7 g/dL (ref 6.0–8.3)

## 2018-07-03 LAB — CBC
HEMATOCRIT: 34.3 % — AB (ref 36.0–46.0)
HEMOGLOBIN: 11 g/dL — AB (ref 12.0–15.0)
MCHC: 32 g/dL (ref 30.0–36.0)
MCV: 88.2 fl (ref 78.0–100.0)
Platelets: 402 10*3/uL — ABNORMAL HIGH (ref 150.0–400.0)
RBC: 3.89 Mil/uL (ref 3.87–5.11)
RDW: 14.8 % (ref 11.5–15.5)
WBC: 5.9 10*3/uL (ref 4.0–10.5)

## 2018-07-03 LAB — POCT GLYCOSYLATED HEMOGLOBIN (HGB A1C)
HBA1C, POC (CONTROLLED DIABETIC RANGE): 6.5 % (ref 0.0–7.0)
HEMOGLOBIN A1C: 6.5 % (ref 4.0–5.6)
HbA1c, POC (prediabetic range): 6.5 % — AB (ref 5.7–6.4)
Hemoglobin A1C: 6.5 % — AB (ref 4.0–5.6)

## 2018-07-03 LAB — TSH: TSH: 1.76 u[IU]/mL (ref 0.35–4.50)

## 2018-07-03 MED ORDER — ATORVASTATIN CALCIUM 40 MG PO TABS: 40.0000 mg | ORAL_TABLET | Freq: Every day | ORAL | 3 refills | Status: DC

## 2018-07-03 MED ORDER — PANTOPRAZOLE SODIUM 40 MG PO TBEC: 40.0000 mg | DELAYED_RELEASE_TABLET | Freq: Every day | ORAL | 3 refills | Status: DC

## 2018-07-03 MED ORDER — METFORMIN HCL 500 MG PO TABS: 500.0000 mg | ORAL_TABLET | Freq: Two times a day (BID) | ORAL | 5 refills | Status: DC

## 2018-07-03 NOTE — Patient Instructions (Addendum)
Take your metoprolol 1/2 tab every 12 hours. The sensation you are feeling sounds like palpitations. This medicine prevents you from having palpitations.     Palpitations Palpitations are feelings that your heartbeat is not normal. Your heartbeat may feel like it is:  Uneven.  Faster than normal.  Fluttering.  Skipping a beat. This is usually not a serious problem. In some cases, you may need tests to rule out any serious problems. Follow these instructions at home: Pay attention to any changes in your condition. Take these actions to help manage your symptoms: Eating and drinking  Avoid: ? Coffee, tea, soft drinks, and energy drinks. ? Chocolate. ? Alcohol. ? Diet pills. Lifestyle   Try to lower your stress. These things can help you relax: ? Yoga. ? Deep breathing and meditation. ? Exercise. ? Using words and images to create positive thoughts (guided imagery). ? Using your mind to control things in your body (biofeedback).  Do not use drugs.  Get plenty of rest and sleep. Keep a regular bed time. General instructions   Take over-the-counter and prescription medicines only as told by your doctor.  Do not use any products that contain nicotine or tobacco, such as cigarettes and e-cigarettes. If you need help quitting, ask your doctor.  Keep all follow-up visits as told by your doctor. This is important. You may need more tests if palpitations do not go away or get worse. Contact a doctor if:  Your symptoms last more than 24 hours.  Your symptoms occur more often. Get help right away if you:  Have chest pain.  Feel short of breath.  Have a very bad headache.  Feel dizzy.  Pass out (faint). Summary  Palpitations are feelings that your heartbeat is uneven or faster than normal. It may feel like your heart is fluttering or skipping a beat.  Avoid food and drinks that may cause palpitations. These include caffeine, chocolate, and alcohol.  Try to lower  your stress. Do not smoke or use drugs.  Get help right away if you faint or have chest pain, shortness of breath, a severe headache, or dizziness. This information is not intended to replace advice given to you by your health care provider. Make sure you discuss any questions you have with your health care provider. Document Released: 03/15/2008 Document Revised: 07/19/2017 Document Reviewed: 07/19/2017 Elsevier Interactive Patient Education  2019 [employer].

## 2018-07-03 NOTE — Progress Notes (Signed)
Patient ID: Melissa [name], female  DOB: April 26, 1935, 84 y.o.   MRN: [id number] Patient Care Team    Relationship Specialty Notifications Start End  [name], DO PCP - General Family Medicine  07/03/18   Zadie Rhine Clent Demark, MD Consulting Physician Ophthalmology  07/03/18   [facility]  Ophthalmology  07/03/18     Chief Complaint  Patient presents with  . Follow-up    6 mo Follow up. Pt is fasting. DM type 2, Hyperlipidemia, GERD, hx of kidney infections    Subjective:  Melissa [name] is a 84 y.o.  female present for TOC. All past medical history, surgical history, allergies, family history, immunizations, medications and social history were updated in the electronic medical record today. All recent labs, ED visits and hospitalizations within the last year were reviewed.  Diabetes/macular degeneration/nonproliferative retinopathy:  Pt reports compliance with metformin 500 mg twice daily (was prescribed metformin 500/1000). Denies numbness, tingling of extremities, hypo/hyperglycemic events or non-healing wounds.  He is prescribed gabapentin 300 mg nightly for?  Thoracic back pain.  She reports she does not take this every night. PNA series: Completed 2019 Flu shot: Completed 03/2018 (recommneded yearly) BMP: Completed today Foot exam: Completed 01/03/2018 microalbumin: Completed 01/03/2018 Eye exam: Dr. Katy Fitch, routine eye exams with macular degeneration. UTD.  A1c: 6.5  Palpitations/hyperlipidemia/peripheral artery disease: Patient reports she was having symptoms of fluttering in her chest.  She has seen a cardiologist to started her on metoprolol 12.5 mg twice daily.  She reports she is only been taking her metoprolol once a day.  She states she still having the feelings in her chest, especially when laying down at night.  She reports this has been occurring for quite some time.  She denies any anxiety.  She states she does eat quite a bit of chocolate, but does  not admit to other caffeine or stimulant use.  He is compliant with baby aspirin and Lipitor 40 mg daily. Echo Doppler study on October 03, 2017 showed an EF of 65 to 70%.  There was severe mitral annular calcification with very mild functional MS by valve area  GERD: She does have a history of GERD.  She reports she is taking Protonix 40 mg daily.  She has been on this for quite some time and takes it daily. Without medication her symptoms return.   Depression screen [facility] 2/9 07/04/2018 01/03/2018 09/05/2017 08/31/2016 07/26/2013  Decreased Interest 0 0 0 0 0  Down, Depressed, Hopeless 0 0 0 0 0  PHQ - 2 Score 0 0 0 0 0   No flowsheet data found.     Fall Risk  07/04/2018 01/03/2018 08/31/2016 07/26/2013 05/14/2013  Falls in the past year? 0 Yes No No No  Number falls in past yr: 0 1 - - -  Injury with Fall? 0 No - - -  Risk for fall due to : - Impaired balance/gait - - -  Follow up Falls evaluation completed Education provided;Falls prevention discussed - - -    Immunization History  Administered Date(s) Administered  . Influenza Split 04/18/2011, 04/05/2013  . Influenza Whole 03/24/2009, 04/17/2014  . Influenza, High Dose Seasonal PF 03/20/2016  . Influenza, Seasonal, Injecte, Preservative Fre 04/07/2015  . Influenza-Unspecified 03/20/2013, 04/16/2018  . Pneumococcal Conjugate-13 10/02/2013, 10/02/2013, 01/03/2018  . Pneumococcal Polysaccharide-23 06/21/2003, 06/21/2003  . Zoster 08/16/2014    No exam data present  Past Medical History:  Diagnosis Date  . Colon polyps   .  Detached retina, right   . Diabetes mellitus   . GERD (gastroesophageal reflux disease)   . Hyperlipidemia   . Macular degeneration    No Known Allergies Past Surgical History:  Procedure Laterality Date  . ABDOMINAL HYSTERECTOMY    . FOOT SURGERY    . RETINAL DETACHMENT SURGERY    . TONSILLECTOMY     Family History  Problem Relation Age of Onset  . Heart disease Other   . Breast cancer Other    Social  History   Socioeconomic History  . Marital status: Married    Spouse name: Not on file  . Number of children: Not on file  . Years of education: Not on file  . Highest education level: Not on file  Occupational History  . Not on file  Social Needs  . Financial resource strain: Not on file  . Food insecurity:    Worry: Not on file    Inability: Not on file  . Transportation needs:    Medical: Not on file    Non-medical: Not on file  Tobacco Use  . Smoking status: Never Smoker  . Smokeless tobacco: Never Used  Substance and Sexual Activity  . Alcohol use: No  . Drug use: No  . Sexual activity: Not Currently  Lifestyle  . Physical activity:    Days per week: Not on file    Minutes per session: Not on file  . Stress: Not on file  Relationships  . Social connections:    Talks on phone: Not on file    Gets together: Not on file    Attends religious service: Not on file    Active member of club or organization: Not on file    Attends meetings of clubs or organizations: Not on file    Relationship status: Not on file  . Intimate partner violence:    Fear of current or ex partner: Not on file    Emotionally abused: Not on file    Physically abused: Not on file    Forced sexual activity: Not on file  Other Topics Concern  . Not on file  Social History Narrative   Marital status/children/pets: Widowed.   Education/employment: 12th grade education.  Retired.   Safety:      -smoke alarm in the home:Yes     - wears seatbelt: Yes     - Feels safe in their relationships: Yes   Allergies as of 07/03/2018   No Known Allergies     Medication List       Accurate as of July 03, 2018 11:59 PM. Always use your most recent med list.        aspirin 81 MG tablet Take 81 mg by mouth daily.   atorvastatin 40 MG tablet Commonly known as:  LIPITOR Take 1 tablet (40 mg total) by mouth daily.   b complex vitamins tablet Take 1 tablet by mouth daily.   gabapentin 300 MG  capsule Commonly known as:  NEURONTIN TAKE ONE CAPSULE BY MOUTH AT BEDTIME   metFORMIN 500 MG tablet Commonly known as:  GLUCOPHAGE Take 1 tablet (500 mg total) by mouth 2 (two) times daily with a meal.   metoprolol succinate 25 MG 24 hr tablet Commonly known as:  TOPROL XL Take 0.5 tablets (12.5 mg total) by mouth 2 (two) times daily.   multivitamin tablet Take 1 tablet by mouth daily.   pantoprazole 40 MG tablet Commonly known as:  PROTONIX Take 1 tablet (40 mg total)  by mouth daily.   tiZANidine 4 MG tablet Commonly known as:  ZANAFLEX TAKE ONE TABLET BY MOUTH AT BEDTIME FOR MUSCLE SPASM       All past medical history, surgical history, allergies, family history, immunizations andmedications were updated in the EMR today and reviewed under the history and medication portions of their EMR.    Recent Results (from the past 2160 hour(s))  POCT HgB A1C     Status: Abnormal   Collection Time: 07/03/18 10:14 AM  Result Value Ref Range   Hemoglobin A1C 6.5 (A) 4.0 - 5.6 %   HbA1c POC (<> result, manual entry) 6.5 4.0 - 5.6 %   HbA1c, POC (prediabetic range) 6.5 (A) 5.7 - 6.4 %   HbA1c, POC (controlled diabetic range) 6.5 0.0 - 7.0 %  Comp Met (CMET)     Status: Abnormal   Collection Time: 07/03/18 10:47 AM  Result Value Ref Range   Sodium 139 135 - 145 mEq/L   Potassium 5.1 3.5 - 5.1 mEq/L   Chloride 103 96 - 112 mEq/L   CO2 27 19 - 32 mEq/L   Glucose, Bld 99 70 - 99 mg/dL   BUN 20 6 - 23 mg/dL   Creatinine, Ser 0.98 0.40 - 1.20 mg/dL   Total Bilirubin 0.4 0.2 - 1.2 mg/dL   Alkaline Phosphatase 74 39 - 117 U/L   AST 15 0 - 37 U/L   ALT 10 0 - 35 U/L   Total Protein 7.0 6.0 - 8.3 g/dL   Albumin 4.3 3.5 - 5.2 g/dL   Calcium 9.8 8.4 - 10.5 mg/dL   GFR 57.79 (L) >60.00 mL/min  CBC     Status: Abnormal   Collection Time: 07/03/18 10:47 AM  Result Value Ref Range   WBC 5.9 4.0 - 10.5 K/uL   RBC 3.89 3.87 - 5.11 Mil/uL   Platelets 402.0 (H) 150.0 - 400.0 K/uL    Hemoglobin 11.0 (L) 12.0 - 15.0 g/dL   HCT 34.3 (L) 36.0 - 46.0 %   MCV 88.2 78.0 - 100.0 fl   MCHC 32.0 30.0 - 36.0 g/dL   RDW 14.8 11.5 - 15.5 %  TSH     Status: None   Collection Time: 07/03/18 10:47 AM  Result Value Ref Range   TSH 1.76 0.35 - 4.50 uIU/mL    Mm Screening Breast Tomo Bilateral  Result Date: 07/31/2017 CLINICAL DATA:  Screening. EXAM: DIGITAL SCREENING BILATERAL MAMMOGRAM WITH TOMO AND CAD COMPARISON:  Previous exam(s). ACR Breast Density Category b: There are scattered areas of fibroglandular density. FINDINGS: There are no findings suspicious for malignancy. Images were processed with CAD. IMPRESSION: No mammographic evidence of malignancy. A result letter of this screening mammogram will be mailed directly to the patient. RECOMMENDATION: Screening mammogram in one year. (Code:SM-B-01Y) BI-RADS CATEGORY  1: Negative. Electronically Signed   By: Margarette Canada M.D.   On: 07/31/2017 16:59     ROS: 14 pt review of systems performed and negative (unless mentioned in an HPI)  Objective: BP 111/63 (BP Location: Left Arm, Patient Position: Sitting, Cuff Size: Normal)   Pulse 91   Temp 97.7 F (36.5 C) (Oral)   Resp 16   Ht 5' 0.25" (1.53 m)   Wt 133 lb (60.3 kg)   SpO2 100%   BMI 25.76 kg/m  Gen: Afebrile. No acute distress. Nontoxic in appearance, well-developed, well-nourished, pleasant Caucasian female. HENT: AT. [place].  MMM Eyes:Pupils Equal Round Reactive to light, Extraocular movements intact,  Conjunctiva  without redness, discharge or icterus. Neck/lymp/endocrine: Supple, no lymphadenopathy, no thyromegaly CV: RRR no murmur, no edema, +2/4 P posterior tibialis pulses.  No carotid bruits. No JVD. Chest: CTAB, no wheeze, rhonchi or crackles.  Normal respiratory effort.  Good air movement. Abd: Soft. NTND. BS present Skin: Warm and well-perfused. Skin intact. Neuro/Msk: Normal gait. PERLA. EOMi. Alert. Oriented x3.  Psych: Normal affect, dress and demeanor. Normal  speech. Normal thought content and judgment.   Assessment/plan: MARILIN KOFMAN is a 84 y.o. female present for TOC Controlled type 2 diabetes mellitus with other oral complication, without long-term current use of insulin (HCC)/Type 2 DM mild nonproliferative retinopathy, macular edema, uncontrol (HCC) Diabetes is stable.  A1c at 6.5 today.  Continue metformin at which she was currently taking which is 500 mg metformin twice daily. PNA series: Completed 2019 Flu shot: Completed 03/2018 (recommneded yearly) BMP: Completed today Foot exam: Completed 01/03/2018 microalbumin: Completed 01/03/2018 Eye exam: Dr. Katy Fitch, routine eye exams with macular degeneration. UTD.  A1c: 6.5 today - POCT HgB A1C - metFORMIN (GLUCOPHAGE) 500 MG tablet; Take 1 tablet (500 mg total) by mouth 2 (two) times daily with a meal.  Dispense: 180 tablet; Refill: 5 Follow-up 3-4 months.  Hyperlipidemia associated with type 2 diabetes mellitus (HCC)/Peripheral arterial disease (HCC)/Palpitations -Her describes symptoms seem to be similar to palpitation.  Advised her to avoid caffeine, stimulants-her vice is excess chocolate.  Collect labs today to look at causes such as anemia, relates or thyroid disorder.  She does not seem to be particularly anxious.  However if unable to get palpitations controlled would have to take that into consideration as potential cause.  Currently she is not taking the metoprolol as prescribed and her heart rate is elevated, she could use a full dose of the 25 mg metoprolol. - Lipid 12/08/2017 look great.  Continue atorvastatin 40 mg daily. -Continue baby aspirin daily. - Start taking metoprolol half tab twice daily for 1 tab daily (She is prescribed metoprolol XL) - Comp Met (CMET) - CBC - TSH  Gastroesophageal reflux disease without esophagitis - controlled with Protonix.  - Takes B-complex.  - follow b12, mag and vit d q 3 years - pantoprazole (PROTONIX) 40 MG tablet; Take 1 tablet (40 mg  total) by mouth daily.  Dispense: 90 tablet; Refill: 3    Return in about 4 months (around 11/01/2018) for DM/HLD.   Note is dictated utilizing voice recognition software. Although note has been proof read prior to signing, occasional typographical errors still can be missed. If any questions arise, please do not hesitate to call for verification.  Electronically signed by: Howard Pouch, DO [place]

## 2018-07-04 ENCOUNTER — Telehealth: Payer: Self-pay | Admitting: Family Medicine

## 2018-07-04 ENCOUNTER — Encounter: Payer: Self-pay | Admitting: Family Medicine

## 2018-07-04 DIAGNOSIS — R002 Palpitations: Secondary | ICD-10-CM | POA: Insufficient documentation

## 2018-07-04 DIAGNOSIS — D649 Anemia, unspecified: Secondary | ICD-10-CM

## 2018-07-04 DIAGNOSIS — D509 Iron deficiency anemia, unspecified: Secondary | ICD-10-CM | POA: Insufficient documentation

## 2018-07-04 NOTE — Telephone Encounter (Signed)
Telephone call to Patient to inform her of her labs  And schedule her for a lab appointment..  Patient schedule for f/U lab appointment.   07/18/18.  At.  09:45am.  Patient voices understanding.

## 2018-07-04 NOTE — Telephone Encounter (Signed)
LM for pt to CB. Okay for PEC to discuss lab results and sch a lab only visit.

## 2018-07-04 NOTE — Telephone Encounter (Signed)
Please inform patient the following information: Her labs are all stable with the exception she has mild anemia.  Sometimes this can cause people to feel fatigued or have palpitations.  And she was having palpitations I would like her to make a lab appointment to check her iron levels.  Please make a lab appointment only for her and once we get the results we will decide next step.

## 2018-07-18 ENCOUNTER — Other Ambulatory Visit (INDEPENDENT_AMBULATORY_CARE_PROVIDER_SITE_OTHER): Payer: Medicare Other

## 2018-07-18 ENCOUNTER — Ambulatory Visit: Payer: Medicare Other | Admitting: Family Medicine

## 2018-07-18 ENCOUNTER — Encounter: Payer: Self-pay | Admitting: Family Medicine

## 2018-07-18 VITALS — BP 123/69 | HR 97 | Temp 97.7°F | Resp 16 | Ht 60.0 in | Wt 135.0 lb

## 2018-07-18 DIAGNOSIS — D649 Anemia, unspecified: Secondary | ICD-10-CM | POA: Diagnosis not present

## 2018-07-18 DIAGNOSIS — S8991XA Unspecified injury of right lower leg, initial encounter: Secondary | ICD-10-CM | POA: Diagnosis not present

## 2018-07-18 MED ORDER — NAPROXEN 500 MG PO TABS: 500.0000 mg | ORAL_TABLET | Freq: Two times a day (BID) | ORAL | 0 refills | Status: DC

## 2018-07-18 NOTE — Patient Instructions (Addendum)
Start naproxen 1/2 tab every 12 hours for 5-7 days, then as needed for pain.  Rest, elevate knee. Avoid twisting knee.  Ice can help.  Follow up in 2 weeks if not improving  Medial Collateral Knee Ligament Sprain  The medial collateral ligament (MCL) is a tough band of tissue in the knee that connects the thigh bone to the shin bone. Your MCL prevents your knee from moving too far inward and helps to keep your knee stable. An MCL sprain is an injury that is caused by stretching the MCL too far. The injury can involve a tear in the MCL. What are the causes? This condition may be caused by:  A hard, direct hit (blow) to the inside of your knee (common).  Your knee falling inward when you run, change directions quickly (cut), jump, or pivot.  Repeatedly overstretching the MCL. What increases the risk? The following factors make you more likely to develop this condition:  Playing contact sports, such as wrestling or football.  Participating in sports that involve cutting, like hockey, skiing, or soccer.  Having weak hip and core muscles. What are the signs or symptoms? Symptoms of this condition include:  A popping sound at the time of injury.  Pain on the inside of the knee.  Swelling in the knee.  Bruising around the knee.  Tenderness when pressing the inside of the knee.  Feeling unstable when you stand, like your knee will give way.  Difficulty walking on uneven surfaces. How is this diagnosed? This condition may be diagnosed based on:  Your medical history.  A physical exam.  Tests, such as an X-ray or MRI. During your physical exam, your health care provider will check for pain, limited motion, and instability. How is this treated? Treatment for this condition depends on how severe the injury is. Treatment may include:  Keeping weight off the knee until swelling and pain improve.  Raising (elevating) the knee above the level of your heart. This helps to reduce  swelling.  Icing the knee. This helps to reduce swelling.  Taking an NSAID. This helps to reduce pain and swelling.  Using a knee brace, elastic sleeve, or crutches while the injury heals.  Using a knee brace when participating in athletic activities.  Doing rehab exercises (physical therapy).  Surgery. This may be needed if: ? Your MCL tore all the way through. ? Your knee is unstable. ? Your knee is not getting better with other treatments. Follow these instructions at home: If you have a brace or sleeve:  Wear it as told by your health care provider. Remove it only as told by your health care provider.  Loosen the brace or remove the sleeve if your toes tingle, become numb, or turn cold and blue.  Do not let your brace or sleeve get wet if it is not waterproof.  Keep the brace or sleeve clean. Managing pain, stiffness, and swelling  If directed, apply ice to the inside of your knee. ? Put ice in a plastic bag. ? Place a towel between your skin and the bag. ? Leave the ice on for 20 minutes, 2-3 times a day.  Move your foot and toes often to avoid stiffness and to lessen swelling.  Elevate your knee above the level of your heart while you are sitting or lying down. Driving  Ask your health care provider when it is safe to drive if you have a brace or sleeve on your leg. Activity  Return  to your normal activities as told by your health care provider. Ask your health care provider what activities are safe for you.  Do exercises as told by your health care provider. Safety  Do not use the injured limb to support your body weight until your health care provider says that you can. Use crutches as told by your health care provider. General instructions  Take over-the-counter and prescription medicines only as told by your health care provider.  Keep all follow-up visits as told by your health care provider. This is important. How is this prevented?  Warm up and  stretch before being active.  Cool down and stretch after being active.  Give your body time to rest between periods of activity.  Make sure to use equipment that fits you.  Be safe and responsible while being active to avoid falls.  Do at least 150 minutes of moderate-intensity exercise each week, such as brisk walking or water aerobics.  Maintain physical fitness, including: ? Strength. ? Flexibility. ? Cardiovascular fitness. ? Endurance. Contact a health care provider if:  Your symptoms do not improve.  Your symptoms get worse. This information is not intended to replace advice given to you by your health care provider. Make sure you discuss any questions you have with your health care provider. Document Released: 06/06/2005 Document Revised: 02/09/2016 Document Reviewed: 04/18/2015 Elsevier Interactive Patient Education  2019 [employer].

## 2018-07-18 NOTE — Progress Notes (Signed)
Melissa [name] , Oct 27, 1934, 84 y.o., female MRN: [id number] Patient Care Team    Relationship Specialty Notifications Start End  Natalia Leatherwood, DO PCP - General Family Medicine  07/03/18   Luciana Axe Alford Highland, MD Consulting Physician Ophthalmology  07/03/18   [facility]  Ophthalmology  07/03/18   Allie Bossier, MD Consulting Physician Obstetrics and Gynecology  07/04/18   Lennette Bihari, MD Consulting Physician Cardiology  07/04/18     Chief Complaint  Patient presents with  . Knee Pain    Right knee pain x6 days. Pt was going down stairs at [place] and twisted knee due to how big the step was. No swelling or brusing per patient. Pt has full ROM      Subjective: Pt presents for an OV with complaints of right knee pain  of 6 days duration.  Associated symptoms include pain started with walking after a twist injury stepping down off a bus in [place]. She states it never really become swollen or red. She was able to continue walking that day. However, the days after and walking in airport was difficult to walk. She states getting in/out of car is worse. She has tried tylenol, ice and compression sleeve. She has never had an injury in the past to her knee. She reports it is improved some since her injury and she has been out running errands today.   Depression screen [facility] 2/9 07/04/2018 01/03/2018 09/05/2017 08/31/2016 07/26/2013  Decreased Interest 0 0 0 0 0  Down, Depressed, Hopeless 0 0 0 0 0  PHQ - 2 Score 0 0 0 0 0    No Known Allergies Social History   Tobacco Use  . Smoking status: Never Smoker  . Smokeless tobacco: Never Used  Substance Use Topics  . Alcohol use: No   Past Medical History:  Diagnosis Date  . Colon polyps   . Cystocele with prolapse 2019   Dr. Marice Potter- Monitor for now.   . Detached retina, right   . Diabetes mellitus   . Diaphragmatic hernia   . GERD (gastroesophageal reflux disease)   . Hyperlipidemia   . Insomnia   . Macular degeneration     Past Surgical History:  Procedure Laterality Date  . ABDOMINAL HYSTERECTOMY    . FOOT SURGERY    . RETINAL DETACHMENT SURGERY    . TONSILLECTOMY     Family History  Problem Relation Age of Onset  . Heart disease Other   . Breast cancer Other    Allergies as of 07/18/2018   No Known Allergies     Medication List       Accurate as of July 18, 2018  2:24 PM. Always use your most recent med list.        aspirin 81 MG tablet Take 81 mg by mouth daily.   atorvastatin 40 MG tablet Commonly known as:  LIPITOR Take 1 tablet (40 mg total) by mouth daily.   b complex vitamins tablet Take 1 tablet by mouth daily.   gabapentin 300 MG capsule Commonly known as:  NEURONTIN TAKE ONE CAPSULE BY MOUTH AT BEDTIME   metFORMIN 500 MG tablet Commonly known as:  GLUCOPHAGE Take 1 tablet (500 mg total) by mouth 2 (two) times daily with a meal.   metoprolol succinate 25 MG 24 hr tablet Commonly known as:  TOPROL XL Take 0.5 tablets (12.5 mg total) by mouth 2 (two) times daily.   multivitamin tablet Take 1 tablet  by mouth daily.   pantoprazole 40 MG tablet Commonly known as:  PROTONIX Take 1 tablet (40 mg total) by mouth daily.   tiZANidine 4 MG tablet Commonly known as:  ZANAFLEX TAKE ONE TABLET BY MOUTH AT BEDTIME FOR MUSCLE SPASM       All past medical history, surgical history, allergies, family history, immunizations andmedications were updated in the EMR today and reviewed under the history and medication portions of their EMR.     ROS: Negative, with the exception of above mentioned in HPI   Objective:  BP 123/69 (BP Location: Right Arm, Patient Position: Sitting, Cuff Size: Normal)   Pulse 97   Temp 97.7 F (36.5 C) (Oral)   Resp 16   Ht 5' (1.524 m)   Wt 135 lb (61.2 kg)   SpO2 95%   BMI 26.37 kg/m  Body mass index is 26.37 kg/m. Gen: Afebrile. No acute distress. Nontoxic in appearance, well developed, well nourished.  MSK: no erythema, mild soft  tissue swelling present superior medial aspect of knee. No TTP over joint lines, area of swelling or bone prominences of knee. No ligamane laxity or pain with motion. 5/5 MS bilateral LE, ADD/ABB of leg against resistance is strong and without pain. NV intact.   Skin: no rashes, purpura or petechiae.  Neuro:  Normal gait. PERLA. EOMi. Alert. Oriented x3   No exam data present No results found. No results found for this or any previous visit (from the past 24 hour(s)).  Assessment/Plan: Melissa [name] is a 84 y.o. female present for OV for  Injury of right knee, initial encounter Suspect MCL strain by area of swelling and her report of twisting injury with pain only on weighted adduction of knee. She is not tender over the mensicus.  - REST, ICE, COMPRESSION. Avoid twisting of knee (elected not to place in full immobilizer given age and fear of fall. Elevate leg when able.  - naproxen 500 mg (1/2 tab) BID for 5-7 days then 12h PRN for pain. - f/u 2 weeks if not resolved.    Reviewed expectations re: course of current medical issues.  Discussed self-management of symptoms.  Outlined signs and symptoms indicating need for more acute intervention.  Patient verbalized understanding and all questions were answered.  Patient received an After-Visit Summary.    No orders of the defined types were placed in this encounter.  > 25 minutes spent with patient, >50% of time spent face to face    Note is dictated utilizing voice recognition software. Although note has been proof read prior to signing, occasional typographical errors still can be missed. If any questions arise, please do not hesitate to call for verification.   electronically signed by:  Felix Pacini, DO  [facility] [place]

## 2018-07-19 ENCOUNTER — Telehealth: Payer: Self-pay | Admitting: Family Medicine

## 2018-07-19 DIAGNOSIS — D509 Iron deficiency anemia, unspecified: Secondary | ICD-10-CM

## 2018-07-19 LAB — IRON,TIBC AND FERRITIN PANEL
%SAT: 7 % (calc) — ABNORMAL LOW (ref 16–45)
Ferritin: 8 ng/mL — ABNORMAL LOW (ref 16–288)
Iron: 27 ug/dL — ABNORMAL LOW (ref 45–160)
TIBC: 394 mcg/dL (calc) (ref 250–450)

## 2018-07-19 MED ORDER — FERROUS SULFATE 324 (65 FE) MG PO TBEC: 1.0000 | DELAYED_RELEASE_TABLET | Freq: Two times a day (BID) | ORAL | 2 refills | Status: DC

## 2018-07-19 NOTE — Telephone Encounter (Signed)
Please inform patient the following information: Labs indicate that her anemia is secondary to iron deficiency.  Her iron stores are significantly low.  Low iron and anemia can cause palpitations and fatigue. - I have called in an iron supplement to take twice a day with a meal. Use of vit c supplement with iron or drinking supplement with OJ can help with absorption.  - We do need to figure out why she is so low on iron. Most common sources are low dietary consumption or absorption and blood loss from the colon.  - Please explain to her FOBT cards- and have her either pick up or we can mail her a set- she should complete asap  - I was able to see in the EMR she had a colonoscopy in 2013 with polyps removed- at that time they recommend she have another colonoscopy in 2018- which I do not see evidence of her completing. I would recommend she call her GI to discuss.  Start iron. Watch for constipation with iron- most need to take a stool softener with it to prevent constipation. Follow up 3 months.

## 2018-07-20 NOTE — Telephone Encounter (Signed)
Patient advised of results and recommendations, voiced understanding.  FOBT cards explained and mailed to patient to return at her earliest convenience.   Patient will contact GI - she was under the impression that after 77 she did not need colonoscopy anymore but she will call them to get their recommendations.   Patient had no questions at this time about any information that was relayed to her.

## 2018-07-31 ENCOUNTER — Ambulatory Visit: Payer: Medicare Other | Admitting: Family Medicine

## 2018-08-01 ENCOUNTER — Ambulatory Visit
Admission: RE | Admit: 2018-08-01 | Discharge: 2018-08-01 | Disposition: A | Discharge status: Home / Self Care | Payer: Medicare Other | Source: Ambulatory Visit | Attending: Family Medicine | Admitting: Family Medicine

## 2018-08-01 DIAGNOSIS — Z1231 Encounter for screening mammogram for malignant neoplasm of breast: Secondary | ICD-10-CM

## 2018-08-03 ENCOUNTER — Encounter: Payer: Self-pay | Admitting: Family Medicine

## 2018-08-03 ENCOUNTER — Ambulatory Visit: Payer: Medicare Other | Admitting: Family Medicine

## 2018-08-03 VITALS — BP 116/75 | HR 99 | Temp 97.8°F | Resp 16 | Ht 60.0 in | Wt 134.1 lb

## 2018-08-03 DIAGNOSIS — M545 Low back pain, unspecified: Secondary | ICD-10-CM | POA: Insufficient documentation

## 2018-08-03 DIAGNOSIS — M5136 Other intervertebral disc degeneration, lumbar region: Secondary | ICD-10-CM | POA: Diagnosis not present

## 2018-08-03 DIAGNOSIS — G8929 Other chronic pain: Secondary | ICD-10-CM | POA: Insufficient documentation

## 2018-08-03 DIAGNOSIS — M51369 Other intervertebral disc degeneration, lumbar region without mention of lumbar back pain or lower extremity pain: Secondary | ICD-10-CM

## 2018-08-03 DIAGNOSIS — M546 Pain in thoracic spine: Secondary | ICD-10-CM

## 2018-08-03 MED ORDER — TRAMADOL HCL 50 MG PO TABS: 50.0000 mg | ORAL_TABLET | Freq: Every day | ORAL | 0 refills | Status: DC | PRN

## 2018-08-03 MED ORDER — TIZANIDINE HCL 4 MG PO TABS: ORAL_TABLET | ORAL | 1 refills | Status: DC

## 2018-08-03 MED ORDER — METHYLPREDNISOLONE ACETATE 80 MG/ML IJ SUSP
80.0000 mg | Freq: Once | INTRAMUSCULAR | Status: AC
  Administered 2018-08-03: 80 mg via INTRAMUSCULAR

## 2018-08-03 NOTE — Patient Instructions (Addendum)
Buy some capsicin cream over the area. This is over the counter.  Continue the zanaflex at night when needed.  Stop the gabapentin use.  Steroid shot today.   Tramadol prescribed, use once a day as needed for pain. This is a controlled substance, if we need to continue we will need to see you back. F/u 1 month

## 2018-08-03 NOTE — Progress Notes (Signed)
Melissa LowersMartha N [name] , 10/06/1934, 84 y.o., female MRN: [id number] Patient Care Team    Relationship Specialty Notifications Start End  Natalia LeatherwoodKuneff, Jeffrie Stander A, DO PCP - General Family Medicine  07/03/18   Melissa Axeankin, Alford HighlandGary A, MD Consulting Physician Ophthalmology  07/03/18   [name]  07/03/18   Allie Bossierove, Myra C, MD Consulting Physician Obstetrics and Gynecology  07/04/18   Lennette BihariKelly, Thomas A, MD Consulting Physician Cardiology  07/04/18     Chief Complaint  Patient presents with  . Back Pain    Back pain since 03/2018. Xrays performed and MD said it was due to age. Pt is asking for something for pain.      Subjective:  Melissa [name] is [name] 84 y.o. female present for low back pain. She was seen by another provider in November with same issue. She was prescribed zanaflex. She states that works ok and she takes nightly. However over the last week she has had increased pain after working in her yard. She points to the right lower back, sometimes radiation to her right knee- but not always. No bladder or bowel changes. Lumbar xray reviewed.   Prior appt - right knee.  Pt presents for an OV with complaints of right knee pain  of 6 days duration.  Associated symptoms include pain started with walking after [name] twist injury stepping [place] off [name] bus [place]. She states it never really become swollen or red. She was able to continue walking that day. However, the days after and walking in airport was difficult to walk. She states getting in/out of car is worse. She has tried tylenol, ice and compression sleeve. She has never had an injury in the past to her knee. She reports it is improved some since her injury and she has been out running errands today.   05/01/2018 LUMBAR SPINE - COMPLETE 4+ VIEW COMPARISON:  CT abdomen pelvis of 05/15/2007 FINDINGS: There is little change in alignment of the lumbar vertebrae with retrolisthesis of L2 on L3 by approximately 7-8 mm. There is degenerative  disc disease at T12-L1, L1-2, and L2-3 levels. No compression deformity is seen. Moderate abdominal aortic atherosclerosis is noted. The SI joints appear corticated. IMPRESSION: 1. No change in alignment with retrolisthesis of L2 on L3 and degenerative disc disease at T12-L1, L1-2, and L2-3 levels. 2. Moderate abdominal aortic atherosclerosis  Depression screen Melissa [name] 2/9 07/04/2018 01/03/2018 09/05/2017 08/31/2016 07/26/2013  Decreased Interest 0 0 0 0 0  Down, Depressed, Hopeless 0 0 0 0 0  PHQ - 2 Score 0 0 0 0 0    No Known Allergies Social History   Tobacco Use  . Smoking status: Never Smoker  . Smokeless tobacco: Never Used  Substance Use Topics  . Alcohol use: No   Past Medical History:  Diagnosis Date  . Colon polyps   . Cystocele with prolapse 2019   Dr. Marice Potterove- Monitor for now.   . Detached retina, right   . Diabetes mellitus   . Diaphragmatic hernia   . GERD (gastroesophageal reflux disease)   . Hyperlipidemia   . Insomnia   . Macular degeneration    Past Surgical History:  Procedure Laterality Date  . ABDOMINAL HYSTERECTOMY    . FOOT SURGERY    . RETINAL DETACHMENT SURGERY    . TONSILLECTOMY     Family History  Problem Relation Age of Onset  . Heart disease Other   . Breast cancer Other  Allergies as of 08/03/2018   No Known Allergies     Medication List       Accurate as of August 03, 2018  1:02 PM. Always use your most recent med list.        aspirin 81 MG tablet Take 81 mg by mouth daily.   atorvastatin 40 MG tablet Commonly known as:  LIPITOR Take 1 tablet (40 mg total) by mouth daily.   b complex vitamins tablet Take 1 tablet by mouth daily.   ferrous sulfate 324 (65 Fe) MG Tbec Take 1 tablet (325 mg total) by mouth 2 (two) times daily with [name] meal.   gabapentin 300 MG capsule Commonly known as:  NEURONTIN TAKE ONE CAPSULE BY MOUTH AT BEDTIME   metFORMIN 500 MG tablet Commonly known as:  GLUCOPHAGE Take 1 tablet (500 mg total) by  mouth 2 (two) times daily with [name] meal.   metoprolol succinate 25 MG 24 hr tablet Commonly known as:  TOPROL XL Take 0.5 tablets (12.5 mg total) by mouth 2 (two) times daily.   multivitamin tablet Take 1 tablet by mouth daily.   naproxen 500 MG tablet Commonly known as:  NAPROSYN Take 1 tablet (500 mg total) by mouth 2 (two) times daily with [name] meal.   pantoprazole 40 MG tablet Commonly known as:  PROTONIX Take 1 tablet (40 mg total) by mouth daily.   tiZANidine 4 MG tablet Commonly known as:  ZANAFLEX TAKE ONE TABLET BY MOUTH AT BEDTIME FOR MUSCLE SPASM       All past medical history, surgical history, allergies, family history, immunizations andmedications were updated in the EMR today and reviewed under the history and medication portions of their EMR.     ROS: Negative, with the exception of above mentioned in HPI   Objective:  BP 116/75 (BP Location: Left Arm, Patient Position: Sitting, Cuff Size: Normal)   Pulse 99   Temp 97.8 F (36.6 C) (Oral)   Resp 16   Ht 5' (1.524 m)   Wt 134 lb 2 oz (60.8 kg)   SpO2 97%   BMI 26.19 kg/m  Body mass index is 26.19 kg/m. Gen: Afebrile. No acute distress.  Skin: No rashes, purpura or petechiae.  Neuro: Normal gait. PERLA. EOMi. Alert. Oriented.  No erythema or bony tenderness.  No step-off or spine.  No tenderness to palpation.  Full range of motion with mild discomfort on bilateral sidebending.  Muscle strength 5/5 lower extremity extremity. DTRs equal bilaterally.   No exam data present No results found. No results found for this or any previous visit (from the past 24 hour(s)).  Assessment/Plan: Melissa [name] is [name] 84 y.o. female present for OV for  Acute right-sided low back pain without sciatica/Lumbar degenerative disc disease -History of lumbar degenerative disc disease with acute flare after gardening. - IM depo medrol injection to day for acute flare.  - Avoid NSAIDS with IDA of unknown cause/source.  - offered  PT- she wants to see what the meds do first.  - continue zanaflex. DC gabapentin -OTC capsaicin cream - tiZANidine (ZANAFLEX) 4 MG tablet; TAKE ONE TABLET BY MOUTH AT BEDTIME FOR MUSCLE SPASM  Dispense: 90 tablet; Refill: 1 - tramaodol 50 QD prn prescribed for current flare and then as needed.  - f/u 1 month. If improved with med, will provide additional refills.     Reviewed expectations re: course of current medical issues.  Discussed self-management of symptoms.  Outlined signs and symptoms indicating need  for more acute intervention.  Patient verbalized understanding and all questions were answered.  Patient received an After-Visit Summary.    No orders of the defined types were placed in this encounter.  > 25 minutes spent with patient, >50% of time spent face to face    Note is dictated utilizing voice recognition software. Although note has been proof read prior to signing, occasional typographical errors still can be missed. If any questions arise, please do not hesitate to call for verification.   electronically signed by:  Felix Pacini, DO  [facility] [place]

## 2018-08-06 ENCOUNTER — Encounter: Payer: Self-pay | Admitting: Family Medicine

## 2018-09-04 ENCOUNTER — Encounter: Payer: Self-pay | Admitting: Family Medicine

## 2018-09-04 ENCOUNTER — Ambulatory Visit: Payer: Medicare Other | Admitting: Family Medicine

## 2018-09-04 ENCOUNTER — Other Ambulatory Visit: Payer: Self-pay

## 2018-09-04 VITALS — BP 120/64 | HR 87 | Temp 98.2°F | Resp 16 | Ht 60.0 in | Wt 132.4 lb

## 2018-09-04 DIAGNOSIS — D509 Iron deficiency anemia, unspecified: Secondary | ICD-10-CM | POA: Diagnosis not present

## 2018-09-04 DIAGNOSIS — G8929 Other chronic pain: Secondary | ICD-10-CM | POA: Diagnosis not present

## 2018-09-04 DIAGNOSIS — M546 Pain in thoracic spine: Secondary | ICD-10-CM

## 2018-09-04 NOTE — Progress Notes (Signed)
Melissa [name] , 1935-05-14, 84 y.o., female MRN: [id number] Patient Care Team    Relationship Specialty Notifications Start End  Natalia Leatherwood, DO PCP - General Family Medicine  07/03/18   Luciana Axe Alford Highland, MD Consulting Physician Ophthalmology  07/03/18   [facility]  Op[facility]mology  07/03/18   Allie Bossier, MD Consulting Physician Obstetrics and Gynecology  07/04/18   Lennette Bihari, MD Consulting Physician Cardiology  07/04/18     Chief Complaint  Patient presents with  . Follow-up    Back pain is better per patient.      Subjective:  Melissa [name] is a 84 y.o. female present for follow up low back pain.  IDA:  She asks about her IDA today. F/U due in 4 weeks for lab appt only after 3 mos of iron BID.  She will complete her FOBT.  Back pain:  Pt reports she is much better after steroid injection. No longer needing tramadol or muscle relaxer. Feeling much better. Has been able to garden and clean. She is happy with results.  Prior note:  She was seen by another provider in November with same issue. She was prescribed zanaflex. She states that works ok and she takes nightly. However over the last week she has had increased pain after working in her yard. She points to the right lower back, sometimes radiation to her right knee- but not always. No bladder or bowel changes. Lumbar xray reviewed.   Prior appt - right knee.  Pt presents for an OV with complaints of right knee pain  of 6 days duration.  Associated symptoms include pain started with walking after a twist injury stepping down off a bus in [place]. She states it never really become swollen or red. She was able to continue walking that day. However, the days after and walking in airport was difficult to walk. She states getting in/out of car is worse. She has tried tylenol, ice and compression sleeve. She has never had an injury in the past to her knee. She reports it is improved some since her injury and she  has been out running errands today.   Depression screen [facility] 2/9 07/04/2018 01/03/2018 09/05/2017 08/31/2016 07/26/2013  Decreased Interest 0 0 0 0 0  Down, Depressed, Hopeless 0 0 0 0 0  PHQ - 2 Score 0 0 0 0 0    No Known Allergies Social History   Social History Narrative   Marital status/children/pets: Widowed.   Education/employment: 12th grade education.  Retired.   Safety:      -smoke alarm in the home:Yes     - wears seatbelt: Yes     - Feels safe in their relationships: Yes   Past Medical History:  Diagnosis Date  . Colon polyps   . Cystocele with prolapse 2019   Dr. Marice Potter- Monitor for now.   . Detached retina, right   . Diabetes mellitus   . Diaphragmatic hernia   . GERD (gastroesophageal reflux disease)   . Hyperlipidemia   . Insomnia   . Macular degeneration    Past Surgical History:  Procedure Laterality Date  . ABDOMINAL HYSTERECTOMY    . FOOT SURGERY    . RETINAL DETACHMENT SURGERY    . TONSILLECTOMY     Family History  Problem Relation Age of Onset  . Heart disease Other   . Breast cancer Other    Allergies as of 09/04/2018   No Known Allergies  Medication List       Accurate as of September 04, 2018 10:24 AM. Always use your most recent med list.        acetaminophen 500 MG tablet Commonly known as:  TYLENOL Take 500 mg by mouth every 6 (six) hours as needed.   aspirin 81 MG tablet Take 81 mg by mouth daily.   atorvastatin 40 MG tablet Commonly known as:  LIPITOR Take 1 tablet (40 mg total) by mouth daily.   b complex vitamins tablet Take 1 tablet by mouth daily.   ferrous sulfate 324 (65 Fe) MG Tbec Take 1 tablet (325 mg total) by mouth 2 (two) times daily with a meal.   metFORMIN 500 MG tablet Commonly known as:  GLUCOPHAGE Take 1 tablet (500 mg total) by mouth 2 (two) times daily with a meal.   metoprolol succinate 25 MG 24 hr tablet Commonly known as:  Toprol XL Take 0.5 tablets (12.5 mg total) by mouth 2 (two) times daily.    multivitamin tablet Take 1 tablet by mouth daily.   pantoprazole 40 MG tablet Commonly known as:  PROTONIX Take 1 tablet (40 mg total) by mouth daily.   tiZANidine 4 MG tablet Commonly known as:  ZANAFLEX TAKE ONE TABLET BY MOUTH AT BEDTIME FOR MUSCLE SPASM       All past medical history, surgical history, allergies, family history, immunizations andmedications were updated in the EMR today and reviewed under the history and medication portions of their EMR.     ROS: Negative, with the exception of above mentioned in HPI   Objective:  BP 120/64 (BP Location: Left Arm, Patient Position: Sitting, Cuff Size: Normal)   Pulse 87   Temp 98.2 F (36.8 C) (Oral)   Resp 16   Ht 5' (1.524 m)   Wt 132 lb 6 oz (60 kg)   SpO2 97%   BMI 25.85 kg/m  Body mass index is 25.85 kg/m. Gen: Afebrile. No acute distress. Nontoxic in appearance, well developed, well nourished.  HENT: AT. [place]. MMM Eyes:Pupils Equal Round Reactive to light, Extraocular movements intact,  Conjunctiva without redness, discharge or icterus. CV: RRR Chest: CTAB, no wheeze or crackles. Good air movement, normal resp effort.  MSK: normal ROM of thoracic and lumbar spine.   Neuro: Normal gait. PERLA. EOMi. Alert. Oriented x3  No exam data present No results found. No results found for this or any previous visit (from the past 24 hour(s)).  Assessment/Plan: Melissa [name] is a 84 y.o. female present for OV for  Iron deficiency anemia, unspecified iron deficiency anemia type continue iron supplement BID.  Complete FOBt - CBC w/Diff; Future - Iron, TIBC and Ferritin Panel; Future - lab appt 4 weeks.   Chronic bilateral thoracic/lumbar back pain - Much improved -History of lumbar degenerative disc disease with acute flare after gardening. - IM depo medrol injection to day for acute flare.  - Avoid NSAIDS with IDA of unknown cause/source.  - OTC capsaicin cream  - pt no longer needing controlled substance.  -  PRN   Reviewed expectations re: course of current medical issues.  Discussed self-management of symptoms.  Outlined signs and symptoms indicating need for more acute intervention.  Patient verbalized understanding and all questions were answered.  Patient received an After-Visit Summary.    Orders Placed This Encounter  Procedures  . CBC w/Diff  . Iron, TIBC and Ferritin Panel     Note is dictated utilizing voice recognition software. Although note has  been proof read prior to signing, occasional typographical errors still can be missed. If any questions arise, please do not hesitate to call for verification.   electronically signed by:  Howard Pouch, DO  [place]

## 2018-09-04 NOTE — Patient Instructions (Signed)
In 4 weeks come in for a lab appt only for retest on iron.  I am glad your back is better.   You have an appt in May for your chronic conditions.     Please help [place] help you:  We are honored you have chosen [facility] for your Primary Care home. Below you will find basic instructions that you may need to access in the future. Please help [place] help you by reading the instructions, which cover many of the frequent questions we experience.   Prescription refills and request:  -In order to allow more efficient response time, please call your pharmacy for all refills. They will forward the request electronically to [place]. This allows for the quickest possible response. Request left on a nurse line can take longer to refill, since these are checked as time allows between office patients and other phone calls.  - refill request can take up to 3-5 working days to complete.  - If request is sent electronically and request is appropiate, it is usually completed in 1-2 business days.  - all patients will need to be seen routinely for all chronic medical conditions requiring prescription medications (see follow-up below). If you are overdue for follow up on your condition, you will be asked to make an appointment and we will call in enough medication to cover you until your appointment (up to 30 days).  - all controlled substances will require a face to face visit to request/refill.  - if you desire your prescriptions to go through a new pharmacy, and have an active script at original pharmacy, you will need to call your pharmacy and have scripts transferred to new pharmacy. This is completed between the pharmacy locations and not by your provider.    Results: If any images or labs were ordered, it can take up to 1 week to get results depending on the test ordered and the lab/facility running and resulting the test. - Normal or stable results, which do not need further discussion, may be released to your  mychart immediately with attached note to you. A call may not be generated for normal results. Please make certain to sign up for mychart. If you have questions on how to activate your mychart you can call the front office.  - If your results need further discussion, our office will attempt to contact you via phone, and if unable to reach you after 2 attempts, we will release your abnormal result to your mychart with instructions.  - All results will be automatically released in mychart after 1 week.  - Your provider will provide you with explanation and instruction on all relevant material in your results. Please keep in mind, results and labs may appear confusing or abnormal to the untrained eye, but it does not mean they are actually abnormal for you personally. If you have any questions about your results that are not covered, or you desire more detailed explanation than what was provided, you should make an appointment with your provider to do so.   Our office handles many outgoing and incoming calls daily. If we have not contacted you within 1 week about your results, please check your mychart to see if there is a message first and if not, then contact our office.  In helping with this matter, you help decrease call volume, and therefore allow [place] to be able to respond to patients needs more efficiently.   Acute office visits (sick visit):  An acute visit  is intended for a new problem and are scheduled in shorter time slots to allow schedule openings for patients with new problems. This is the appropriate visit to discuss a new problem. Problems will not be addressed by phone call or Echart message. Appointment is needed if requesting treatment. In order to provide you with excellent quality medical care with proper time for you to explain your problem, have an exam and receive treatment with instructions, these appointments should be limited to one new problem per visit. If you experience a new  problem, in which you desire to be addressed, please make an acute office visit, we save openings on the schedule to accommodate you. Please do not save your new problem for any other type of visit, let us take care of it properly and quickly for you.   Follow up visits:  Depending on your condition(s) your provider will need to see you routinely in order to provide you with quality care and prescribe medication(s). Most chronic conditions (Example: hypertension, Diabetes, depression/anxiety... etc), require visits a couple times a year. Your provider will instruct you on proper follow up for your personal medical conditions and history. Please make certain to make follow up appointments for your condition as instructed. Failing to do so could result in lapse in your medication treatment/refills. If you request a refill, and are overdue to be seen on a condition, we will always provide you with a 30 day script (once) to allow you time to schedule.    Medicare wellness (well visit): - we have a wonderful Nurse Maudie Mercury), that will meet with you and provide you will yearly medicare wellness visits. These visits should occur yearly (can not be scheduled less than 1 calendar year apart) and cover preventive health, immunizations, advance directives and screenings you are entitled to yearly through your medicare benefits. Do not miss out on your entitled benefits, this is when medicare will pay for these benefits to be ordered for you.  These are strongly encouraged by your provider and is the appropriate type of visit to make certain you are up to date with all preventive health benefits. If you have not had your medicare wellness exam in the last 12 months, please make certain to schedule one by calling the office and schedule your medicare wellness with Maudie Mercury as soon as possible.   Yearly physical (well visit):  - Adults are recommended to be seen yearly for physicals. Check with your insurance and date of your  last physical, most insurances require one calendar year between physicals. Physicals include all preventive health topics, screenings, medical exam and labs that are appropriate for gender/age and history. You may have fasting labs needed at this visit. This is a well visit (not a sick visit), new problems should not be covered during this visit (see acute visit).  - Pediatric patients are seen more frequently when they are younger. Your provider will advise you on well child visit timing that is appropriate for your their age. - This is not a medicare wellness visit. Medicare wellness exams do not have an exam portion to the visit. Some medicare companies allow for a physical, some do not allow a yearly physical. If your medicare allows a yearly physical you can schedule the medicare wellness with our nurse Maudie Mercury and have your physical with your provider after, on the same day. Please check with insurance for your full benefits.   Late Policy/No Shows:  - all new patients should arrive 15-30 minutes earlier  than appointment to allow [place] time  to  obtain all personal demographics,  insurance information and for you to complete office paperwork. - All established patients should arrive 10-15 minutes earlier than appointment time to update all information and be checked in .  - In our best efforts to run on time, if you are late for your appointment you will be asked to either reschedule or if able, we will work you back into the schedule. There will be a wait time to work you back in the schedule,  depending on availability.  - If you are unable to make it to your appointment as scheduled, please call 24 hours ahead of time to allow [place] to fill the time slot with someone else who needs to be seen. If you do not cancel your appointment ahead of time, you may be charged a no show fee.

## 2018-09-10 ENCOUNTER — Other Ambulatory Visit: Payer: Medicare Other

## 2018-09-10 DIAGNOSIS — D509 Iron deficiency anemia, unspecified: Secondary | ICD-10-CM

## 2018-09-10 LAB — HEMOCCULT SLIDES (X 3 CARDS)
Fecal Occult Blood: NEGATIVE
OCCULT 1: NEGATIVE
OCCULT 2: NEGATIVE
OCCULT 3: NEGATIVE
OCCULT 4: NEGATIVE
OCCULT 5: NEGATIVE

## 2018-10-03 ENCOUNTER — Other Ambulatory Visit (INDEPENDENT_AMBULATORY_CARE_PROVIDER_SITE_OTHER): Payer: Medicare Other

## 2018-10-03 ENCOUNTER — Ambulatory Visit: Payer: Medicare Other | Admitting: Family Medicine

## 2018-10-03 DIAGNOSIS — D509 Iron deficiency anemia, unspecified: Secondary | ICD-10-CM

## 2018-10-03 LAB — CBC WITH DIFFERENTIAL/PLATELET
Basophils Absolute: 0 10*3/uL (ref 0.0–0.1)
Basophils Relative: 0.6 % (ref 0.0–3.0)
Eosinophils Absolute: 0.1 10*3/uL (ref 0.0–0.7)
Eosinophils Relative: 2.8 % (ref 0.0–5.0)
HCT: 42.1 % (ref 36.0–46.0)
Hemoglobin: 13.9 g/dL (ref 12.0–15.0)
Lymphocytes Relative: 25.7 % (ref 12.0–46.0)
Lymphs Abs: 1.3 10*3/uL (ref 0.7–4.0)
MCHC: 33 g/dL (ref 30.0–36.0)
MCV: 90.1 fl (ref 78.0–100.0)
Monocytes Absolute: 0.4 10*3/uL (ref 0.1–1.0)
Monocytes Relative: 8.5 % (ref 3.0–12.0)
Neutro Abs: 3.2 10*3/uL (ref 1.4–7.7)
Neutrophils Relative %: 62.4 % (ref 43.0–77.0)
Platelets: 346 10*3/uL (ref 150.0–400.0)
RBC: 4.67 Mil/uL (ref 3.87–5.11)
RDW: 19.9 % — ABNORMAL HIGH (ref 11.5–15.5)
WBC: 5.1 10*3/uL (ref 4.0–10.5)

## 2018-10-04 ENCOUNTER — Other Ambulatory Visit: Payer: Self-pay | Admitting: Family Medicine

## 2018-10-04 LAB — IRON,TIBC AND FERRITIN PANEL
%SAT: 21 % (calc) (ref 16–45)
Ferritin: 21 ng/mL (ref 16–288)
Iron: 71 ug/dL (ref 45–160)
TIBC: 340 mcg/dL (calc) (ref 250–450)

## 2018-10-04 MED ORDER — FERROUS SULFATE 324 (65 FE) MG PO TBEC: 1.0000 | DELAYED_RELEASE_TABLET | Freq: Every day | ORAL | 3 refills | Status: DC

## 2018-10-05 ENCOUNTER — Other Ambulatory Visit: Payer: Self-pay

## 2018-10-05 ENCOUNTER — Ambulatory Visit: Payer: Medicare Other | Admitting: Family Medicine

## 2018-10-31 ENCOUNTER — Ambulatory Visit: Payer: Medicare Other | Admitting: Family Medicine

## 2018-10-31 ENCOUNTER — Telehealth: Payer: Self-pay | Admitting: Family Medicine

## 2018-10-31 DIAGNOSIS — K146 Glossodynia: Secondary | ICD-10-CM

## 2018-10-31 NOTE — Telephone Encounter (Signed)
Patient understands she will be set up with labs after her next visit scheduled for Tuesday the 19th via telephone.  In the interim, she would like to know if its possible for her to stop taking the Zanaflex that was prescribed and go back to taking the gabapentin. States the Zanaflex makes her mouth very dry and she rests better on the gabapentin. If it is OK for her please call to advise and she states she will need an additional prescription of the gabapentin sent to the pharmacy.

## 2018-10-31 NOTE — Telephone Encounter (Signed)
Pt was scheduled for today, multiple attempts to reach patient with no call back from patient, appt was cancelled. Pt showed up today for appt and she rescheduled for Tuesday the 19th.   Please advise.

## 2018-11-01 MED ORDER — GABAPENTIN 300 MG PO CAPS: 300.0000 mg | ORAL_CAPSULE | Freq: Every day | ORAL | 0 refills | Status: DC

## 2018-11-01 NOTE — Telephone Encounter (Signed)
Pt was called and message was left letting pt know medication was sent to pharmacy and reminding pt of appt.

## 2018-11-01 NOTE — Telephone Encounter (Signed)
Called in gabapentin for her to use instead of Zanaflex.

## 2018-11-06 ENCOUNTER — Encounter: Payer: Self-pay | Admitting: Family Medicine

## 2018-11-06 ENCOUNTER — Other Ambulatory Visit: Payer: Self-pay

## 2018-11-06 ENCOUNTER — Ambulatory Visit (INDEPENDENT_AMBULATORY_CARE_PROVIDER_SITE_OTHER): Payer: Medicare Other | Admitting: Family Medicine

## 2018-11-06 ENCOUNTER — Ambulatory Visit: Payer: Medicare Other | Admitting: Family Medicine

## 2018-11-06 VITALS — Ht 60.0 in

## 2018-11-06 DIAGNOSIS — M545 Low back pain, unspecified: Secondary | ICD-10-CM

## 2018-11-06 DIAGNOSIS — D509 Iron deficiency anemia, unspecified: Secondary | ICD-10-CM

## 2018-11-06 DIAGNOSIS — E1165 Type 2 diabetes mellitus with hyperglycemia: Secondary | ICD-10-CM

## 2018-11-06 DIAGNOSIS — E113219 Type 2 diabetes mellitus with mild nonproliferative diabetic retinopathy with macular edema, unspecified eye: Secondary | ICD-10-CM | POA: Diagnosis not present

## 2018-11-06 DIAGNOSIS — K219 Gastro-esophageal reflux disease without esophagitis: Secondary | ICD-10-CM

## 2018-11-06 DIAGNOSIS — E11638 Type 2 diabetes mellitus with other oral complications: Secondary | ICD-10-CM | POA: Diagnosis not present

## 2018-11-06 DIAGNOSIS — G8929 Other chronic pain: Secondary | ICD-10-CM

## 2018-11-06 DIAGNOSIS — IMO0002 Reserved for concepts with insufficient information to code with codable children: Secondary | ICD-10-CM

## 2018-11-06 DIAGNOSIS — E785 Hyperlipidemia, unspecified: Secondary | ICD-10-CM

## 2018-11-06 DIAGNOSIS — E1169 Type 2 diabetes mellitus with other specified complication: Secondary | ICD-10-CM

## 2018-11-06 DIAGNOSIS — R002 Palpitations: Secondary | ICD-10-CM

## 2018-11-06 DIAGNOSIS — I739 Peripheral vascular disease, unspecified: Secondary | ICD-10-CM

## 2018-11-06 MED ORDER — METOPROLOL SUCCINATE ER 25 MG PO TB24: 12.5000 mg | ORAL_TABLET | Freq: Every day | ORAL | 1 refills | Status: DC

## 2018-11-06 MED ORDER — TIZANIDINE HCL 4 MG PO TABS: ORAL_TABLET | ORAL | 1 refills | Status: DC

## 2018-11-06 MED ORDER — GABAPENTIN 300 MG PO CAPS: 300.0000 mg | ORAL_CAPSULE | Freq: Every day | ORAL | 1 refills | Status: DC

## 2018-11-06 MED ORDER — METFORMIN HCL 500 MG PO TABS: 500.0000 mg | ORAL_TABLET | Freq: Two times a day (BID) | ORAL | 1 refills | Status: DC

## 2018-11-06 NOTE — Progress Notes (Addendum)
VIRTUAL VISIT VIA telephone  I connected with Laural [name] on 11/06/18 at  3:00 PM EDT by a telemedicine application and verified that I am speaking with the correct person using two identifiers. Location patient: Home Location provider: [facility], Office Persons participating in the virtual visit: Patient, Dr. Raoul Pitch and R.Baker, LPN  I discussed the limitations of evaluation and management by telemedicine and the availability of in person appointments. The patient expressed understanding and agreed to proceed.   SUBJECTIVE Chief Complaint  Patient presents with  . Diabetes    No complaints or concerns.   . Hyperlipidemia    HPI:  Diabetes/macular degeneration/nonproliferative retinopathy:  Pt reports compliance with metformin 500 mg twice daily (was prescribed metformin 500/1000). Patient denies dizziness, hyperglycemic or hypoglycemic events. Patient denies numbness, tingling in the extremities or nonhealing wounds of feet.  She is prescribed gabapentin 300 mg nightly for Thoracic back pain.  PNA series: Completed 2019 Flu shot: Completed 03/2018 (recommneded yearly) BMP: 07/03/2018 GFR 57 Foot exam: Completed 01/03/2018 microalbumin: Completed 01/03/2018 Eye exam: Dr. Katy [name], routine eye exams with macular degeneration. UTD.  A1c: 6.5>> ordered today  Palpitations/hyperlipidemia/peripheral artery disease: Patient reports she was having symptoms of fluttering in her chest.  She has seen a cardiologist to started her on metoprolol 12.5 mg twice daily.  She reports she is only been taking her metoprolol once a day. She denies any palpitations on lower dose.  She denies any anxiety.  She states she does eat quite a bit of chocolate, but does not admit to other caffeine or stimulant use.  He is compliant with baby aspirin and Lipitor 40 mg daily. Echo Doppler study on October 03, 2017 showed an EF of 65 to 70%. There was severe mitral annular calcification with very mild  functional MS by valve area  GERD: Stable. She does have a history of GERD.  She reports she is taking Protonix 40 mg daily.  She has been on this for quite some time and takes it daily. Without medication her symptoms return.   IDA: She asks about her IDA today. Labs resulted 09/2018 with well supplemented iron- she is continuing supplement QD.   ROS: See pertinent positives and negatives per HPI.  Patient Active Problem List   Diagnosis Date Noted  . Chronic right-sided low back pain without sciatica 08/03/2018  . Chronic bilateral thoracic back pain 08/03/2018  . Iron deficiency anemia 07/04/2018  . Palpitations 07/04/2018  . Peripheral arterial disease ([place]) 07/11/2017  . Varicose veins of both lower extremities 08/31/2016  . Back pain, thoracic 11/11/2015  . Burning tongue syndrome 11/11/2015  . Macular degeneration 05/05/2015  . Type 2 DM mild nonproliferative retinopathy, macular edema, uncontrol ([place]) 10/02/2013  . Diabetes mellitus type 2, controlled ([place]) 03/24/2009  . Hyperlipidemia associated with type 2 diabetes mellitus ([place]) 03/24/2009  . GERD 03/24/2009    Social History   Tobacco Use  . Smoking status: Never Smoker  . Smokeless tobacco: Never Used  Substance Use Topics  . Alcohol use: No    Current Outpatient Medications:  .  acetaminophen (TYLENOL) 500 MG tablet, Take 500 mg by mouth every 6 (six) hours as needed., Disp: , Rfl:  .  aspirin 81 MG tablet, Take 81 mg by mouth daily.  , Disp: , Rfl:  .  atorvastatin (LIPITOR) 40 MG tablet, Take 1 tablet (40 mg total) by mouth daily., Disp: 90 tablet, Rfl: 3 .  b complex vitamins tablet, Take 1 tablet by  mouth daily.  , Disp: , Rfl:  .  ferrous sulfate 324 (65 Fe) MG TBEC, Take 1 tablet (325 mg total) by mouth daily., Disp: 90 tablet, Rfl: 3 .  gabapentin (NEURONTIN) 300 MG capsule, Take 1 capsule (300 mg total) by mouth at bedtime., Disp: 90 capsule, Rfl: 0 .  metFORMIN (GLUCOPHAGE) 500 MG tablet, Take 1  tablet (500 mg total) by mouth 2 (two) times daily with a meal., Disp: 180 tablet, Rfl: 5 .  metoprolol succinate (TOPROL XL) 25 MG 24 hr tablet, Take 0.5 tablets (12.5 mg total) by mouth 2 (two) times daily. (Patient taking differently: Take 12.5 mg by mouth. 1 tablet once daily), Disp: 90 tablet, Rfl: 3 .  Multiple Vitamin (MULTIVITAMIN) tablet, Take 1 tablet by mouth daily.  , Disp: , Rfl:  .  pantoprazole (PROTONIX) 40 MG tablet, Take 1 tablet (40 mg total) by mouth daily., Disp: 90 tablet, Rfl: 3 .  tiZANidine (ZANAFLEX) 4 MG tablet, TAKE ONE TABLET BY MOUTH AT BEDTIME FOR MUSCLE SPASM, Disp: 90 tablet, Rfl: 1  No Known Allergies  OBJECTIVE: Ht 5' (1.524 m)   BMI 25.85 kg/m  Gen: No acute distress. Nontoxic in appearance.  HENT: AT. [place].  MMM.  Eyes:Pupils Equal Round Reactive to light, Extraocular movements intact,  Conjunctiva without redness, discharge or icterus. CV: no edema Chest: Cough or shortness of breath. Skin: No rashes, purpura or petechiae.  Neuro: Normal gait. Alert. Oriented x3  Psych: Normal affect, dress and demeanor. Normal speech. Normal thought content and judgment.  ASSESSMENT AND PLAN: Melissa [name] is a 84 y.o. female present for  Controlled type 2 diabetes mellitus (HCC)/Type 2 DM mild nonproliferative retinopathy, macular edema, uncontrol (HCC) Continue 500 mg metformin twice daily. PNA series: Completed 2019 Flu shot: Completed 03/2018 (recommneded yearly) BMP: 07/03/2018 GFR 57 Foot exam: Completed 01/03/2018 microalbumin: Completed 01/03/2018 Eye exam: Dr. Katy [name], routine eye exams with macular degeneration. UTD.  A1c: 6.5>> ordered today- she will be called with results.  - metFORMIN (GLUCOPHAGE) 500 MG tablet; Take 1 tablet (500 mg total) by mouth 2 (two) times daily with a meal.  Dispense: 180 tablet; Refill: 5 Follow-up 3-4 months.  Hyperlipidemia associated with type 2 diabetes mellitus (HCC)/Peripheral arterial disease (HCC)/Palpitations/IDA  -Continue to  avoid caffeine, stimulants-her vice is excess chocolate. - Continue iron QD for IDA.    -  Currently she is not taking the metoprolol as prescribed and her heart rate is elevated, she could use a full dose of the 25 mg metoprolol>> she did not increase to 1 tab and stayed at 12.5 mg. Refills provided. - Lipid 12/08/2017 look great.  Continue atorvastatin 40 mg daily. -Continue baby aspirin daily. - Comp Met (CMET) - CBC   Gastroesophageal reflux disease without esophagitis - Stable. controlled with Protonix.  - Takes B-complex.  - follow b12, mag and vit d q 3 years   Return in about 4 months (around 11/01/2018) for DM/HLD.  > 25 minutes spent with patient, >50% of time spent face to face counseling and/or coordinating care.     Howard Pouch, DO 11/06/2018

## 2018-11-26 ENCOUNTER — Telehealth: Payer: Self-pay | Admitting: Family Medicine

## 2018-11-26 NOTE — Telephone Encounter (Signed)
Copied from [id number]. Topic: Quick Communication - See Telephone Encounter >> Nov 26, 2018  8:46 AM Robina Ade, Helene Kelp D wrote: CRM for notification. See Telephone encounter for: 11/26/18. Patient called and would like to know if Dr. Raoul Pitch can order her a urinalysis due to she thinks she has a UTI. She has an appt on Wednesday. Please call patient with any questions.

## 2018-11-26 NOTE — Telephone Encounter (Signed)
Pt was called and stated she is having some pressure and is having frequency. Sx come and go and have been happening for some time.  Denies fever, chills, or blood in urine. Pt stated she would have to wait until Wed for appt, appt was made.

## 2018-11-26 NOTE — Telephone Encounter (Signed)
Please advise if pt should move appt up to tomorrow or can come to drop off urine Thanks

## 2018-11-26 NOTE — Telephone Encounter (Signed)
Is her appt on Wed for the UTI or other issues? If  Nto, she needs to be seen for uti. Can be seen today- in office if appts available.

## 2018-11-27 ENCOUNTER — Other Ambulatory Visit: Payer: Self-pay | Admitting: Family Medicine

## 2018-11-27 DIAGNOSIS — E7800 Pure hypercholesterolemia, unspecified: Secondary | ICD-10-CM

## 2018-11-28 ENCOUNTER — Encounter: Payer: Self-pay | Admitting: Family Medicine

## 2018-11-28 ENCOUNTER — Ambulatory Visit: Payer: Medicare Other | Admitting: Family Medicine

## 2018-11-28 ENCOUNTER — Ambulatory Visit: Payer: Medicare Other

## 2018-11-28 ENCOUNTER — Other Ambulatory Visit: Payer: Self-pay

## 2018-11-28 VITALS — BP 112/72 | HR 93 | Temp 98.1°F | Resp 14 | Ht 60.0 in | Wt 130.0 lb

## 2018-11-28 DIAGNOSIS — W57XXXA Bitten or stung by nonvenomous insect and other nonvenomous arthropods, initial encounter: Secondary | ICD-10-CM

## 2018-11-28 DIAGNOSIS — E785 Hyperlipidemia, unspecified: Secondary | ICD-10-CM | POA: Diagnosis not present

## 2018-11-28 DIAGNOSIS — E1169 Type 2 diabetes mellitus with other specified complication: Secondary | ICD-10-CM

## 2018-11-28 DIAGNOSIS — S30860A Insect bite (nonvenomous) of lower back and pelvis, initial encounter: Secondary | ICD-10-CM

## 2018-11-28 DIAGNOSIS — R350 Frequency of micturition: Secondary | ICD-10-CM

## 2018-11-28 DIAGNOSIS — E11638 Type 2 diabetes mellitus with other oral complications: Secondary | ICD-10-CM | POA: Diagnosis not present

## 2018-11-28 DIAGNOSIS — R8290 Unspecified abnormal findings in urine: Secondary | ICD-10-CM | POA: Diagnosis not present

## 2018-11-28 LAB — POC URINALSYSI DIPSTICK (AUTOMATED)
Bilirubin, UA: 1
Blood, UA: NEGATIVE
Glucose, UA: NEGATIVE
Ketones, UA: NEGATIVE
Nitrite, UA: NEGATIVE
Protein, UA: NEGATIVE
Spec Grav, UA: >=1.03 — AB (ref 1.010–1.025)
Urobilinogen, UA: NEGATIVE E.U./dL — AB
pH, UA: 5 (ref 5.0–8.0)

## 2018-11-28 LAB — LIPID PANEL
Cholesterol: 166 mg/dL (ref 0–200)
HDL: 66.5 mg/dL (ref >39.00)
LDL Cholesterol: 74 mg/dL (ref 0–99)
NonHDL: 99.3
Total CHOL/HDL Ratio: 2
Triglycerides: 125 mg/dL (ref 0.0–149.0)
VLDL: 25 mg/dL (ref 0.0–40.0)

## 2018-11-28 LAB — HEMOGLOBIN A1C: Hgb A1c MFr Bld: 7.3 % — ABNORMAL HIGH (ref 4.6–6.5)

## 2018-11-28 MED ORDER — CEPHALEXIN 500 MG PO CAPS: 500.0000 mg | ORAL_CAPSULE | Freq: Four times a day (QID) | ORAL | 0 refills | Status: DC

## 2018-11-28 MED ORDER — DOXYCYCLINE HYCLATE 100 MG PO TABS: 200.0000 mg | ORAL_TABLET | Freq: Once | ORAL | 0 refills | Status: AC

## 2018-11-28 NOTE — Patient Instructions (Addendum)
If symptoms are not bad, you can wait until we get the urine culture results before picking up antibiotic.  If want to start the antibiotic today you can also.  Hydrate.   Take the doxycyline 2 pills together today with food for tick bite.    Interstitial Cystitis  Interstitial cystitis is inflammation of the bladder. This may cause pain in the bladder area as well as a frequent and urgent need to urinate. The bladder is a hollow organ in the lower part of the abdomen. It stores urine after the urine is made in the kidneys. The severity of interstitial cystitis can vary from person to person. You may have flare-ups, and then your symptoms may go away for a while. For many people, it becomes a long-term (chronic) problem. What are the causes? The cause of this condition is not known. What increases the risk? The following factors may make you more likely to develop this condition:  You are female.  You have fibromyalgia.  You have irritable bowel syndrome (IBS).  You have endometriosis. This condition may be aggravated by:  Stress.  Smoking.  Spicy foods. What are the signs or symptoms? Symptoms of interstitial cystitis vary, and they can change over time. Symptoms may include:  Discomfort or pain in the bladder area, which is in the lower abdomen. Pain can range from mild to severe. The pain may change in intensity as the bladder fills with urine or as it empties.  Pain in the pelvic area, between the hip bones.  An urgent need to urinate.  Frequent urination.  Pain during urination.  Pain during sex.  Blood in the urine. For women, symptoms often get worse during menstruation. How is this diagnosed? This condition is diagnosed based on your symptoms, your medical history, and a physical exam. You may have tests to rule out other conditions, such as:  Urine tests.  Cystoscopy. For this test, a tool similar to a very thin telescope is used to look into your bladder.   Biopsy. This involves taking a sample of tissue from the bladder to be examined under a microscope. How is this treated? There is no cure for this condition, but treatment can help you control your symptoms. Work closely with your health care provider to find the most effective treatments for you. Treatment options may include:  Medicines to relieve pain and reduce how often you feel the need to urinate.  Learning ways to control when you urinate (bladder training).  Lifestyle changes, such as changing your diet or taking steps to control stress.  Using a device that provides electrical stimulation to your nerves, which can relieve pain (neuromodulation therapy). The device is placed on your back, where it blocks the nerves that cause you to feel pain in your bladder area.  A procedure that stretches your bladder by filling it with air or fluid.  Surgery. This is rare. It is only done for extreme cases, if other treatments do not help. Follow these instructions at home: Bladder training   Use bladder training techniques as directed. Techniques may include: ? Urinating at scheduled times. ? Training yourself to delay urination. ? Doing exercises (Kegel exercises) to strengthen the muscles that control urine flow.  Keep a bladder diary. ? Write down the times that you urinate and any symptoms that you have. This can help you find out which foods, liquids, or activities make your symptoms worse. ? Use your bladder diary to schedule bathroom trips. If you are  away from home, plan to be near a bathroom at each of your scheduled times.  Make sure that you urinate just before you leave the house and just before you go to bed. Eating and drinking  Make dietary changes as recommended by your health care provider. You may need to avoid: ? Spicy foods. ? Foods that contain a lot of potassium.  Limit your intake of beverages that make you need to urinate. These include: ? Caffeinated  beverages like soda, coffee, and tea. ? Alcohol. General instructions  Take over-the-counter and prescription medicines only as told by your health care provider.  Do not drink alcohol.  You can try a warm or cool compress over your bladder for comfort.  Avoid wearing tight clothing.  Do not use any products that contain nicotine or tobacco, such as cigarettes and e-cigarettes. If you need help quitting, ask your health care provider.  Keep all follow-up visits as told by your health care provider. This is important. Contact a health care provider if you have:  Symptoms that do not get better with treatment.  Pain or discomfort that gets worse.  More frequent urges to urinate.  A fever. Get help right away if:  You have no control over when you urinate. Summary  Interstitial cystitis is inflammation of the bladder.  This condition may cause pain in the bladder area as well as a frequent and urgent need to urinate.  You may have flare-ups of the condition, and then it may go away for a while. For many people, it becomes a long-term (chronic) problem.  There is no cure for interstitial cystitis, but treatment methods are available to control your symptoms. This information is not intended to replace advice given to you by your health care provider. Make sure you discuss any questions you have with your health care provider. Document Released: 02/05/2004 Document Revised: 05/01/2017 Document Reviewed: 05/01/2017 Elsevier Interactive Patient Education  2019 [employer].

## 2018-11-28 NOTE — Progress Notes (Signed)
Melissa [name] , 1935/05/05, 84 y.o., female MRN: [id number] Patient Care Team    Relationship Specialty Notifications Start End  [name], DO PCP - General Family Medicine  07/03/18   Zadie Rhine Clent Demark, MD Consulting Physician Ophthalmology  07/03/18   [facility]  Ophthalmology  07/03/18   Emily Filbert, MD Consulting Physician Obstetrics and Gynecology  07/04/18   Troy Sine, MD Consulting Physician Cardiology  07/04/18     Chief Complaint  Patient presents with  . Urinary Frequency    pressure/  been going on about a week.     Subjective: Pt presents for an OV with complaints of increased bladder pressure and urinary frequency of 1 week duration.  She denies fever, chills, nausea, vomit, hematuria or low back pain.  She has a history of bladder prolapse. Patient also endorses having a tick bite on her upper back.  She states she does not know when the tick would have had a chance to get on her because she has not been out in her yard been for the last few days since the weekend.  She felt an itchiness on her back and asked her neighbor to look at her back in which they found a tick and it was removed this morning.  She denies any fever, headache, arthralgias or rash.  Depression screen [facility] 2/9 07/04/2018 01/03/2018 09/05/2017 08/31/2016 07/26/2013  Decreased Interest 0 0 0 0 0  Down, Depressed, Hopeless 0 0 0 0 0  PHQ - 2 Score 0 0 0 0 0    No Known Allergies Social History   Social History Narrative   Marital status/children/pets: Widowed.   Education/employment: 12th grade education.  Retired.   Safety:      -smoke alarm in the home:Yes     - wears seatbelt: Yes     - Feels safe in their relationships: Yes   Past Medical History:  Diagnosis Date  . Burning tongue syndrome 11/11/2015  . Colon polyps   . Cystocele with prolapse 2019   Dr. Hulan Fray- Monitor for now.   . Detached retina, right   . Diabetes mellitus   . Diaphragmatic hernia   . GERD  (gastroesophageal reflux disease)   . Hyperlipidemia   . Insomnia   . Macular degeneration    Past Surgical History:  Procedure Laterality Date  . ABDOMINAL HYSTERECTOMY    . FOOT SURGERY    . RETINAL DETACHMENT SURGERY    . TONSILLECTOMY     Family History  Problem Relation Age of Onset  . Heart disease Other   . Breast cancer Other    Allergies as of 11/28/2018   No Known Allergies     Medication List       Accurate as of November 28, 2018  6:18 PM. If you have any questions, ask your nurse or doctor.        acetaminophen 500 MG tablet Commonly known as:  TYLENOL Take 500 mg by mouth every 6 (six) hours as needed.   aspirin 81 MG tablet Take 81 mg by mouth daily.   atorvastatin 40 MG tablet Commonly known as:  LIPITOR Take 1 tablet (40 mg total) by mouth daily.   b complex vitamins tablet Take 1 tablet by mouth daily.   cephALEXin 500 MG capsule Commonly known as:  KEFLEX Take 1 capsule (500 mg total) by mouth 4 (four) times daily. Started by:  Howard Pouch, DO   doxycycline 100  MG tablet Commonly known as:  VIBRA-TABS Take 2 tablets (200 mg total) by mouth once for 1 dose. Started by:  Felix Pacinienee , DO   ferrous sulfate 324 (65 Fe) MG Tbec Take 1 tablet (325 mg total) by mouth daily.   gabapentin 300 MG capsule Commonly known as:  NEURONTIN Take 1 capsule (300 mg total) by mouth at bedtime.   metFORMIN 500 MG tablet Commonly known as:  GLUCOPHAGE Take 1 tablet (500 mg total) by mouth 2 (two) times daily with a meal.   metoprolol succinate 25 MG 24 hr tablet Commonly known as:  Toprol XL Take 0.5 tablets (12.5 mg total) by mouth daily. 1 tablet once daily   multivitamin tablet Take 1 tablet by mouth daily.   pantoprazole 40 MG tablet Commonly known as:  PROTONIX Take 1 tablet (40 mg total) by mouth daily.   tiZANidine 4 MG tablet Commonly known as:  ZANAFLEX TAKE ONE TABLET BY MOUTH AT BEDTIME FOR MUSCLE SPASM       All past medical  history, surgical history, allergies, family history, immunizations andmedications were updated in the EMR today and reviewed under the history and medication portions of their EMR.     ROS: Negative, with the exception of above mentioned in HPI   Objective:  BP 112/72   Pulse 93   Temp 98.1 F (36.7 C)   Resp 14   Ht 5' (1.524 m)   Wt 130 lb (59 kg)   SpO2 96%   BMI 25.39 kg/m  Body mass index is 25.39 kg/m. Gen: Afebrile. No acute distress. Nontoxic in appearance, well developed, well nourished.  HENT: AT. [place].  MMM Eyes:Pupils Equal Round Reactive to light, Extraocular movements intact,  Conjunctiva without redness, discharge or icterus. Abd: Soft. NTND. BS present.  Skin: Upper left thoracic area with insect bite-mild to moderate red local reaction to bite.  No other rash MSK: No CVA tenderness Neuro:  Normal gait. PERLA. EOMi. Alert. Oriented x3  No exam data present No results found. Results for orders placed or performed in visit on 11/28/18 (from the past 24 hour(s))  Lipid panel     Status: None   Collection Time: 11/28/18 10:07 AM  Result Value Ref Range   Cholesterol 166 0 - 200 mg/dL   Triglycerides 161.0125.0 0.0 - 149.0 mg/dL   HDL 96.0466.50 >54.09>39.00 mg/dL   VLDL 81.125.0 0.0 - 91.440.0 mg/dL   LDL Cholesterol 74 0 - 99 mg/dL   Total CHOL/HDL Ratio 2    NonHDL 99.30   Hemoglobin A1c     Status: Abnormal   Collection Time: 11/28/18 10:07 AM  Result Value Ref Range   Hgb A1c MFr Bld 7.3 (H) 4.6 - 6.5 %  POCT Urinalysis Dipstick (Automated)     Status: Abnormal   Collection Time: 11/28/18 10:46 AM  Result Value Ref Range   Color, UA yellow    Clarity, UA clear    Glucose, UA Negative Negative   Bilirubin, UA 1    Ketones, UA negative    Spec Grav, UA >=1.030 (A) 1.010 - 1.025   Blood, UA negative    pH, UA 5.0 5.0 - 8.0   Protein, UA Negative Negative   Urobilinogen, UA negative (A) 0.2 or 1.0 E.U./dL   Nitrite, UA neg    Leukocytes, UA Small (1+) (A) Negative     Assessment/Plan: Melissa LowersMartha N [name] is a 84 y.o. female present for OV for  Urinary frequency/abnormal urine: -Leukocytes in point-of-care urine.  Urine sent for culture today. -We will start Keflex prophylaxis, which she can elect to start tomorrow or wait until urine culture results depending upon her symptoms.  She understands plan and is agreeable today.  Reviewed expectations re: course of current medical issues.  Discussed self-management of symptoms.  Outlined signs and symptoms indicating need for more acute intervention.  Patient verbalized understanding and all questions were answered.  Patient received an After-Visit Summary.  Tick bite of back initial encounter: -From history it sounds as the tick may have been in place greater than 48 hours.  Her friend remove the tick this morning.  We will start doxycycline 200 mg once for prophylaxis.  She will monitor for any fevers, headaches, myalgias/arthralgias or rash.  Orders Placed This Encounter  Procedures  . Urine Culture  . POCT Urinalysis Dipstick (Automated)    > 25 minutes spent with patient, >50% of time spent face to face    Note is dictated utilizing voice recognition software. Although note has been proof read prior to signing, occasional typographical errors still can be missed. If any questions arise, please do not hesitate to call for verification.   electronically signed by:  Felix Pacinienee , DO  [facility] [place]

## 2018-11-28 NOTE — Progress Notes (Signed)
Pre visit review using our clinic review tool, if applicable. No additional management support is needed unless otherwise documented below in the visit note. 

## 2018-11-29 ENCOUNTER — Telehealth: Payer: Self-pay | Admitting: Family Medicine

## 2018-11-29 NOTE — Telephone Encounter (Signed)
Please inform patient the following information: Her a1c has increased for 6.5 to 7.3. Recommend she increase her metformin to 500 mg ( 1 tab) morning and 1000 mg (2 tab) evening dose. Continue routine exercise and avoid high sugar/carb meals and snacks.   - please schedule her f/u 3 mos of [facility]/DM

## 2018-11-29 NOTE — Telephone Encounter (Signed)
Pt was called and given lab results, she verbalized understanding. Appt was scheduled

## 2018-11-30 LAB — URINE CULTURE
MICRO NUMBER:: 555997
SPECIMEN QUALITY:: ADEQUATE

## 2019-01-11 ENCOUNTER — Other Ambulatory Visit: Payer: Self-pay | Admitting: Family Medicine

## 2019-01-11 DIAGNOSIS — E7800 Pure hypercholesterolemia, unspecified: Secondary | ICD-10-CM

## 2019-03-05 ENCOUNTER — Ambulatory Visit (INDEPENDENT_AMBULATORY_CARE_PROVIDER_SITE_OTHER): Payer: Medicare Other | Admitting: Family Medicine

## 2019-03-05 ENCOUNTER — Encounter: Payer: Self-pay | Admitting: Family Medicine

## 2019-03-05 ENCOUNTER — Other Ambulatory Visit: Payer: Self-pay

## 2019-03-05 VITALS — BP 130/77 | HR 83 | Temp 97.8°F | Resp 16 | Ht 60.0 in | Wt 134.2 lb

## 2019-03-05 DIAGNOSIS — E1169 Type 2 diabetes mellitus with other specified complication: Secondary | ICD-10-CM

## 2019-03-05 DIAGNOSIS — M545 Low back pain, unspecified: Secondary | ICD-10-CM

## 2019-03-05 DIAGNOSIS — I739 Peripheral vascular disease, unspecified: Secondary | ICD-10-CM

## 2019-03-05 DIAGNOSIS — R002 Palpitations: Secondary | ICD-10-CM

## 2019-03-05 DIAGNOSIS — N183 Chronic kidney disease, stage 3 unspecified: Secondary | ICD-10-CM

## 2019-03-05 DIAGNOSIS — E785 Hyperlipidemia, unspecified: Secondary | ICD-10-CM

## 2019-03-05 DIAGNOSIS — E11638 Type 2 diabetes mellitus with other oral complications: Secondary | ICD-10-CM

## 2019-03-05 DIAGNOSIS — D509 Iron deficiency anemia, unspecified: Secondary | ICD-10-CM | POA: Diagnosis not present

## 2019-03-05 DIAGNOSIS — K219 Gastro-esophageal reflux disease without esophagitis: Secondary | ICD-10-CM | POA: Diagnosis not present

## 2019-03-05 DIAGNOSIS — F419 Anxiety disorder, unspecified: Secondary | ICD-10-CM | POA: Insufficient documentation

## 2019-03-05 LAB — MICROALBUMIN / CREATININE URINE RATIO
Creatinine,U: 116.8 mg/dL
Microalb Creat Ratio: 2.4 mg/g (ref 0.0–30.0)
Microalb, Ur: 2.8 mg/dL — ABNORMAL HIGH (ref 0.0–1.9)

## 2019-03-05 LAB — BASIC METABOLIC PANEL
BUN: 18 mg/dL (ref 6–23)
CO2: 28 mEq/L (ref 19–32)
Calcium: 9.6 mg/dL (ref 8.4–10.5)
Chloride: 103 mEq/L (ref 96–112)
Creatinine, Ser: 0.87 mg/dL (ref 0.40–1.20)
GFR: 62.28 mL/min (ref >60.00)
Glucose, Bld: 98 mg/dL (ref 70–99)
Potassium: 4.4 mEq/L (ref 3.5–5.1)
Sodium: 140 mEq/L (ref 135–145)

## 2019-03-05 LAB — POCT GLYCOSYLATED HEMOGLOBIN (HGB A1C)
HbA1c POC (<> result, manual entry): 6.3 % (ref 4.0–5.6)
HbA1c, POC (controlled diabetic range): 6.3 % (ref 0.0–7.0)
HbA1c, POC (prediabetic range): 6.3 % (ref 5.7–6.4)
Hemoglobin A1C: 6.3 % — AB (ref 4.0–5.6)

## 2019-03-05 LAB — CBC
HCT: 41.1 % (ref 36.0–46.0)
Hemoglobin: 13.7 g/dL (ref 12.0–15.0)
MCHC: 33.3 g/dL (ref 30.0–36.0)
MCV: 94 fl (ref 78.0–100.0)
Platelets: 289 10*3/uL (ref 150.0–400.0)
RBC: 4.37 Mil/uL (ref 3.87–5.11)
RDW: 13.4 % (ref 11.5–15.5)
WBC: 5.8 10*3/uL (ref 4.0–10.5)

## 2019-03-05 LAB — VITAMIN D 25 HYDROXY (VIT D DEFICIENCY, FRACTURES): VITD: 30.21 ng/mL (ref 30.00–100.00)

## 2019-03-05 MED ORDER — METFORMIN HCL 500 MG PO TABS: ORAL_TABLET | ORAL | 1 refills | Status: DC

## 2019-03-05 MED ORDER — METOPROLOL SUCCINATE ER 25 MG PO TB24: 12.5000 mg | ORAL_TABLET | Freq: Every day | ORAL | 1 refills | Status: DC

## 2019-03-05 MED ORDER — TIZANIDINE HCL 4 MG PO TABS: ORAL_TABLET | ORAL | 1 refills | Status: DC

## 2019-03-05 MED ORDER — ESCITALOPRAM OXALATE 5 MG PO TABS: 5.0000 mg | ORAL_TABLET | Freq: Every day | ORAL | 1 refills | Status: DC

## 2019-03-05 MED ORDER — PANTOPRAZOLE SODIUM 40 MG PO TBEC: 40.0000 mg | DELAYED_RELEASE_TABLET | Freq: Every day | ORAL | 3 refills | Status: DC

## 2019-03-05 MED ORDER — GABAPENTIN 300 MG PO CAPS: 300.0000 mg | ORAL_CAPSULE | Freq: Every day | ORAL | 1 refills | Status: DC

## 2019-03-05 NOTE — Patient Instructions (Addendum)
Try using the biotene mouth rinse twice a day. I have called in all your medications.  Use the tizanidine for your jaw.  We are starting a low dose lexapro to help with the anxiety.    Your a1c went back down to 6.3> continue 1 pill in morning and 2 pills in the afternoon.   Follow up in 4 months. We will call with lab results.

## 2019-03-05 NOTE — Progress Notes (Signed)
SUBJECTIVE Chief Complaint  Patient presents with  . Diabetes    Pt is doing well with no complaints     HPI:  Diabetes/macular degeneration/nonproliferative retinopathy:  Pt reports compliance with metformin 500 mg in the morning and 1000 mg in the afternoon. Patient denies dizziness, hyperglycemic or hypoglycemic events. Patient denies numbness, tingling in the extremities or nonhealing wounds of feet.  She is prescribed gabapentin 300 mg nightly for Thoracic back pain.  PNA series: Completed 2019 Flu shot: Completed 03/2018- she is getting this years at pharmacy (recommneded yearly) BMP: 07/03/2018 Urine microal: collected todau Foot exam: completed today 03/05/2019 Eye exam: Dr. Dione Booze, routine eye exams with macular degeneration. UTD. 03/2018 A1c: 6.5>>7.3 >>6.3 today  Palpitations/hyperlipidemia/peripheral artery disease/anxiety: She has seen a cardiologist to started her on metoprolol 12.5 mg twice daily for her palpitations. She does endorse anxiety. She has not had any palpitations.   She states she does eat quite a bit of chocolate- but is tryin gto cut back.  She is compliant with baby aspirin and Lipitor 40 mg daily. Echo Doppler study on October 03, 2017 showed an EF of 65 to 70%. There was severe mitral annular calcification with very mild functional MS by valve area Lipid UTD 11/28/2018 CBC 10/03/2018 TSH 07/03/2018  GERD: She reports condition is stable. She again complains of burning mouth syndrome. She has seen many specilaist for this and even had bx completed of her tongue. . She does have a history of GERD.  She reports she is taking Protonix 40 mg daily.  She has been on this for quite some time and takes it daily. Without medication her symptoms return.   IDA: She reports compliance with Iron 65E Qd.  Labs resulted 09/2018 with well supplemented iron- she is continuing supplement QD.   Chronic thoracic back pain and new left jaw discomfort: She thinks she has TMJ.  Discmfort in left jaw, but this morning also in right jaw with chewing. She is prescribed gabapentin for thoracic pain and  zanaflex- which she rarely uses.   ROS: See pertinent positives and negatives per HPI.  Patient Active Problem List   Diagnosis Date Noted  . Chronic right-sided low back pain without sciatica 08/03/2018  . Chronic bilateral thoracic back pain 08/03/2018  . Iron deficiency anemia 07/04/2018  . Palpitations 07/04/2018  . Peripheral arterial disease (HCC) 07/11/2017  . Varicose veins of both lower extremities 08/31/2016  . Macular degeneration 05/05/2015  . Type 2 DM mild nonproliferative retinopathy, macular edema, uncontrol (HCC) 10/02/2013  . Diabetes mellitus type 2, controlled (HCC) 03/24/2009  . Hyperlipidemia associated with type 2 diabetes mellitus (HCC) 03/24/2009  . GERD 03/24/2009    Social History   Tobacco Use  . Smoking status: Never Smoker  . Smokeless tobacco: Never Used  Substance Use Topics  . Alcohol use: No    Current Outpatient Medications:  .  acetaminophen (TYLENOL) 500 MG tablet, Take 500 mg by mouth every 6 (six) hours as needed., Disp: , Rfl:  .  aspirin 81 MG tablet, Take 81 mg by mouth daily.  , Disp: , Rfl:  .  atorvastatin (LIPITOR) 40 MG tablet, Take 1 tablet (40 mg total) by mouth daily., Disp: 90 tablet, Rfl: 3 .  b complex vitamins tablet, Take 1 tablet by mouth daily.  , Disp: , Rfl:  .  cephALEXin (KEFLEX) 500 MG capsule, Take 1 capsule (500 mg total) by mouth 4 (four) times daily., Disp: 28 capsule, Rfl: 0 .  ferrous sulfate 324 (65 Fe) MG TBEC, Take 1 tablet (325 mg total) by mouth daily., Disp: 90 tablet, Rfl: 3 .  gabapentin (NEURONTIN) 300 MG capsule, Take 1 capsule (300 mg total) by mouth at bedtime., Disp: 90 capsule, Rfl: 1 .  metFORMIN (GLUCOPHAGE) 500 MG tablet, Take 1 tablet (500 mg total) by mouth 2 (two) times daily with a meal., Disp: 180 tablet, Rfl: 1 .  metoprolol succinate (TOPROL XL) 25 MG 24 hr tablet,  Take 0.5 tablets (12.5 mg total) by mouth daily. 1 tablet once daily, Disp: 90 tablet, Rfl: 1 .  Multiple Vitamin (MULTIVITAMIN) tablet, Take 1 tablet by mouth daily.  , Disp: , Rfl:  .  pantoprazole (PROTONIX) 40 MG tablet, Take 1 tablet (40 mg total) by mouth daily., Disp: 90 tablet, Rfl: 3 .  tiZANidine (ZANAFLEX) 4 MG tablet, TAKE ONE TABLET BY MOUTH AT BEDTIME FOR MUSCLE SPASM, Disp: 90 tablet, Rfl: 1  No Known Allergies  OBJECTIVE: BP 130/77 (BP Location: Left Arm, Patient Position: Sitting, Cuff Size: Normal)   Pulse 83   Temp 97.8 F (36.6 C) (Temporal)   Resp 16   Ht 5' (1.524 m)   Wt 134 lb 4 oz (60.9 kg)   SpO2 94%   BMI 26.22 kg/m  Gen: Afebrile. No acute distress. Pleasant talkative caucasian female. Mildly overweight.  HENT: AT. [place]. Mildly dry mucous membranes.  No tongue lesions, exudate or erythema.  No cough or shortness of breath present.   Eyes:Pupils Equal Round Reactive to light, Extraocular movements intact,  Conjunctiva without redness, discharge or icterus. Neck/lymp/endocrine: Supple, no lymphadenopathy, no thyromegaly CV: RRR no murmur, no edema, +2/4 P posterior tibialis pulses Chest: CTAB, no wheeze or crackles Abd: Soft. NTND. BS present.  MSK: Very mild left mandibular shift with full extension, no popping or cracking of jaw.  No tenderness to palpation TMJ. Neuro:  Normal gait. Alert. Oriented x3  Psych: Mildly anxious.  Normal affect, dress and demeanor. Normal speech. Normal thought content and judgment.  Diabetic Foot Exam - Simple   Simple Foot Form Diabetic Foot exam was performed with the following findings: Yes 03/05/2019 11:12 AM  Visual Inspection No deformities, no ulcerations, no other skin breakdown bilaterally: Yes Sensation Testing Intact to touch and monofilament testing bilaterally: Yes Pulse Check Posterior Tibialis and Dorsalis pulse intact bilaterally: Yes Comments      Results for orders placed or performed in visit on  03/05/19 (from the past 24 hour(s))  POCT glycosylated hemoglobin (Hb A1C)     Status: Abnormal   Collection Time: 03/05/19 10:40 AM  Result Value Ref Range   Hemoglobin A1C 6.3 (A) 4.0 - 5.6 %   HbA1c POC (<> result, manual entry) 6.3 4.0 - 5.6 %   HbA1c, POC (prediabetic range) 6.3 5.7 - 6.4 %   HbA1c, POC (controlled diabetic range) 6.3 0.0 - 7.0 %    ASSESSMENT AND PLAN: Melissa [name] is a 84 y.o. female present for  Controlled type 2 diabetes mellitus (HCC)/Type 2 DM mild nonproliferative retinopathy, macular edema, uncontrol (HCC) Better today. Continue metformin 1500 mg total a day- may divide.  PNA series: Completed 2019 Flu shot: Completed 03/2018- she is getting this years at pharmacy (recommneded yearly) BMP: 07/03/2018 Urine microal: collected todau Foot exam: completed today 03/05/2019 Eye exam: Dr. Dione BoozeGroat, routine eye exams with macular degeneration. UTD. 03/2018 A1c: 6.5>>7.3 >>6.3 today  Hyperlipidemia associated with type 2 diabetes mellitus (HCC)/Peripheral arterial disease (HCC)/Palpitations/IDA/CKD 3/anxiety -She has significant anxiety-  discussed options and she is agreeable to try lexapro 5 mg QD - Continue to  avoid caffeine, stimulants-her vice is excess chocolate. - continue metoprolol 12.5 mg QD - Continue atorvastatin 40 mg daily. - taking iron 65E daily. Continue.  -Continue baby aspirin daily. - avoid NSAIDS; renal dose.  - bmp - CBC - iron panel  Gastroesophageal reflux disease without esophagitis/burning mouth - Stable GERD. controlled with Protonix>> prescribed -Encouraged her to try Biotene 2 times a day. -Avoid spicy foods. - Takes B-complex.  - follow b12, mag and vit d q 3 years>> vit d collected today.   Chronic thoracic back pain and new left jaw discomfort: -Prescribed gabapentin and Zanaflex.  Encouraged her to take the Zanaflex it will help with the jaw discomfort.  No obvious popping or cracking of gel today however the area of  tenderness she did describes is in the joint line.  Encouraged her to avoid foods that require excessive chewing for a few weeks such as steaks or chewing bubblegum  Return in about 4 months   > 25 minutes spent with patient, >50% of time spent face to face counseling and/or coordinating care.   Orders Placed This Encounter  Procedures  . Vitamin D (25 hydroxy)  . Basic Metabolic Panel (BMET)  . CBC  . Iron, TIBC and Ferritin Panel  . Urine Microalbumin w/creat. ratio  . POCT glycosylated hemoglobin (Hb A1C)      Freddi Forster, DO 03/05/2019

## 2019-03-06 ENCOUNTER — Telehealth: Payer: Self-pay | Admitting: Family Medicine

## 2019-03-06 LAB — IRON,TIBC AND FERRITIN PANEL
%SAT: 33 % (calc) (ref 16–45)
Ferritin: 17 ng/mL (ref 16–288)
Iron: 119 ug/dL (ref 45–160)
TIBC: 366 mcg/dL (calc) (ref 250–450)

## 2019-03-06 NOTE — Telephone Encounter (Signed)
Pt was called and given results/instructions. Pt verbalized understanding. Pt was scheduled for [facility]/4 month appt

## 2019-03-06 NOTE — Telephone Encounter (Signed)
Please inform patient the following information: Her labs all look great! Electrolytes, liver and kidney function are all functioning normal. Her iron levels are normal.  Continue her daily iron supplementation. Her vitamin D levels are at the very low end of normal.  I would recommend she supplement 800 units of vitamin D daily.  Vitamin D should be taking with a meal for better absorption.

## 2019-03-11 ENCOUNTER — Telehealth: Payer: Self-pay

## 2019-03-11 DIAGNOSIS — E11638 Type 2 diabetes mellitus with other oral complications: Secondary | ICD-10-CM

## 2019-03-11 MED ORDER — METFORMIN HCL 500 MG PO TABS: ORAL_TABLET | ORAL | 1 refills | Status: DC

## 2019-03-11 MED ORDER — METOPROLOL SUCCINATE ER 25 MG PO TB24: 25.0000 mg | ORAL_TABLET | Freq: Every day | ORAL | 1 refills | Status: DC

## 2019-03-11 NOTE — Telephone Encounter (Signed)
Received fax from [facility] for medication clarification for pts Metoprolol XL 25mg  24 hr tablet and how it is written. Pt was called and she is taking 1 tablet daily. [facility] also needs clarification on metformin due to the way the RX is written. Pt states she takes one tablet in the AM and two tablets in the PM (1,500mg  daily total).   Changed RX, Please advise if okay to send new RX

## 2019-03-11 NOTE — Telephone Encounter (Signed)
Scripts corrected and sent

## 2019-06-27 ENCOUNTER — Other Ambulatory Visit: Payer: Self-pay | Admitting: Family Medicine

## 2019-06-27 DIAGNOSIS — Z1231 Encounter for screening mammogram for malignant neoplasm of breast: Secondary | ICD-10-CM

## 2019-07-02 ENCOUNTER — Encounter: Payer: Self-pay | Admitting: Family Medicine

## 2019-07-02 ENCOUNTER — Other Ambulatory Visit: Payer: Self-pay

## 2019-07-02 ENCOUNTER — Ambulatory Visit: Payer: Medicare PPO | Admitting: Family Medicine

## 2019-07-02 VITALS — BP 118/73 | HR 90 | Temp 97.6°F | Resp 16 | Ht 60.0 in | Wt 131.4 lb

## 2019-07-02 DIAGNOSIS — M545 Low back pain, unspecified: Secondary | ICD-10-CM

## 2019-07-02 DIAGNOSIS — D509 Iron deficiency anemia, unspecified: Secondary | ICD-10-CM | POA: Diagnosis not present

## 2019-07-02 DIAGNOSIS — I739 Peripheral vascular disease, unspecified: Secondary | ICD-10-CM

## 2019-07-02 DIAGNOSIS — F419 Anxiety disorder, unspecified: Secondary | ICD-10-CM | POA: Diagnosis not present

## 2019-07-02 DIAGNOSIS — E113219 Type 2 diabetes mellitus with mild nonproliferative diabetic retinopathy with macular edema, unspecified eye: Secondary | ICD-10-CM

## 2019-07-02 DIAGNOSIS — E1165 Type 2 diabetes mellitus with hyperglycemia: Secondary | ICD-10-CM

## 2019-07-02 DIAGNOSIS — M546 Pain in thoracic spine: Secondary | ICD-10-CM

## 2019-07-02 DIAGNOSIS — G8929 Other chronic pain: Secondary | ICD-10-CM

## 2019-07-02 DIAGNOSIS — E1169 Type 2 diabetes mellitus with other specified complication: Secondary | ICD-10-CM | POA: Diagnosis not present

## 2019-07-02 DIAGNOSIS — R002 Palpitations: Secondary | ICD-10-CM | POA: Diagnosis not present

## 2019-07-02 DIAGNOSIS — E11638 Type 2 diabetes mellitus with other oral complications: Secondary | ICD-10-CM

## 2019-07-02 DIAGNOSIS — E785 Hyperlipidemia, unspecified: Secondary | ICD-10-CM

## 2019-07-02 DIAGNOSIS — R151 Fecal smearing: Secondary | ICD-10-CM

## 2019-07-02 DIAGNOSIS — K219 Gastro-esophageal reflux disease without esophagitis: Secondary | ICD-10-CM

## 2019-07-02 DIAGNOSIS — IMO0002 Reserved for concepts with insufficient information to code with codable children: Secondary | ICD-10-CM

## 2019-07-02 LAB — POCT GLYCOSYLATED HEMOGLOBIN (HGB A1C)
HbA1c POC (<> result, manual entry): 6.4 % (ref 4.0–5.6)
HbA1c, POC (controlled diabetic range): 6.4 % (ref 0.0–7.0)
HbA1c, POC (prediabetic range): 6.4 % (ref 5.7–6.4)
Hemoglobin A1C: 6.4 % — AB (ref 4.0–5.6)

## 2019-07-02 MED ORDER — METFORMIN HCL 500 MG PO TABS: ORAL_TABLET | ORAL | 1 refills | Status: DC

## 2019-07-02 MED ORDER — ESCITALOPRAM OXALATE 5 MG PO TABS: 5.0000 mg | ORAL_TABLET | Freq: Every day | ORAL | 1 refills | Status: DC

## 2019-07-02 MED ORDER — TRAZODONE HCL 50 MG PO TABS: 25.0000 mg | ORAL_TABLET | Freq: Every evening | ORAL | 1 refills | Status: DC | PRN

## 2019-07-02 MED ORDER — ATORVASTATIN CALCIUM 40 MG PO TABS: 40.0000 mg | ORAL_TABLET | Freq: Every day | ORAL | 3 refills | Status: DC

## 2019-07-02 MED ORDER — METOPROLOL SUCCINATE ER 25 MG PO TB24: 25.0000 mg | ORAL_TABLET | Freq: Every day | ORAL | 1 refills | Status: DC

## 2019-07-02 MED ORDER — GABAPENTIN 300 MG PO CAPS: 300.0000 mg | ORAL_CAPSULE | Freq: Every day | ORAL | 1 refills | Status: DC

## 2019-07-02 MED ORDER — FERROUS SULFATE 324 (65 FE) MG PO TBEC: 1.0000 | DELAYED_RELEASE_TABLET | Freq: Every day | ORAL | 3 refills | Status: DC

## 2019-07-02 NOTE — Progress Notes (Signed)
Patient Care Team    Relationship Specialty Notifications Start End  Natalia Leatherwood, DO PCP - General Family Medicine  07/03/18   Rankin, Alford Highland, MD Consulting Physician Ophthalmology  07/03/18   [facility]  Ophthalmology  07/03/18   Allie Bossier, MD Consulting Physician Obstetrics and Gynecology  07/04/18   Lennette Bihari, MD Consulting Physician Cardiology  07/04/18     SUBJECTIVE Chief Complaint  Patient presents with  . Diabetes    Pt is fasting this AM and did not know if LIpid panel needed to be done     HPI: Melissa [name] is a 85 y.o. present for f/u [facility] Diabetes/macular degeneration/nonproliferative retinopathy:  Pt reports compliance with metformin 500 mg in the morning and 1000 mg in the afternoon. Patient denies dizziness, hyperglycemic or hypoglycemic events. Patient denies numbness, tingling in the extremities or nonhealing wounds of feet.  She is prescribed gabapentin 300 mg nightly for Thoracic back pain.  PNA series: Completed 2019 Flu shot: Completed 2020 BMP: 07/03/2018 Urine microal: UTD 02/2019 Foot exam: completed today 03/05/2019 Eye exam: Dr. Dione Booze, routine eye exams with macular degeneration. UTD. 03/2018 A1c: 6.5>>7.3 >>6.3>>6.4 today  Palpitations/hyperlipidemia/peripheral artery disease/anxiety: She has seen a cardiologist to started her on metoprolol 12.5 mg twice daily for her palpitations.  She reports compliance with her metoprolol.  She reports compliance with her Lexapro..   She states she does eat quite a bit of chocolate- but is tryin gto cut back.  She is compliant with baby aspirin and Lipitor 40 mg daily. Echo Doppler study on October 03, 2017 showed an EF of 65 to 70%. There was severe mitral annular calcification with very mild functional MS by valve area Lipid UTD 11/28/2018 CBC 10/03/2018 TSH 07/03/2018  GERD: She reports condition is doing well.. She again complains of burning mouth syndrome. She has seen many specilaist  for this and even had bx completed of her tongue. . She does have a history of GERD.  She reports she is taking Protonix 40 mg daily.  She has been on this for quite some time and takes it daily. Without medication her symptoms return.   IDA: She reports compliance with Iron 65E Qd.  Iron panel was normal 11/2018 on supplement.  Chronic thoracic back pain and new left jaw discomfort: Patient reports the gabapentin has been extremely helpful.  She occasionally will use the tenacity and but make sure not to take at the same time as her gabapentin.  She denies any oversedation.  Fecal smearing: Patient reports fecal incontinence today.  She states she will see a small amount of stool and mucus after wiping even though she has not had a bowel movement.  She denies any bleeding from her rectum.  ROS: See pertinent positives and negatives per HPI.  Patient Active Problem List   Diagnosis Date Noted  . Anxiety 03/05/2019  . Chronic right-sided low back pain without sciatica 08/03/2018  . Chronic bilateral thoracic back pain 08/03/2018  . Iron deficiency anemia 07/04/2018  . Palpitations 07/04/2018  . Peripheral arterial disease (HCC) 07/11/2017  . Varicose veins of both lower extremities 08/31/2016  . Macular degeneration 05/05/2015  . Type 2 DM mild nonproliferative retinopathy, macular edema, uncontrol (HCC) 10/02/2013  . Diabetes mellitus type 2, controlled (HCC) 03/24/2009  . Hyperlipidemia associated with type 2 diabetes mellitus (HCC) 03/24/2009  . GERD 03/24/2009    Social History   Tobacco Use  . Smoking status: Never Smoker  .  Smokeless tobacco: Never Used  Substance Use Topics  . Alcohol use: No    Current Outpatient Medications:  .  acetaminophen (TYLENOL) 500 MG tablet, Take 500 mg by mouth every 6 (six) hours as needed., Disp: , Rfl:  .  aspirin 81 MG tablet, Take 81 mg by mouth daily.  , Disp: , Rfl:  .  atorvastatin (LIPITOR) 40 MG tablet, Take 1 tablet (40 mg total)  by mouth daily., Disp: 90 tablet, Rfl: 3 .  b complex vitamins tablet, Take 1 tablet by mouth daily.  , Disp: , Rfl:  .  Calcium Carbonate-Vitamin D (OYSTER SHELL CALCIUM/D) 250-125 MG-UNIT TABS, Take by mouth., Disp: , Rfl:  .  escitalopram (LEXAPRO) 5 MG tablet, Take 1 tablet (5 mg total) by mouth daily., Disp: 90 tablet, Rfl: 1 .  ferrous sulfate 324 (65 Fe) MG TBEC, Take 1 tablet (325 mg total) by mouth daily., Disp: 90 tablet, Rfl: 3 .  gabapentin (NEURONTIN) 300 MG capsule, Take 1 capsule (300 mg total) by mouth at bedtime., Disp: 90 capsule, Rfl: 1 .  metFORMIN (GLUCOPHAGE) 500 MG tablet, 500 mg (one tablet) in the morning and 1,000 mg (2 tablets) at night, Disp: 270 tablet, Rfl: 1 .  metoprolol succinate (TOPROL XL) 25 MG 24 hr tablet, Take 1 tablet (25 mg total) by mouth daily., Disp: 90 tablet, Rfl: 1 .  Multiple Vitamin (MULTIVITAMIN) tablet, Take 1 tablet by mouth daily.  , Disp: , Rfl:  .  pantoprazole (PROTONIX) 40 MG tablet, Take 1 tablet (40 mg total) by mouth daily., Disp: 90 tablet, Rfl: 3 .  tiZANidine (ZANAFLEX) 4 MG tablet, TAKE ONE TABLET BY MOUTH AT BEDTIME FOR MUSCLE SPASM, Disp: 90 tablet, Rfl: 1  No Known Allergies  OBJECTIVE: BP 118/73 (BP Location: Left Arm, Patient Position: Sitting, Cuff Size: Normal)   Pulse 90   Temp 97.6 F (36.4 C) (Temporal)   Resp 16   Ht 5' (1.524 m)   Wt 131 lb 6 oz (59.6 kg)   SpO2 97%   BMI 25.66 kg/m  Gen: Afebrile. No acute distress.  Nontoxic in presentation.  Well-developed, well-nourished, very pleasant Caucasian female. HENT: AT. [place].  Eyes:Pupils Equal Round Reactive to light, Extraocular movements intact,  Conjunctiva without redness, discharge or icterus. Neck/lymp/endocrine: Supple, no lymphadenopathy, no thyromegaly CV: RRR no murmur, no edema Chest: CTAB, no wheeze or crackles Neuro: Normal gait. PERLA. EOMi. Alert. Oriented x3  Psych: Normal affect, dress and demeanor. Normal speech. Normal thought content and  judgment.    No results found for this or any previous visit (from the past 24 hour(s)).  ASSESSMENT AND PLAN: Melissa [name] is a 85 y.o. female present for  Controlled type 2 diabetes mellitus (HCC)/Type 2 DM mild nonproliferative retinopathy, macular edema, uncontrol (HCC) Stable..Continue metformin 1500 mg total a day- may divide.  PNA series: Completed 2019 Flu shot: Completed 2020 BMP: 07/03/2018 Urine microal: UTD 02/2019 Foot exam: completed today 03/05/2019 Eye exam: Dr. Katy Fitch, routine eye exams with macular degeneration. UTD. 03/2018 A1c: 6.5>>7.3 >>6.3>>6.4 today  Hyperlipidemia associated with type 2 diabetes mellitus (HCC)/Peripheral arterial disease (HCC)/Palpitations/IDA/CKD 3 - Continue to  avoid caffeine, stimulants-her vice is excess chocolate. -Continue metoprolol 12.5 mg QD -Continue atorvastatin 40 mg daily. -Continue taking iron 65E daily. Continue.  -Continue baby aspirin daily. - avoid NSAIDS; renal dose.   Anxiety/insomnia -Anxiety is well controlled.  Although she is endorsing difficulty sleeping.  Discussed options with her today and agreed to try low-dose trazodone.  Patient was counseled on caution with sedation or oversedation. - lexapro 5 mg QD-tolerating -Trazodone 25-50 mg nightly  Gastroesophageal reflux disease without esophagitis/burning mouth/fecal smear - Stable GERD. controlled with Protonix -Encouraged her to try Biotene 2 times a day.   -Sater to her reports of mild burning pain secondary to B12 and supplementation however she was complaining of mild burning prior to starting B12. - Avoid spicy foods. - Takes B-complex.  - follow b12, mag and vit d q 3 years -Fecal smearing is possibly secondary to her hemorrhoids.  Encouraged her to follow-up with her gastroenterologist for further evaluation.  Chronic thoracic back pain and new left jaw discomfort: -Stable. -Continue gabapentin nightly and nonsedating as needed.  She does not take  these medications together and is aware both can cause sedation.  She uses very cautiously.  Return in about 5-6 months     Orders Placed This Encounter  Procedures  . POCT HgB A1C   Meds ordered this encounter  Medications  . metoprolol succinate (TOPROL XL) 25 MG 24 hr tablet    Sig: Take 1 tablet (25 mg total) by mouth daily.    Dispense:  90 tablet    Refill:  1  . gabapentin (NEURONTIN) 300 MG capsule    Sig: Take 1 capsule (300 mg total) by mouth at bedtime.    Dispense:  90 capsule    Refill:  1    Please consider 90 day supplies to promote better adherence  . metFORMIN (GLUCOPHAGE) 500 MG tablet    Sig: 500 mg (one tablet) in the morning and 1,000 mg (2 tablets) at night    Dispense:  270 tablet    Refill:  1  . ferrous sulfate 324 (65 Fe) MG TBEC    Sig: Take 1 tablet (325 mg total) by mouth daily.    Dispense:  90 tablet    Refill:  3  . escitalopram (LEXAPRO) 5 MG tablet    Sig: Take 1 tablet (5 mg total) by mouth daily.    Dispense:  90 tablet    Refill:  1  . atorvastatin (LIPITOR) 40 MG tablet    Sig: Take 1 tablet (40 mg total) by mouth daily.    Dispense:  90 tablet    Refill:  3    DC prior scripts  . traZODone (DESYREL) 50 MG tablet    Sig: Take 0.5-1 tablets (25-50 mg total) by mouth at bedtime as needed for sleep.    Dispense:  90 tablet    Refill:  1      Hiba Garry Claiborne Billings, DO 07/02/2019

## 2019-07-02 NOTE — Patient Instructions (Addendum)
Great to see you today.  a1c 6.4>> this is well controlled, keep up the good work. No med changes for diabetes.   Continue gabapentin 1 tab before bed and start 1/2 tab of trazodone before bed. This med can help you sleep and is safe.   Follow up every 5-6 months since well controlled.   Please call  Be back on the name of the GI doctor you want me to refer you to. Try to call back today so I can get you in the right direction.     [place] web page will also have up to date info on vaccinations offered through [facility] at  [email or web address]. - Appt also required.  - vaccines are administered at the [facility] . If website indicates you are eligible for vaccine- please call [phone]    Participants are asked to wear a face covering at vaccination sites.

## 2019-07-04 ENCOUNTER — Encounter: Payer: Self-pay | Admitting: Family Medicine

## 2019-08-07 ENCOUNTER — Other Ambulatory Visit: Payer: Self-pay

## 2019-08-07 ENCOUNTER — Ambulatory Visit
Admission: RE | Admit: 2019-08-07 | Discharge: 2019-08-07 | Disposition: A | Discharge status: Home / Self Care | Payer: Medicare PPO | Source: Ambulatory Visit | Attending: Family Medicine | Admitting: Family Medicine

## 2019-08-07 DIAGNOSIS — Z1231 Encounter for screening mammogram for malignant neoplasm of breast: Secondary | ICD-10-CM | POA: Diagnosis not present

## 2019-11-04 DIAGNOSIS — E119 Type 2 diabetes mellitus without complications: Secondary | ICD-10-CM | POA: Diagnosis not present

## 2019-11-04 DIAGNOSIS — S161XXA Strain of muscle, fascia and tendon at neck level, initial encounter: Secondary | ICD-10-CM | POA: Diagnosis not present

## 2019-11-04 DIAGNOSIS — M542 Cervicalgia: Secondary | ICD-10-CM | POA: Diagnosis not present

## 2019-11-04 DIAGNOSIS — E785 Hyperlipidemia, unspecified: Secondary | ICD-10-CM | POA: Diagnosis not present

## 2019-12-24 ENCOUNTER — Telehealth: Payer: Self-pay

## 2019-12-24 DIAGNOSIS — K5792 Diverticulitis of intestine, part unspecified, without perforation or abscess without bleeding: Secondary | ICD-10-CM | POA: Diagnosis not present

## 2019-12-24 NOTE — Telephone Encounter (Signed)
Patient has pain in abdomen. She feels like it is a diverticulitis flair. She did mention that she has been eating a lot of fruit. Patient is requesting a medication to be called in.

## 2019-12-24 NOTE — Telephone Encounter (Signed)
Patient advised that Dr Claiborne Billings is not in the office today and it doesn't look like she was seen for diverticulitis in the past.  Patient offered appointment w/ another provider but states she will wait because she is having car trouble.  She will CB if she wants to be seen.

## 2020-02-11 ENCOUNTER — Encounter: Payer: Self-pay | Admitting: Family Medicine

## 2020-02-11 ENCOUNTER — Other Ambulatory Visit: Payer: Self-pay

## 2020-02-11 ENCOUNTER — Ambulatory Visit (INDEPENDENT_AMBULATORY_CARE_PROVIDER_SITE_OTHER): Payer: Medicare PPO | Admitting: Family Medicine

## 2020-02-11 VITALS — BP 114/76 | HR 90 | Temp 97.7°F | Ht 60.0 in | Wt 128.8 lb

## 2020-02-11 DIAGNOSIS — M545 Low back pain, unspecified: Secondary | ICD-10-CM

## 2020-02-11 DIAGNOSIS — E785 Hyperlipidemia, unspecified: Secondary | ICD-10-CM

## 2020-02-11 DIAGNOSIS — N8110 Cystocele, unspecified: Secondary | ICD-10-CM

## 2020-02-11 DIAGNOSIS — E1165 Type 2 diabetes mellitus with hyperglycemia: Secondary | ICD-10-CM

## 2020-02-11 DIAGNOSIS — R151 Fecal smearing: Secondary | ICD-10-CM

## 2020-02-11 DIAGNOSIS — E1169 Type 2 diabetes mellitus with other specified complication: Secondary | ICD-10-CM | POA: Diagnosis not present

## 2020-02-11 DIAGNOSIS — E11638 Type 2 diabetes mellitus with other oral complications: Secondary | ICD-10-CM

## 2020-02-11 DIAGNOSIS — E113219 Type 2 diabetes mellitus with mild nonproliferative diabetic retinopathy with macular edema, unspecified eye: Secondary | ICD-10-CM

## 2020-02-11 DIAGNOSIS — K219 Gastro-esophageal reflux disease without esophagitis: Secondary | ICD-10-CM | POA: Diagnosis not present

## 2020-02-11 DIAGNOSIS — IMO0002 Reserved for concepts with insufficient information to code with codable children: Secondary | ICD-10-CM

## 2020-02-11 LAB — CBC
HCT: 39.6 % (ref 36.0–46.0)
Hemoglobin: 13.1 g/dL (ref 12.0–15.0)
MCHC: 33 g/dL (ref 30.0–36.0)
MCV: 94.2 fl (ref 78.0–100.0)
Platelets: 261 10*3/uL (ref 150.0–400.0)
RBC: 4.2 Mil/uL (ref 3.87–5.11)
RDW: 14.1 % (ref 11.5–15.5)
WBC: 5.5 10*3/uL (ref 4.0–10.5)

## 2020-02-11 LAB — COMPREHENSIVE METABOLIC PANEL
ALT: 11 U/L (ref 0–35)
AST: 14 U/L (ref 0–37)
Albumin: 4.2 g/dL (ref 3.5–5.2)
Alkaline Phosphatase: 57 U/L (ref 39–117)
BUN: 18 mg/dL (ref 6–23)
CO2: 27 mEq/L (ref 19–32)
Calcium: 9.5 mg/dL (ref 8.4–10.5)
Chloride: 104 mEq/L (ref 96–112)
Creatinine, Ser: 1.01 mg/dL (ref 0.40–1.20)
GFR: 52.31 mL/min — ABNORMAL LOW (ref >60.00)
Glucose, Bld: 107 mg/dL — ABNORMAL HIGH (ref 70–99)
Potassium: 4.8 mEq/L (ref 3.5–5.1)
Sodium: 138 mEq/L (ref 135–145)
Total Bilirubin: 0.6 mg/dL (ref 0.2–1.2)
Total Protein: 6.7 g/dL (ref 6.0–8.3)

## 2020-02-11 LAB — HEMOGLOBIN A1C: Hgb A1c MFr Bld: 6.8 % — ABNORMAL HIGH (ref 4.6–6.5)

## 2020-02-11 LAB — LIPID PANEL
Cholesterol: 155 mg/dL (ref 0–200)
HDL: 66.8 mg/dL (ref >39.00)
LDL Cholesterol: 60 mg/dL (ref 0–99)
NonHDL: 88.35
Total CHOL/HDL Ratio: 2
Triglycerides: 140 mg/dL (ref 0.0–149.0)
VLDL: 28 mg/dL (ref 0.0–40.0)

## 2020-02-11 LAB — MICROALBUMIN / CREATININE URINE RATIO
Creatinine,U: 121.5 mg/dL
Microalb Creat Ratio: 0.6 mg/g (ref 0.0–30.0)
Microalb, Ur: 0.7 mg/dL (ref 0.0–1.9)

## 2020-02-11 LAB — TSH: TSH: 1.76 u[IU]/mL (ref 0.35–4.50)

## 2020-02-11 MED ORDER — ESCITALOPRAM OXALATE 5 MG PO TABS: 5.0000 mg | ORAL_TABLET | Freq: Every day | ORAL | 1 refills | Status: DC

## 2020-02-11 MED ORDER — METFORMIN HCL 500 MG PO TABS: ORAL_TABLET | ORAL | 1 refills | Status: DC

## 2020-02-11 MED ORDER — METOPROLOL SUCCINATE ER 25 MG PO TB24: 25.0000 mg | ORAL_TABLET | Freq: Every day | ORAL | 1 refills | Status: DC

## 2020-02-11 MED ORDER — ATORVASTATIN CALCIUM 40 MG PO TABS: 40.0000 mg | ORAL_TABLET | Freq: Every day | ORAL | 3 refills | Status: DC

## 2020-02-11 MED ORDER — PANTOPRAZOLE SODIUM 40 MG PO TBEC: 40.0000 mg | DELAYED_RELEASE_TABLET | Freq: Every day | ORAL | 3 refills | Status: DC

## 2020-02-11 MED ORDER — TIZANIDINE HCL 4 MG PO TABS: ORAL_TABLET | ORAL | 1 refills | Status: DC

## 2020-02-11 MED ORDER — TRAZODONE HCL 50 MG PO TABS: 25.0000 mg | ORAL_TABLET | Freq: Every evening | ORAL | 1 refills | Status: DC | PRN

## 2020-02-11 MED ORDER — GABAPENTIN 300 MG PO CAPS: 300.0000 mg | ORAL_CAPSULE | Freq: Every day | ORAL | 1 refills | Status: DC

## 2020-02-11 NOTE — Progress Notes (Signed)
Patient Care Team    Relationship Specialty Notifications Start End  Melissa Hillock, DO PCP - General Family Medicine  07/03/18   Rankin, Melissa Demark, MD Consulting Physician Ophthalmology  07/03/18   [name]  Ophthalmology  07/03/18   Melissa Filbert, MD Consulting Physician Obstetrics and Gynecology  07/04/18   Melissa Sine, MD Consulting Physician Cardiology  07/04/18     SUBJECTIVE Chief Complaint  Patient presents with  . Follow-up   Of note: Patient has received her second maternal Covid vaccine but she is uncertain the date.  She will call in with this information so that we may add it to her records.  HPI: Melissa [name] is a 85 y.o. present for f/u [facility] she is fasting today Diabetes/macular degeneration/nonproliferative retinopathy:  Pt reports compliance with metformin 500 mg in the morning and 1000 mg in the afternoon. Patient denies dizziness, hyperglycemic or hypoglycemic events. Patient denies numbness, tingling in the extremities or nonhealing wounds of feet.   She is prescribed gabapentin 300 mg nightly for Thoracic back pain.  PNA series: Completed 2019 Flu shot: Completed 2020> return in approximately 1 month for nurse appointment for high-dose flu vaccine administration Urine microal: Collected today Foot exam: completed today completed 02/11/2020 Eye exam:  Completed 02/11/2020 .Dr. Katy Fitch, routine eye exams with macular degeneration.  A1c: 6.5>>7.3 >>6.3>>6.4> collected  today  Palpitations/hyperlipidemia/peripheral artery disease/anxiety:  She reports appliance with her metoprolol 25 mg daily.  She reports she is uncertain if she is taking the Lexapro daily.  She states even when it was prescribed she did not take it daily she took as needed.  She asked today about 2 other medicines (Prozac and Zoloft) that her family members taken if it would help her.  She states she does eat quite a bit of chocolate- but is tryin gto cut back.  She is prescribed  Lipitor 40 mg daily and takes a daily baby aspirin.  Echo Doppler study on October 03, 2017 showed an EF of 65 to 70%. There was severe mitral annular calcification with very mild functional MS by valve area  GERD: Patient reports condition is well controlled.  She is compliant with Protonix 40 mg daily.  She has been on this for quite some time and takes it daily. Without medication her symptoms return.   Chronic thoracic back pain and new left jaw discomfort: Patient reports the gabapentin has been extremely helpful.  She occasionally will use the Zanaflex and but make sure not to take at the same time as her gabapentin.  She denies any oversedation.  Fecal smearing: Patient states today she would like referral to Dr. Earlean Shawl which was her prior gastroenterologist.  Prior note: Patient reports fecal incontinence today.  She states she will see a small amount of stool and mucus after wiping even though she has not had a bowel movement.  She denies any bleeding from her rectum.  ROS: See pertinent positives and negatives per HPI.  Patient Active Problem List   Diagnosis Date Noted  . Anxiety 03/05/2019  . Chronic right-sided low back pain without sciatica 08/03/2018  . Chronic bilateral thoracic back pain 08/03/2018  . Iron deficiency anemia 07/04/2018  . Palpitations 07/04/2018  . Peripheral arterial disease ([place]) 07/11/2017  . Varicose veins of both lower extremities 08/31/2016  . Macular degeneration 05/05/2015  . Type 2 DM mild nonproliferative retinopathy, macular edema, uncontrol ([place]) 10/02/2013  . Diabetes mellitus type 2, controlled ([place]) 03/24/2009  .  Hyperlipidemia associated with type 2 diabetes mellitus ([place]) 03/24/2009  . GERD 03/24/2009    Social History   Tobacco Use  . Smoking status: Never Smoker  . Smokeless tobacco: Never Used  Substance Use Topics  . Alcohol use: No    Current Outpatient Medications:  .  aspirin 81 MG tablet, Take 81 mg by mouth daily.  ,  Disp: , Rfl:  .  atorvastatin (LIPITOR) 40 MG tablet, Take 1 tablet (40 mg total) by mouth daily., Disp: 90 tablet, Rfl: 3 .  b complex vitamins tablet, Take 1 tablet by mouth daily.  , Disp: , Rfl:  .  Calcium Carbonate-Vitamin D (OYSTER SHELL CALCIUM/D) 250-125 MG-UNIT TABS, Take by mouth., Disp: , Rfl:  .  escitalopram (LEXAPRO) 5 MG tablet, Take 1 tablet (5 mg total) by mouth daily., Disp: 90 tablet, Rfl: 1 .  gabapentin (NEURONTIN) 300 MG capsule, Take 1 capsule (300 mg total) by mouth at bedtime., Disp: 90 capsule, Rfl: 1 .  metFORMIN (GLUCOPHAGE) 500 MG tablet, 500 mg (one tablet) in the morning and 1,000 mg (2 tablets) at night, Disp: 270 tablet, Rfl: 1 .  metoprolol succinate (TOPROL XL) 25 MG 24 hr tablet, Take 1 tablet (25 mg total) by mouth daily., Disp: 90 tablet, Rfl: 1 .  Multiple Vitamin (MULTIVITAMIN) tablet, Take 1 tablet by mouth daily.  , Disp: , Rfl:  .  pantoprazole (PROTONIX) 40 MG tablet, Take 1 tablet (40 mg total) by mouth daily., Disp: 90 tablet, Rfl: 3 .  tiZANidine (ZANAFLEX) 4 MG tablet, TAKE ONE TABLET BY MOUTH AT BEDTIME FOR MUSCLE SPASM PRN, Disp: 90 tablet, Rfl: 1 .  traZODone (DESYREL) 50 MG tablet, Take 0.5-1 tablets (25-50 mg total) by mouth at bedtime as needed for sleep., Disp: 90 tablet, Rfl: 1  No Known Allergies  OBJECTIVE: BP 114/76   Pulse 90   Temp 97.7 F (36.5 C) (Oral)   Ht 5' (1.524 m)   Wt 128 lb 12.8 oz (58.4 kg)   SpO2 95%   BMI 25.15 kg/m  Gen: Afebrile. No acute distress.  Nontoxic. pleasant female.  HENT: AT. [place].  Eyes:Pupils Equal Round Reactive to light, Extraocular movements intact,  Conjunctiva without redness, discharge or icterus. Neck/lymp/endocrine: Supple,no lymphadenopathy, no thyromegaly CV: RRR no murmur, no edema, +2/4 P posterior tibialis pulses Chest: CTAB, no wheeze or crackles Abd: Soft. NTND. BS present. No  Masses palpated.  Skin: no rashes, purpura or petechiae.  Neuro:  Normal gait. PERLA. EOMi. Alert.  Oriented x3 Psych: Normal affect, dress and demeanor. Normal speech. Normal thought content and judgment..  Foot: no erythema or nonhealing wounds.   No results found for this or any previous visit (from the past 24 hour(s)).  ASSESSMENT AND PLAN: Melissa [name] is a 85 y.o. female present for  Controlled type 2 diabetes mellitus (HCC)/Type 2 DM mild nonproliferative retinopathy, macular edema, uncontrol ([place]) Has been well controlled.  Continue metformin 1500 mg total a day- may divide. Will adjust dose if necessary after results.  PNA series: Completed 2019 Flu shot: Completed 2020 Urine microal: UTD 02/2019 Foot exam: completed today 03/05/2019 Eye exam: Dr. Katy Fitch, routine eye exams with macular degeneration. UTD.  A1c: 6.5>>7.3 >>6.3>>6.4> collected  today  Hyperlipidemia associated with type 2 diabetes mellitus (HCC)/Peripheral arterial disease (HCC)/Palpitations/IDA/CKD 3 - Continue to  avoid caffeine, stimulants-her vice is excess chocolate. -Continue metoprolol 25 mg QD - continue  atorvastatin 40 mg daily. - continue  baby aspirin daily. - avoid NSAIDS;  renal dose.   Anxiety/insomnia -stable.  - lexapro 5 mg QD-tolerating -Trazodone 25-50 mg nightly  Gastroesophageal reflux disease without esophagitis - stable.   - continue Protonix -Encouraged her to try Biotene 2 times a day.    Fecal smearing/hemorrhoids: Placed referral back to Dr. Earlean Shawl per patient request  Bladder prolapse: Patient had seen Dr. Hulan Fray in the past.  She would like a reevaluation.  She was told in the past she would need a surgical procedure or use of a pessary daily in order to help her with her urinary symptoms.  At that time she elected to do neither.  She would like another opinion today.  Chronic thoracic back pain and new left jaw discomfort: -stable.  -Continue gabapentin nightly and nonsedating as needed.   - Zanaflex prn  Return in about 5-6 months     Orders Placed This  Encounter  Procedures  . Urine Microalbumin w/creat. ratio  . CBC  . Comp Met (CMET)  . TSH  . Hemoglobin A1c  . Lipid panel  . Ambulatory referral to Gastroenterology  . Ambulatory referral to Gynecology   Meds ordered this encounter  Medications  . traZODone (DESYREL) 50 MG tablet    Sig: Take 0.5-1 tablets (25-50 mg total) by mouth at bedtime as needed for sleep.    Dispense:  90 tablet    Refill:  1  . pantoprazole (PROTONIX) 40 MG tablet    Sig: Take 1 tablet (40 mg total) by mouth daily.    Dispense:  90 tablet    Refill:  3  . metoprolol succinate (TOPROL XL) 25 MG 24 hr tablet    Sig: Take 1 tablet (25 mg total) by mouth daily.    Dispense:  90 tablet    Refill:  1  . metFORMIN (GLUCOPHAGE) 500 MG tablet    Sig: 500 mg (one tablet) in the morning and 1,000 mg (2 tablets) at night    Dispense:  270 tablet    Refill:  1  . gabapentin (NEURONTIN) 300 MG capsule    Sig: Take 1 capsule (300 mg total) by mouth at bedtime.    Dispense:  90 capsule    Refill:  1  . tiZANidine (ZANAFLEX) 4 MG tablet    Sig: TAKE ONE TABLET BY MOUTH AT BEDTIME FOR MUSCLE SPASM PRN    Dispense:  90 tablet    Refill:  1    Hold script until pt request  . escitalopram (LEXAPRO) 5 MG tablet    Sig: Take 1 tablet (5 mg total) by mouth daily.    Dispense:  90 tablet    Refill:  1  . atorvastatin (LIPITOR) 40 MG tablet    Sig: Take 1 tablet (40 mg total) by mouth daily.    Dispense:  90 tablet    Refill:  [place], DO 02/11/2020

## 2020-02-11 NOTE — Patient Instructions (Addendum)
We will call you with lab results  Great to see you today.  I have refilled your medications.   Next appt early February for [facility]  I am referring you back to Dr. Kinnie Scales for your rectum per your request.   I am referring you to a new gynecologist for your bladder.

## 2020-02-12 ENCOUNTER — Telehealth: Payer: Self-pay

## 2020-02-12 NOTE — Telephone Encounter (Signed)
Entered the 1st covid vaccine into Historical Immunizations. The second vaccination was already logged.

## 2020-02-12 NOTE — Telephone Encounter (Signed)
Calling to give dates of Moderna vaccine.  Patient stated she was given 1st shot on 08/21/19 and 2nd shot on 09/18/19.  No callback needed at this time, unless there are questions/concerns. [phone]

## 2020-02-18 ENCOUNTER — Encounter: Payer: Self-pay | Admitting: Obstetrics and Gynecology

## 2020-02-18 ENCOUNTER — Ambulatory Visit: Payer: Medicare PPO | Admitting: Obstetrics and Gynecology

## 2020-02-18 ENCOUNTER — Other Ambulatory Visit: Payer: Self-pay

## 2020-02-18 VITALS — BP 124/62 | HR 88 | Resp 22 | Ht 59.0 in | Wt 128.0 lb

## 2020-02-18 DIAGNOSIS — N993 Prolapse of vaginal vault after hysterectomy: Secondary | ICD-10-CM | POA: Diagnosis not present

## 2020-02-18 NOTE — Progress Notes (Signed)
GYNECOLOGY  VISIT   HPI: 85 y.o.   Widowed  Caucasian  female   G3P3 with No LMP recorded. Patient has had a hysterectomy.   here for vaginal prolapse "for a long time."   States some urinary incontinence.  Reports a pulling feeling with urination.  Denies hematuria.   Patient is not certain if she had a bladder tack in the past about 50 years ago.   She is having some fecal mucousy incontinence and hemorrhoids.  Not sexually active.   GYNECOLOGIC HISTORY: No LMP recorded. Patient has had a hysterectomy.   Still has one ovary remaining.  Contraception:  Hyst Menopausal hormone therapy:  none Last mammogram:  08-07-19 3D/Neg/density B/BiRads1 Last pap smear: Years ago--normal. Never had an abnormal pap.        OB History    Gravida  3   Para  3   Term      Preterm      AB      Living  3     SAB      TAB      Ectopic      Multiple      Live Births  3              Patient Active Problem List   Diagnosis Date Noted  . Anxiety 03/05/2019  . Chronic right-sided low back pain without sciatica 08/03/2018  . Chronic bilateral thoracic back pain 08/03/2018  . Iron deficiency anemia 07/04/2018  . Palpitations 07/04/2018  . Peripheral arterial disease (HCC) 07/11/2017  . Varicose veins of both lower extremities 08/31/2016  . Macular degeneration 05/05/2015  . Type 2 DM mild nonproliferative retinopathy, macular edema, uncontrol (HCC) 10/02/2013  . Diabetes mellitus type 2, controlled (HCC) 03/24/2009  . Hyperlipidemia associated with type 2 diabetes mellitus (HCC) 03/24/2009  . GERD 03/24/2009    Past Medical History:  Diagnosis Date  . Burning tongue syndrome 11/11/2015  . Colon polyps   . Cystocele with prolapse 2019   Dr. Marice Potter- Monitor for now.   . Detached retina, right   . Diabetes mellitus   . Diaphragmatic hernia   . GERD (gastroesophageal reflux disease)   . Hyperlipidemia   . Insomnia   . Macular degeneration     Past Surgical History:   Procedure Laterality Date  . ABDOMINAL HYSTERECTOMY    . FOOT SURGERY    . OOPHORECTOMY     Rt.ovary removed with Hyst  . RETINAL DETACHMENT SURGERY    . TONSILLECTOMY      Current Outpatient Medications  Medication Sig Dispense Refill  . aspirin 81 MG tablet Take 81 mg by mouth daily.      Marland Kitchen atorvastatin (LIPITOR) 40 MG tablet Take 1 tablet (40 mg total) by mouth daily. 90 tablet 3  . b complex vitamins tablet Take 1 tablet by mouth daily.      . Calcium Carbonate-Vitamin D (OYSTER SHELL CALCIUM/D) 250-125 MG-UNIT TABS Take by mouth.    . escitalopram (LEXAPRO) 5 MG tablet Take 1 tablet (5 mg total) by mouth daily. 90 tablet 1  . gabapentin (NEURONTIN) 300 MG capsule Take 1 capsule (300 mg total) by mouth at bedtime. 90 capsule 1  . metFORMIN (GLUCOPHAGE) 500 MG tablet 500 mg (one tablet) in the morning and 1,000 mg (2 tablets) at night 270 tablet 1  . metoprolol succinate (TOPROL XL) 25 MG 24 hr tablet Take 1 tablet (25 mg total) by mouth daily. 90 tablet 1  .  Multiple Vitamin (MULTIVITAMIN) tablet Take 1 tablet by mouth daily.      . pantoprazole (PROTONIX) 40 MG tablet Take 1 tablet (40 mg total) by mouth daily. 90 tablet 3  . tiZANidine (ZANAFLEX) 4 MG tablet TAKE ONE TABLET BY MOUTH AT BEDTIME FOR MUSCLE SPASM PRN 90 tablet 1  . traZODone (DESYREL) 50 MG tablet Take 0.5-1 tablets (25-50 mg total) by mouth at bedtime as needed for sleep. 90 tablet 1   No current facility-administered medications for this visit.     ALLERGIES: Patient has no known allergies.  Family History  Problem Relation Age of Onset  . Heart disease Other   . Breast cancer Other   . Breast cancer Mother        metastatic--to bone  . CAD Father   . Cancer Maternal Grandmother        unknown  . Cancer Paternal Grandmother        colon    Social History   Socioeconomic History  . Marital status: Married    Spouse name: Not on file  . Number of children: Not on file  . Years of education: Not on  file  . Highest education level: Not on file  Occupational History  . Not on file  Tobacco Use  . Smoking status: Never Smoker  . Smokeless tobacco: Never Used  Vaping Use  . Vaping Use: Never used  Substance and Sexual Activity  . Alcohol use: No  . Drug use: No  . Sexual activity: Not Currently    Birth control/protection: Surgical    Comment: Hyst--Lt.ovary remains  Other Topics Concern  . Not on file  Social History Narrative   Marital status/children/pets: Widowed.   Education/employment: 12th grade education.  Retired.   Safety:      -smoke alarm in the home:Yes     - wears seatbelt: Yes     - Feels safe in their relationships: Yes   Social Determinants of Health   Financial Resource Strain:   . Difficulty of Paying Living Expenses: Not on file  Food Insecurity:   . Worried About Programme researcher, broadcasting/film/video in the Last Year: Not on file  . Ran Out of Food in the Last Year: Not on file  Transportation Needs:   . Lack of Transportation (Medical): Not on file  . Lack of Transportation (Non-Medical): Not on file  Physical Activity:   . Days of Exercise per Week: Not on file  . Minutes of Exercise per Session: Not on file  Stress:   . Feeling of Stress : Not on file  Social Connections:   . Frequency of Communication with Friends and Family: Not on file  . Frequency of Social Gatherings with Friends and Family: Not on file  . Attends Religious Services: Not on file  . Active Member of Clubs or Organizations: Not on file  . Attends Banker Meetings: Not on file  . Marital Status: Not on file  Intimate Partner Violence:   . Fear of Current or Ex-Partner: Not on file  . Emotionally Abused: Not on file  . Physically Abused: Not on file  . Sexually Abused: Not on file    Review of Systems  Genitourinary: Positive for frequency (but doesn't empty much).  All other systems reviewed and are negative.   PHYSICAL EXAMINATION:    BP 124/62   Pulse 88   Resp  (!) 22   Ht 4\' 11"  (1.499 m)   Wt 128 lb (  58.1 kg)   BMI 25.85 kg/m     General appearance: alert, cooperative and appears stated age Head: Normocephalic, without obvious abnormality, atraumatic Lungs: clear to auscultation bilaterally Heart: regular rate and rhythm Abdomen: soft, non-tender, no masses,  no organomegaly No abnormal inguinal nodes palpated Neurologic: Grossly normal  Pelvic: External genitalia:  no lesions              Urethra:  normal appearing urethra with no masses, tenderness or lesions              Bartholins and Skenes: normal                 Vagina: normal appearing vagina with normal color and discharge, no lesions.  Vaginal vault eversion with third degree cystocele.               Cervix: no lesions                Bimanual Exam:  Uterus:  Absent.               Adnexa: no mass, fullness, tenderness              Rectal exam: Yes.  .  Confirms.              Anus:  normal sphincter tone, no lesions.    Chaperone was present for exam.  ASSESSMENT  Status post hysterectomy with unilateral oophorectomy.  Dr. Winfred Burn.  Vaginal vault prolapse. Fecal soiling.   PLAN  We discussed observation versus pessary versus surgery.   Surgery may include vaginal reconstruction versus colpocleisis.   She will consider options for care. I have asked her to observe for urinary tract infections, which would indicate the need to proceed with some form of treatment.  Metamucil for fecal soiling.  ACOG HO on prolapse.  FU prn.

## 2020-02-25 DIAGNOSIS — E785 Hyperlipidemia, unspecified: Secondary | ICD-10-CM | POA: Diagnosis not present

## 2020-02-25 DIAGNOSIS — R159 Full incontinence of feces: Secondary | ICD-10-CM | POA: Diagnosis not present

## 2020-02-25 DIAGNOSIS — E119 Type 2 diabetes mellitus without complications: Secondary | ICD-10-CM | POA: Diagnosis not present

## 2020-03-11 ENCOUNTER — Encounter (INDEPENDENT_AMBULATORY_CARE_PROVIDER_SITE_OTHER): Payer: Medicare PPO | Admitting: Ophthalmology

## 2020-04-01 DIAGNOSIS — H353114 Nonexudative age-related macular degeneration, right eye, advanced atrophic with subfoveal involvement: Secondary | ICD-10-CM | POA: Insufficient documentation

## 2020-04-01 DIAGNOSIS — Z961 Presence of intraocular lens: Secondary | ICD-10-CM | POA: Insufficient documentation

## 2020-04-01 DIAGNOSIS — H35362 Drusen (degenerative) of macula, left eye: Secondary | ICD-10-CM | POA: Insufficient documentation

## 2020-04-01 DIAGNOSIS — H43812 Vitreous degeneration, left eye: Secondary | ICD-10-CM | POA: Insufficient documentation

## 2020-04-13 ENCOUNTER — Ambulatory Visit (INDEPENDENT_AMBULATORY_CARE_PROVIDER_SITE_OTHER): Payer: Medicare PPO | Admitting: Ophthalmology

## 2020-04-13 ENCOUNTER — Other Ambulatory Visit: Payer: Self-pay

## 2020-04-13 ENCOUNTER — Encounter (INDEPENDENT_AMBULATORY_CARE_PROVIDER_SITE_OTHER): Payer: Self-pay | Admitting: Ophthalmology

## 2020-04-13 DIAGNOSIS — Z961 Presence of intraocular lens: Secondary | ICD-10-CM

## 2020-04-13 DIAGNOSIS — H35362 Drusen (degenerative) of macula, left eye: Secondary | ICD-10-CM

## 2020-04-13 DIAGNOSIS — H353114 Nonexudative age-related macular degeneration, right eye, advanced atrophic with subfoveal involvement: Secondary | ICD-10-CM

## 2020-04-13 DIAGNOSIS — H353121 Nonexudative age-related macular degeneration, left eye, early dry stage: Secondary | ICD-10-CM | POA: Diagnosis not present

## 2020-04-13 DIAGNOSIS — H43812 Vitreous degeneration, left eye: Secondary | ICD-10-CM

## 2020-04-13 LAB — HM DIABETES EYE EXAM

## 2020-04-13 NOTE — Patient Instructions (Signed)
Patient to contact the office promptly for new onset visual acuity declines or distortions

## 2020-04-13 NOTE — Assessment & Plan Note (Signed)
No holes or tears 

## 2020-04-13 NOTE — Progress Notes (Signed)
04/13/2020     CHIEF COMPLAINT Patient presents for Retina Follow Up   HISTORY OF PRESENT ILLNESS: Melissa [name] is a 85 y.o. female who presents to the clinic today for:   HPI    Retina Follow Up    Patient presents with  Dry AMD.  In both eyes.  This started 1 year ago.  Severity is mild.  Duration of 1 year.  Since onset it is stable.          Comments    1 Year AMD F/U OU  Pt denies noticeable changes to [place] OU since last visit. Pt denies ocular pain, flashes of light, or floaters OU. Pt sts, "I need to get some new glasses."       Last edited by Ileana Roup, COA on 04/13/2020  9:16 AM. (History)      Referring physician: Natalia Leatherwood, DO [address] [place],  [place] [address]  HISTORICAL INFORMATION:   Selected notes from the MEDICAL RECORD NUMBER    Lab Results  Component Value Date   HGBA1C 6.8 (H) 02/11/2020     CURRENT MEDICATIONS: No current outpatient medications on file. (Ophthalmic Drugs)   No current facility-administered medications for this visit. (Ophthalmic Drugs)   Current Outpatient Medications (Other)  Medication Sig  . aspirin 81 MG tablet Take 81 mg by mouth daily.    Marland Kitchen atorvastatin (LIPITOR) 40 MG tablet Take 1 tablet (40 mg total) by mouth daily.  Marland Kitchen b complex vitamins tablet Take 1 tablet by mouth daily.    . Calcium Carbonate-Vitamin D (OYSTER SHELL CALCIUM/D) 250-125 MG-UNIT TABS Take by mouth.  . escitalopram (LEXAPRO) 5 MG tablet Take 1 tablet (5 mg total) by mouth daily.  Marland Kitchen gabapentin (NEURONTIN) 300 MG capsule Take 1 capsule (300 mg total) by mouth at bedtime.  . metFORMIN (GLUCOPHAGE) 500 MG tablet 500 mg (one tablet) in the morning and 1,000 mg (2 tablets) at night  . metoprolol succinate (TOPROL XL) 25 MG 24 hr tablet Take 1 tablet (25 mg total) by mouth daily.  . Multiple Vitamin (MULTIVITAMIN) tablet Take 1 tablet by mouth daily.    . pantoprazole (PROTONIX) 40 MG tablet Take 1 tablet (40 mg total) by mouth daily.  Marland Kitchen  tiZANidine (ZANAFLEX) 4 MG tablet TAKE ONE TABLET BY MOUTH AT BEDTIME FOR MUSCLE SPASM PRN  . traZODone (DESYREL) 50 MG tablet Take 0.5-1 tablets (25-50 mg total) by mouth at bedtime as needed for sleep.   No current facility-administered medications for this visit. (Other)      REVIEW OF SYSTEMS:    ALLERGIES No Known Allergies  PAST MEDICAL HISTORY Past Medical History:  Diagnosis Date  . Burning tongue syndrome 11/11/2015  . Colon polyps   . Cystocele with prolapse 2019   Dr. Marice Potter- Monitor for now.   . Detached retina, right   . Diabetes mellitus   . Diaphragmatic hernia   . GERD (gastroesophageal reflux disease)   . Hyperlipidemia   . Insomnia   . Macular degeneration    Past Surgical History:  Procedure Laterality Date  . ABDOMINAL HYSTERECTOMY    . FOOT SURGERY    . OOPHORECTOMY     Rt.ovary removed with Hyst  . RETINAL DETACHMENT SURGERY    . TONSILLECTOMY      FAMILY HISTORY Family History  Problem Relation Age of Onset  . Heart disease Other   . Breast cancer Other   . Breast cancer Mother  metastatic--to bone  . CAD Father   . Cancer Maternal Grandmother        unknown  . Cancer Paternal Grandmother        colon    SOCIAL HISTORY Social History   Tobacco Use  . Smoking status: Never Smoker  . Smokeless tobacco: Never Used  Vaping Use  . Vaping Use: Never used  Substance Use Topics  . Alcohol use: No  . Drug use: No         OPHTHALMIC EXAM:  Base Eye Exam    Visual Acuity (ETDRS)      Right Left   Dist cc E card @ 3' 20/25 +1   Dist ph cc NI    Correction: Glasses       Tonometry (Tonopen, 9:16 AM)      Right Left   Pressure 14 19       Pupils      Dark Light Shape React APD   Right 5 4 Irregular Brisk Trace   Left 4 3 Round Brisk None       Visual Fields (Counting fingers)      Left Right    Full Full       Extraocular Movement      Right Left    Full Full       Neuro/Psych    Oriented x3: Yes    Mood/Affect: Normal       Dilation    Both eyes: 1.0% Mydriacyl, 2.5% Phenylephrine @ 9:20 AM        Slit Lamp and Fundus Exam    External Exam      Right Left   External Normal Normal       Slit Lamp Exam      Right Left   Lids/Lashes Normal Normal   Conjunctiva/Sclera White and quiet White and quiet   Cornea Clear Clear   Anterior Chamber Deep and quiet Deep and quiet   Iris Round and reactive Round and reactive   Lens Centered posterior chamber intraocular lens, no residual oil globules in the visual axis Centered posterior chamber intraocular lens   Anterior Vitreous Normal Normal       Fundus Exam      Right Left   Posterior Vitreous Clear vitrectomized Normal   Disc Normal Normal   C/D Ratio 0.65 0.5   Macula Retinal pigment epithelial atrophy - geographic no macular thickening, Hard drusen   Vessels Normal Normal   Periphery Good PRP, chorioretinal scarring, stable, a attached           IMAGING AND PROCEDURES  Imaging and Procedures for 04/13/20  OCT, Retina - OU - Both Eyes       Right Eye Quality was good. Scan locations included subfoveal. Progression has been stable. Findings include abnormal foveal contour.   Left Eye Quality was good. Scan locations included subfoveal. Central Foveal Thickness: 291. Progression has been stable. Findings include normal foveal contour, no SRF.   Notes Diffuse macular and foveal atrophy and small macular hole remains OD       Color Fundus Photography Optos - OU - Both Eyes       Right Eye Progression has been stable. Disc findings include normal observations. Macula : geographic atrophy. Vessels : normal observations. Periphery : RPE abnormality.   Left Eye Progression has been stable. Disc findings include normal observations. Macula : drusen. Vessels : normal observations. Periphery : normal observations.   Notes Prior retinal detachment OD, stable  good chorioretinal scarring                  ASSESSMENT/PLAN:  Advanced nonexudative age-related macular degeneration of right eye with subfoveal involvement The nature of dry age related macular degeneration was discussed with the patient as well as its possible conversion to wet. The results of the AREDS 2 study was discussed with the patient. A diet rich in dark leafy green vegetables was advised and specific recommendations were made regarding supplements with AREDS 2 formulation . Control of hypertension and serum cholesterol may slow the disease. Smoking cessation is mandatory to slow the disease and diminish the risk of progressing to wet age related macular degeneration. The patient was instructed in the use of an [place] Grid and was told to return immediately for any changes in the Grid. Stressed to the patient do not rub eyes  Posterior vitreous detachment of left eye No holes or tears  Early stage nonexudative age-related macular degeneration of left eye No active disease OS      ICD-10-CM   1. Advanced nonexudative age-related macular degeneration of right eye with subfoveal involvement  H35.3114 OCT, Retina - OU - Both Eyes    Color Fundus Photography Optos - OU - Both Eyes  2. Degenerative retinal drusen of left eye  H35.362 Color Fundus Photography Optos - OU - Both Eyes  3. Pseudophakia, both eyes  Z96.1   4. Posterior vitreous detachment of left eye  H43.812   5. Early stage nonexudative age-related macular degeneration of left eye  H35.3121     1.  History of retinal detachment right eye, stable  2.  Dry age-related macular degeneration OD stable  3.  Minor age-related maculopathy left eye observe  Ophthalmic Meds Ordered this visit:  No orders of the defined types were placed in this encounter.      Return in about 2 years (around 04/13/2022) for DILATE OU, COLOR FP, OCT.  Patient Instructions  Patient to contact the office promptly for new onset visual acuity declines or distortions    Explained the  diagnoses, plan, and follow up with the patient and they expressed understanding.  Patient expressed understanding of the importance of proper follow up care.   Alford Highland Kaeley Vinje M.D. Diseases & Surgery of the Retina and Vitreous [facility] 04/13/20     Abbreviations: M myopia (nearsighted); A astigmatism; H hyperopia (farsighted); P presbyopia; Mrx spectacle prescription;  CTL contact lenses; OD right eye; OS left eye; OU both eyes  XT exotropia; ET esotropia; PEK punctate epithelial keratitis; PEE punctate epithelial erosions; DES dry eye syndrome; MGD meibomian gland dysfunction; ATs artificial tears; PFAT's preservative free artificial tears; NSC nuclear sclerotic cataract; PSC posterior subcapsular cataract; ERM epi-retinal membrane; PVD posterior vitreous detachment; RD retinal detachment; DM diabetes mellitus; DR diabetic retinopathy; NPDR non-proliferative diabetic retinopathy; PDR proliferative diabetic retinopathy; CSME clinically significant macular edema; DME diabetic macular edema; dbh dot blot hemorrhages; CWS cotton wool spot; POAG primary open angle glaucoma; C/D cup-to-disc ratio; HVF humphrey visual field; GVF goldmann visual field; OCT optical coherence tomography; IOP intraocular pressure; BRVO Branch retinal vein occlusion; CRVO central retinal vein occlusion; CRAO central retinal artery occlusion; BRAO branch retinal artery occlusion; RT retinal tear; SB scleral buckle; PPV pars plana vitrectomy; VH Vitreous hemorrhage; PRP panretinal laser photocoagulation; IVK intravitreal kenalog; VMT vitreomacular traction; MH Macular hole;  NVD neovascularization of the disc; NVE neovascularization elsewhere; AREDS age related eye disease study; ARMD age related macular degeneration; POAG  primary open angle glaucoma; EBMD epithelial/anterior basement membrane dystrophy; ACIOL anterior chamber intraocular lens; IOL intraocular lens; PCIOL posterior chamber intraocular lens;  Phaco/IOL phacoemulsification with intraocular lens placement; [place] photorefractive keratectomy; LASIK laser assisted in situ keratomileusis; HTN hypertension; DM diabetes mellitus; COPD chronic obstructive pulmonary disease

## 2020-04-13 NOTE — Assessment & Plan Note (Signed)

## 2020-04-13 NOTE — Assessment & Plan Note (Signed)
No active disease OS 

## 2020-07-16 ENCOUNTER — Other Ambulatory Visit: Payer: Self-pay | Admitting: Family Medicine

## 2020-07-16 DIAGNOSIS — Z1231 Encounter for screening mammogram for malignant neoplasm of breast: Secondary | ICD-10-CM

## 2020-07-22 ENCOUNTER — Ambulatory Visit (INDEPENDENT_AMBULATORY_CARE_PROVIDER_SITE_OTHER): Payer: Medicare PPO | Admitting: Family Medicine

## 2020-07-22 ENCOUNTER — Encounter: Payer: Self-pay | Admitting: Family Medicine

## 2020-07-22 ENCOUNTER — Other Ambulatory Visit: Payer: Self-pay

## 2020-07-22 VITALS — BP 122/71 | HR 82 | Temp 98.7°F | Ht 59.0 in | Wt 129.0 lb

## 2020-07-22 DIAGNOSIS — E1169 Type 2 diabetes mellitus with other specified complication: Secondary | ICD-10-CM | POA: Diagnosis not present

## 2020-07-22 DIAGNOSIS — G8929 Other chronic pain: Secondary | ICD-10-CM

## 2020-07-22 DIAGNOSIS — H353121 Nonexudative age-related macular degeneration, left eye, early dry stage: Secondary | ICD-10-CM | POA: Diagnosis not present

## 2020-07-22 DIAGNOSIS — E113219 Type 2 diabetes mellitus with mild nonproliferative diabetic retinopathy with macular edema, unspecified eye: Secondary | ICD-10-CM | POA: Diagnosis not present

## 2020-07-22 DIAGNOSIS — F419 Anxiety disorder, unspecified: Secondary | ICD-10-CM | POA: Diagnosis not present

## 2020-07-22 DIAGNOSIS — E11638 Type 2 diabetes mellitus with other oral complications: Secondary | ICD-10-CM

## 2020-07-22 DIAGNOSIS — E1165 Type 2 diabetes mellitus with hyperglycemia: Secondary | ICD-10-CM | POA: Diagnosis not present

## 2020-07-22 DIAGNOSIS — M545 Low back pain, unspecified: Secondary | ICD-10-CM

## 2020-07-22 DIAGNOSIS — R002 Palpitations: Secondary | ICD-10-CM | POA: Diagnosis not present

## 2020-07-22 DIAGNOSIS — K219 Gastro-esophageal reflux disease without esophagitis: Secondary | ICD-10-CM

## 2020-07-22 DIAGNOSIS — M546 Pain in thoracic spine: Secondary | ICD-10-CM | POA: Diagnosis not present

## 2020-07-22 DIAGNOSIS — IMO0002 Reserved for concepts with insufficient information to code with codable children: Secondary | ICD-10-CM

## 2020-07-22 DIAGNOSIS — E785 Hyperlipidemia, unspecified: Secondary | ICD-10-CM

## 2020-07-22 DIAGNOSIS — I739 Peripheral vascular disease, unspecified: Secondary | ICD-10-CM

## 2020-07-22 LAB — POCT GLYCOSYLATED HEMOGLOBIN (HGB A1C)
HbA1c POC (<> result, manual entry): 6.5 % (ref 4.0–5.6)
HbA1c, POC (controlled diabetic range): 6.5 % (ref 0.0–7.0)
HbA1c, POC (prediabetic range): 6.5 % — AB (ref 5.7–6.4)
Hemoglobin A1C: 6.5 % — AB (ref 4.0–5.6)

## 2020-07-22 MED ORDER — ESCITALOPRAM OXALATE 5 MG PO TABS: 5.0000 mg | ORAL_TABLET | Freq: Every day | ORAL | 1 refills | Status: DC

## 2020-07-22 MED ORDER — PANTOPRAZOLE SODIUM 40 MG PO TBEC: 40.0000 mg | DELAYED_RELEASE_TABLET | Freq: Every day | ORAL | 3 refills | Status: DC

## 2020-07-22 MED ORDER — ATORVASTATIN CALCIUM 40 MG PO TABS: 40.0000 mg | ORAL_TABLET | Freq: Every day | ORAL | 3 refills | Status: DC

## 2020-07-22 MED ORDER — GABAPENTIN 300 MG PO CAPS: 300.0000 mg | ORAL_CAPSULE | Freq: Every day | ORAL | 1 refills | Status: DC

## 2020-07-22 MED ORDER — METFORMIN HCL 500 MG PO TABS: ORAL_TABLET | ORAL | 1 refills | Status: DC

## 2020-07-22 MED ORDER — METOPROLOL SUCCINATE ER 25 MG PO TB24: 25.0000 mg | ORAL_TABLET | Freq: Every day | ORAL | 1 refills | Status: DC

## 2020-07-22 MED ORDER — TIZANIDINE HCL 4 MG PO TABS: ORAL_TABLET | ORAL | 1 refills | Status: DC

## 2020-07-22 MED ORDER — TRAZODONE HCL 50 MG PO TABS: 25.0000 mg | ORAL_TABLET | Freq: Every evening | ORAL | 1 refills | Status: DC | PRN

## 2020-07-22 NOTE — Patient Instructions (Signed)
Your a1c looks great at 6.5 Blood pressure is perfect.

## 2020-07-22 NOTE — Progress Notes (Signed)
Patient Care Team    Relationship Specialty Notifications Start End  Natalia Leatherwood, DO PCP - General Family Medicine  07/03/18   Rankin, Alford Highland, MD Consulting Physician Ophthalmology  07/03/18   [facility]  Ophthalmology  07/03/18   Allie Bossier, MD (Inactive) Consulting Physician Obstetrics and Gynecology  07/04/18   Lennette Bihari, MD Consulting Physician Cardiology  07/04/18   Patton Salles, MD Consulting Physician Obstetrics and Gynecology  02/18/20     SUBJECTIVE Chief Complaint  Patient presents with  . Follow-up    CMC; pt is fasting     HPI: Melissa [name] is a 86 y.o. present for f/u [facility] she is fasting today Diabetes/macular degeneration/nonproliferative retinopathy:  Pt reports compliance with metformin 500 mg in the morning and 1000 mg in the afternoon. Patient denies dizziness, hyperglycemic or hypoglycemic events. Patient denies numbness, tingling in the extremities or nonhealing wounds of feet.   She is prescribed gabapentin 300 mg nightly for Thoracic back pain.  PNA series: Completed 2019 Flu shot: Completed 2021 Urine microal: UTD 03/2020 Foot exam: completed today 03/05/2019 Eye exam: Dr. Dione Booze, routine eye exams with macular degeneration. UTD. 04/13/2020. A1c: 6.5>>7.3 >>6.3>>6.4> 6.8> 6.5 collected  Today  Palpitations/hyperlipidemia/peripheral artery disease/anxiety:  She reports compliance with her metoprolol 25 mg daily, lexapro 5 mg qd. She is prescribed Lipitor 40 mg daily and takes a daily baby aspirin.  Echo Doppler study on October 03, 2017 showed an EF of 65 to 70%. There was severe mitral annular calcification with very mild functional MS by valve area  GERD: Patient reports Her GERD is well controlled.  She is compliant with Protonix 40 mg daily.  She has been on this for quite some time and takes it daily. Without medication her symptoms return.   Chronic thoracic back pain and new left jaw discomfort: Patient reports  compliance with gabapentin has been extremely helpful.  She occasionally will use the Zanaflex and but make sure not to take at the same time as her gabapentin.  She denies any oversedation.   ROS: See pertinent positives and negatives per HPI.  Patient Active Problem List   Diagnosis Date Noted  . Early stage nonexudative age-related macular degeneration of left eye 04/13/2020  . Advanced nonexudative age-related macular degeneration of right eye with subfoveal involvement 04/01/2020  . Degenerative retinal drusen of left eye 04/01/2020  . Pseudophakia, both eyes 04/01/2020  . Posterior vitreous detachment of left eye 04/01/2020  . Anxiety 03/05/2019  . Chronic right-sided low back pain without sciatica 08/03/2018  . Chronic bilateral thoracic back pain 08/03/2018  . Iron deficiency anemia 07/04/2018  . Palpitations 07/04/2018  . Peripheral arterial disease (HCC) 07/11/2017  . Varicose veins of both lower extremities 08/31/2016  . Macular degeneration 05/05/2015  . Type 2 DM mild nonproliferative retinopathy, macular edema, uncontrol (HCC) 10/02/2013  . Diabetes mellitus type 2, controlled (HCC) 03/24/2009  . Hyperlipidemia associated with type 2 diabetes mellitus (HCC) 03/24/2009  . GERD 03/24/2009    Social History   Tobacco Use  . Smoking status: Never Smoker  . Smokeless tobacco: Never Used  Substance Use Topics  . Alcohol use: No    Current Outpatient Medications:  .  aspirin 81 MG tablet, Take 81 mg by mouth daily., Disp: , Rfl:  .  atorvastatin (LIPITOR) 40 MG tablet, Take 1 tablet (40 mg total) by mouth daily., Disp: 90 tablet, Rfl: 3 .  Calcium Carbonate-Vitamin D (OYSTER  SHELL CALCIUM/D) 250-125 MG-UNIT TABS, Take by mouth., Disp: , Rfl:  .  escitalopram (LEXAPRO) 5 MG tablet, Take 1 tablet (5 mg total) by mouth daily., Disp: 90 tablet, Rfl: 1 .  gabapentin (NEURONTIN) 300 MG capsule, Take 1 capsule (300 mg total) by mouth at bedtime., Disp: 90 capsule, Rfl: 1 .   metFORMIN (GLUCOPHAGE) 500 MG tablet, 500 mg (one tablet) in the morning and 1,000 mg (2 tablets) at night, Disp: 270 tablet, Rfl: 1 .  metoprolol succinate (TOPROL XL) 25 MG 24 hr tablet, Take 1 tablet (25 mg total) by mouth daily., Disp: 90 tablet, Rfl: 1 .  Multiple Vitamin (MULTIVITAMIN) tablet, Take 1 tablet by mouth daily., Disp: , Rfl:  .  pantoprazole (PROTONIX) 40 MG tablet, Take 1 tablet (40 mg total) by mouth daily., Disp: 90 tablet, Rfl: 3 .  tiZANidine (ZANAFLEX) 4 MG tablet, TAKE ONE TABLET BY MOUTH AT BEDTIME FOR MUSCLE SPASM PRN, Disp: 90 tablet, Rfl: 1 .  traZODone (DESYREL) 50 MG tablet, Take 0.5-1 tablets (25-50 mg total) by mouth at bedtime as needed for sleep., Disp: 90 tablet, Rfl: 1  No Known Allergies  OBJECTIVE: BP 122/71   Pulse 82   Temp 98.7 F (37.1 C) (Oral)   Ht 4\' 11"  (1.499 m)   Wt 129 lb (58.5 kg)   SpO2 98%   BMI 26.05 kg/m  Gen: Afebrile. No acute distress. Nontoxic. Pleasant female.  HENT: AT. [place].  Eyes:Pupils Equal Round Reactive to light, Extraocular movements intact,  Conjunctiva without redness, discharge or icterus. Neck/lymp/endocrine: Supple,no lymphadenopathy, no thyromegaly CV: RRR no murmur, no edema, +2/4 P posterior tibialis pulses Chest: CTAB, no wheeze or crackles Abd: Soft. NTND. BS present. no Masses palpated.  Skin: no rashes, purpura or petechiae.  Neuro:  Normal gait. PERLA. EOMi. Alert. Oriented x3 Psych: Normal affect, dress and demeanor. Normal speech. Normal thought content and judgment.   No results found for this or any previous visit (from the past 24 hour(s)).  ASSESSMENT AND PLAN: Melissa [name] is a 86 y.o. female present for  Controlled type 2 diabetes mellitus (HCC)/Type 2 DM mild nonproliferative retinopathy, macular edema, uncontrol (HCC) Stable.  continue metformin 1500 mg total a day- may divide. PNA series: Completed 2019 Flu shot: Completed 2021 Urine microal: UTD 03/2020 Foot exam: completed today  03/05/2019 Eye exam: Dr. 03/07/2019, routine eye exams with macular degeneration. UTD. 04/13/2020. A1c: 6.5>>7.3 >>6.3>>6.4> 6.8> 6.5 collected  today  Hyperlipidemia associated with type 2 diabetes mellitus (HCC)/Peripheral arterial disease (HCC)/Palpitations/IDA/CKD 3 - Continue to  avoid caffeine, stimulants-her vice is excess chocolate. -continue  metoprolol 25 mg QD - continue   atorvastatin 40 mg daily. - continue  baby aspirin daily. - avoid NSAIDS; renal dose.   Anxiety/insomnia -stable.  - continue lexapro 5 mg QD-tolerating -continue Trazodone 25-50 mg nightly  Gastroesophageal reflux disease without esophagitis Stable.  continue  Protonix -Encouraged her to try Biotene 2 times a day.    Fecal smearing/hemorrhoids: Evaluated by Dr. 04/15/2020 Bladder prolapse: Referred to gyn last appt  Chronic thoracic back pain and new left jaw discomfort: Stable.  -continue  gabapentin nightly  -continue  Zanaflex prn  Return in about 5-6 months     Orders Placed This Encounter  Procedures  . POCT HgB A1C   No orders of the defined types were placed in this encounter.     Kinnie Scales, DO 07/22/2020

## 2020-07-23 ENCOUNTER — Telehealth: Payer: Self-pay | Admitting: Family Medicine

## 2020-07-23 NOTE — Telephone Encounter (Signed)
Patient advised and voiced understanding.  

## 2020-07-23 NOTE — Telephone Encounter (Signed)
Please advise of the cream pt is referring to

## 2020-07-23 NOTE — Telephone Encounter (Signed)
Patient states when she was here for her appt yesterday, Dr. Claiborne Billings mentioned a foot cream but did not write it down. Patient would like know what it is. Please call to advise.

## 2020-07-23 NOTE — Telephone Encounter (Signed)
Bag Balm- comes in a green square tin (any pharmacy, [facility] and even [facility])

## 2020-07-28 ENCOUNTER — Telehealth: Payer: Self-pay | Admitting: Cardiovascular Disease

## 2020-07-28 NOTE — Progress Notes (Signed)
Cardiology Office Note   Date:  07/31/2020   ID:  Melissa [name], DOB 23-Apr-1935, MRN [id number]  PCP:  [name], DO  Cardiologist: Dr. Claiborne Billings CC: Follow up   History of Present Illness: Melissa [name] is a 86 y.o. female who presents for ongoing assessment and management of chest discomfort, palpitations when she becomes anxious, hyperlipidemia, other history includes diabetes and GERD.  When last seen by Dr. Claiborne Billings on 01/03/2018 he discussed results of her echo which revealed EF of 65 to 70% with severe mitral annular calcification and very mild functional mitral stenosis by valve area.  He also discussed with her elevated cholesterol level of 285 with an LDL cholesterol of 173.  She had been on atorvastatin 40 mg.    At the time of the office visit, due to recurrence of palpitations on lower dose of metoprolol which was reduced by primary care, Dr. Claiborne Billings recommended that she take Toprol 12.5 mg twice daily to see if she tolerates this better.   She called our office on 07/28/2020 with complaints of chest discomfort which had developed over the last month.  She describes it is coming and going.  She described it happening when she gets tired, stating the pain is located around the "bone in her chest".  She is here for evaluation of symptoms.   She is able to touch the place in her chest where she feels pain.  She was in [employer] and got hot, felt the pain in her breast bone and decided to go and sit outside in her car.  The feeling passed when she went outside.  She was not dizzy, diaphoretic, or having nausea or vomiting.   Past Medical History:  Diagnosis Date  . Burning tongue syndrome 11/11/2015  . Colon polyps   . Cystocele with prolapse 2019   Dr. Hulan Fray- Monitor for now.   . Detached retina, right   . Diabetes mellitus   . Diaphragmatic hernia   . GERD (gastroesophageal reflux disease)   . Hyperlipidemia   . Insomnia   . Macular degeneration     Past Surgical History:  Procedure  Laterality Date  . ABDOMINAL HYSTERECTOMY    . FOOT SURGERY    . OOPHORECTOMY     Rt.ovary removed with Hyst  . RETINAL DETACHMENT SURGERY    . TONSILLECTOMY       Current Outpatient Medications  Medication Sig Dispense Refill  . aspirin 81 MG tablet Take 81 mg by mouth daily.    . Calcium Carbonate-Vitamin D (OYSTER SHELL CALCIUM/D) 250-125 MG-UNIT TABS Take by mouth.    . escitalopram (LEXAPRO) 5 MG tablet Take 1 tablet (5 mg total) by mouth daily. 90 tablet 1  . gabapentin (NEURONTIN) 300 MG capsule Take 1 capsule (300 mg total) by mouth at bedtime. 90 capsule 1  . metFORMIN (GLUCOPHAGE) 500 MG tablet 500 mg (one tablet) in the morning and 1,000 mg (2 tablets) at night 270 tablet 1  . Multiple Vitamin (MULTIVITAMIN) tablet Take 1 tablet by mouth daily.    . pantoprazole (PROTONIX) 40 MG tablet Take 1 tablet (40 mg total) by mouth daily. 90 tablet 3  . tiZANidine (ZANAFLEX) 4 MG tablet TAKE ONE TABLET BY MOUTH AT BEDTIME FOR MUSCLE SPASM PRN 90 tablet 1  . traZODone (DESYREL) 50 MG tablet Take 0.5-1 tablets (25-50 mg total) by mouth at bedtime as needed for sleep. 90 tablet 1  . atorvastatin (LIPITOR) 40 MG tablet Take 1 tablet (40 mg  total) by mouth daily. 90 tablet 3  . metoprolol succinate (TOPROL XL) 25 MG 24 hr tablet Take 1 tablet (25 mg total) by mouth daily. 90 tablet 3   No current facility-administered medications for this visit.    Allergies:   Patient has no known allergies.    Social History:  The patient  reports that she has never smoked. She has never used smokeless tobacco. She reports that she does not drink alcohol and does not use drugs.   Family History:  The patient's family history includes Breast cancer in her mother and another family member; CAD in her father; Cancer in her maternal grandmother and paternal grandmother; Heart disease in an other family member.    ROS: All other systems are reviewed and negative. Unless otherwise mentioned in H&P     PHYSICAL EXAM: VS:  BP 110/66   Pulse (!) 108   Ht _0  (1.499 m)   Wt 127 lb 6.4 oz (57.8 kg)   SpO2 92%   BMI 25.73 kg/m  , BMI Body mass index is 25.73 kg/m. GEN: Well nourished, well developed, in no acute distress HEENT: normal Neck: no JVD, carotid bruits, or masses Cardiac: RRR,tachyardic; no murmurs, rubs, or gallops,no edema  Respiratory:  Clear to auscultation bilaterally, normal work of breathing GI: soft, nontender, nondistended, + BS MS: no deformity or atrophy Skin: warm and dry, no rash Neuro:  Strength and sensation are intact Psych: euthymic mood, full affect   EKG: Sinus tachycardia rate of 108 bpm. RBBB which is new compared to previous EKG in 01/03/2018 (personally reviewed).    Recent Labs: 02/11/2020: ALT 11; BUN 18; Creatinine, Ser 1.01; Hemoglobin 13.1; Platelets 261.0; Potassium 4.8; Sodium 138; TSH 1.76    Lipid Panel    Component Value Date/Time   CHOL 155 02/11/2020 1000   CHOL 176 12/08/2017 1038   TRIG 140.0 02/11/2020 1000   HDL 66.80 02/11/2020 1000   HDL 67 12/08/2017 1038   CHOLHDL 2 02/11/2020 1000   VLDL 28.0 02/11/2020 1000   LDLCALC 60 02/11/2020 1000   LDLCALC 87 12/08/2017 1038   LDLDIRECT 112.6 01/17/2012 1051      Wt Readings from Last 3 Encounters:  07/31/20 127 lb 6.4 oz (57.8 kg)  07/22/20 129 lb (58.5 kg)  02/18/20 128 lb (58.1 kg)      Other studies Reviewed:  Echo 10/03/2017 Left ventricle: The cavity size was normal. Wall thickness was  increased in a pattern of moderate LVH. Systolic function was  vigorous. The estimated ejection fraction was in the range of 65%  to 70%. Wall motion was normal; there were no regional wall  motion abnormalities. The study is not technically sufficient to  allow evaluation of LV diastolic function.  - Mitral valve: Mild functional MS by valve area with severe MAC  encroaching on basal and mid leaflets. Severely calcified  annulus. Moderately thickened,  moderately calcified leaflets .  The findings are consistent with mild stenosis.  - Atrial septum: No defect or patent foramen ovale was identified.   ASSESSMENT AND PLAN:  1.  Chest pain: Uncertain if this is musculoskeletal or cardiac in etiology. This occurs intermittently and not always with exertion, She remain active but is slowing down some due to her age. She still drives and goes to the grocery store. Lives alone. She can point to the spot on her chest bone where she feels the pain. It is somewhat reproducible.   I have discussed with the patient  the options for testing Also if there are positive tests that may led to cardiac cath with intervention or need for bypass surgery, would this be something she is adamantly.   against. She refuses invasive procedures and prefers to have medical management only.   I am reluctant to add a dose of nitrates due to hypotension. We have discussed this and she wants to keep her medication regimen the same as the pain doesn't bother here enough yet.   2. Resting tachycardia: I do not feel comfortable going up on metoprolol for better HR control with soft BP.  Do not want to invite orthostatic issues at her age. Will follow. May need to discus further on follow up with Dr. Claiborne Billings. I have advised her to keep hydrated and to avoid caffeine.   3. Type II diabetes: Followed by PCP.  Current medicines are reviewed at length with the patient today.  I have spent 30 mins dedicated to the care of this patient on the date of this encounter to include pre-visit review of records, assessment, management and diagnostic testing,with shared decision making.  Labs/ tests ordered today include: None   Phill Myron. West Pugh, ANP, AACC   07/31/2020 3:43 PM    [facility] [place] [address] Office [phone] Fax [phone]  Notice: This dictation was prepared with Dragon dictation along with smaller phrase technology. Any  transcriptional errors that result from this process are unintentional and may not be corrected upon review.

## 2020-07-28 NOTE — Telephone Encounter (Signed)
Spoke to patient she stated she has been having chest pain and sob off and on the past 1 month.Advised to keep appointment already scheduled with Joni Reining NP 2/11 at 2:15 pm.Advised to go to ED if needed.

## 2020-07-28 NOTE — Telephone Encounter (Signed)
Pt c/o of Chest Pain: STAT if CP now or developed within 24 hours  1. Are you having CP right now? no  2. Are you experiencing any other symptoms (ex. SOB, nausea, vomiting, sweating)? No, gets winded when going up steps  3. How long have you been experiencing CP? About a month or so  4. Is your CP continuous or coming and going? Comes and goes  5. Have you taken Nitroglycerin? no ? Patient states she has been having chest pain for about a month or so. She states she is not having symptoms now. She states it happens more when she gets tired. She states the pain happens around the bone in her chest. She has an appointment 07/31/2020

## 2020-07-31 ENCOUNTER — Other Ambulatory Visit: Payer: Self-pay

## 2020-07-31 ENCOUNTER — Ambulatory Visit: Payer: Medicare PPO | Admitting: Adult Health

## 2020-07-31 ENCOUNTER — Encounter: Payer: Self-pay | Admitting: Adult Health

## 2020-07-31 VITALS — BP 110/66 | HR 108 | Ht 59.0 in | Wt 127.4 lb

## 2020-07-31 DIAGNOSIS — R000 Tachycardia, unspecified: Secondary | ICD-10-CM

## 2020-07-31 DIAGNOSIS — R0789 Other chest pain: Secondary | ICD-10-CM

## 2020-07-31 DIAGNOSIS — IMO0002 Reserved for concepts with insufficient information to code with codable children: Secondary | ICD-10-CM

## 2020-07-31 DIAGNOSIS — E1165 Type 2 diabetes mellitus with hyperglycemia: Secondary | ICD-10-CM

## 2020-07-31 DIAGNOSIS — E113219 Type 2 diabetes mellitus with mild nonproliferative diabetic retinopathy with macular edema, unspecified eye: Secondary | ICD-10-CM | POA: Diagnosis not present

## 2020-07-31 MED ORDER — ATORVASTATIN CALCIUM 40 MG PO TABS: 40.0000 mg | ORAL_TABLET | Freq: Every day | ORAL | 3 refills | Status: DC

## 2020-07-31 MED ORDER — METOPROLOL SUCCINATE ER 25 MG PO TB24: 25.0000 mg | ORAL_TABLET | Freq: Every day | ORAL | 3 refills | Status: DC

## 2020-07-31 NOTE — Patient Instructions (Signed)
Medication Instructions:  Continue current medications  *If you need a refill on your cardiac medications before your next appointment, please call your pharmacy*   Lab Work: None Ordered   Testing/Procedures: None Ordered   Follow-Up: At [facility], you and your health needs are our priority.  As part of our continuing mission to provide you with exceptional heart care, we have created designated Provider Care Teams.  These Care Teams include your primary Cardiologist (physician) and Advanced Practice Providers (APPs -  Physician Assistants and Nurse Practitioners) who all work together to provide you with the care you need, when you need it.  We recommend signing up for the patient portal called "MyChart".  Sign up information is provided on this After Visit Summary.  MyChart is used to connect with patients for Virtual Visits (Telemedicine).  Patients are able to view lab/test results, encounter notes, upcoming appointments, etc.  Non-urgent messages can be sent to your provider as well.   To learn more about what you can do with MyChart, go to [email or web address].    Your next appointment:   6 month(s)  The format for your next appointment:   In Person  Provider:   You may see Thomas Kelly, MD or one of the following Advanced Practice Providers on your designated Care Team:    Hao Meng, PA-C  Angela Duke, PA-C or   Krista Kroeger, PA-C     

## 2020-08-10 NOTE — Addendum Note (Signed)
Addended by: Brunetta Genera on: 08/10/2020 02:01 PM   Modules accepted: Orders

## 2020-08-26 ENCOUNTER — Ambulatory Visit (INDEPENDENT_AMBULATORY_CARE_PROVIDER_SITE_OTHER): Payer: Medicare PPO

## 2020-08-26 VITALS — Ht 59.0 in | Wt 127.0 lb

## 2020-08-26 DIAGNOSIS — Z0000 Encounter for general adult medical examination without abnormal findings: Secondary | ICD-10-CM | POA: Diagnosis not present

## 2020-08-26 NOTE — Patient Instructions (Signed)
Ms. Melissa [name] , Thank you for taking time to complete your Medicare Wellness Visit. I appreciate your ongoing commitment to your health goals. Please review the following plan we discussed and let me know if I can assist you in the future.   Screening recommendations/referrals: Colonoscopy: No longer required Mammogram: Scheduled for 3/15/2022f Bone Density: Declined today. Please call the office to schedule if you change your mind. Recommended yearly ophthalmology/optometry visit for glaucoma screening and checkup Recommended yearly dental visit for hygiene and checkup  Vaccinations: Influenza vaccine: Up to date Pneumococcal vaccine: Completed vaccines Tdap vaccine: Discuss with pharmacy Shingles vaccine: Completed vaccines   Covid-19:Completed vaccines  Advanced directives: Please bring a copy for your chart  Conditions/risks identified: See problem list  Next appointment: Follow up in one year for your annual wellness visit    Preventive Care 65 Years and Older, Female Preventive care refers to lifestyle choices and visits with your health care provider that can promote health and wellness. What does preventive care include?  A yearly physical exam. This is also called an annual well check.  Dental exams once or twice a year.  Routine eye exams. Ask your health care provider how often you should have your eyes checked.  Personal lifestyle choices, including:  Daily care of your teeth and gums.  Regular physical activity.  Eating a healthy diet.  Avoiding tobacco and drug use.  Limiting alcohol use.  Practicing safe sex.  Taking low-dose aspirin every day.  Taking vitamin and mineral supplements as recommended by your health care provider. What happens during an annual well check? The services and screenings done by your health care provider during your annual well check will depend on your age, overall health, lifestyle risk factors, and family history of  disease. Counseling  Your health care provider may ask you questions about your:  Alcohol use.  Tobacco use.  Drug use.  Emotional well-being.  Home and relationship well-being.  Sexual activity.  Eating habits.  History of falls.  Memory and ability to understand (cognition).  Work and work Astronomer.  Reproductive health. Screening  You may have the following tests or measurements:  Height, weight, and BMI.  Blood pressure.  Lipid and cholesterol levels. These may be checked every 5 years, or more frequently if you are over 9 years old.  Skin check.  Lung cancer screening. You may have this screening every year starting at age 35 if you have a 30-pack-year history of smoking and currently smoke or have quit within the past 15 years.  Fecal occult blood test (FOBT) of the stool. You may have this test every year starting at age 85.  Flexible sigmoidoscopy or colonoscopy. You may have a sigmoidoscopy every 5 years or a colonoscopy every 10 years starting at age 3.  Hepatitis C blood test.  Hepatitis B blood test.  Sexually transmitted disease (STD) testing.  Diabetes screening. This is done by checking your blood sugar (glucose) after you have not eaten for a while (fasting). You may have this done every 1-3 years.  Bone density scan. This is done to screen for osteoporosis. You may have this done starting at age 38.  Mammogram. This may be done every 1-2 years. Talk to your health care provider about how often you should have regular mammograms. Talk with your health care provider about your test results, treatment options, and if necessary, the need for more tests. Vaccines  Your health care provider may recommend certain vaccines, such as:  Influenza  vaccine. This is recommended every year.  Tetanus, diphtheria, and acellular pertussis (Tdap, Td) vaccine. You may need a Td booster every 10 years.  Zoster vaccine. You may need this after age  23.  Pneumococcal 13-valent conjugate (PCV13) vaccine. One dose is recommended after age 1.  Pneumococcal polysaccharide (PPSV23) vaccine. One dose is recommended after age 74. Talk to your health care provider about which screenings and vaccines you need and how often you need them. This information is not intended to replace advice given to you by your health care provider. Make sure you discuss any questions you have with your health care provider. Document Released: 07/03/2015 Document Revised: 02/24/2016 Document Reviewed: 04/07/2015 Elsevier Interactive Patient Education  2017 [place] Prevention in the Home Falls can cause injuries. They can happen to people of all ages. There are many things you can do to make your home safe and to help prevent falls. What can I do on the outside of my home?  Regularly fix the edges of walkways and driveways and fix any cracks.  Remove anything that might make you trip as you walk through a door, such as a raised step or threshold.  Trim any bushes or trees on the path to your home.  Use bright outdoor lighting.  Clear any walking paths of anything that might make someone trip, such as rocks or tools.  Regularly check to see if handrails are loose or broken. Make sure that both sides of any steps have handrails.  Any raised decks and porches should have guardrails on the edges.  Have any leaves, snow, or ice cleared regularly.  Use sand or salt on walking paths during winter.  Clean up any spills in your garage right away. This includes oil or grease spills. What can I do in the bathroom?  Use night lights.  Install grab bars by the toilet and in the tub and shower. Do not use towel bars as grab bars.  Use non-skid mats or decals in the tub or shower.  If you need to sit down in the shower, use a plastic, non-slip stool.  Keep the floor dry. Clean up any water that spills on the floor as soon as it happens.  Remove  soap buildup in the tub or shower regularly.  Attach bath mats securely with double-sided non-slip rug tape.  Do not have throw rugs and other things on the floor that can make you trip. What can I do in the bedroom?  Use night lights.  Make sure that you have a light by your bed that is easy to reach.  Do not use any sheets or blankets that are too big for your bed. They should not hang down onto the floor.  Have a firm chair that has side arms. You can use this for support while you get dressed.  Do not have throw rugs and other things on the floor that can make you trip. What can I do in the kitchen?  Clean up any spills right away.  Avoid walking on wet floors.  Keep items that you use a lot in easy-to-reach places.  If you need to reach something above you, use a strong step stool that has a grab bar.  Keep electrical cords out of the way.  Do not use floor polish or wax that makes floors slippery. If you must use wax, use non-skid floor wax.  Do not have throw rugs and other things on the floor that can  make you trip. What can I do with my stairs?  Do not leave any items on the stairs.  Make sure that there are handrails on both sides of the stairs and use them. Fix handrails that are broken or loose. Make sure that handrails are as long as the stairways.  Check any carpeting to make sure that it is firmly attached to the stairs. Fix any carpet that is loose or worn.  Avoid having throw rugs at the top or bottom of the stairs. If you do have throw rugs, attach them to the floor with carpet tape.  Make sure that you have a light switch at the top of the stairs and the bottom of the stairs. If you do not have them, ask someone to add them for you. What else can I do to help prevent falls?  Wear shoes that:  Do not have high heels.  Have rubber bottoms.  Are comfortable and fit you well.  Are closed at the toe. Do not wear sandals.  If you use a  stepladder:  Make sure that it is fully opened. Do not climb a closed stepladder.  Make sure that both sides of the stepladder are locked into place.  Ask someone to hold it for you, if possible.  Clearly mark and make sure that you can see:  Any grab bars or handrails.  First and last steps.  Where the edge of each step is.  Use tools that help you move around (mobility aids) if they are needed. These include:  Canes.  Walkers.  Scooters.  Crutches.  Turn on the lights when you go into a dark area. Replace any light bulbs as soon as they burn out.  Set up your furniture so you have a clear path. Avoid moving your furniture around.  If any of your floors are uneven, fix them.  If there are any pets around you, be aware of where they are.  Review your medicines with your doctor. Some medicines can make you feel dizzy. This can increase your chance of falling. Ask your doctor what other things that you can do to help prevent falls. This information is not intended to replace advice given to you by your health care provider. Make sure you discuss any questions you have with your health care provider. Document Released: 04/02/2009 Document Revised: 11/12/2015 Document Reviewed: 07/11/2014 Elsevier Interactive Patient Education  2017 [employer].

## 2020-08-26 NOTE — Progress Notes (Signed)
Subjective:   Melissa [name] is a 86 y.o. female who presents for Medicare Annual (Subsequent) preventive examination.  I connected with Melissa [name] today by telephone and verified that I am speaking with the correct person using two identifiers. Location patient: home Location provider: work Persons participating in the virtual visit: patient, Engineer, civil (consulting).    I discussed the limitations, risks, security and privacy concerns of performing an evaluation and management service by telephone and the availability of in person appointments. I also discussed with the patient that there may be a patient responsible charge related to this service. The patient expressed understanding and verbally consented to this telephonic visit.    Interactive audio and video telecommunications were attempted between this provider and patient, however failed, due to patient having technical difficulties OR patient did not have access to video capability.  We continued and completed visit with audio only.  Some vital signs may be absent or patient reported.   Time Spent with patient on telephone encounter: 20 minutes   Review of Systems     Cardiac Risk Factors include: advanced age (>20men, >39 women);diabetes mellitus;dyslipidemia     Objective:    Today's Vitals   08/26/20 1244  Weight: 127 lb (57.6 kg)  Height: 4\' 11"  (1.499 m)   Body mass index is 25.65 kg/m.  Advanced Directives 08/26/2020 08/31/2016  Does Patient Have a Medical Advance Directive? Yes Yes  Type of 09/02/2016 of [place];Living will -  Copy of Healthcare Power of Attorney in Chart? No - copy requested -    Current Medications (verified) Outpatient Encounter Medications as of 08/26/2020  Medication Sig  . aspirin 81 MG tablet Take 81 mg by mouth daily.  10/26/2020 atorvastatin (LIPITOR) 40 MG tablet Take 1 tablet (40 mg total) by mouth daily.  . Calcium Carbonate-Vitamin D (OYSTER SHELL CALCIUM/D) 250-125 MG-UNIT TABS Take  by mouth.  . escitalopram (LEXAPRO) 5 MG tablet Take 1 tablet (5 mg total) by mouth daily.  Marland Kitchen gabapentin (NEURONTIN) 300 MG capsule Take 1 capsule (300 mg total) by mouth at bedtime.  . metFORMIN (GLUCOPHAGE) 500 MG tablet 500 mg (one tablet) in the morning and 1,000 mg (2 tablets) at night  . metoprolol succinate (TOPROL XL) 25 MG 24 hr tablet Take 1 tablet (25 mg total) by mouth daily.  . Multiple Vitamin (MULTIVITAMIN) tablet Take 1 tablet by mouth daily.  . pantoprazole (PROTONIX) 40 MG tablet Take 1 tablet (40 mg total) by mouth daily.  Marland Kitchen tiZANidine (ZANAFLEX) 4 MG tablet TAKE ONE TABLET BY MOUTH AT BEDTIME FOR MUSCLE SPASM PRN  . traZODone (DESYREL) 50 MG tablet Take 0.5-1 tablets (25-50 mg total) by mouth at bedtime as needed for sleep.   No facility-administered encounter medications on file as of 08/26/2020.    Allergies (verified) Patient has no known allergies.   History: Past Medical History:  Diagnosis Date  . Burning tongue syndrome 11/11/2015  . Colon polyps   . Cystocele with prolapse 2019   Dr. 2020- Monitor for now.   . Detached retina, right   . Diabetes mellitus   . Diaphragmatic hernia   . GERD (gastroesophageal reflux disease)   . Hyperlipidemia   . Insomnia   . Macular degeneration    Past Surgical History:  Procedure Laterality Date  . ABDOMINAL HYSTERECTOMY    . FOOT SURGERY    . OOPHORECTOMY     Rt.ovary removed with Hyst  . RETINAL DETACHMENT SURGERY    . TONSILLECTOMY  Family History  Problem Relation Age of Onset  . Heart disease Other   . Breast cancer Other   . Breast cancer Mother        metastatic--to bone  . CAD Father   . Cancer Maternal Grandmother        unknown  . Cancer Paternal Grandmother        colon   Social History   Socioeconomic History  . Marital status: Widowed    Spouse name: Not on file  . Number of children: Not on file  . Years of education: Not on file  . Highest education level: Not on file  Occupational  History  . Not on file  Tobacco Use  . Smoking status: Never Smoker  . Smokeless tobacco: Never Used  Vaping Use  . Vaping Use: Never used  Substance and Sexual Activity  . Alcohol use: No  . Drug use: No  . Sexual activity: Not Currently    Birth control/protection: Surgical    Comment: Hyst--Lt.ovary remains  Other Topics Concern  . Not on file  Social History Narrative   Marital status/children/pets: Widowed.   Education/employment: 12th grade education.  Retired.   Safety:      -smoke alarm in the home:Yes     - wears seatbelt: Yes     - Feels safe in their relationships: Yes   Social Determinants of Health   Financial Resource Strain: Low Risk   . Difficulty of Paying Living Expenses: Not hard at all  Food Insecurity: No Food Insecurity  . Worried About Programme researcher, broadcasting/film/video in the Last Year: Never true  . Ran Out of Food in the Last Year: Never true  Transportation Needs: No Transportation Needs  . Lack of Transportation (Medical): No  . Lack of Transportation (Non-Medical): No  Physical Activity: Insufficiently Active  . Days of Exercise per Week: 7 days  . Minutes of Exercise per Session: 20 min  Stress: No Stress Concern Present  . Feeling of Stress : Not at all  Social Connections: Moderately Integrated  . Frequency of Communication with Friends and Family: More than three times a week  . Frequency of Social Gatherings with Friends and Family: More than three times a week  . Attends Religious Services: More than 4 times per year  . Active Member of Clubs or Organizations: Yes  . Attends Banker Meetings: 1 to 4 times per year  . Marital Status: Widowed    Tobacco Counseling Counseling given: Not Answered   Clinical Intake:  Pre-visit preparation completed: Yes  Pain : No/denies pain     Nutritional Status: BMI 25 -29 Overweight Nutritional Risks: None Diabetes: Yes CBG done?: No Did pt. bring in CBG monitor from home?: No  How  often do you need to have someone help you when you read instructions, pamphlets, or other written materials from your doctor or pharmacy?: 1 - Never  Diabetes:  Is the patient diabetic?  Yes  If diabetic, was a CBG obtained today?  No  Did the patient bring in their glucometer from home?  No phone visit How often do you monitor your CBG's? never.   Financial Strains and Diabetes Management:  Are you having any financial strains with the device, your supplies or your medication? No .  Does the patient want to be seen by Chronic Care Management for management of their diabetes?  No  Would the patient like to be referred to a Nutritionist or for Diabetic  Management?  No   Diabetic Exams:  Diabetic Eye Exam: Completed 04/13/2020.   Diabetic Foot Exam: Completed 07/22/2020.  Interpreter Needed?: No  Information entered by :: Thomasenia SalesMartha Stanley LPN   Activities of Daily Living In your present state of health, do you have any difficulty performing the following activities: 08/26/2020  Hearing? N  Vision? N  Difficulty concentrating or making decisions? N  Walking or climbing stairs? N  Dressing or bathing? N  Doing errands, shopping? N  Preparing Food and eating ? N  Using the Toilet? N  In the past six months, have you accidently leaked urine? N  Do you have problems with loss of bowel control? N  Managing your Medications? N  Managing your Finances? N  Housekeeping or managing your Housekeeping? N  Some recent data might be hidden    Patient Care Team: Natalia LeatherwoodKuneff, Renee A, DO as PCP - General (Family Medicine) Luciana Axeankin, Alford HighlandGary A, MD as Consulting Physician (Ophthalmology) [facility] ([facility]) Allie Bossierove, Myra C, MD (Inactive) as Consulting Physician (Obstetrics and Gynecology) Lennette BihariKelly, Thomas A, MD as Consulting Physician (Cardiology) Ardell IsaacsAmundson C Silva, Forrestine HimBrook E, MD as Consulting Physician (Obstetrics and Gynecology)  Indicate any recent Medical Services you may have  received from other than [facility] providers in the past year (date may be approximate).     Assessment:   This is a routine wellness examination for Johnny BridgeMartha.  Hearing/Vision screen  Hearing Screening   125Hz  250Hz  500Hz  1000Hz  2000Hz  3000Hz  4000Hz  6000Hz  8000Hz   Right ear:           Left ear:           Comments: No issues  Vision Screening Comments: Wears glasses Last eye  exam-03/2020-Dr. Rankin  Dietary issues and exercise activities discussed: Current Exercise Habits: Home exercise routine, Type of exercise: walking;Other - see comments (yardwork someimes), Time (Minutes): 20, Frequency (Times/Week): 7, Weekly Exercise (Minutes/Week): 140, Exercise limited by: None identified  Goals    . patient     Keep maintaining your health       Depression Screen PHQ 2/9 Scores 08/26/2020 07/22/2020 02/11/2020 07/04/2018 01/03/2018 09/05/2017 08/31/2016  PHQ - 2 Score 0 0 0 0 0 0 0    Fall Risk Fall Risk  08/26/2020 07/22/2020 02/11/2020 07/04/2018 01/03/2018  Falls in the past year? 0 0 0 0 Yes  Number falls in past yr: 0 0 0 0 1  Injury with Fall? 0 0 0 0 No  Risk for fall due to : - - - - Impaired balance/gait  Follow up Falls prevention discussed - Falls evaluation completed Falls evaluation completed Education provided;Falls prevention discussed    FALL RISK PREVENTION PERTAINING TO THE HOME:  Any stairs in or around the home? Yes  If so, are there any without handrails? No  Home free of loose throw rugs in walkways, pet beds, electrical cords, etc? Yes  Adequate lighting in your home to reduce risk of falls? Yes   ASSISTIVE DEVICES UTILIZED TO PREVENT FALLS:  Life alert? No  Use of a cane, walker or w/c? No  Grab bars in the bathroom? No  Shower chair or bench in shower? No  Elevated toilet seat or a handicapped toilet? No   TIMED UP AND GO:  Was the test performed? No . Phone visit   Cognitive Function:Normal cognitive status assessed by  this Nurse Health Advisor. No abnormalities  found.   MMSE - Mini Mental State Exam 08/31/2016  Not completed: (No Data)  Immunizations Immunization History  Administered Date(s) Administered  . Fluad Quad(high Dose 65+) 03/06/2019, 04/15/2020  . Influenza Split 04/18/2011, 04/05/2013  . Influenza Whole 03/24/2009, 04/17/2014  . Influenza, High Dose Seasonal PF 03/20/2016, 03/06/2019  . Influenza, Seasonal, Injecte, Preservative Fre 04/07/2015  . Influenza,inj,Quad PF,6+ Mos 04/16/2018  . Influenza-Unspecified 04/18/2011, 03/20/2013, 04/05/2013, 04/07/2015, 04/16/2018, 04/03/2019  . Meningococcal Conjugate 04/05/2017  . Moderna Sars-Covid-2 Vaccination 08/21/2019, 09/18/2019, 04/15/2020  . Pneumococcal Conjugate-13 10/02/2013, 10/02/2013, 01/03/2018  . Pneumococcal Polysaccharide-23 06/21/2003, 05/21/2019  . Zoster 08/16/2014  . Zoster Recombinat (Shingrix) 05/21/2019    TDAP status: Due, Education has been provided regarding the importance of this vaccine. Advised may receive this vaccine at local pharmacy or Health Dept. Aware to provide a copy of the vaccination record if obtained from local pharmacy or Health Dept. Verbalized acceptance and understanding.  Flu Vaccine status: Up to date  Pneumococcal vaccine status: Up to date  Covid-19 vaccine status: Completed vaccines  Qualifies for Shingles Vaccine? No   Zostavax completed Yes   Shingrix Completed?: Yes  Screening Tests Health Maintenance  Topic Date Due  . TETANUS/TDAP  08/29/2020 (Originally 10/26/1953)  . HEMOGLOBIN A1C  01/19/2021  . URINE MICROALBUMIN  02/10/2021  . OPHTHALMOLOGY EXAM  04/13/2021  . FOOT EXAM  07/22/2021  . MAMMOGRAM  08/06/2021  . INFLUENZA VACCINE  Completed  . COVID-19 Vaccine  Completed  . PNA vac Low Risk Adult  Completed  . HPV VACCINES  Aged Out  . DEXA SCAN  Discontinued    Health Maintenance  There are no preventive care reminders to display for this patient.  Colorectal cancer screening: No longer required.    Mammogram status: Scheduled for 09/01/2020  Bone Density status: Due- Declined  Lung Cancer Screening: (Low Dose CT Chest recommended if Age 41-80 years, 30 pack-year currently smoking OR have quit w/in 15years.) does not qualify.    Additional Screening:  Hepatitis C Screening: does not qualify  Vision Screening: Recommended annual ophthalmology exams for early detection of glaucoma and other disorders of the eye. Is the patient up to date with their annual eye exam?  Yes  Who is the provider or what is the name of the office in which the patient attends annual eye exams? Dr. Luciana Axe   Dental Screening: Recommended annual dental exams for proper oral hygiene  Community Resource Referral / Chronic Care Management: CRR required this visit?  No   CCM required this visit?  No      Plan:     I have personally reviewed and noted the following in the patient's chart:   . Medical and social history . Use of alcohol, tobacco or illicit drugs  . Current medications and supplements . Functional ability and status . Nutritional status . Physical activity . Advanced directives . List of other physicians . Hospitalizations, surgeries, and ER visits in previous 12 months . Vitals . Screenings to include cognitive, depression, and falls . Referrals and appointments  In addition, I have reviewed and discussed with patient certain preventive protocols, quality metrics, and best practice recommendations. A written personalized care plan for preventive services as well as general preventive health recommendations were provided to patient.   Due to this being a telephonic visit, the after visit summary with patients personalized plan was offered to patient via mail or my-chart. Patient declined at this time.   Roanna Raider, LPN   12/23/2261  Nurse Health Advisor  Nurse Notes: None

## 2020-08-29 DIAGNOSIS — R490 Dysphonia: Secondary | ICD-10-CM | POA: Diagnosis not present

## 2020-08-29 DIAGNOSIS — R059 Cough, unspecified: Secondary | ICD-10-CM | POA: Diagnosis not present

## 2020-08-29 DIAGNOSIS — R0981 Nasal congestion: Secondary | ICD-10-CM | POA: Diagnosis not present

## 2020-09-01 ENCOUNTER — Ambulatory Visit: Payer: Medicare PPO

## 2020-09-02 ENCOUNTER — Ambulatory Visit (INDEPENDENT_AMBULATORY_CARE_PROVIDER_SITE_OTHER): Payer: Medicare PPO | Admitting: Family Medicine

## 2020-09-02 ENCOUNTER — Encounter: Payer: Self-pay | Admitting: Family Medicine

## 2020-09-02 VITALS — BP 92/68 | HR 101 | Temp 98.3°F | Ht 59.0 in | Wt 125.0 lb

## 2020-09-02 DIAGNOSIS — J209 Acute bronchitis, unspecified: Secondary | ICD-10-CM

## 2020-09-02 DIAGNOSIS — R059 Cough, unspecified: Secondary | ICD-10-CM

## 2020-09-02 LAB — POCT INFLUENZA A/B
Influenza A, POC: NEGATIVE
Influenza B, POC: NEGATIVE

## 2020-09-02 MED ORDER — DOXYCYCLINE HYCLATE 100 MG PO TABS: 100.0000 mg | ORAL_TABLET | Freq: Two times a day (BID) | ORAL | 0 refills | Status: DC

## 2020-09-02 MED ORDER — CETIRIZINE HCL 5 MG PO TABS: 5.0000 mg | ORAL_TABLET | Freq: Every day | ORAL | 1 refills | Status: DC

## 2020-09-02 NOTE — Patient Instructions (Signed)
Start cetrizine 1 tab before bed for 4 weeks to help with allergies.  For now, stop flonase since since you have bleeding in your nose. Use NASAL SALINE to flush nasal passages twice a day, this is over the counter- ask pharmacy to help. Start doxycyline every 12 hours with some food on your stomach. This is the antibiotic.   You have a sinus infection- which are usually started by allergies and a virus, with time a bacteria sets in and symptoms worsen. This is when the antibiotic is needed.   Your influenza test was negative.  We will call you with your covid test results and they will also go to your mychart.     Sinusitis, Adult Sinusitis is soreness and swelling (inflammation) of your sinuses. Sinuses are hollow spaces in the bones around your face. They are located:  Around your eyes.  In the middle of your forehead.  Behind your nose.  In your cheekbones. Your sinuses and nasal passages are lined with a fluid called mucus. Mucus drains out of your sinuses. Swelling can trap mucus in your sinuses. This lets germs (bacteria, virus, or fungus) grow, which leads to infection. Most of the time, this condition is caused by a virus. What are the causes? This condition is caused by:  Allergies.  Asthma.  Germs.  Things that block your nose or sinuses.  Growths in the nose (nasal polyps).  Chemicals or irritants in the air.  Fungus (rare). What increases the risk? You are more likely to develop this condition if:  You have a weak body defense system (immune system).  You do a lot of swimming or diving.  You use nasal sprays too much.  You smoke. What are the signs or symptoms? The main symptoms of this condition are pain and a feeling of pressure around the sinuses. Other symptoms include:  Stuffy nose (congestion).  Runny nose (drainage).  Swelling and warmth in the sinuses.  Headache.  Toothache.  A cough that may get worse at night.  Mucus that collects  in the throat or the back of the nose (postnasal drip).  Being unable to smell and taste.  Being very tired (fatigue).  A fever.  Sore throat.  Bad breath. How is this diagnosed? This condition is diagnosed based on:  Your symptoms.  Your medical history.  A physical exam.  Tests to find out if your condition is short-term (acute) or long-term (chronic). Your doctor may: ? Check your nose for growths (polyps). ? Check your sinuses using a tool that has a light (endoscope). ? Check for allergies or germs. ? Do imaging tests, such as an MRI or CT scan. How is this treated? Treatment for this condition depends on the cause and whether it is short-term or long-term.  If caused by a virus, your symptoms should go away on their own within 10 days. You may be given medicines to relieve symptoms. They include: ? Medicines that shrink swollen tissue in the nose. ? Medicines that treat allergies (antihistamines). ? A spray that treats swelling of the nostrils. ? Rinses that help get rid of thick mucus in your nose (nasal saline washes).  If caused by bacteria, your doctor may wait to see if you will get better without treatment. You may be given antibiotic medicine if you have: ? A very bad infection. ? A weak body defense system.  If caused by growths in the nose, you may need to have surgery. Follow these instructions at home:  Medicines  Take, use, or apply over-the-counter and prescription medicines only as told by your doctor. These may include nasal sprays.  If you were prescribed an antibiotic medicine, take it as told by your doctor. Do not stop taking the antibiotic even if you start to feel better. Hydrate and humidify  Drink enough water to keep your pee (urine) pale yellow.  Use a cool mist humidifier to keep the humidity level in your home above 50%.  Breathe in steam for 10-15 minutes, 3-4 times a day, or as told by your doctor. You can do this in the bathroom  while a hot shower is running.  Try not to spend time in cool or dry air.   Rest  Rest as much as you can.  Sleep with your head raised (elevated).  Make sure you get enough sleep each night. General instructions  Put a warm, moist washcloth on your face 3-4 times a day, or as often as told by your doctor. This will help with discomfort.  Wash your hands often with soap and water. If there is no soap and water, use hand sanitizer.  Do not smoke. Avoid being around people who are smoking (secondhand smoke).  Keep all follow-up visits as told by your doctor. This is important.   Contact a doctor if:  You have a fever.  Your symptoms get worse.  Your symptoms do not get better within 10 days. Get help right away if:  You have a very bad headache.  You cannot stop throwing up (vomiting).  You have very bad pain or swelling around your face or eyes.  You have trouble seeing.  You feel confused.  Your neck is stiff.  You have trouble breathing. Summary  Sinusitis is swelling of your sinuses. Sinuses are hollow spaces in the bones around your face.  This condition is caused by tissues in your nose that become inflamed or swollen. This traps germs. These can lead to infection.  If you were prescribed an antibiotic medicine, take it as told by your doctor. Do not stop taking it even if you start to feel better.  Keep all follow-up visits as told by your doctor. This is important. This information is not intended to replace advice given to you by your health care provider. Make sure you discuss any questions you have with your health care provider. Document Revised: 11/06/2017 Document Reviewed: 11/06/2017 Elsevier Patient Education  2021 [employer]. .

## 2020-09-02 NOTE — Progress Notes (Signed)
This visit occurred during the SARS-CoV-2 public health emergency.  Safety protocols were in place, including screening questions prior to the visit, additional usage of staff PPE, and extensive cleaning of exam room while observing appropriate contact time as indicated for disinfecting solutions.    Melissa [name] , 1934/07/27, 86 y.o., female MRN: [id number] Patient Care Team    Relationship Specialty Notifications Start End  Natalia Leatherwood, DO PCP - General Family Medicine  07/03/18   Luciana Axe Alford Highland, MD Consulting Physician Ophthalmology  07/03/18   [facility]  Ophthalmology  07/03/18   Allie Bossier, MD (Inactive) Consulting Physician Obstetrics and Gynecology  07/04/18   Lennette Bihari, MD Consulting Physician Cardiology  07/04/18   Patton Salles, MD Consulting Physician Obstetrics and Gynecology  02/18/20     Chief Complaint  Patient presents with  . Cough    Pt c/o cough with greenish tint, nasal congestion ; pt seen UC 08/29/20 sx started 3/10; neg covid test home Friday 08/28/20     Subjective: Pt presents for an OV with complaints of productive cough with nasal congestion.  Patient states symptoms started 08/27/2020.  She was seen in the urgent care 08/29/2020 and started on Flonase, Mucinex and 20 mg of prednisone for 5 days.  Patient reports her symptoms have continued to worsen over this time.  She is feeling fatigued and has a headache.  She performed a home COVID test on 08/28/2020 (day 1.5 of symptoms) and it was negative.  She is currently not taking an allergy medicine and she stopped the use of Flonase because she started having bloody noses.  Depression screen [facility] 2/9 08/26/2020 07/22/2020 02/11/2020 07/04/2018 01/03/2018  Decreased Interest 0 0 0 0 0  Down, Depressed, Hopeless 0 0 0 0 0  PHQ - 2 Score 0 0 0 0 0    No Known Allergies Social History   Social History Narrative   Marital status/children/pets: Widowed.   Education/employment: 12th  grade education.  Retired.   Safety:      -smoke alarm in the home:Yes     - wears seatbelt: Yes     - Feels safe in their relationships: Yes   Past Medical History:  Diagnosis Date  . Burning tongue syndrome 11/11/2015  . Colon polyps   . Cystocele with prolapse 2019   Dr. Marice Potter- Monitor for now.   . Detached retina, right   . Diabetes mellitus   . Diaphragmatic hernia   . GERD (gastroesophageal reflux disease)   . Hyperlipidemia   . Insomnia   . Macular degeneration    Past Surgical History:  Procedure Laterality Date  . ABDOMINAL HYSTERECTOMY    . FOOT SURGERY    . OOPHORECTOMY     Rt.ovary removed with Hyst  . RETINAL DETACHMENT SURGERY    . TONSILLECTOMY     Family History  Problem Relation Age of Onset  . Heart disease Other   . Breast cancer Other   . Breast cancer Mother        metastatic--to bone  . CAD Father   . Cancer Maternal Grandmother        unknown  . Cancer Paternal Grandmother        colon   Allergies as of 09/02/2020   No Known Allergies     Medication List       Accurate as of September 02, 2020  3:50 PM. If you have any questions,  ask your nurse or doctor.        STOP taking these medications   predniSONE 10 MG tablet Commonly known as: DELTASONE Stopped by: Melissa Pacini, DO     TAKE these medications   aspirin 81 MG tablet Take 81 mg by mouth daily.   atorvastatin 40 MG tablet Commonly known as: LIPITOR Take 1 tablet (40 mg total) by mouth daily.   cetirizine 5 MG tablet Commonly known as: ZYRTEC Take 1 tablet (5 mg total) by mouth at bedtime. Started by: Melissa Pacini, DO   doxycycline 100 MG tablet Commonly known as: VIBRA-TABS Take 1 tablet (100 mg total) by mouth 2 (two) times daily. Started by: Melissa Pacini, DO   escitalopram 5 MG tablet Commonly known as: Lexapro Take 1 tablet (5 mg total) by mouth daily.   fluticasone 50 MCG/ACT nasal spray Commonly known as: FLONASE 1 spray into each nostril daily.   gabapentin  300 MG capsule Commonly known as: NEURONTIN Take 1 capsule (300 mg total) by mouth at bedtime.   guaiFENesin 600 MG 12 hr tablet Commonly known as: MUCINEX Take by mouth.   metFORMIN 500 MG tablet Commonly known as: GLUCOPHAGE 500 mg (one tablet) in the morning and 1,000 mg (2 tablets) at night   metoprolol succinate 25 MG 24 hr tablet Commonly known as: Toprol XL Take 1 tablet (25 mg total) by mouth daily.   multivitamin tablet Take 1 tablet by mouth daily.   Oyster Shell Calcium/D 250-125 MG-UNIT Tabs Take by mouth.   pantoprazole 40 MG tablet Commonly known as: PROTONIX Take 1 tablet (40 mg total) by mouth daily.   tiZANidine 4 MG tablet Commonly known as: ZANAFLEX TAKE ONE TABLET BY MOUTH AT BEDTIME FOR MUSCLE SPASM PRN   traZODone 50 MG tablet Commonly known as: DESYREL Take 0.5-1 tablets (25-50 mg total) by mouth at bedtime as needed for sleep.       All past medical history, surgical history, allergies, family history, immunizations andmedications were updated in the EMR today and reviewed under the history and medication portions of their EMR.     ROS: Negative, with the exception of above mentioned in HPI   Objective:  BP 92/68   Pulse (!) 101   Temp 98.3 F (36.8 C) (Oral)   Ht 4\' 11"  (1.499 m)   Wt 125 lb (56.7 kg)   SpO2 97%   BMI 25.25 kg/m  Body mass index is 25.25 kg/m. Gen: Afebrile. No acute distress. Nontoxic in appearance, well developed, well nourished.  Very pleasant female. HENT: AT. [place]. Bilateral TM visualized without erythema or effusion. MMM, no oral lesions. Bilateral nares with erythema, drainage, swelling, blood. Throat without erythema or exudates.  Cough present.  No shortness of breath present. Eyes:Pupils Equal Round Reactive to light, Extraocular movements intact,  Conjunctiva without redness, discharge or icterus. Neck/lymp/endocrine: Supple, no lymphadenopathy CV: RRR no murmur, no edema Chest: CTAB, no wheeze or crackles.  Good air movement, normal resp effort.  Skin: No rashes, purpura or petechiae.  Neuro:  Normal gait. PERLA. EOMi. Alert. Oriented x3    No exam data present No results found. Results for orders placed or performed in visit on 09/02/20 (from the past 24 hour(s))  POCT Influenza A/B     Status: None   Collection Time: 09/02/20  1:58 PM  Result Value Ref Range   Influenza A, POC Negative Negative   Influenza B, POC Negative Negative    Assessment/Plan: KEYARI KLEEMAN is a  86 y.o. female present for OV for  Cough/bronchitis -Rest, hydrate. Failed conservative measures and prednisone burst provided by urgent care. Start doxycycline twice daily x10 days Start nasal saline flushes twice daily Start Zyrtec 5 mg nightly and continue through spring - POCT Influenza A/B> negative - Novel Coronavirus, NAA ([facility])> ending.  Repeated Covid testing since home testing was completed on day 1 of symptoms. -Patient aware to self isolate until results are received. If symptoms or not improving within 2 weeks or worsening follow-up.   Reviewed expectations re: course of current medical issues.  Discussed self-management of symptoms.  Outlined signs and symptoms indicating need for more acute intervention.  Patient verbalized understanding and all questions were answered.  Patient received an After-Visit Summary.    Orders Placed This Encounter  Procedures  . Novel Coronavirus, NAA ([facility])  . POCT Influenza A/B   Meds ordered this encounter  Medications  . cetirizine (ZYRTEC) 5 MG tablet    Sig: Take 1 tablet (5 mg total) by mouth at bedtime.    Dispense:  90 tablet    Refill:  1  . doxycycline (VIBRA-TABS) 100 MG tablet    Sig: Take 1 tablet (100 mg total) by mouth 2 (two) times daily.    Dispense:  20 tablet    Refill:  0   Referral Orders  No referral(s) requested today     Note is dictated utilizing voice recognition software. Although note has been proof read prior to  signing, occasional typographical errors still can be missed. If any questions arise, please do not hesitate to call for verification.   electronically signed by:  Melissa Pacini, DO  [facility] [place]

## 2020-09-03 LAB — NOVEL CORONAVIRUS, NAA: SARS-CoV-2, NAA: NOT DETECTED

## 2020-09-03 LAB — SARS-COV-2, NAA 2 DAY TAT

## 2020-09-25 ENCOUNTER — Ambulatory Visit
Admission: RE | Admit: 2020-09-25 | Discharge: 2020-09-25 | Disposition: A | Discharge status: Home / Self Care | Payer: Medicare PPO | Source: Ambulatory Visit | Attending: Family Medicine | Admitting: Family Medicine

## 2020-09-25 ENCOUNTER — Other Ambulatory Visit: Payer: Self-pay

## 2020-09-25 ENCOUNTER — Ambulatory Visit: Payer: Medicare PPO

## 2020-09-25 DIAGNOSIS — Z1231 Encounter for screening mammogram for malignant neoplasm of breast: Secondary | ICD-10-CM

## 2020-09-28 ENCOUNTER — Other Ambulatory Visit: Payer: Self-pay | Admitting: Family Medicine

## 2020-09-28 ENCOUNTER — Ambulatory Visit: Payer: Medicare PPO | Admitting: Family Medicine

## 2020-09-28 DIAGNOSIS — R928 Other abnormal and inconclusive findings on diagnostic imaging of breast: Secondary | ICD-10-CM

## 2020-10-21 ENCOUNTER — Ambulatory Visit (INDEPENDENT_AMBULATORY_CARE_PROVIDER_SITE_OTHER): Payer: Medicare PPO | Admitting: Family Medicine

## 2020-10-21 ENCOUNTER — Encounter: Payer: Self-pay | Admitting: Family Medicine

## 2020-10-21 ENCOUNTER — Other Ambulatory Visit: Payer: Self-pay

## 2020-10-21 VITALS — BP 87/55 | HR 92 | Temp 98.6°F | Ht 59.0 in | Wt 125.0 lb

## 2020-10-21 DIAGNOSIS — L84 Corns and callosities: Secondary | ICD-10-CM | POA: Insufficient documentation

## 2020-10-21 DIAGNOSIS — M79674 Pain in right toe(s): Secondary | ICD-10-CM

## 2020-10-21 NOTE — Progress Notes (Signed)
This visit occurred during the SARS-CoV-2 public health emergency.  Safety protocols were in place, including screening questions prior to the visit, additional usage of staff PPE, and extensive cleaning of exam room while observing appropriate contact time as indicated for disinfecting solutions.    Melissa [name] , [birth year], 86 y.o., female MRN: [id number] Patient Care Team    Relationship Specialty Notifications Start End  Natalia Leatherwood, DO PCP - General Family Medicine  07/03/18   Luciana Axe Alford Highland, MD Consulting Physician Ophthalmology  07/03/18   [facility]  Ophthalmology  07/03/18   Allie Bossier, MD (Inactive) Consulting Physician Obstetrics and Gynecology  07/04/18   Lennette Bihari, MD Consulting Physician Cardiology  07/04/18   Patton Salles, MD Consulting Physician Obstetrics and Gynecology  02/18/20     Chief Complaint  Patient presents with  . Toe Pain    Pt c/o R 2nd toe pain x 1 mo; pt reports pain worsen with pressure      Subjective: Pt presents for an OV with complaints of toe pain of her right second toe for about 1 month.  She reports she has a callus formation on the bottom of that toe that she used to be able to debride herself at home.  She stopped doing this but has continued to soak in Epson salt and warm water.  She states it has become significantly painful over the last month.  She denies any redness, fever or drainage from the area.  She is a diet-controlled diabetic.  Depression screen [facility] 2/9 08/26/2020 07/22/2020 02/11/2020 07/04/2018 01/03/2018  Decreased Interest 0 0 0 0 0  Down, Depressed, Hopeless 0 0 0 0 0  PHQ - 2 Score 0 0 0 0 0    No Known Allergies Social History   Social History Narrative   Marital status/children/pets: Widowed.   Education/employment: 12th grade education.  Retired.   Safety:      -smoke alarm in the home:Yes     - wears seatbelt: Yes     - Feels safe in their relationships: Yes   Past Medical  History:  Diagnosis Date  . Burning tongue syndrome 11/11/2015  . Colon polyps   . Cystocele with prolapse 2019   Dr. Marice Potter- Monitor for now.   . Detached retina, right   . Diabetes mellitus   . Diaphragmatic hernia   . GERD (gastroesophageal reflux disease)   . Hyperlipidemia   . Insomnia   . Macular degeneration    Past Surgical History:  Procedure Laterality Date  . ABDOMINAL HYSTERECTOMY    . FOOT SURGERY    . OOPHORECTOMY     Rt.ovary removed with Hyst  . RETINAL DETACHMENT SURGERY    . TONSILLECTOMY     Family History  Problem Relation Age of Onset  . Heart disease Other   . Breast cancer Other   . Breast cancer Mother        metastatic--to bone  . CAD Father   . Cancer Maternal Grandmother        unknown  . Cancer Paternal Grandmother        colon   Allergies as of 10/21/2020   No Known Allergies     Medication List       Accurate as of Oct 21, 2020 10:23 AM. If you have any questions, ask your nurse or doctor.        STOP taking these medications  doxycycline 100 MG tablet Commonly known as: VIBRA-TABS Stopped by: Felix Pacini, DO     TAKE these medications   aspirin 81 MG tablet Take 81 mg by mouth daily.   atorvastatin 40 MG tablet Commonly known as: LIPITOR Take 1 tablet (40 mg total) by mouth daily.   cetirizine 5 MG tablet Commonly known as: ZYRTEC Take 1 tablet (5 mg total) by mouth at bedtime.   escitalopram 5 MG tablet Commonly known as: Lexapro Take 1 tablet (5 mg total) by mouth daily.   fluticasone 50 MCG/ACT nasal spray Commonly known as: FLONASE   gabapentin 300 MG capsule Commonly known as: NEURONTIN Take 1 capsule (300 mg total) by mouth at bedtime.   guaiFENesin 600 MG 12 hr tablet Commonly known as: MUCINEX Take by mouth.   metFORMIN 500 MG tablet Commonly known as: GLUCOPHAGE 500 mg (one tablet) in the morning and 1,000 mg (2 tablets) at night   metoprolol succinate 25 MG 24 hr tablet Commonly known as: Toprol  XL Take 1 tablet (25 mg total) by mouth daily.   multivitamin tablet Take 1 tablet by mouth daily.   Oyster Shell Calcium/D 250-125 MG-UNIT Tabs Take by mouth.   pantoprazole 40 MG tablet Commonly known as: PROTONIX Take 1 tablet (40 mg total) by mouth daily.   tiZANidine 4 MG tablet Commonly known as: ZANAFLEX TAKE ONE TABLET BY MOUTH AT BEDTIME FOR MUSCLE SPASM PRN   traZODone 50 MG tablet Commonly known as: DESYREL Take 0.5-1 tablets (25-50 mg total) by mouth at bedtime as needed for sleep.       All past medical history, surgical history, allergies, family history, immunizations andmedications were updated in the EMR today and reviewed under the history and medication portions of their EMR.     ROS: Negative, with the exception of above mentioned in HPI   Objective:  BP (!) 87/55   Pulse 92   Temp 98.6 F (37 C) (Oral)   Ht 4\' 11"  (1.499 m)   Wt 125 lb (56.7 kg)   SpO2 98%   BMI 25.25 kg/m  Body mass index is 25.25 kg/m. Gen: Afebrile. No acute distress. Nontoxic in appearance, well developed, well nourished.  HENT: AT. [place].  Eyes:Pupils Equal Round Reactive to light, Extraocular movements intact,  Conjunctiva without redness, discharge or icterus. MSK/skin: Tender to palpation proximal plantar aspect of her second toe.  Significant callus formation present at this location.  No erythema.  No drainage from the area.  Callus formation also noted on first metatarsal head.  Neurovascular intact distally Neuro: Normal gait. PERLA. EOMi. Alert. Oriented x3   No exam data present No results found. No results found for this or any previous visit (from the past 24 hour(s)).  Assessment/Plan: Melissa [name] is a 86 y.o. female present for OV for  Callus/Toe pain, right Discussed with patient referral to podiatry for debridement of callus formations routinely.  She is agreeable to this plan.  Encouraged her to soak in warm Epson salt soaks daily.  Monitor her feet for  any signs of infection, erythema or drainage. Being that she is a diet-controlled diabetic she would benefit from at least every 6 months seeing the podiatrist to have this completed for her. She would also benefit from shoe padding. Directions were provided to patient to location of [facility] on [address]. - Ambulatory referral to Podiatry Follow-up.   Reviewed expectations re: course of current medical issues.  Discussed self-management of symptoms.  Outlined signs and symptoms indicating need for more acute intervention.  Patient verbalized understanding and all questions were answered.  Patient received an After-Visit Summary.    Orders Placed This Encounter  Procedures  . Ambulatory referral to Podiatry   No orders of the defined types were placed in this encounter.   Referral Orders     Ambulatory referral to Podiatry > 20 minutes dedicated to patients appt today, including home care, directions to podiatrist and referrals  Note is dictated utilizing voice recognition software. Although note has been proof read prior to signing, occasional typographical errors still can be missed. If any questions arise, please do not hesitate to call for verification.   electronically signed by:  Felix Pacini, DO  [facility] [place]

## 2020-10-21 NOTE — Patient Instructions (Signed)
I have referred you to podiatry at [facility] on [address]  They will call to get you scheduled.    Corns and Calluses Corns are small areas of thickened skin that form on the top, sides, or tip of a toe. Corns have a cone-shaped core with a point that can press on a nerve below. This causes pain. Calluses are areas of thickened skin that can form anywhere on the body, including the hands, fingers, palms, soles of the feet, and heels. Calluses are usually larger than corns. What are the causes? Corns and calluses are caused by rubbing (friction) or pressure, such as from shoes that are too tight or do not fit properly. What increases the risk? Corns are more likely to develop in people who have misshapen toes (toe deformities), such as hammer toes. Calluses can form with friction to any area of the skin. They are more likely to develop in people who:  Work with their hands.  Wear shoes that fit poorly, are too tight, or are high-heeled.  Have toe deformities. What are the signs or symptoms? Symptoms of a corn or callus include:  A hard growth on the skin.  Pain or tenderness under the skin.  Redness and swelling.  Increased discomfort while wearing tight-fitting shoes, if your feet are affected. If a corn or callus becomes infected, symptoms may include:  Redness and swelling that gets worse.  Pain.  Fluid, blood, or pus draining from the corn or callus.   How is this diagnosed? Corns and calluses may be diagnosed based on your symptoms, your medical history, and a physical exam. How is this treated? Treatment for corns and calluses may include:  Removing the cause of the friction or pressure. This may involve: ? Changing your shoes. ? Wearing shoe inserts (orthotics) or other protective layers in your shoes, such as a corn pad. ? Wearing gloves.  Applying medicine to the skin (topical medicine) to help soften skin in the hardened, thickened areas.  Removing  layers of dead skin with a file to reduce the size of the corn or callus.  Removing the corn or callus with a scalpel or laser.  Taking antibiotic medicines, if your corn or callus is infected.  Having surgery, if a toe deformity is the cause. Follow these instructions at home:  Take over-the-counter and prescription medicines only as told by your health care provider.  If you were prescribed an antibiotic medicine, take it as told by your health care provider. Do not stop taking it even if your condition improves.  Wear shoes that fit well. Avoid wearing high-heeled shoes and shoes that are too tight or too loose.  Wear any padding, protective layers, gloves, or orthotics as told by your health care provider.  Soak your hands or feet. Then use a file or pumice stone to soften your corn or callus. Do this as told by your health care provider.  Check your corn or callus every day for signs of infection.   Contact a health care provider if:  Your symptoms do not improve with treatment.  You have redness or swelling that gets worse.  Your corn or callus becomes painful.  You have fluid, blood, or pus coming from your corn or callus.  You have new symptoms. Get help right away if:  You develop severe pain with redness. Summary  Corns are small areas of thickened skin that form on the top, sides, or tip of a toe. These can be  painful.  Calluses are areas of thickened skin that can form anywhere on the body, including the hands, fingers, palms, and soles of the feet. Calluses are usually larger than corns.  Corns and calluses are caused by rubbing (friction) or pressure, such as from shoes that are too tight or do not fit properly.  Treatment may include wearing padding, protective layers, gloves, or orthotics as told by your health care provider. This information is not intended to replace advice given to you by your health care provider. Make sure you discuss any questions you  have with your health care provider. Document Revised: 10/03/2019 Document Reviewed: 10/03/2019 Elsevier Patient Education  2021 [employer].

## 2020-11-03 ENCOUNTER — Other Ambulatory Visit: Payer: Self-pay

## 2020-11-03 ENCOUNTER — Ambulatory Visit: Payer: Medicare PPO | Admitting: Podiatry

## 2020-11-03 ENCOUNTER — Ambulatory Visit
Admission: RE | Admit: 2020-11-03 | Discharge: 2020-11-03 | Disposition: A | Discharge status: Home / Self Care | Payer: Medicare PPO | Source: Ambulatory Visit | Attending: Family Medicine | Admitting: Family Medicine

## 2020-11-03 ENCOUNTER — Other Ambulatory Visit: Payer: Self-pay | Admitting: Family Medicine

## 2020-11-03 DIAGNOSIS — M2042 Other hammer toe(s) (acquired), left foot: Secondary | ICD-10-CM | POA: Diagnosis not present

## 2020-11-03 DIAGNOSIS — E119 Type 2 diabetes mellitus without complications: Secondary | ICD-10-CM | POA: Diagnosis not present

## 2020-11-03 DIAGNOSIS — M2041 Other hammer toe(s) (acquired), right foot: Secondary | ICD-10-CM | POA: Diagnosis not present

## 2020-11-03 DIAGNOSIS — E1151 Type 2 diabetes mellitus with diabetic peripheral angiopathy without gangrene: Secondary | ICD-10-CM

## 2020-11-03 DIAGNOSIS — R928 Other abnormal and inconclusive findings on diagnostic imaging of breast: Secondary | ICD-10-CM

## 2020-11-03 DIAGNOSIS — R922 Inconclusive mammogram: Secondary | ICD-10-CM | POA: Diagnosis not present

## 2020-11-03 DIAGNOSIS — N6002 Solitary cyst of left breast: Secondary | ICD-10-CM | POA: Diagnosis not present

## 2020-11-03 HISTORY — PX: BREAST CYST ASPIRATION: SHX578

## 2020-11-03 NOTE — Progress Notes (Signed)
  Subjective:  Patient ID: SOLINA HERON, female    DOB: 1934/11/28,  MRN: [id number]  Chief Complaint  Patient presents with  . Callouses    Right foot 1st and 2nd toe callus.     86 y.o. female presents with the above complaint. History confirmed with patient. DM but well controlled per patient.  Objective:  Physical Exam: warm, good capillary refill, no trophic changes or ulcerative lesions, normal sensory exam and normal DP; non-palpable PT pulses. Hammertoes bilat. HPK right hallux medial IPJ, 2nd plantar proximal phalanx.    Assessment:   1. Diabetes mellitus type 2 with peripheral artery disease (HCC)   2. Encounter for diabetic foot exam (HCC)   3. Hammer toes of both feet    Plan:  Patient was evaluated and treated and all questions answered.  Hammertoe and Diabetes -Patient is diabetic with a qualifying condition for at risk foot care. -Would benefit from DM shoes. Will make appt for fabrication  Procedure: Paring of Lesion Rationale: painful hyperkeratotic lesion Type of Debridement: manual, sharp debridement. Instrumentation: 312 blade Number of Lesions: 2  Return in about 3 months (around 02/03/2021) for Diabetic Foot Care.

## 2020-11-10 ENCOUNTER — Ambulatory Visit
Admission: RE | Admit: 2020-11-10 | Discharge: 2020-11-10 | Disposition: A | Discharge status: Home / Self Care | Payer: Medicare PPO | Source: Ambulatory Visit | Attending: Family Medicine | Admitting: Family Medicine

## 2020-11-10 ENCOUNTER — Other Ambulatory Visit: Payer: Medicare PPO

## 2020-11-10 ENCOUNTER — Other Ambulatory Visit: Payer: Self-pay

## 2020-11-10 DIAGNOSIS — R928 Other abnormal and inconclusive findings on diagnostic imaging of breast: Secondary | ICD-10-CM

## 2020-11-10 DIAGNOSIS — N6002 Solitary cyst of left breast: Secondary | ICD-10-CM | POA: Diagnosis not present

## 2020-12-30 ENCOUNTER — Ambulatory Visit: Payer: Medicare PPO | Admitting: Family Medicine

## 2020-12-30 ENCOUNTER — Other Ambulatory Visit: Payer: Self-pay

## 2020-12-30 ENCOUNTER — Encounter: Payer: Self-pay | Admitting: Family Medicine

## 2020-12-30 VITALS — BP 89/57 | HR 72 | Temp 98.7°F | Ht 59.0 in | Wt 126.0 lb

## 2020-12-30 DIAGNOSIS — E11638 Type 2 diabetes mellitus with other oral complications: Secondary | ICD-10-CM

## 2020-12-30 DIAGNOSIS — E785 Hyperlipidemia, unspecified: Secondary | ICD-10-CM

## 2020-12-30 DIAGNOSIS — E1165 Type 2 diabetes mellitus with hyperglycemia: Secondary | ICD-10-CM | POA: Diagnosis not present

## 2020-12-30 DIAGNOSIS — E1169 Type 2 diabetes mellitus with other specified complication: Secondary | ICD-10-CM | POA: Diagnosis not present

## 2020-12-30 DIAGNOSIS — K219 Gastro-esophageal reflux disease without esophagitis: Secondary | ICD-10-CM

## 2020-12-30 DIAGNOSIS — F419 Anxiety disorder, unspecified: Secondary | ICD-10-CM | POA: Diagnosis not present

## 2020-12-30 DIAGNOSIS — G8929 Other chronic pain: Secondary | ICD-10-CM

## 2020-12-30 DIAGNOSIS — Z23 Encounter for immunization: Secondary | ICD-10-CM

## 2020-12-30 DIAGNOSIS — IMO0002 Reserved for concepts with insufficient information to code with codable children: Secondary | ICD-10-CM

## 2020-12-30 DIAGNOSIS — E113219 Type 2 diabetes mellitus with mild nonproliferative diabetic retinopathy with macular edema, unspecified eye: Secondary | ICD-10-CM

## 2020-12-30 DIAGNOSIS — M545 Low back pain, unspecified: Secondary | ICD-10-CM

## 2020-12-30 LAB — COMPREHENSIVE METABOLIC PANEL
ALT: 8 U/L (ref 0–35)
AST: 14 U/L (ref 0–37)
Albumin: 4.3 g/dL (ref 3.5–5.2)
Alkaline Phosphatase: 64 U/L (ref 39–117)
BUN: 16 mg/dL (ref 6–23)
CO2: 29 mEq/L (ref 19–32)
Calcium: 9.6 mg/dL (ref 8.4–10.5)
Chloride: 102 mEq/L (ref 96–112)
Creatinine, Ser: 1 mg/dL (ref 0.40–1.20)
GFR: 51.85 mL/min — ABNORMAL LOW (ref >60.00)
Glucose, Bld: 99 mg/dL (ref 70–99)
Potassium: 5 mEq/L (ref 3.5–5.1)
Sodium: 139 mEq/L (ref 135–145)
Total Bilirubin: 0.5 mg/dL (ref 0.2–1.2)
Total Protein: 6.7 g/dL (ref 6.0–8.3)

## 2020-12-30 LAB — LIPID PANEL
Cholesterol: 193 mg/dL (ref 0–200)
HDL: 69.5 mg/dL (ref >39.00)
LDL Cholesterol: 94 mg/dL (ref 0–99)
NonHDL: 123.25
Total CHOL/HDL Ratio: 3
Triglycerides: 145 mg/dL (ref 0.0–149.0)
VLDL: 29 mg/dL (ref 0.0–40.0)

## 2020-12-30 LAB — TSH: TSH: 1.23 u[IU]/mL (ref 0.35–5.50)

## 2020-12-30 LAB — HEMOGLOBIN A1C: Hgb A1c MFr Bld: 6.7 % — ABNORMAL HIGH (ref 4.6–6.5)

## 2020-12-30 MED ORDER — TIZANIDINE HCL 4 MG PO TABS: ORAL_TABLET | ORAL | 1 refills | Status: DC

## 2020-12-30 MED ORDER — GABAPENTIN 300 MG PO CAPS: 300.0000 mg | ORAL_CAPSULE | Freq: Every day | ORAL | 1 refills | Status: DC

## 2020-12-30 MED ORDER — PANTOPRAZOLE SODIUM 40 MG PO TBEC: 40.0000 mg | DELAYED_RELEASE_TABLET | Freq: Every day | ORAL | 3 refills | Status: DC

## 2020-12-30 MED ORDER — TRAZODONE HCL 50 MG PO TABS: 25.0000 mg | ORAL_TABLET | Freq: Every evening | ORAL | 1 refills | Status: DC | PRN

## 2020-12-30 MED ORDER — ESCITALOPRAM OXALATE 5 MG PO TABS: 5.0000 mg | ORAL_TABLET | Freq: Every day | ORAL | 1 refills | Status: DC

## 2020-12-30 MED ORDER — TETANUS-DIPHTH-ACELL PERTUSSIS 5-2.5-18.5 LF-MCG/0.5 IM SUSP: 0.5000 mL | Freq: Once | INTRAMUSCULAR | 0 refills | Status: AC

## 2020-12-30 MED ORDER — CETIRIZINE HCL 5 MG PO TABS: 5.0000 mg | ORAL_TABLET | Freq: Every day | ORAL | 1 refills | Status: DC

## 2020-12-30 MED ORDER — METFORMIN HCL 500 MG PO TABS: ORAL_TABLET | ORAL | 1 refills | Status: DC

## 2020-12-30 NOTE — Addendum Note (Signed)
Addended by: Paschal Dopp on: 12/30/2020 10:55 AM   Modules accepted: Orders

## 2020-12-30 NOTE — Patient Instructions (Signed)
Great to see you today.  I have refilled the medication(s) we provide.   If labs were collected, we will inform you of lab results once received either by echart message or telephone call.   - echart message- for normal results that have been seen by the patient already.   - telephone call: abnormal results or if patient has not viewed results in their echart.  

## 2020-12-30 NOTE — Progress Notes (Signed)
Patient Care Team    Relationship Specialty Notifications Start End  Melissa Leatherwood, DO PCP - General Family Medicine  07/03/18   Rankin, Alford Highland, MD Consulting Physician Ophthalmology  07/03/18   [facility]  Ophthalmology  07/03/18   Allie Bossier, MD (Inactive) Consulting Physician Obstetrics and Gynecology  07/04/18   Lennette Bihari, MD Consulting Physician Cardiology  07/04/18   Patton Salles, MD Consulting Physician Obstetrics and Gynecology  02/18/20     SUBJECTIVE Chief Complaint  Patient presents with   Diabetes    CMC; Pt is fasting    HPI: Melissa [name] is a 86 y.o. present for f/u [facility] she is fasting today Diabetes/macular degeneration/nonproliferative retinopathy:  Pt reports compliance with metformin 500 mg in the morning and 1000 mg in the afternoon. Patient denies dizziness, hyperglycemic or hypoglycemic events. Patient denies numbness, tingling in the extremities or nonhealing wounds of feet.    She is prescribed gabapentin 300 mg nightly for Thoracic back pain.  PNA series: Completed 2019 Flu shot: Completed 2021 Urine microal: UTD 03/2020 Foot exam: completed today 03/05/2019 Eye exam: Dr. Dione Booze, routine eye exams with macular degeneration. UTD. 04/13/2020. A1c: 6.5>>7.3 >>6.3>>6.4> 6.8> 6.5 collected  Today  Palpitations/hyperlipidemia/peripheral artery disease/anxiety: She reports compliance with her metoprolol 25 mg daily, lexapro 5 mg qd. She is prescribed Lipitor 40 mg daily and takes a daily baby aspirin.  Echo Doppler study on October 03, 2017 showed an EF of 65 to 70%.  There was severe mitral annular calcification with very mild functional MS by valve area. Patient denies chest pain, shortness of breath, dizziness or lower extremity edema.    GERD: Patient reports Her GERD is well controlled. She is complaint with Protonix 40 mg daily.  She has been on this for quite some time and takes it daily. Without medication her symptoms  return.   Chronic thoracic back pain and new left jaw discomfort: Patient reports compliance with gabapentin has been extremely helpful. She only occasionally will use the Zanaflex and but make sure not to take at the same time as her gabapentin.  She denies any oversedation.   ROS: See pertinent positives and negatives per HPI.  Patient Active Problem List   Diagnosis Date Noted   Callus 10/21/2020   Early stage nonexudative age-related macular degeneration of left eye 04/13/2020   Advanced nonexudative age-related macular degeneration of right eye with subfoveal involvement 04/01/2020   Degenerative retinal drusen of left eye 04/01/2020   Pseudophakia, both eyes 04/01/2020   Posterior vitreous detachment of left eye 04/01/2020   Anxiety 03/05/2019   Chronic right-sided low back pain without sciatica 08/03/2018   Chronic bilateral thoracic back pain 08/03/2018   Iron deficiency anemia 07/04/2018   Palpitations 07/04/2018   Peripheral arterial disease (HCC) 07/11/2017   Varicose veins of both lower extremities 08/31/2016   Macular degeneration 05/05/2015   Hiatal hernia 01/28/2015   Type 2 DM mild nonproliferative retinopathy, macular edema, uncontrol (HCC) 10/02/2013   Hyperlipidemia associated with type 2 diabetes mellitus (HCC) 03/24/2009   GERD 03/24/2009    Social History   Tobacco Use   Smoking status: Never   Smokeless tobacco: Never  Substance Use Topics   Alcohol use: No    Current Outpatient Medications:    aspirin 81 MG tablet, Take 81 mg by mouth daily., Disp: , Rfl:    atorvastatin (LIPITOR) 40 MG tablet, Take 1 tablet (40 mg total) by mouth daily.,  Disp: 90 tablet, Rfl: 3   Calcium Carbonate-Vitamin D (OYSTER SHELL CALCIUM/D) 250-125 MG-UNIT TABS, Take by mouth., Disp: , Rfl:    metoprolol succinate (TOPROL XL) 25 MG 24 hr tablet, Take 1 tablet (25 mg total) by mouth daily., Disp: 90 tablet, Rfl: 3   Multiple Vitamin (MULTIVITAMIN) tablet, Take 1 tablet by  mouth daily., Disp: , Rfl:    Tdap (BOOSTRIX) 5-2.5-18.5 LF-MCG/0.5 injection, Inject 0.5 mLs into the muscle once for 1 dose., Disp: 0.5 mL, Rfl: 0   cetirizine (ZYRTEC) 5 MG tablet, Take 1 tablet (5 mg total) by mouth at bedtime., Disp: 90 tablet, Rfl: 1   escitalopram (LEXAPRO) 5 MG tablet, Take 1 tablet (5 mg total) by mouth daily., Disp: 90 tablet, Rfl: 1   gabapentin (NEURONTIN) 300 MG capsule, Take 1 capsule (300 mg total) by mouth at bedtime., Disp: 90 capsule, Rfl: 1   metFORMIN (GLUCOPHAGE) 500 MG tablet, 500 mg (one tablet) in the morning and 1,000 mg (2 tablets) at night, Disp: 270 tablet, Rfl: 1   pantoprazole (PROTONIX) 40 MG tablet, Take 1 tablet (40 mg total) by mouth daily., Disp: 90 tablet, Rfl: 3   tiZANidine (ZANAFLEX) 4 MG tablet, TAKE ONE TABLET BY MOUTH AT BEDTIME FOR MUSCLE SPASM PRN, Disp: 90 tablet, Rfl: 1   traZODone (DESYREL) 50 MG tablet, Take 0.5-1 tablets (25-50 mg total) by mouth at bedtime as needed for sleep., Disp: 90 tablet, Rfl: 1  No Known Allergies  OBJECTIVE: BP (!) 89/57   Pulse 72   Temp 98.7 F (37.1 C) (Oral)   Ht 4\' 11"  (1.499 m)   Wt 126 lb (57.2 kg)   SpO2 97%   BMI 25.45 kg/m  Gen: Afebrile. No acute distress. Very pleasant female.  HENT: AT. [place].  Neck/lymp/endocrine: Supple,no lymphadenopathy, no thyromegaly CV: RRR no murmur, no edema, +2/4 P posterior tibialis pulses Chest: CTAB, no wheeze or crackles Abd: Soft. NTND. BS present Skin: no rashes, purpura or petechiae.  Neuro: Normal gait. PERLA. EOMi. Alert. Orientedx3 Psych: Normal affect, dress and demeanor. Normal speech. Normal thought content and judgment.    No results found for this or any previous visit (from the past 24 hour(s)).  ASSESSMENT AND PLAN: [name] is a 86 y.o. female present for  Controlled type 2 diabetes mellitus (HCC)/Type 2 DM mild nonproliferative retinopathy, macular edema, uncontrol (HCC) Stable.  Continue metformin 1500 mg total a day- may  divide. PNA series: Completed 2019 Flu shot: Completed 2021 Urine microal: UTD 03/2020- collected today Foot exam: completed  Eye exam: Dr. 04/2020, routine eye exams with macular degeneration. UTD. 04/13/2020. A1c: 6.5>>7.3 >>6.3>>6.4> 6.8> 6.5 > lab collected  today   Hyperlipidemia associated with type 2 diabetes mellitus (HCC)/Peripheral arterial disease (HCC)/Palpitations/IDA/CKD 3 - Continue to  avoid caffeine, stimulants-her vice is excess chocolate. - continue metoprolol 25 mg QD - continue  atorvastatin 40 mg daily. - continue  baby aspirin daily. - avoid NSAIDS; renal dose.   Anxiety/insomnia Stable.  Continue lexapro 5 mg QD-tolerating Continue Trazodone 25-50 mg nightly  Gastroesophageal reflux disease without esophagitis Stable.  Continue  Protonix -Encouraged her to try Biotene 2 times a day.    Fecal smearing/hemorrhoids: Evaluated by Dr. 04/15/2020 Bladder prolapse: Referred to gyn last appt  Chronic thoracic back pain and new left jaw discomfort: Stable.  -continue gabapentin nightly  -continue  Zanaflex prn  Return in about 5-6 months      Orders Placed This Encounter  Procedures   CBC with  Differential/Platelet   Comprehensive metabolic panel   Lipid panel   TSH   Microalbumin / creatinine urine ratio   Hemoglobin A1c    Meds ordered this encounter  Medications   escitalopram (LEXAPRO) 5 MG tablet    Sig: Take 1 tablet (5 mg total) by mouth daily.    Dispense:  90 tablet    Refill:  1   gabapentin (NEURONTIN) 300 MG capsule    Sig: Take 1 capsule (300 mg total) by mouth at bedtime.    Dispense:  90 capsule    Refill:  1   metFORMIN (GLUCOPHAGE) 500 MG tablet    Sig: 500 mg (one tablet) in the morning and 1,000 mg (2 tablets) at night    Dispense:  270 tablet    Refill:  1   tiZANidine (ZANAFLEX) 4 MG tablet    Sig: TAKE ONE TABLET BY MOUTH AT BEDTIME FOR MUSCLE SPASM PRN    Dispense:  90 tablet    Refill:  1    Hold script until pt  request   traZODone (DESYREL) 50 MG tablet    Sig: Take 0.5-1 tablets (25-50 mg total) by mouth at bedtime as needed for sleep.    Dispense:  90 tablet    Refill:  1   Tdap (BOOSTRIX) 5-2.5-18.5 LF-MCG/0.5 injection    Sig: Inject 0.5 mLs into the muscle once for 1 dose.    Dispense:  0.5 mL    Refill:  0   cetirizine (ZYRTEC) 5 MG tablet    Sig: Take 1 tablet (5 mg total) by mouth at bedtime.    Dispense:  90 tablet    Refill:  1   pantoprazole (PROTONIX) 40 MG tablet    Sig: Take 1 tablet (40 mg total) by mouth daily.    Dispense:  90 tablet    Refill:  3      Tradarius Reinwald Claiborne Billings, DO 12/30/2020

## 2020-12-31 LAB — CBC WITH DIFFERENTIAL/PLATELET
Basophils Absolute: 0.1 10*3/uL (ref 0.0–0.1)
Basophils Relative: 0.9 % (ref 0.0–3.0)
Eosinophils Absolute: 0.2 10*3/uL (ref 0.0–0.7)
Eosinophils Relative: 3.2 % (ref 0.0–5.0)
HCT: 39.5 % (ref 36.0–46.0)
Hemoglobin: 13 g/dL (ref 12.0–15.0)
Lymphocytes Relative: 19.6 % (ref 12.0–46.0)
Lymphs Abs: 1.1 10*3/uL (ref 0.7–4.0)
MCHC: 33 g/dL (ref 30.0–36.0)
MCV: 93.4 fl (ref 78.0–100.0)
Monocytes Absolute: 0.5 10*3/uL (ref 0.1–1.0)
Monocytes Relative: 9 % (ref 3.0–12.0)
Neutro Abs: 3.7 10*3/uL (ref 1.4–7.7)
Neutrophils Relative %: 67.3 % (ref 43.0–77.0)
Platelets: 281 10*3/uL (ref 150.0–400.0)
RBC: 4.23 Mil/uL (ref 3.87–5.11)
RDW: 14.2 % (ref 11.5–15.5)
WBC: 5.5 10*3/uL (ref 4.0–10.5)

## 2021-01-07 DIAGNOSIS — D225 Melanocytic nevi of trunk: Secondary | ICD-10-CM | POA: Diagnosis not present

## 2021-01-07 DIAGNOSIS — Z1283 Encounter for screening for malignant neoplasm of skin: Secondary | ICD-10-CM | POA: Diagnosis not present

## 2021-01-07 DIAGNOSIS — L905 Scar conditions and fibrosis of skin: Secondary | ICD-10-CM | POA: Diagnosis not present

## 2021-01-11 ENCOUNTER — Ambulatory Visit (INDEPENDENT_AMBULATORY_CARE_PROVIDER_SITE_OTHER): Payer: Medicare PPO | Admitting: Family Medicine

## 2021-01-11 ENCOUNTER — Encounter: Payer: Self-pay | Admitting: Family Medicine

## 2021-01-11 ENCOUNTER — Ambulatory Visit: Payer: Medicare PPO

## 2021-01-11 ENCOUNTER — Other Ambulatory Visit: Payer: Self-pay

## 2021-01-11 VITALS — BP 93/58 | HR 88 | Temp 98.4°F | Wt 127.0 lb

## 2021-01-11 DIAGNOSIS — M278 Other specified diseases of jaws: Secondary | ICD-10-CM

## 2021-01-11 DIAGNOSIS — E11638 Type 2 diabetes mellitus with other oral complications: Secondary | ICD-10-CM | POA: Diagnosis not present

## 2021-01-11 DIAGNOSIS — K1379 Other lesions of oral mucosa: Secondary | ICD-10-CM

## 2021-01-11 LAB — MICROALBUMIN / CREATININE URINE RATIO
Creatinine,U: 128.8 mg/dL
Microalb Creat Ratio: 0.5 mg/g (ref 0.0–30.0)
Microalb, Ur: <0.7 mg/dL (ref 0.0–1.9)

## 2021-01-11 MED ORDER — CHLORHEXIDINE GLUCONATE 0.12 % MT SOLN: 15.0000 mL | Freq: Two times a day (BID) | OROMUCOSAL | 1 refills | Status: DC

## 2021-01-11 NOTE — Progress Notes (Signed)
This visit occurred during the SARS-CoV-2 public health emergency.  Safety protocols were in place, including screening questions prior to the visit, additional usage of staff PPE, and extensive cleaning of exam room while observing appropriate contact time as indicated for disinfecting solutions.    Melissa [name] , 12-05-1934, 86 y.o., female MRN: [id number] Patient Care Team    Relationship Specialty Notifications Start End  Natalia Leatherwood, DO PCP - General Family Medicine  07/03/18   Luciana Axe Alford Highland, MD Consulting Physician Ophthalmology  07/03/18   [facility]  Ophthalmology  07/03/18   Allie Bossier, MD (Inactive) Consulting Physician Obstetrics and Gynecology  07/04/18   Lennette Bihari, MD Consulting Physician Cardiology  07/04/18   Patton Salles, MD Consulting Physician Obstetrics and Gynecology  02/18/20     Chief Complaint  Patient presents with   Mouth Lesions    Pt c/o mouth lesion on gum x 4 days;      Subjective: Pt presents for an OV with complaints of tender area on her gumline.  She reports she noticed 4 days ago that the area was tender over a hard bony knot.  She states the bony knot has been there in the past but she thinks maybe she was aggressive with her dental pick and may have injured her gumline.  She denies any fever, chills, gumline bleeding or drainage from this area.  Depression screen [facility] 2/9 12/30/2020 08/26/2020 07/22/2020 02/11/2020 07/04/2018  Decreased Interest 0 0 0 0 0  Down, Depressed, Hopeless 0 0 0 0 0  PHQ - 2 Score 0 0 0 0 0    No Known Allergies Social History   Social History Narrative   Marital status/children/pets: Widowed.   Education/employment: 12th grade education.  Retired.   Safety:      -smoke alarm in the home:Yes     - wears seatbelt: Yes     - Feels safe in their relationships: Yes   Past Medical History:  Diagnosis Date   Burning tongue syndrome 11/11/2015   Colon polyps    Cystocele with  prolapse 2019   Dr. Marice Potter- Monitor for now.    Detached retina, right    Diabetes mellitus    Diaphragmatic hernia    GERD (gastroesophageal reflux disease)    Hyperlipidemia    Insomnia    Macular degeneration    Past Surgical History:  Procedure Laterality Date   ABDOMINAL HYSTERECTOMY     BREAST CYST ASPIRATION  11/03/2020   FOOT SURGERY     OOPHORECTOMY     Rt.ovary removed with Hyst   RETINAL DETACHMENT SURGERY     TONSILLECTOMY     Family History  Problem Relation Age of Onset   Heart disease Other    Breast cancer Other    Breast cancer Mother        metastatic--to bone   CAD Father    Cancer Maternal Grandmother        unknown   Cancer Paternal Grandmother        colon   Allergies as of 01/11/2021   No Known Allergies      Medication List        Accurate as of January 11, 2021 12:20 PM. If you have any questions, ask your nurse or doctor.          aspirin 81 MG tablet Take 81 mg by mouth daily.   atorvastatin 40 MG tablet Commonly  known as: LIPITOR Take 1 tablet (40 mg total) by mouth daily.   cetirizine 5 MG tablet Commonly known as: ZYRTEC Take 1 tablet (5 mg total) by mouth at bedtime.   chlorhexidine 0.12 % solution Commonly known as: PERIDEX Use as directed 15 mLs in the mouth or throat 2 (two) times daily. Started by: Felix Pacini, DO   escitalopram 5 MG tablet Commonly known as: Lexapro Take 1 tablet (5 mg total) by mouth daily.   gabapentin 300 MG capsule Commonly known as: NEURONTIN Take 1 capsule (300 mg total) by mouth at bedtime.   metFORMIN 500 MG tablet Commonly known as: GLUCOPHAGE 500 mg (one tablet) in the morning and 1,000 mg (2 tablets) at night   metoprolol succinate 25 MG 24 hr tablet Commonly known as: Toprol XL Take 1 tablet (25 mg total) by mouth daily.   multivitamin tablet Take 1 tablet by mouth daily.   Oyster Shell Calcium/D 250-125 MG-UNIT Tabs Take by mouth.   pantoprazole 40 MG tablet Commonly  known as: PROTONIX Take 1 tablet (40 mg total) by mouth daily.   tiZANidine 4 MG tablet Commonly known as: ZANAFLEX TAKE ONE TABLET BY MOUTH AT BEDTIME FOR MUSCLE SPASM PRN   traZODone 50 MG tablet Commonly known as: DESYREL Take 0.5-1 tablets (25-50 mg total) by mouth at bedtime as needed for sleep.        All past medical history, surgical history, allergies, family history, immunizations andmedications were updated in the EMR today and reviewed under the history and medication portions of their EMR.     ROS: Negative, with the exception of above mentioned in HPI   Objective:  BP (!) 93/58   Pulse 88   Temp 98.4 F (36.9 C) (Oral)   Wt 127 lb (57.6 kg)   SpO2 94%   BMI 25.65 kg/m  Body mass index is 25.65 kg/m. Gen: Afebrile. No acute distress. Nontoxic in appearance, well developed, well nourished.  HENT: AT. [place].  MMM, no gumline redness or drainage.  Mild tenderness to palpation over left lower gumline behind incisor area.  Mild bony growth at this location. Eyes:Pupils Equal Round Reactive to light, Extraocular movements intact,  Conjunctiva without redness, discharge or icterus. Neck/lymp/endocrine: Supple, no lymphadenopathy Neuro: Normal gait. PERLA. EOMi. Alert. Oriented x3 Psych: Normal affect, dress and demeanor. Normal speech. Normal thought content and judgment.  No results found. No results found. No results found for this or any previous visit (from the past 24 hour(s)).  Assessment/Plan: Melissa [name] is a 86 y.o. female present for OV for  Controlled type 2 diabetes mellitus with other oral complication, without long-term current use of insulin (HCC) - Microalbumin / creatinine urine ratio  Mouth pain/exostosis of jaw Patient has had 4 days of mouth pain over a bony growth in her mouth that has been present for a while.  Discussed exocstosis with her today.  This may be a small benign bony growth which is common.  However, she is using a metal dental  tool to scrape off tartar and states that she thinks she poked this area.  There is no obvious infection or redness today. Peridex solution twice daily for 7 days. Discussed dental tonic for routine use. Discussed safer options for dental cleansing. Follow-up as needed  Reviewed expectations re: course of current medical issues. Discussed self-management of symptoms. Outlined signs and symptoms indicating need for more acute intervention. Patient verbalized understanding and all questions were answered. Patient received an After-Visit Summary.  No orders of the defined types were placed in this encounter.  Meds ordered this encounter  Medications   chlorhexidine (PERIDEX) 0.12 % solution    Sig: Use as directed 15 mLs in the mouth or throat 2 (two) times daily.    Dispense:  120 mL    Refill:  1   Referral Orders  No referral(s) requested today     Note is dictated utilizing voice recognition software. Although note has been proof read prior to signing, occasional typographical errors still can be missed. If any questions arise, please do not hesitate to call for verification.   electronically signed by:  Felix Pacini, DO  [facility] [place]

## 2021-01-11 NOTE — Patient Instructions (Signed)
[employer] - Tooth & Gums Tonic (18 oz.) Mouthwash> buy off of amazon for best price. This is daily mouthwash and is great for gum health.    I have also called in a prescribed mouthwash for use twice a day while you are having discomfort next 7 days. Then keep in case happens again.

## 2021-02-05 ENCOUNTER — Other Ambulatory Visit: Payer: Self-pay

## 2021-02-05 ENCOUNTER — Ambulatory Visit (INDEPENDENT_AMBULATORY_CARE_PROVIDER_SITE_OTHER): Payer: Medicare PPO | Admitting: Podiatry

## 2021-02-05 DIAGNOSIS — B351 Tinea unguium: Secondary | ICD-10-CM | POA: Diagnosis not present

## 2021-02-05 DIAGNOSIS — L84 Corns and callosities: Secondary | ICD-10-CM

## 2021-02-05 DIAGNOSIS — E1151 Type 2 diabetes mellitus with diabetic peripheral angiopathy without gangrene: Secondary | ICD-10-CM | POA: Diagnosis not present

## 2021-02-05 LAB — HM DIABETES FOOT EXAM

## 2021-02-05 NOTE — Progress Notes (Signed)
  Subjective:  Patient ID: Melissa [name], female    DOB: [birth year],  MRN: [id number]  Chief Complaint  Patient presents with   Foot Problem    Callous check, pt states it is improving.    Leg Problem    Tick bite, encouraged pt to speak with PCP.   86 y.o. female presents with the above complaint. History confirmed with patient.    Objective:  Physical Exam: warm, good capillary refill, no trophic changes or ulcerative lesions, normal sensory exam and normal DP; non-palpable PT pulses. Hammertoes bilat. HPK right hallux medial IPJ, 2nd plantar proximal phalanx.    Assessment:   1. Diabetes mellitus type 2 with peripheral artery disease (HCC)   2. Onychomycosis   3. Callus    Plan:  Patient was evaluated and treated and all questions answered.  Diabetes and PAD -Patient is diabetic with a qualifying condition for at risk foot care.  Procedure: Nail Debridement Type of Debridement: manual, sharp debridement. Instrumentation: Nail nipper, rotary burr. Number of Nails: 10  Procedure: Paring of Lesion Rationale: painful hyperkeratotic lesion Type of Debridement: manual, sharp debridement. Instrumentation: 32 blade Number of Lesions: 2   Return in about 3 months (around 05/08/2021) for Diabetic Foot Care.

## 2021-05-04 DIAGNOSIS — E119 Type 2 diabetes mellitus without complications: Secondary | ICD-10-CM | POA: Diagnosis not present

## 2021-05-04 DIAGNOSIS — Z8669 Personal history of other diseases of the nervous system and sense organs: Secondary | ICD-10-CM | POA: Diagnosis not present

## 2021-05-04 DIAGNOSIS — H5203 Hypermetropia, bilateral: Secondary | ICD-10-CM | POA: Diagnosis not present

## 2021-05-04 DIAGNOSIS — H524 Presbyopia: Secondary | ICD-10-CM | POA: Diagnosis not present

## 2021-05-04 DIAGNOSIS — Z961 Presence of intraocular lens: Secondary | ICD-10-CM | POA: Diagnosis not present

## 2021-05-18 ENCOUNTER — Ambulatory Visit: Payer: Medicare PPO | Admitting: Podiatry

## 2021-06-16 ENCOUNTER — Ambulatory Visit: Payer: Medicare PPO | Admitting: Family Medicine

## 2021-06-16 ENCOUNTER — Other Ambulatory Visit: Payer: Self-pay

## 2021-06-16 ENCOUNTER — Encounter: Payer: Self-pay | Admitting: Family Medicine

## 2021-06-16 VITALS — BP 95/55 | HR 88 | Temp 98.3°F | Ht 59.0 in | Wt 126.0 lb

## 2021-06-16 DIAGNOSIS — G8929 Other chronic pain: Secondary | ICD-10-CM | POA: Diagnosis not present

## 2021-06-16 DIAGNOSIS — K219 Gastro-esophageal reflux disease without esophagitis: Secondary | ICD-10-CM

## 2021-06-16 DIAGNOSIS — F419 Anxiety disorder, unspecified: Secondary | ICD-10-CM

## 2021-06-16 DIAGNOSIS — R002 Palpitations: Secondary | ICD-10-CM | POA: Diagnosis not present

## 2021-06-16 DIAGNOSIS — E1169 Type 2 diabetes mellitus with other specified complication: Secondary | ICD-10-CM | POA: Diagnosis not present

## 2021-06-16 DIAGNOSIS — E785 Hyperlipidemia, unspecified: Secondary | ICD-10-CM | POA: Diagnosis not present

## 2021-06-16 DIAGNOSIS — M545 Low back pain, unspecified: Secondary | ICD-10-CM

## 2021-06-16 LAB — POCT GLYCOSYLATED HEMOGLOBIN (HGB A1C)
HbA1c POC (<> result, manual entry): 6.4 % (ref 4.0–5.6)
HbA1c, POC (controlled diabetic range): 6.4 % (ref 0.0–7.0)
HbA1c, POC (prediabetic range): 6.4 % (ref 5.7–6.4)
Hemoglobin A1C: 6.4 % — AB (ref 4.0–5.6)

## 2021-06-16 MED ORDER — PANTOPRAZOLE SODIUM 40 MG PO TBEC: 40.0000 mg | DELAYED_RELEASE_TABLET | Freq: Every day | ORAL | 3 refills | Status: DC

## 2021-06-16 MED ORDER — ESCITALOPRAM OXALATE 5 MG PO TABS: 5.0000 mg | ORAL_TABLET | Freq: Every day | ORAL | 1 refills | Status: DC

## 2021-06-16 MED ORDER — CETIRIZINE HCL 5 MG PO TABS: 5.0000 mg | ORAL_TABLET | Freq: Every day | ORAL | 1 refills | Status: DC

## 2021-06-16 MED ORDER — GABAPENTIN 300 MG PO CAPS: 300.0000 mg | ORAL_CAPSULE | Freq: Every day | ORAL | 1 refills | Status: DC

## 2021-06-16 MED ORDER — TIZANIDINE HCL 4 MG PO TABS: ORAL_TABLET | ORAL | 1 refills | Status: DC

## 2021-06-16 MED ORDER — METFORMIN HCL 500 MG PO TABS: ORAL_TABLET | ORAL | 1 refills | Status: DC

## 2021-06-16 NOTE — Progress Notes (Signed)
Patient Care Team    Relationship Specialty Notifications Start End  [name], DO PCP - General Family Medicine  07/03/18   Rankin, Clent Demark, MD Consulting Physician Ophthalmology  07/03/18   [facility]  Ophthalmology  07/03/18   Emily Filbert, MD (Inactive) Consulting Physician Obstetrics and Gynecology  07/04/18   Troy Sine, MD Consulting Physician Cardiology  07/04/18   Nunzio Cobbs, MD Consulting Physician Obstetrics and Gynecology  02/18/20     SUBJECTIVE Chief Complaint  Patient presents with   Diabetes    [place]; pt is fasting    HPI: Melissa [name] is a 86 y.o. present for f/u [facility] she is fasting today Diabetes/macular degeneration/nonproliferative retinopathy:  Pt reports  compliance with metformin 500 mg in the morning and 1000 mg in the afternoon. Patient denies chest pain, shortness of breath, dizziness or lower extremity edema.  She is prescribed gabapentin 300 mg nightly for Thoracic back pain.    Palpitations/hyperlipidemia/peripheral artery disease/anxiety: She reports compliance with her metoprolol 25 mg daily, lexapro 5 mg qd. She is prescribed Lipitor 40 mg daily and takes a daily baby aspirin.  Echo Doppler study on October 03, 2017 showed an EF of 65 to 70%.  There was severe mitral annular calcification with very mild functional MS by valve area. Patient denies chest pain, shortness of breath, dizziness or lower extremity edema.    GERD: Patient reports Her GERD is well controlled. She is compliant with Protonix 40 mg daily.  She has been on this for quite some time and takes it daily. Without medication her symptoms return.   Chronic thoracic back pain and new left jaw discomfort: Patient reports compliance with gabapentin has been extremely helpful. She only occasionally will use the Zanaflex and but make sure not to take at the same time as her gabapentin.  She denies any oversedation.   ROS: See pertinent positives and  negatives per HPI.  Patient Active Problem List   Diagnosis Date Noted   Callus 10/21/2020   Early stage nonexudative age-related macular degeneration of left eye 04/13/2020   Advanced nonexudative age-related macular degeneration of right eye with subfoveal involvement 04/01/2020   Degenerative retinal drusen of left eye 04/01/2020   Pseudophakia, both eyes 04/01/2020   Posterior vitreous detachment of left eye 04/01/2020   Anxiety 03/05/2019   Chronic right-sided low back pain without sciatica 08/03/2018   Chronic bilateral thoracic back pain 08/03/2018   Iron deficiency anemia 07/04/2018   Palpitations 07/04/2018   Peripheral arterial disease ([place]) 07/11/2017   Varicose veins of both lower extremities 08/31/2016   Macular degeneration 05/05/2015   Hiatal hernia 01/28/2015   Type 2 DM mild nonproliferative retinopathy, macular edema, uncontrol 10/02/2013   Hyperlipidemia associated with type 2 diabetes mellitus ([place]) 03/24/2009   GERD 03/24/2009    Social History   Tobacco Use   Smoking status: Never   Smokeless tobacco: Never  Substance Use Topics   Alcohol use: No    Current Outpatient Medications:    aspirin 81 MG tablet, Take 81 mg by mouth daily., Disp: , Rfl:    atorvastatin (LIPITOR) 40 MG tablet, Take 1 tablet (40 mg total) by mouth daily., Disp: 90 tablet, Rfl: 3   Calcium Carbonate-Vitamin D (OYSTER SHELL CALCIUM/D) 250-125 MG-UNIT TABS, Take by mouth., Disp: , Rfl:    metoprolol succinate (TOPROL XL) 25 MG 24 hr tablet, Take 1 tablet (25 mg total) by mouth daily., Disp: 90  tablet, Rfl: 3   Multiple Vitamin (MULTIVITAMIN) tablet, Take 1 tablet by mouth daily., Disp: , Rfl:    cetirizine (ZYRTEC) 5 MG tablet, Take 1 tablet (5 mg total) by mouth at bedtime., Disp: 90 tablet, Rfl: 1   escitalopram (LEXAPRO) 5 MG tablet, Take 1 tablet (5 mg total) by mouth daily., Disp: 90 tablet, Rfl: 1   gabapentin (NEURONTIN) 300 MG capsule, Take 1 capsule (300 mg total) by mouth at  bedtime., Disp: 90 capsule, Rfl: 1   metFORMIN (GLUCOPHAGE) 500 MG tablet, 500 mg (one tablet) in the morning and 1,000 mg (2 tablets) at night, Disp: 270 tablet, Rfl: 1   pantoprazole (PROTONIX) 40 MG tablet, Take 1 tablet (40 mg total) by mouth daily., Disp: 90 tablet, Rfl: 3   tiZANidine (ZANAFLEX) 4 MG tablet, TAKE ONE TABLET BY MOUTH AT BEDTIME FOR MUSCLE SPASM PRN, Disp: 90 tablet, Rfl: 1  No Known Allergies  OBJECTIVE: BP (!) 95/55    Pulse 88    Temp 98.3 F (36.8 C) (Oral)    Ht 4\' 11"  (1.499 m)    Wt 126 lb (57.2 kg)    SpO2 97%    BMI 25.45 kg/m  Physical Exam Vitals and nursing note reviewed.  Constitutional:      General: She is not in acute distress.    Appearance: Normal appearance. She is not ill-appearing, toxic-appearing or diaphoretic.  HENT:     Head: Normocephalic and atraumatic.     Mouth/Throat:     Mouth: Mucous membranes are moist.  Eyes:     General: No scleral icterus.       Right eye: No discharge.        Left eye: No discharge.     Extraocular Movements: Extraocular movements intact.     Conjunctiva/sclera: Conjunctivae normal.     Pupils: Pupils are equal, round, and reactive to light.  Cardiovascular:     Rate and Rhythm: Normal rate and regular rhythm.  Pulmonary:     Effort: Pulmonary effort is normal. No respiratory distress.     Breath sounds: Normal breath sounds. No wheezing, rhonchi or rales.  Musculoskeletal:     Cervical back: Neck supple. No tenderness.  Lymphadenopathy:     Cervical: No cervical adenopathy.  Skin:    General: Skin is warm and dry.     Coloration: Skin is not jaundiced or pale.     Findings: No erythema or rash.  Neurological:     Mental Status: She is alert and oriented to person, place, and time. Mental status is at baseline.     Motor: No weakness.     Gait: Gait normal.  Psychiatric:        Mood and Affect: Mood normal.        Behavior: Behavior normal.        Thought Content: Thought content normal.         Judgment: Judgment normal.    Results for orders placed or performed in visit on 06/16/21 (from the past 24 hour(s))  POCT HgB A1C     Status: Abnormal   Collection Time: 06/16/21 11:18 AM  Result Value Ref Range   Hemoglobin A1C 6.4 (A) 4.0 - 5.6 %   HbA1c POC (<> result, manual entry) 6.4 4.0 - 5.6 %   HbA1c, POC (prediabetic range) 6.4 5.7 - 6.4 %   HbA1c, POC (controlled diabetic range) 6.4 0.0 - 7.0 %    ASSESSMENT AND PLAN: Melissa [name] is a  86 y.o. female present for  Controlled type 2 diabetes mellitus (HCC)/Type 2 DM mild nonproliferative retinopathy, macular edema, uncontrol (HCC) stable Continue metformin 1500 mg total a day- may divide. PNA series: Completed 2019 Flu shot: Completed 2022 Urine microal: UTD 03/2020- collected today Foot exam: completed  Eye exam: Dr. Katy Fitch, routine eye exams with macular degeneration.04/13/2020> encouraged her  A1c: 6.5>>7.3 >>6.3>>6.4> 6.8> 6.5 > 6.4 today   Hyperlipidemia associated with type 2 diabetes mellitus (HCC)/Peripheral arterial disease (HCC)/Palpitations/IDA/CKD 3 - Continue to  avoid caffeine, stimulants-her vice is excess chocolate. - continue metoprolol 25 mg QD> prescribed by cardio - continue  atorvastatin 40 mg daily> prescribed by cardio - continue  baby aspirin daily. - avoid NSAIDS; renal dose.   Anxiety/insomnia stable Continue  lexapro 5 mg QD-tolerating She discontinued  Trazodone 25-50 mg nightly (felt made her shaky)  Gastroesophageal reflux disease without esophagitis stable continue Protonix -Encouraged her to try Biotene 2 times a day.    Fecal smearing/hemorrhoids: Evaluated by Dr. Earlean Shawl  Bladder prolapse: Referred to gyn last appt  Chronic thoracic back pain and new left jaw discomfort: stable -continue gabapentin nightly  -continue  Zanaflex prn  Return in about 5-6 months      Orders Placed This Encounter  Procedures   POCT HgB A1C    Meds ordered this encounter  Medications    cetirizine (ZYRTEC) 5 MG tablet    Sig: Take 1 tablet (5 mg total) by mouth at bedtime.    Dispense:  90 tablet    Refill:  1   escitalopram (LEXAPRO) 5 MG tablet    Sig: Take 1 tablet (5 mg total) by mouth daily.    Dispense:  90 tablet    Refill:  1   gabapentin (NEURONTIN) 300 MG capsule    Sig: Take 1 capsule (300 mg total) by mouth at bedtime.    Dispense:  90 capsule    Refill:  1   metFORMIN (GLUCOPHAGE) 500 MG tablet    Sig: 500 mg (one tablet) in the morning and 1,000 mg (2 tablets) at night    Dispense:  270 tablet    Refill:  1   tiZANidine (ZANAFLEX) 4 MG tablet    Sig: TAKE ONE TABLET BY MOUTH AT BEDTIME FOR MUSCLE SPASM PRN    Dispense:  90 tablet    Refill:  1    Hold script until pt request   pantoprazole (PROTONIX) 40 MG tablet    Sig: Take 1 tablet (40 mg total) by mouth daily.    Dispense:  90 tablet    Refill:  [place], DO 06/16/2021

## 2021-06-16 NOTE — Patient Instructions (Addendum)
° °  Great to see you today.  I have refilled the medication(s) we provide.   If labs were collected, we will inform you of lab results once received either by echart message or telephone call.   - echart message- for normal results that have been seen by the patient already.   - telephone call: abnormal results or if patient has not viewed results in their echart.   Next appt in 5.5 mos. We will do fasting labs that appt.

## 2021-07-12 ENCOUNTER — Other Ambulatory Visit: Payer: Self-pay

## 2021-07-12 ENCOUNTER — Encounter: Payer: Self-pay | Admitting: Family Medicine

## 2021-07-12 ENCOUNTER — Ambulatory Visit: Payer: Medicare PPO | Admitting: Family Medicine

## 2021-07-12 VITALS — BP 97/63 | HR 107 | Temp 98.8°F | Wt 124.0 lb

## 2021-07-12 DIAGNOSIS — J208 Acute bronchitis due to other specified organisms: Secondary | ICD-10-CM | POA: Diagnosis not present

## 2021-07-12 DIAGNOSIS — R6889 Other general symptoms and signs: Secondary | ICD-10-CM | POA: Diagnosis not present

## 2021-07-12 DIAGNOSIS — B9689 Other specified bacterial agents as the cause of diseases classified elsewhere: Secondary | ICD-10-CM | POA: Diagnosis not present

## 2021-07-12 LAB — POCT RAPID STREP A (OFFICE): Rapid Strep A Screen: NEGATIVE

## 2021-07-12 LAB — POCT INFLUENZA A/B
Influenza A, POC: NEGATIVE
Influenza B, POC: NEGATIVE

## 2021-07-12 LAB — POC COVID19 BINAXNOW: SARS Coronavirus 2 Ag: NEGATIVE

## 2021-07-12 MED ORDER — BENZONATATE 200 MG PO CAPS: 200.0000 mg | ORAL_CAPSULE | Freq: Two times a day (BID) | ORAL | 0 refills | Status: DC | PRN

## 2021-07-12 MED ORDER — DOXYCYCLINE HYCLATE 100 MG PO TABS: 100.0000 mg | ORAL_TABLET | Freq: Two times a day (BID) | ORAL | 0 refills | Status: DC

## 2021-07-12 NOTE — Progress Notes (Signed)
This visit occurred during the SARS-CoV-2 public health emergency.  Safety protocols were in place, including screening questions prior to the visit, additional usage of staff PPE, and extensive cleaning of exam room while observing appropriate contact time as indicated for disinfecting solutions.    Melissa [name] , [birth year], 87 y.o., female MRN: [id number] Patient Care Team    Relationship Specialty Notifications Start End  [name], DO PCP - General Family Medicine  07/03/18   Zadie Rhine Clent Demark, MD Consulting Physician Ophthalmology  07/03/18   [facility]  Ophthalmology  07/03/18   Emily Filbert, MD (Inactive) Consulting Physician Obstetrics and Gynecology  07/04/18   Troy Sine, MD Consulting Physician Cardiology  07/04/18   Nunzio Cobbs, MD Consulting Physician Obstetrics and Gynecology  02/18/20     Chief Complaint  Patient presents with   Cough    Pt c/o cough, congestion and nasal drainage x 5 days;      Subjective: Pt presents for an OV with complaints of runny nose, cough and congestion  of 5 days duration.  Associated symptoms include decreased appetite, hoarseness. She states she tolerating PO. She has not been exposed to sick contacts that she is aware of.  Influenza and  covid vaccines UTD.  Pt has tried OTC cough supressant to ease their symptoms.   Depression screen [facility] 2/9 06/16/2021 12/30/2020 08/26/2020 07/22/2020 02/11/2020  Decreased Interest 0 0 0 0 0  Down, Depressed, Hopeless 0 0 0 0 0  PHQ - 2 Score 0 0 0 0 0    No Known Allergies Social History   Social History Narrative   Marital status/children/pets: Widowed.   Education/employment: 12th grade education.  Retired.   Safety:      -smoke alarm in the home:Yes     - wears seatbelt: Yes     - Feels safe in their relationships: Yes   Past Medical History:  Diagnosis Date   Burning tongue syndrome 11/11/2015   Colon polyps    Cystocele with prolapse 2019   Dr.  Hulan Fray- Monitor for now.    Detached retina, right    Diabetes mellitus    Diaphragmatic hernia    GERD (gastroesophageal reflux disease)    Hyperlipidemia    Insomnia    Macular degeneration    Past Surgical History:  Procedure Laterality Date   ABDOMINAL HYSTERECTOMY     BREAST CYST ASPIRATION  11/03/2020   FOOT SURGERY     OOPHORECTOMY     Rt.ovary removed with Hyst   RETINAL DETACHMENT SURGERY     TONSILLECTOMY     Family History  Problem Relation Age of Onset   Heart disease Other    Breast cancer Other    Breast cancer Mother        metastatic--to bone   CAD Father    Cancer Maternal Grandmother        unknown   Cancer Paternal Grandmother        colon   Allergies as of 07/12/2021   No Known Allergies      Medication List        Accurate as of July 12, 2021 10:33 AM. If you have any questions, ask your nurse or doctor.          aspirin 81 MG tablet Take 81 mg by mouth daily.   atorvastatin 40 MG tablet Commonly known as: LIPITOR Take 1 tablet (40 mg total) by mouth  daily.   cetirizine 5 MG tablet Commonly known as: ZYRTEC Take 1 tablet (5 mg total) by mouth at bedtime.   escitalopram 5 MG tablet Commonly known as: Lexapro Take 1 tablet (5 mg total) by mouth daily.   gabapentin 300 MG capsule Commonly known as: NEURONTIN Take 1 capsule (300 mg total) by mouth at bedtime.   metFORMIN 500 MG tablet Commonly known as: GLUCOPHAGE 500 mg (one tablet) in the morning and 1,000 mg (2 tablets) at night   metoprolol succinate 25 MG 24 hr tablet Commonly known as: Toprol XL Take 1 tablet (25 mg total) by mouth daily.   multivitamin tablet Take 1 tablet by mouth daily.   Oyster Shell Calcium/D 250-125 MG-UNIT Tabs Take by mouth.   pantoprazole 40 MG tablet Commonly known as: PROTONIX Take 1 tablet (40 mg total) by mouth daily.   tiZANidine 4 MG tablet Commonly known as: ZANAFLEX TAKE ONE TABLET BY MOUTH AT BEDTIME FOR MUSCLE SPASM PRN         All past medical history, surgical history, allergies, family history, immunizations andmedications were updated in the EMR today and reviewed under the history and medication portions of their EMR.     ROS Negative, with the exception of above mentioned in HPI   Objective:  BP 97/63    Pulse (!) 107    Temp 98.8 F (37.1 C) (Oral)    Wt 124 lb (56.2 kg)    SpO2 95%    BMI 25.04 kg/m  Body mass index is 25.04 kg/m. Physical Exam Vitals and nursing note reviewed.  Constitutional:      General: She is not in acute distress.    Appearance: Normal appearance. She is not ill-appearing, toxic-appearing or diaphoretic.  HENT:     Head: Normocephalic and atraumatic.     Right Ear: Tympanic membrane, ear canal and external ear normal.     Left Ear: Tympanic membrane, ear canal and external ear normal.     Nose: Congestion and rhinorrhea present.     Mouth/Throat:     Mouth: Mucous membranes are moist.  Eyes:     General: No scleral icterus.       Right eye: No discharge.        Left eye: No discharge.     Extraocular Movements: Extraocular movements intact.     Conjunctiva/sclera: Conjunctivae normal.     Pupils: Pupils are equal, round, and reactive to light.  Cardiovascular:     Rate and Rhythm: Normal rate and regular rhythm.  Pulmonary:     Effort: Pulmonary effort is normal. No respiratory distress.     Breath sounds: Rhonchi present. No wheezing or rales.  Musculoskeletal:     Cervical back: Neck supple. No tenderness.  Lymphadenopathy:     Cervical: No cervical adenopathy.  Skin:    General: Skin is warm and dry.     Coloration: Skin is not jaundiced or pale.     Findings: No erythema or rash.  Neurological:     Mental Status: She is alert and oriented to person, place, and time. Mental status is at baseline.     Motor: No weakness.     Gait: Gait normal.  Psychiatric:        Mood and Affect: Mood normal.        Behavior: Behavior normal.        Thought  Content: Thought content normal.        Judgment: Judgment normal.  No results found. No results found. Results for orders placed or performed in visit on 07/12/21 (from the past 24 hour(s))  POC COVID-19 BinaxNow     Status: None   Collection Time: 07/12/21 10:24 AM  Result Value Ref Range   SARS Coronavirus 2 Ag Negative Negative  POCT rapid strep A     Status: None   Collection Time: 07/12/21 10:25 AM  Result Value Ref Range   Rapid Strep A Screen Negative Negative  POCT Influenza A/B     Status: None   Collection Time: 07/12/21 10:25 AM  Result Value Ref Range   Influenza A, POC Negative Negative   Influenza B, POC Negative Negative    Assessment/Plan: Melissa [name] is a 87 y.o. female present for OV for  Flu-like symptoms/Acute bronchitis, bacterial - POC COVID-19 BinaxNow - POCT rapid strep A - POCT Influenza A/B Rest, hydrate.  mucinex (DM if cough), nettie pot or nasal saline.  Doxy BID prescribed, take until completed.  If cough present it can last up to 6-8 weeks.  F/U 2 weeks if not improved.   Reviewed expectations re: course of current medical issues. Discussed self-management of symptoms. Outlined signs and symptoms indicating need for more acute intervention. Patient verbalized understanding and all questions were answered. Patient received an After-Visit Summary.    Orders Placed This Encounter  Procedures   POC COVID-19 BinaxNow   POCT rapid strep A   POCT Influenza A/B   No orders of the defined types were placed in this encounter.  Referral Orders  No referral(s) requested today     Note is dictated utilizing voice recognition software. Although note has been proof read prior to signing, occasional typographical errors still can be missed. If any questions arise, please do not hesitate to call for verification.   electronically signed by:  Howard Pouch, DO  [place]

## 2021-07-12 NOTE — Patient Instructions (Addendum)
Great to see you today.  I have refilled the medication(s) we provide.   If labs were collected, we will inform you of lab results once received either by echart message or telephone call.   - echart message- for normal results that have been seen by the patient already.   - telephone call: abnormal results or if patient has not viewed results in their echart.  Rest, hydrate.. Take over the counter mucinex DM for cough and runny nose.  Start doxycyline abx every 12 hours for 10 days with food.   Your flu, covid and strep test were negative today. I believe you have a bacterial bronchitis.   Acute Bronchitis, Adult Acute bronchitis is when air tubes in the lungs (bronchi) suddenly get swollen. The condition can make it hard for you to breathe. In adults, acute bronchitis usually goes away within 2 weeks. A cough caused by bronchitis may last up to 3 weeks. Smoking, allergies, and asthma can make the condition worse. What are the causes? Germs that cause cold and flu (viruses). The most common cause of this condition is the virus that causes the common cold. Bacteria. Substances that bother (irritate) the lungs, including: Smoke from cigarettes and other types of tobacco. Dust and pollen. Fumes from chemicals, gases, or burned fuel. Indoor or outdoor air pollution. What increases the risk? A weak body's defense system. This is also called the immune system. Any condition that affects your lungs and breathing, such as asthma. What are the signs or symptoms? A cough. Coughing up clear, yellow, or green mucus. Making high-pitched whistling sounds when you breathe, most often when you breathe out (wheezing). Runny or stuffy nose. Having too much mucus in your lungs (chest congestion). Shortness of breath. Body aches. A sore throat. How is this treated? Acute bronchitis may go away over time without treatment. Your doctor may tell you to: Drink more fluids. This will help thin your  mucus so it is easier to cough up. Use a device that gets medicine into your lungs (inhaler). Use a vaporizer or a humidifier. These are machines that add water to the air. This helps with coughing and poor breathing. Take a medicine that thins mucus and helps clear it from your lungs. Take a medicine that prevents or stops coughing. It is not common to take an antibiotic medicine for this condition. Follow these instructions at home:  Take over-the-counter and prescription medicines only as told by your doctor. Use an inhaler, vaporizer, or humidifier as told by your doctor. Take two teaspoons (10 mL) of honey at bedtime. This helps lessen your coughing at night. Drink enough fluid to keep your pee (urine) pale yellow. Do not smoke or use any products that contain nicotine or tobacco. If you need help quitting, ask your doctor. Get a lot of rest. Return to your normal activities when your doctor says that it is safe. Keep all follow-up visits. How is this prevented?  Wash your hands often with soap and water for at least 20 seconds. If you cannot use soap and water, use hand sanitizer. Avoid contact with people who have cold symptoms. Try not to touch your mouth, nose, or eyes with your hands. Avoid breathing in smoke or chemical fumes. Make sure to get the flu shot every year. Contact a doctor if: Your symptoms do not get better in 2 weeks. You have trouble coughing up the mucus. Your cough keeps you awake at night. You have a fever. Get help right away  if: You cough up blood. You have chest pain. You have very bad shortness of breath. You faint or keep feeling like you are going to faint. You have a very bad headache. Your fever or chills get worse. These symptoms may be an emergency. Get help right away. Call your local emergency services (911 in the [place] Do not wait to see if the symptoms will go away. Do not drive yourself to the hospital. Summary Acute bronchitis is  when air tubes in the lungs (bronchi) suddenly get swollen. In adults, acute bronchitis usually goes away within 2 weeks. Drink more fluids. This will help thin your mucus so it is easier to cough up. Take over-the-counter and prescription medicines only as told by your doctor. Contact a doctor if your symptoms do not improve after 2 weeks of treatment. This information is not intended to replace advice given to you by your health care provider. Make sure you discuss any questions you have with your health care provider. Document Revised: 10/07/2020 Document Reviewed: 10/07/2020 Elsevier Patient Education  [place].

## 2021-07-19 ENCOUNTER — Telehealth: Payer: Self-pay | Admitting: Family Medicine

## 2021-07-19 NOTE — Telephone Encounter (Signed)
FYI:  Pt saw Dr. Kirtland Bouchard weeks ago, wanted to know can she have her medication changed, still having congestion.--[facility] [address], [place] - [place]  [address] Phone:  [phone]  Fax:  [phone]

## 2021-07-20 NOTE — Telephone Encounter (Signed)
Pt informed of the following info from Dr. Lucita Lora note: If cough present it can last up to 6-8 weeks.  F/U 2 weeks if not improved.    Pt will CB at end of week to sched appt if not feeling better.

## 2021-07-26 ENCOUNTER — Ambulatory Visit: Payer: Medicare PPO | Admitting: Family Medicine

## 2021-09-01 ENCOUNTER — Ambulatory Visit: Payer: Medicare PPO

## 2021-09-08 ENCOUNTER — Other Ambulatory Visit: Payer: Self-pay | Admitting: Family Medicine

## 2021-09-15 ENCOUNTER — Other Ambulatory Visit: Payer: Self-pay

## 2021-09-15 ENCOUNTER — Ambulatory Visit (INDEPENDENT_AMBULATORY_CARE_PROVIDER_SITE_OTHER): Payer: Medicare PPO

## 2021-09-15 DIAGNOSIS — Z0000 Encounter for general adult medical examination without abnormal findings: Secondary | ICD-10-CM

## 2021-09-15 NOTE — Progress Notes (Signed)
Virtual Visit via Telephone Note ? ?I connected with  Melissa [name] on 09/15/21 at  9:30 AM EDT by telephone and verified that I am speaking with the correct person using two identifiers. ? ?Medicare Annual Wellness visit completed telephonically due to Covid-19 pandemic.  ? ?Persons participating in this call: This Health Coach and this patient.  ? ?Location: ?Patient: Home ?Provider: Office ?  ?I discussed the limitations, risks, security and privacy concerns of performing an evaluation and management service by telephone and the availability of in person appointments. The patient expressed understanding and agreed to proceed. ? ?Unable to perform video visit due to video visit attempted and failed and/or patient does not have video capability.  ? ?Some vital signs may be absent or patient reported.  ? ?Willette Brace, LPN ? ? ?Subjective:  ? Melissa [name] is a 87 y.o. female who presents for Medicare Annual (Subsequent) preventive examination. ? ?Review of Systems    ? ?Cardiac Risk Factors include: advanced age (>88men, >63 women);diabetes mellitus;dyslipidemia ? ?   ?Objective:  ?  ?There were no vitals filed for this visit. ?There is no height or weight on file to calculate BMI. ? ? ?  09/15/2021  ?  9:38 AM 08/26/2020  ? 12:48 PM 08/31/2016  ?  9:53 AM  ?Advanced Directives  ?Does Patient Have a Medical Advance Directive? Yes Yes Yes  ?Type of Paramedic of [place];Living will   ?Copy of [place] in Chart? No - copy requested No - copy requested   ? ? ?Current Medications (verified) ?Outpatient Encounter Medications as of 09/15/2021  ?Medication Sig  ? aspirin 81 MG tablet Take 81 mg by mouth daily.  ? atorvastatin (LIPITOR) 40 MG tablet Take 1 tablet (40 mg total) by mouth daily.  ? benzonatate (TESSALON) 200 MG capsule Take 1 capsule (200 mg total) by mouth 2 (two) times daily as needed for cough.  ? Calcium Carbonate-Vitamin D (OYSTER  SHELL CALCIUM/D) 250-125 MG-UNIT TABS Take by mouth.  ? cetirizine (ZYRTEC) 5 MG tablet Take 1 tablet (5 mg total) by mouth at bedtime.  ? doxycycline (VIBRA-TABS) 100 MG tablet Take 1 tablet (100 mg total) by mouth 2 (two) times daily.  ? escitalopram (LEXAPRO) 5 MG tablet Take 1 tablet (5 mg total) by mouth daily.  ? gabapentin (NEURONTIN) 300 MG capsule Take 1 capsule (300 mg total) by mouth at bedtime.  ? metFORMIN (GLUCOPHAGE) 500 MG tablet 500 mg (one tablet) in the morning and 1,000 mg (2 tablets) at night  ? metoprolol succinate (TOPROL XL) 25 MG 24 hr tablet Take 1 tablet (25 mg total) by mouth daily.  ? Multiple Vitamin (MULTIVITAMIN) tablet Take 1 tablet by mouth daily.  ? pantoprazole (PROTONIX) 40 MG tablet Take 1 tablet (40 mg total) by mouth daily.  ? tiZANidine (ZANAFLEX) 4 MG tablet TAKE ONE TABLET BY MOUTH AT BEDTIME FOR MUSCLE SPASM PRN  ? ?No facility-administered encounter medications on file as of 09/15/2021.  ? ? ?Allergies (verified) ?Patient has no known allergies.  ? ?History: ?Past Medical History:  ?Diagnosis Date  ? Burning tongue syndrome 11/11/2015  ? Colon polyps   ? Cystocele with prolapse 2019  ? Dr. Hulan Fray- Monitor for now.   ? Detached retina, right   ? Diabetes mellitus   ? Diaphragmatic hernia   ? GERD (gastroesophageal reflux disease)   ? Hyperlipidemia   ? Insomnia   ? Macular degeneration   ? ?  Past Surgical History:  ?Procedure Laterality Date  ? ABDOMINAL HYSTERECTOMY    ? BREAST CYST ASPIRATION  11/03/2020  ? FOOT SURGERY    ? OOPHORECTOMY    ? Rt.ovary removed with Hyst  ? RETINAL DETACHMENT SURGERY    ? TONSILLECTOMY    ? ?Family History  ?Problem Relation Age of Onset  ? Heart disease Other   ? Breast cancer Other   ? Breast cancer Mother   ?     metastatic--to bone  ? CAD Father   ? Cancer Maternal Grandmother   ?     unknown  ? Cancer Paternal Grandmother   ?     colon  ? ?Social History  ? ?Socioeconomic History  ? Marital status: Widowed  ?  Spouse name: Not on file  ?  Number of children: Not on file  ? Years of education: Not on file  ? Highest education level: Not on file  ?Occupational History  ? Not on file  ?Tobacco Use  ? Smoking status: Never  ? Smokeless tobacco: Never  ?Vaping Use  ? Vaping Use: Never used  ?Substance and Sexual Activity  ? Alcohol use: No  ? Drug use: No  ? Sexual activity: Not Currently  ?  Birth control/protection: Surgical  ?  Comment: Hyst--Lt.ovary remains  ?Other Topics Concern  ? Not on file  ?Social History Narrative  ? Marital status/children/pets: Widowed.  ? Education/employment: 12th grade education.  Retired.  ? Safety:   ?   -smoke alarm in the home:Yes  ?   - wears seatbelt: Yes  ?   - Feels safe in their relationships: Yes  ? ?Social Determinants of Health  ? ?Financial Resource Strain: Low Risk   ? Difficulty of Paying Living Expenses: Not hard at all  ?Food Insecurity: No Food Insecurity  ? Worried About Charity fundraiser in the Last Year: Never true  ? Ran Out of Food in the Last Year: Never true  ?Transportation Needs: No Transportation Needs  ? Lack of Transportation (Medical): No  ? Lack of Transportation (Non-Medical): No  ?Physical Activity: Insufficiently Active  ? Days of Exercise per Week: 7 days  ? Minutes of Exercise per Session: 20 min  ?Stress: No Stress Concern Present  ? Feeling of Stress : Not at all  ?Social Connections: Moderately Isolated  ? Frequency of Communication with Friends and Family: More than three times a week  ? Frequency of Social Gatherings with Friends and Family: More than three times a week  ? Attends Religious Services: More than 4 times per year  ? Active Member of Clubs or Organizations: No  ? Attends Archivist Meetings: Never  ? Marital Status: Widowed  ? ? ?Tobacco Counseling ?Counseling given: Not Answered ? ? ?Clinical Intake: ? ?Pre-visit preparation completed: Yes ? ?Pain : No/denies pain ? ?  ? ?BMI - recorded: 25.04 ?Nutritional Status: BMI 25 -29 Overweight ?Nutritional Risks:  None ?Diabetes: Yes ?CBG done?: No ?Did pt. bring in CBG monitor from home?: No ? ?How often do you need to have someone help you when you read instructions, pamphlets, or other written materials from your doctor or pharmacy?: 1 - Never ? ?Diabetic?Nutrition Risk Assessment: ? ?Has the patient had any N/V/D within the last 2 months?  No  ?Does the patient have any non-healing wounds?  No  ?Has the patient had any unintentional weight loss or weight gain?  No  ? ?Diabetes: ? ?Is the patient diabetic?  Yes  ?If diabetic, was a CBG obtained today?  No  ?Did the patient bring in their glucometer from home?  No  ?How often do you monitor your CBG's? Daily.  ? ?Financial Strains and Diabetes Management: ? ?Are you having any financial strains with the device, your supplies or your medication? No .  ?Does the patient want to be seen by Chronic Care Management for management of their diabetes?  No  ?Would the patient like to be referred to a Nutritionist or for Diabetic Management?  No  ? ?Diabetic Exams: ? ?Diabetic Eye Exam: Overdue for diabetic eye exam. Pt has been advised about the importance in completing this exam. Patient advised to call and schedule an eye exam. ?Diabetic Foot Exam: Completed 07/22/20 ? ? ?Interpreter Needed?: No ? ?Information entered by :: Charlott Rakes, LPN ? ? ?Activities of Daily Living ? ?  09/15/2021  ?  9:39 AM  ?In your present state of health, do you have any difficulty performing the following activities:  ?Hearing? 1  ?Vision? 0  ?Difficulty concentrating or making decisions? 0  ?Walking or climbing stairs? 0  ?Dressing or bathing? 0  ?Doing errands, shopping? 0  ?Preparing Food and eating ? N  ?Using the Toilet? N  ?In the past six months, have you accidently leaked urine? Y  ?Comment at times and wears a pantyliner  ?Do you have problems with loss of bowel control? N  ?Managing your Medications? N  ?Managing your Finances? N  ?Housekeeping or managing your Housekeeping? N  ? ? ?Patient  Care Team: ?[name], DO as PCP - General (Family Medicine) ?Rankin, Clent Demark, MD as Consulting Physician (Ophthalmology) ?[facility] (Ophthalmology) ?Emily Filbert, MD ([facility]

## 2021-09-15 NOTE — Patient Instructions (Signed)
Ms. Perelman , ?Thank you for taking time to come for your Medicare Wellness Visit. I appreciate your ongoing commitment to your health goals. Please review the following plan we discussed and let me know if I can assist you in the future.  ? ?Screening recommendations/referrals: ?Colonoscopy: No longer required  ?Mammogram: Done 11/03/20 repeat every year  ?Recommended yearly ophthalmology/optometry visit for glaucoma screening and checkup ?Recommended yearly dental visit for hygiene and checkup ? ?Vaccinations: ?Influenza vaccine: Done 04/13/21 repeat every year  ?Pneumococcal vaccine: Up to date ?Tdap vaccine: Done 01/06/21 repeat every 10 years  ?Shingles vaccine: Completed 05/21/19 & 07/23/19   ?Covid-19:Completed 3/3, 3/31, 04/15/20, 10/13/20 & 06/29/21 ? ?Advanced directives: Please bring a copy of your health care power of attorney and living will to the office at your convenience. ? ? ?Conditions/risks identified: keep on moving  ? ?Next appointment: Follow up in one year for your annual wellness visit  ? ? ?Preventive Care 82 Years and Older, Female ?Preventive care refers to lifestyle choices and visits with your health care provider that can promote health and wellness. ?What does preventive care include? ?A yearly physical exam. This is also called an annual well check. ?Dental exams once or twice a year. ?Routine eye exams. Ask your health care provider how often you should have your eyes checked. ?Personal lifestyle choices, including: ?Daily care of your teeth and gums. ?Regular physical activity. ?Eating a healthy diet. ?Avoiding tobacco and drug use. ?Limiting alcohol use. ?Practicing safe sex. ?Taking low-dose aspirin every day. ?Taking vitamin and mineral supplements as recommended by your health care provider. ?What happens during an annual well check? ?The services and screenings done by your health care provider during your annual well check will depend on your age, overall health, lifestyle risk  factors, and family history of disease. ?Counseling  ?Your health care provider may ask you questions about your: ?Alcohol use. ?Tobacco use. ?Drug use. ?Emotional well-being. ?Home and relationship well-being. ?Sexual activity. ?Eating habits. ?History of falls. ?Memory and ability to understand (cognition). ?Work and work Statistician. ?Reproductive health. ?Screening  ?You may have the following tests or measurements: ?Height, weight, and BMI. ?Blood pressure. ?Lipid and cholesterol levels. These may be checked every 5 years, or more frequently if you are over 31 years old. ?Skin check. ?Lung cancer screening. You may have this screening every year starting at age 40 if you have a 30-pack-year history of smoking and currently smoke or have quit within the past 15 years. ?Fecal occult blood test (FOBT) of the stool. You may have this test every year starting at age 29. ?Flexible sigmoidoscopy or colonoscopy. You may have a sigmoidoscopy every 5 years or a colonoscopy every 10 years starting at age 59. ?Hepatitis C blood test. ?Hepatitis B blood test. ?Sexually transmitted disease (STD) testing. ?Diabetes screening. This is done by checking your blood sugar (glucose) after you have not eaten for a while (fasting). You may have this done every 1-3 years. ?Bone density scan. This is done to screen for osteoporosis. You may have this done starting at age 50. ?Mammogram. This may be done every 1-2 years. Talk to your health care provider about how often you should have regular mammograms. ?Talk with your health care provider about your test results, treatment options, and if necessary, the need for more tests. ?Vaccines  ?Your health care provider may recommend certain vaccines, such as: ?Influenza vaccine. This is recommended every year. ?Tetanus, diphtheria, and acellular pertussis (Tdap, Td) vaccine. You may need  a Td booster every 10 years. ?Zoster vaccine. You may need this after age 5. ?Pneumococcal 13-valent  conjugate (PCV13) vaccine. One dose is recommended after age 42. ?Pneumococcal polysaccharide (PPSV23) vaccine. One dose is recommended after age 46. ?Talk to your health care provider about which screenings and vaccines you need and how often you need them. ?This information is not intended to replace advice given to you by your health care provider. Make sure you discuss any questions you have with your health care provider. ?Document Released: 07/03/2015 Document Revised: 02/24/2016 Document Reviewed: 04/07/2015 ?Elsevier Interactive Patient Education ? 2017 [employer]. ? ?Fall Prevention in the Home ?Falls can cause injuries. They can happen to people of all ages. There are many things you can do to make your home safe and to help prevent falls. ?What can I do on the outside of my home? ?Regularly fix the edges of walkways and driveways and fix any cracks. ?Remove anything that might make you trip as you walk through a door, such as a raised step or threshold. ?Trim any bushes or trees on the path to your home. ?Use bright outdoor lighting. ?Clear any walking paths of anything that might make someone trip, such as rocks or tools. ?Regularly check to see if handrails are loose or broken. Make sure that both sides of any steps have handrails. ?Any raised decks and porches should have guardrails on the edges. ?Have any leaves, snow, or ice cleared regularly. ?Use sand or salt on walking paths during winter. ?Clean up any spills in your garage right away. This includes oil or grease spills. ?What can I do in the bathroom? ?Use night lights. ?Install grab bars by the toilet and in the tub and shower. Do not use towel bars as grab bars. ?Use non-skid mats or decals in the tub or shower. ?If you need to sit down in the shower, use a plastic, non-slip stool. ?Keep the floor dry. Clean up any water that spills on the floor as soon as it happens. ?Remove soap buildup in the tub or shower regularly. ?Attach bath mats  securely with double-sided non-slip rug tape. ?Do not have throw rugs and other things on the floor that can make you trip. ?What can I do in the bedroom? ?Use night lights. ?Make sure that you have a light by your bed that is easy to reach. ?Do not use any sheets or blankets that are too big for your bed. They should not hang down onto the floor. ?Have a firm chair that has side arms. You can use this for support while you get dressed. ?Do not have throw rugs and other things on the floor that can make you trip. ?What can I do in the kitchen? ?Clean up any spills right away. ?Avoid walking on wet floors. ?Keep items that you use a lot in easy-to-reach places. ?If you need to reach something above you, use a strong step stool that has a grab bar. ?Keep electrical cords out of the way. ?Do not use floor polish or wax that makes floors slippery. If you must use wax, use non-skid floor wax. ?Do not have throw rugs and other things on the floor that can make you trip. ?What can I do with my stairs? ?Do not leave any items on the stairs. ?Make sure that there are handrails on both sides of the stairs and use them. Fix handrails that are broken or loose. Make sure that handrails are as long as the stairways. ?Check  any carpeting to make sure that it is firmly attached to the stairs. Fix any carpet that is loose or worn. ?Avoid having throw rugs at the top or bottom of the stairs. If you do have throw rugs, attach them to the floor with carpet tape. ?Make sure that you have a light switch at the top of the stairs and the bottom of the stairs. If you do not have them, ask someone to add them for you. ?What else can I do to help prevent falls? ?Wear shoes that: ?Do not have high heels. ?Have rubber bottoms. ?Are comfortable and fit you well. ?Are closed at the toe. Do not wear sandals. ?If you use a stepladder: ?Make sure that it is fully opened. Do not climb a closed stepladder. ?Make sure that both sides of the stepladder  are locked into place. ?Ask someone to hold it for you, if possible. ?Clearly mark and make sure that you can see: ?Any grab bars or handrails. ?First and last steps. ?Where the edge of each step is. ?Use tools t

## 2021-09-16 ENCOUNTER — Other Ambulatory Visit: Payer: Self-pay | Admitting: Family Medicine

## 2021-09-23 ENCOUNTER — Other Ambulatory Visit: Payer: Self-pay | Admitting: Adult Health

## 2021-11-24 ENCOUNTER — Other Ambulatory Visit: Payer: Self-pay | Admitting: Cardiovascular Disease

## 2021-11-24 ENCOUNTER — Other Ambulatory Visit: Payer: Self-pay | Admitting: Adult Health

## 2021-11-24 NOTE — Progress Notes (Signed)
Cardiology Clinic Note   Patient Name: Melissa [name] Date of Encounter: 11/26/2021  Primary Care Provider:  [name], DO Primary Cardiologist: Dr. Claiborne Billings  Patient Profile    87 year old female patient with known history of chronic chest discomfort, palpitations, hyperlipidemia, GERD, type 2 diabetes, and anxiety.  At last office visit on 07/31/2020 she was able to point to a spot on the left side of her chest, and the pain was somewhat reproducible.  She was to be treated medically.  With no adjustments in medications in the last office visit she was advised to avoid caffeine and stay hydrated as she was found to be slightly tachycardic during office visit.  Past Medical History    Past Medical History:  Diagnosis Date   Burning tongue syndrome 11/11/2015   Colon polyps    Cystocele with prolapse 2019   Dr. Hulan Fray- Monitor for now.    Detached retina, right    Diabetes mellitus    Diaphragmatic hernia    GERD (gastroesophageal reflux disease)    Hyperlipidemia    Insomnia    Macular degeneration    Past Surgical History:  Procedure Laterality Date   ABDOMINAL HYSTERECTOMY     BREAST CYST ASPIRATION  11/03/2020   FOOT SURGERY     OOPHORECTOMY     Rt.ovary removed with Hyst   RETINAL DETACHMENT SURGERY     TONSILLECTOMY      Allergies  No Known Allergies  History of Present Illness    Melissa [name] returns today for ongoing assessment and management of chest discomfort, tachycardia, hypertension, with history of type 2 diabetes and anxiety which induces palpitations.  She did have some complaints of some shortness of breath and wheezing which has passed.  She denies any chest pain today.  She is physically active but does have a torn rotator cuff on the right.  She is favoring that right arm.  Home Medications    Current Outpatient Medications  Medication Sig Dispense Refill   aspirin 81 MG tablet Take 81 mg by mouth daily.     benzonatate (TESSALON) 200 MG capsule  Take 1 capsule (200 mg total) by mouth 2 (two) times daily as needed for cough. 20 capsule 0   Calcium Carbonate-Vitamin D (OYSTER SHELL CALCIUM/D) 250-125 MG-UNIT TABS Take by mouth.     cetirizine (ZYRTEC) 5 MG tablet Take 1 tablet (5 mg total) by mouth at bedtime. 90 tablet 1   escitalopram (LEXAPRO) 5 MG tablet Take 1 tablet (5 mg total) by mouth daily. 90 tablet 1   gabapentin (NEURONTIN) 300 MG capsule Take 1 capsule (300 mg total) by mouth at bedtime. 90 capsule 1   metFORMIN (GLUCOPHAGE) 500 MG tablet 500 mg (one tablet) in the morning and 1,000 mg (2 tablets) at night 270 tablet 1   Multiple Vitamin (MULTIVITAMIN) tablet Take 1 tablet by mouth daily.     pantoprazole (PROTONIX) 40 MG tablet Take 1 tablet (40 mg total) by mouth daily. 90 tablet 3   tiZANidine (ZANAFLEX) 4 MG tablet TAKE ONE TABLET BY MOUTH AT BEDTIME FOR MUSCLE SPASM PRN 90 tablet 1   atorvastatin (LIPITOR) 40 MG tablet Take 1 tablet (40 mg total) by mouth daily. 90 tablet 4   metoprolol succinate (TOPROL-XL) 25 MG 24 hr tablet Take 1 tablet (25 mg total) by mouth daily. KEEP UPCOMING APPOINTMENT FOR FUTURE REFILLS. 90 tablet 4   No current facility-administered medications for this visit.     Family History  Family History  Problem Relation Age of Onset   Heart disease Other    Breast cancer Other    Breast cancer Mother        metastatic--to bone   CAD Father    Cancer Maternal Grandmother        unknown   Cancer Paternal Grandmother        colon   She indicated that her mother is deceased. She indicated that her father is deceased. She indicated that both of her sisters are deceased. She indicated that her brother is alive. She indicated that her maternal grandmother is deceased. She indicated that her maternal grandfather is deceased. She indicated that her paternal grandmother is deceased. She indicated that her paternal grandfather is deceased.  Social History    Social History   Socioeconomic  History   Marital status: Widowed    Spouse name: Not on file   Number of children: Not on file   Years of education: Not on file   Highest education level: Not on file  Occupational History   Not on file  Tobacco Use   Smoking status: Never   Smokeless tobacco: Never  Vaping Use   Vaping Use: Never used  Substance and Sexual Activity   Alcohol use: No   Drug use: No   Sexual activity: Not Currently    Birth control/protection: Surgical    Comment: Hyst--Lt.ovary remains  Other Topics Concern   Not on file  Social History Narrative   Marital status/children/pets: Widowed.   Education/employment: 12th grade education.  Retired.   Safety:      -smoke alarm in the home:Yes     - wears seatbelt: Yes     - Feels safe in their relationships: Yes   Social Determinants of Health   Financial Resource Strain: Low Risk  (09/15/2021)   Overall Financial Resource Strain (CARDIA)    Difficulty of Paying Living Expenses: Not hard at all  Food Insecurity: No Food Insecurity (09/15/2021)   Hunger Vital Sign    Worried About Running Out of Food in the Last Year: Never true    Ran Out of Food in the Last Year: Never true  Transportation Needs: No Transportation Needs (09/15/2021)   PRAPARE - Hydrologist (Medical): No    Lack of Transportation (Non-Medical): No  Physical Activity: Insufficiently Active (09/15/2021)   Exercise Vital Sign    Days of Exercise per Week: 7 days    Minutes of Exercise per Session: 20 min  Stress: No Stress Concern Present (09/15/2021)   [place]    Feeling of Stress : Not at all  Social Connections: Moderately Isolated (09/15/2021)   Social Connection and Isolation Panel [NHANES]    Frequency of Communication with Friends and Family: More than three times a week    Frequency of Social Gatherings with Friends and Family: More than three times a week    Attends  Religious Services: More than 4 times per year    Active Member of [employer] or Organizations: No    Attends Archivist Meetings: Never    Marital Status: Widowed  Intimate Partner Violence: Not At Risk (09/15/2021)   Humiliation, Afraid, Rape, and Kick questionnaire    Fear of Current or Ex-Partner: No    Emotionally Abused: No    Physically Abused: No    Sexually Abused: No     Review of Systems    General:  No chills, fever, night sweats or weight changes.  Cardiovascular:  No chest pain, dyspnea on exertion, edema, orthopnea, palpitations, paroxysmal nocturnal dyspnea. Dermatological: No rash, lesions/masses Respiratory: No cough, dyspnea Urologic: No hematuria, dysuria Abdominal:   No nausea, vomiting, diarrhea, bright red blood per rectum, melena, or hematemesis Neurologic:  No visual changes, wkns, changes in mental status. All other systems reviewed and are otherwise negative except as noted above.     Physical Exam    VS:  BP 128/72   Pulse 84   Ht _0  (1.6 m)   Wt 126 lb (57.2 kg)   SpO2 96%   BMI 22.32 kg/m  , BMI Body mass index is 22.32 kg/m.     GEN: Well nourished, well developed, in no acute distress. HEENT: normal. Neck: Supple, no JVD, carotid bruits, or masses. Cardiac: RRR, 1/6 murmurs, rubs, or gallops. No clubbing, cyanosis, edema.  Radials/DP/PT 2+ and equal bilaterally.  Respiratory:  Respirations regular and unlabored, clear to auscultation bilaterally. GI: Soft, nontender, nondistended, BS + x 4. MS: no deformity or atrophy.  Right shoulder pain especially with ROM Skin: warm and dry, no rash. Neuro:  Strength and sensation are intact. Psych: Normal affect.  Accessory Clinical Findings    ECG personally reviewed by me today-normal sinus rhythm with right bundle branch block heart rate is 84 bpm- No acute changes  Lab Results  Component Value Date   WBC 5.5 12/30/2020   HGB 13.0 12/30/2020   HCT 39.5 12/30/2020   MCV 93.4  12/30/2020   PLT 281.0 12/30/2020   Lab Results  Component Value Date   CREATININE 1.00 12/30/2020   BUN 16 12/30/2020   NA 139 12/30/2020   K 5.0 12/30/2020   CL 102 12/30/2020   CO2 29 12/30/2020   Lab Results  Component Value Date   ALT 8 12/30/2020   AST 14 12/30/2020   ALKPHOS 64 12/30/2020   BILITOT 0.5 12/30/2020   Lab Results  Component Value Date   CHOL 193 12/30/2020   HDL 69.50 12/30/2020   LDLCALC 94 12/30/2020   LDLDIRECT 112.6 01/17/2012   TRIG 145.0 12/30/2020   CHOLHDL 3 12/30/2020    Lab Results  Component Value Date   HGBA1C 6.4 (A) 06/16/2021   HGBA1C 6.4 06/16/2021   HGBA1C 6.4 06/16/2021   HGBA1C 6.4 06/16/2021    Review of Prior Studies: Echocardiogram 10/03/2017 Left ventricle: The cavity size was normal. Wall thickness was    increased in a pattern of moderate LVH. Systolic function was    vigorous. The estimated ejection fraction was in the range of 65%    to 70%. Wall motion was normal; there were no regional wall    motion abnormalities. The study is not technically sufficient to    allow evaluation of LV diastolic function.  - Mitral valve: Mild functional MS by valve area with severe MAC    encroaching on basal and mid leaflets. Severely calcified    annulus. Moderately thickened, moderately calcified leaflets .    The findings are consistent with mild stenosis.  - Atrial septum: No defect or patent foramen ovale was identified.   Carotid Doppler Studies 07/03/2017  Right Carotid: There is evidence in the right ICA of a 1-39% stenosis.  Non-hemodynamically significant plaque <50% noted in the  CCA.   Left Carotid: There is evidence in the left ICA of a 1-39% stenosis.   Vertebrals:  Both vertebral arteries were patent with antegrade flow.  Subclavians: Normal  flow hemodynamics were seen in bilateral subclavian               arteries.   Assessment & Plan   1.  Chronic chest discomfort: The patient denies any chest discomfort at  this time.  She is currently complaining of right shoulder pain related to rotator cuff tear.  No plans for ischemic testing at this time.  2.  Palpitations: She will remain on metoprolol succinate 25 mg daily and refills are provided today with refills.  She denies any new symptoms of palpitations and will not make changes in her dosing as this appears to be keeping it under control.  3.  Hypercholesterolemia: Continue current statin dose.  Labs are being followed by primary care.  If not completed on follow-up in 1 year, these will need to be drawn.  4.  Right shoulder rotator cuff tear: There is discussion about whether or not to proceed with surgical repair.  Date has not been established.  She is following up with orthopedics.  She appears stable from a cardiovascular standpoint and could undergo surgical repair if necessary without any changes in medication regimen or discontinuation of aspirin unless requested by surgeon.  Current medicines are reviewed at length with the patient today.  I have spent 25 min's  dedicated to the care of this patient on the date of this encounter to include pre-visit review of records, assessment, management and diagnostic testing,with shared decision making. Signed, Phill Myron. West Pugh, ANP, AACC   11/26/2021 12:19 PM    [place] [facility] [address] Office [phone] Fax [phone]  Notice: This dictation was prepared with Dragon dictation along with smaller phrase technology. Any transcriptional errors that result from this process are unintentional and may not be corrected upon review.

## 2021-11-26 ENCOUNTER — Encounter: Payer: Self-pay | Admitting: Adult Health

## 2021-11-26 ENCOUNTER — Ambulatory Visit (INDEPENDENT_AMBULATORY_CARE_PROVIDER_SITE_OTHER): Payer: Medicare PPO | Admitting: Adult Health

## 2021-11-26 VITALS — BP 128/72 | HR 84 | Ht 63.0 in | Wt 126.0 lb

## 2021-11-26 DIAGNOSIS — E7800 Pure hypercholesterolemia, unspecified: Secondary | ICD-10-CM

## 2021-11-26 DIAGNOSIS — G8929 Other chronic pain: Secondary | ICD-10-CM

## 2021-11-26 DIAGNOSIS — I10 Essential (primary) hypertension: Secondary | ICD-10-CM | POA: Diagnosis not present

## 2021-11-26 DIAGNOSIS — R002 Palpitations: Secondary | ICD-10-CM | POA: Diagnosis not present

## 2021-11-26 DIAGNOSIS — M25511 Pain in right shoulder: Secondary | ICD-10-CM | POA: Diagnosis not present

## 2021-11-26 MED ORDER — ATORVASTATIN CALCIUM 40 MG PO TABS: 40.0000 mg | ORAL_TABLET | Freq: Every day | ORAL | 4 refills | Status: DC

## 2021-11-26 MED ORDER — METOPROLOL SUCCINATE ER 25 MG PO TB24: 25.0000 mg | ORAL_TABLET | Freq: Every day | ORAL | 4 refills | Status: DC

## 2021-11-26 NOTE — Patient Instructions (Signed)
Medication Instructions:  No Changes *If you need a refill on your cardiac medications before your next appointment, please call your pharmacy*   Lab Work: No labs If you have labs (blood work) drawn today and your tests are completely normal, you will receive your results only by: [place] (if you have MyChart) OR A paper copy in the mail If you have any lab test that is abnormal or we need to change your treatment, we will call you to review the results.   Testing/Procedures: No Testing   Follow-Up: At [facility], you and your health needs are our priority.  As part of our continuing mission to provide you with exceptional heart care, we have created designated Provider Care Teams.  These Care Teams include your primary Cardiologist (physician) and Advanced Practice Providers (APPs -  Physician Assistants and Nurse Practitioners) who all work together to provide you with the care you need, when you need it.  We recommend signing up for the patient portal called "MyChart".  Sign up information is provided on this After Visit Summary.  MyChart is used to connect with patients for Virtual Visits (Telemedicine).  Patients are able to view lab/test results, encounter notes, upcoming appointments, etc.  Non-urgent messages can be sent to your provider as well.   To learn more about what you can do with MyChart, go to [email or web address].    Your next appointment:   1 year(s)  The format for your next appointment:   In Person  Provider:   Shelva Majestic, MD      Important Information About Sugar

## 2021-12-01 ENCOUNTER — Ambulatory Visit (INDEPENDENT_AMBULATORY_CARE_PROVIDER_SITE_OTHER): Payer: Medicare PPO | Admitting: Family Medicine

## 2021-12-01 ENCOUNTER — Encounter: Payer: Self-pay | Admitting: Family Medicine

## 2021-12-01 VITALS — BP 99/61 | HR 80 | Temp 98.2°F | Ht 63.0 in | Wt 126.0 lb

## 2021-12-01 DIAGNOSIS — M546 Pain in thoracic spine: Secondary | ICD-10-CM

## 2021-12-01 DIAGNOSIS — F419 Anxiety disorder, unspecified: Secondary | ICD-10-CM

## 2021-12-01 DIAGNOSIS — E1169 Type 2 diabetes mellitus with other specified complication: Secondary | ICD-10-CM

## 2021-12-01 DIAGNOSIS — E113213 Type 2 diabetes mellitus with mild nonproliferative diabetic retinopathy with macular edema, bilateral: Secondary | ICD-10-CM | POA: Diagnosis not present

## 2021-12-01 DIAGNOSIS — G8929 Other chronic pain: Secondary | ICD-10-CM

## 2021-12-01 DIAGNOSIS — D509 Iron deficiency anemia, unspecified: Secondary | ICD-10-CM

## 2021-12-01 DIAGNOSIS — E785 Hyperlipidemia, unspecified: Secondary | ICD-10-CM

## 2021-12-01 DIAGNOSIS — I739 Peripheral vascular disease, unspecified: Secondary | ICD-10-CM

## 2021-12-01 DIAGNOSIS — K219 Gastro-esophageal reflux disease without esophagitis: Secondary | ICD-10-CM | POA: Diagnosis not present

## 2021-12-01 LAB — CBC
HCT: 40.2 % (ref 36.0–46.0)
Hemoglobin: 13 g/dL (ref 12.0–15.0)
MCHC: 32.4 g/dL (ref 30.0–36.0)
MCV: 93.9 fl (ref 78.0–100.0)
Platelets: 255 10*3/uL (ref 150.0–400.0)
RBC: 4.28 Mil/uL (ref 3.87–5.11)
RDW: 14.2 % (ref 11.5–15.5)
WBC: 6.6 10*3/uL (ref 4.0–10.5)

## 2021-12-01 LAB — COMPREHENSIVE METABOLIC PANEL
ALT: 8 U/L (ref 0–35)
AST: 12 U/L (ref 0–37)
Albumin: 4.3 g/dL (ref 3.5–5.2)
Alkaline Phosphatase: 74 U/L (ref 39–117)
BUN: 20 mg/dL (ref 6–23)
CO2: 28 mEq/L (ref 19–32)
Calcium: 9.7 mg/dL (ref 8.4–10.5)
Chloride: 103 mEq/L (ref 96–112)
Creatinine, Ser: 0.96 mg/dL (ref 0.40–1.20)
GFR: 54.11 mL/min — ABNORMAL LOW (ref >60.00)
Glucose, Bld: 104 mg/dL — ABNORMAL HIGH (ref 70–99)
Potassium: 5.1 mEq/L (ref 3.5–5.1)
Sodium: 139 mEq/L (ref 135–145)
Total Bilirubin: 0.7 mg/dL (ref 0.2–1.2)
Total Protein: 6.9 g/dL (ref 6.0–8.3)

## 2021-12-01 LAB — LIPID PANEL
Cholesterol: 166 mg/dL (ref 0–200)
HDL: 68.8 mg/dL (ref >39.00)
LDL Cholesterol: 72 mg/dL (ref 0–99)
NonHDL: 97.43
Total CHOL/HDL Ratio: 2
Triglycerides: 127 mg/dL (ref 0.0–149.0)
VLDL: 25.4 mg/dL (ref 0.0–40.0)

## 2021-12-01 LAB — MICROALBUMIN / CREATININE URINE RATIO
Creatinine,U: 106.9 mg/dL
Microalb Creat Ratio: 0.8 mg/g (ref 0.0–30.0)
Microalb, Ur: 0.8 mg/dL (ref 0.0–1.9)

## 2021-12-01 LAB — TSH: TSH: 1.31 u[IU]/mL (ref 0.35–5.50)

## 2021-12-01 LAB — HEMOGLOBIN A1C: Hgb A1c MFr Bld: 6.8 % — ABNORMAL HIGH (ref 4.6–6.5)

## 2021-12-01 MED ORDER — FLUOCINONIDE 0.05 % EX CREA
1.0000 | TOPICAL_CREAM | Freq: Two times a day (BID) | CUTANEOUS | 2 refills | Status: DC
Start: 2021-12-01 — End: 2022-05-17

## 2021-12-01 MED ORDER — TIZANIDINE HCL 4 MG PO TABS: ORAL_TABLET | ORAL | 1 refills | Status: DC

## 2021-12-01 MED ORDER — PANTOPRAZOLE SODIUM 40 MG PO TBEC: 40.0000 mg | DELAYED_RELEASE_TABLET | Freq: Every day | ORAL | 3 refills | Status: DC

## 2021-12-01 MED ORDER — GABAPENTIN 300 MG PO CAPS
300.0000 mg | ORAL_CAPSULE | Freq: Every day | ORAL | 1 refills | Status: DC
Start: 2021-12-01 — End: 2022-05-17

## 2021-12-01 MED ORDER — METFORMIN HCL 500 MG PO TABS: ORAL_TABLET | ORAL | 1 refills | Status: DC

## 2021-12-01 MED ORDER — ESCITALOPRAM OXALATE 5 MG PO TABS: 5.0000 mg | ORAL_TABLET | Freq: Every day | ORAL | 1 refills | Status: DC

## 2021-12-01 NOTE — Progress Notes (Signed)
Patient Care Team    Relationship Specialty Notifications Start End  [name], DO PCP - General Family Medicine  07/03/18   Zadie Rhine Clent Demark, MD Consulting Physician Ophthalmology  07/03/18   Emily Filbert, MD (Inactive) Consulting Physician Obstetrics and Gynecology  07/04/18   Troy Sine, MD Consulting Physician Cardiology  07/04/18   Nunzio Cobbs, MD Consulting Physician Obstetrics and Gynecology  02/18/20   Pllc, [facility]    12/01/21     SUBJECTIVE Chief Complaint  Patient presents with   Diabetes    [facility]; pt Is fasting    HPI: Melissa [name] is a 87 y.o. present for f/u [facility] she is fasting today Diabetes/macular degeneration/nonproliferative retinopathy:  Pt reports complaince  with metformin 500 mg in the morning and 1000 mg in the afternoon. Patient denies dizziness, hyperglycemic or hypoglycemic events. Patient denies numbness, tingling in the extremities or nonhealing wounds of feet. .  She is prescribed gabapentin 300 mg nightly for back pain.    Palpitations/hyperlipidemia/peripheral artery disease/anxiety: She reports compliance with her metoprolol 25 mg daily, lexapro 5 mg qd. She is prescribed Lipitor 40 mg daily and takes a daily baby aspirin.  Echo Doppler study on October 03, 2017 showed an EF of 65 to 70%.  There was severe mitral annular calcification with very mild functional MS by valve area. Patient denies chest pain, shortness of breath, dizziness or lower extremity edema.   GERD: Patient reports Her GERD is well controlled.  She is compliant with Protonix 40 mg daily.  She has been on this for quite some time and takes it daily. Without medication her symptoms return.   Chronic back pain  Patient reports compliance with gabapentin has been extremely helpful. She only occ.will use the Zanaflex and but make sure not to take at the same time as her gabapentin.  She deniesany oversedation.   ROS: See pertinent positives  and negatives per HPI.  Patient Active Problem List   Diagnosis Date Noted   Callus 10/21/2020   Early stage nonexudative age-related macular degeneration of left eye 04/13/2020   Advanced nonexudative age-related macular degeneration of right eye with subfoveal involvement 04/01/2020   Degenerative retinal drusen of left eye 04/01/2020   Pseudophakia, both eyes 04/01/2020   Posterior vitreous detachment of left eye 04/01/2020   Anxiety 03/05/2019   Chronic bilateral thoracic back pain 08/03/2018   Iron deficiency anemia 07/04/2018   Palpitations 07/04/2018   Peripheral arterial disease ([place]) 07/11/2017   Varicose veins of both lower extremities 08/31/2016   Macular degeneration 05/05/2015   Hiatal hernia 01/28/2015   Mild nonproliferative retinopathy of both eyes with macular edema associated with type 2 diabetes mellitus ([place]) 10/02/2013   Hyperlipidemia associated with type 2 diabetes mellitus ([place]) 03/24/2009   GERD 03/24/2009    Social History   Tobacco Use   Smoking status: Never   Smokeless tobacco: Never  Substance Use Topics   Alcohol use: No    Current Outpatient Medications:    aspirin 81 MG tablet, Take 81 mg by mouth daily., Disp: , Rfl:    atorvastatin (LIPITOR) 40 MG tablet, Take 1 tablet (40 mg total) by mouth daily., Disp: 90 tablet, Rfl: 4   Calcium Carbonate-Vitamin D (OYSTER SHELL CALCIUM/D) 250-125 MG-UNIT TABS, Take by mouth., Disp: , Rfl:    cetirizine (ZYRTEC) 5 MG tablet, Take 1 tablet (5 mg total) by mouth at bedtime., Disp: 90 tablet, Rfl: 1  fluocinonide cream (LIDEX) 7.06 %, Apply 1 application  topically 2 (two) times daily., Disp: 30 g, Rfl: 2   metoprolol succinate (TOPROL-XL) 25 MG 24 hr tablet, Take 1 tablet (25 mg total) by mouth daily. KEEP UPCOMING APPOINTMENT FOR FUTURE REFILLS., Disp: 90 tablet, Rfl: 4   Multiple Vitamin (MULTIVITAMIN) tablet, Take 1 tablet by mouth daily., Disp: , Rfl:    escitalopram (LEXAPRO) 5 MG tablet, Take 1 tablet  (5 mg total) by mouth daily., Disp: 90 tablet, Rfl: 1   gabapentin (NEURONTIN) 300 MG capsule, Take 1 capsule (300 mg total) by mouth at bedtime., Disp: 90 capsule, Rfl: 1   metFORMIN (GLUCOPHAGE) 500 MG tablet, 500 mg (one tablet) in the morning and 1,000 mg (2 tablets) at night, Disp: 270 tablet, Rfl: 1   pantoprazole (PROTONIX) 40 MG tablet, Take 1 tablet (40 mg total) by mouth daily., Disp: 90 tablet, Rfl: 3   tiZANidine (ZANAFLEX) 4 MG tablet, TAKE ONE TABLET BY MOUTH AT BEDTIME FOR MUSCLE SPASM PRN, Disp: 90 tablet, Rfl: 1  No Known Allergies  OBJECTIVE: BP 99/61   Pulse 80   Temp 98.2 F (36.8 C) (Oral)   Ht 5' 3" (1.6 m)   Wt 126 lb (57.2 kg)   SpO2 95%   BMI 22.32 kg/m  Physical Exam Vitals and nursing note reviewed.  Constitutional:      General: She is not in acute distress.    Appearance: Normal appearance. She is not ill-appearing, toxic-appearing or diaphoretic.  HENT:     Head: Normocephalic and atraumatic.     Mouth/Throat:     Mouth: Mucous membranes are moist.  Eyes:     General: No scleral icterus.       Right eye: No discharge.        Left eye: No discharge.     Extraocular Movements: Extraocular movements intact.     Conjunctiva/sclera: Conjunctivae normal.     Pupils: Pupils are equal, round, and reactive to light.  Cardiovascular:     Rate and Rhythm: Normal rate and regular rhythm.  Pulmonary:     Effort: Pulmonary effort is normal. No respiratory distress.     Breath sounds: Normal breath sounds. No wheezing, rhonchi or rales.  Musculoskeletal:     Cervical back: Neck supple. No tenderness.     Right lower leg: No edema.     Left lower leg: No edema.  Lymphadenopathy:     Cervical: No cervical adenopathy.  Skin:    General: Skin is warm and dry.     Coloration: Skin is not jaundiced or pale.     Findings: No erythema or rash.  Neurological:     Mental Status: She is alert and oriented to person, place, and time. Mental status is at baseline.      Motor: No weakness.     Gait: Gait normal.  Psychiatric:        Mood and Affect: Mood normal.        Behavior: Behavior normal.        Thought Content: Thought content normal.        Judgment: Judgment normal.     No results found for this or any previous visit (from the past 24 hour(s)).   ASSESSMENT AND PLAN: Melissa [name] is a 87 y.o. female present for  Controlled type 2 diabetes mellitus (HCC)/Type 2 DM mild nonproliferative retinopathy, macular edema, uncontrol (HCC) stable Continue metformin 1500 mg total a day- may divide. PNA series: Completed  2019 Flu shot: Completed 2022 Urine microal: collected today Foot exam: completed by podiatry 01/2021 (and 12/01/21) Eye exam: Dr. Katy Fitch, routine eye exams with macular degeneration.04/13/2020> due this year for retina specialist (yearly dm exam?) A1c: 6.5>>7.3 >>6.3>>6.4> 6.8> 6.5 > 6.4 > collected today   Hyperlipidemia associated (HCC)/Peripheral arterial disease (HCC)/Palpitations/IDA/CKD 3 - Continue to  avoid caffeine, stimulants-her vice is excess chocolate. - continue metoprolol 25 mg QD> prescribed by cardio - continue  atorvastatin 40 mg daily> prescribed by cardio - continue  baby aspirin daily. - avoid NSAIDS; renal dose.   Anxiety/insomnia Stable Continue lexapro 5 mg QD-tolerating She discontinued  Trazodone 25-50 mg nightly (felt made her shaky)  Gastroesophageal reflux disease without esophagitis Stable Continue Protonix -Encouraged her to try Biotene 2 times a day.    Fecal smearing/hemorrhoids: Evaluated by Dr. Earlean Shawl  Chronic thoracic back pain and new left jaw discomfort: Stable Continue gabapentin nightly  Continue Zanaflex prn  Return in about 5-6 months      Orders Placed This Encounter  Procedures   CBC   Comp Met (CMET)   TSH   Lipid panel   Hemoglobin A1c   Urine Microalbumin w/creat. ratio    Meds ordered this encounter  Medications   escitalopram (LEXAPRO) 5 MG tablet     Sig: Take 1 tablet (5 mg total) by mouth daily.    Dispense:  90 tablet    Refill:  1   gabapentin (NEURONTIN) 300 MG capsule    Sig: Take 1 capsule (300 mg total) by mouth at bedtime.    Dispense:  90 capsule    Refill:  1   metFORMIN (GLUCOPHAGE) 500 MG tablet    Sig: 500 mg (one tablet) in the morning and 1,000 mg (2 tablets) at night    Dispense:  270 tablet    Refill:  1   pantoprazole (PROTONIX) 40 MG tablet    Sig: Take 1 tablet (40 mg total) by mouth daily.    Dispense:  90 tablet    Refill:  3   tiZANidine (ZANAFLEX) 4 MG tablet    Sig: TAKE ONE TABLET BY MOUTH AT BEDTIME FOR MUSCLE SPASM PRN    Dispense:  90 tablet    Refill:  1    Hold script until pt request   fluocinonide cream (LIDEX) 0.05 %    Sig: Apply 1 application  topically 2 (two) times daily.    Dispense:  30 g    Refill:  [place], DO 12/01/2021

## 2021-12-01 NOTE — Patient Instructions (Addendum)
No follow-ups on file.        Great to see you today.  I have refilled the medication(s) we provide.   If labs were collected, we will inform you of lab results once received either by echart message or telephone call.   - echart message- for normal results that have been seen by the patient already.   - telephone call: abnormal results or if patient has not viewed results in their echart.   ___________________________________  Cetrizine/zyrtec ( 5 mg)  before bed for allergies. Cerave healing ointment for skin after showers  Steroid cream called in for you (in a tube) to use twice a day on the skin/elbow area until healed.

## 2022-01-04 DIAGNOSIS — M79644 Pain in right finger(s): Secondary | ICD-10-CM | POA: Diagnosis not present

## 2022-01-10 ENCOUNTER — Ambulatory Visit: Payer: Medicare PPO | Admitting: Family Medicine

## 2022-01-10 ENCOUNTER — Encounter: Payer: Self-pay | Admitting: Family Medicine

## 2022-01-10 VITALS — BP 109/57 | HR 100 | Temp 98.7°F | Ht 63.0 in | Wt 126.0 lb

## 2022-01-10 DIAGNOSIS — K5792 Diverticulitis of intestine, part unspecified, without perforation or abscess without bleeding: Secondary | ICD-10-CM

## 2022-01-10 DIAGNOSIS — R1032 Left lower quadrant pain: Secondary | ICD-10-CM | POA: Diagnosis not present

## 2022-01-10 MED ORDER — METRONIDAZOLE 500 MG PO TABS: 500.0000 mg | ORAL_TABLET | Freq: Three times a day (TID) | ORAL | 0 refills | Status: AC

## 2022-01-10 MED ORDER — LEVOFLOXACIN 500 MG PO TABS: 500.0000 mg | ORAL_TABLET | Freq: Every day | ORAL | 0 refills | Status: DC

## 2022-01-10 NOTE — Progress Notes (Signed)
Melissa [name] , Sep 05, 1934, 87 y.o., female MRN: [id number] Patient Care Team    Relationship Specialty Notifications Start End  [name], DO PCP - General Family Medicine  07/03/18   Zadie Rhine Clent Demark, MD Consulting Physician Ophthalmology  07/03/18   Emily Filbert, MD Consulting Physician Obstetrics and Gynecology  07/04/18   Troy Sine, MD Consulting Physician Cardiology  07/04/18   Nunzio Cobbs, MD Consulting Physician Obstetrics and Gynecology  02/18/20   Pllc, [facility]    12/01/21     Chief Complaint  Patient presents with   Abdominal Pain    Pt c/o lower abd pain x 2 days; pt stated she has been eating seeds last month     Subjective: Pt presents for an OV with complaints of lower abd pain last 2 days.  Associated symptoms include abd cramping.  Pt has a known h/o moderate- severe large mouth diverticulosis of sigmoid colon.  She reports day of onset she woke in a sweat, since she has not had fevers or chills.   She has been eating strawberries and cucumbers.      12/01/2021   10:07 AM 09/15/2021    9:37 AM 06/16/2021   10:42 AM 12/30/2020   10:10 AM 08/26/2020   12:51 PM  Depression screen PHQ 2/9  Decreased Interest 0 0 0 0 0  Down, Depressed, Hopeless 1 0 0 0 0  PHQ - 2 Score 1 0 0 0 0  Altered sleeping 3      Tired, decreased energy 2      Change in appetite 0      Feeling bad or failure about yourself  0      Trouble concentrating 0      Moving slowly or fidgety/restless 0      Suicidal thoughts 0      PHQ-9 Score 6        No Known Allergies Social History   Social History Narrative   Marital status/children/pets: Widowed.   Education/employment: 12th grade education.  Retired.   Safety:      -smoke alarm in the home:Yes     - wears seatbelt: Yes     - Feels safe in their relationships: Yes   Past Medical History:  Diagnosis Date   Burning tongue syndrome 11/11/2015   Colon polyps    Cystocele with  prolapse 2019   Dr. Hulan Fray- Monitor for now.    Detached retina, right    Diabetes mellitus    Diaphragmatic hernia    GERD (gastroesophageal reflux disease)    Hyperlipidemia    Insomnia    Macular degeneration    Past Surgical History:  Procedure Laterality Date   ABDOMINAL HYSTERECTOMY     BREAST CYST ASPIRATION  11/03/2020   FOOT SURGERY     OOPHORECTOMY     Rt.ovary removed with Hyst   RETINAL DETACHMENT SURGERY     TONSILLECTOMY     Family History  Problem Relation Age of Onset   Heart disease Other    Breast cancer Other    Breast cancer Mother        metastatic--to bone   CAD Father    Cancer Maternal Grandmother        unknown   Cancer Paternal Grandmother        colon   Allergies as of 01/10/2022   No Known Allergies      Medication  List        Accurate as of January 10, 2022  3:40 PM. If you have any questions, ask your nurse or doctor.          aspirin 81 MG tablet Take 81 mg by mouth daily.   atorvastatin 40 MG tablet Commonly known as: LIPITOR Take 1 tablet (40 mg total) by mouth daily.   cetirizine 5 MG tablet Commonly known as: ZYRTEC Take 1 tablet (5 mg total) by mouth at bedtime.   escitalopram 5 MG tablet Commonly known as: Lexapro Take 1 tablet (5 mg total) by mouth daily.   fluocinonide cream 0.05 % Commonly known as: LIDEX Apply 1 application  topically 2 (two) times daily.   gabapentin 300 MG capsule Commonly known as: NEURONTIN Take 1 capsule (300 mg total) by mouth at bedtime.   levofloxacin 500 MG tablet Commonly known as: Levaquin Take 1 tablet (500 mg total) by mouth daily. Started by: Howard Pouch, DO   metFORMIN 500 MG tablet Commonly known as: GLUCOPHAGE 500 mg (one tablet) in the morning and 1,000 mg (2 tablets) at night   metoprolol succinate 25 MG 24 hr tablet Commonly known as: TOPROL-XL Take 1 tablet (25 mg total) by mouth daily. KEEP UPCOMING APPOINTMENT FOR FUTURE REFILLS.   metroNIDAZOLE 500 MG  tablet Commonly known as: FLAGYL Take 1 tablet (500 mg total) by mouth 3 (three) times daily for 14 days. Started by: Howard Pouch, DO   multivitamin tablet Take 1 tablet by mouth daily.   Oyster Shell Calcium/D 250-125 MG-UNIT Tabs Take by mouth.   pantoprazole 40 MG tablet Commonly known as: PROTONIX Take 1 tablet (40 mg total) by mouth daily.   tiZANidine 4 MG tablet Commonly known as: ZANAFLEX TAKE ONE TABLET BY MOUTH AT BEDTIME FOR MUSCLE SPASM PRN        All past medical history, surgical history, allergies, family history, immunizations andmedications were updated in the EMR today and reviewed under the history and medication portions of their EMR.     Review of Systems  Constitutional:  Negative for chills, fever and weight loss.  Gastrointestinal:  Positive for abdominal pain. Negative for blood in stool, constipation, diarrhea, melena, nausea and vomiting.  Genitourinary:  Negative for dysuria, flank pain, frequency and urgency.   Negative, with the exception of above mentioned in HPI  Objective:  BP (!) 109/57   Pulse 100   Temp 98.7 F (37.1 C) (Oral)   Ht 5' 3"  (1.6 m)   Wt 126 lb (57.2 kg)   SpO2 95%   BMI 22.32 kg/m  Body mass index is 22.32 kg/m. Physical Exam Vitals and nursing note reviewed.  Constitutional:      General: She is not in acute distress.    Appearance: Normal appearance. She is not ill-appearing, toxic-appearing or diaphoretic.  HENT:     Head: Normocephalic and atraumatic.     Mouth/Throat:     Mouth: Mucous membranes are moist.  Eyes:     General: No scleral icterus.       Right eye: No discharge.        Left eye: No discharge.     Extraocular Movements: Extraocular movements intact.     Conjunctiva/sclera: Conjunctivae normal.     Pupils: Pupils are equal, round, and reactive to light.  Cardiovascular:     Rate and Rhythm: Normal rate and regular rhythm.  Pulmonary:     Effort: Pulmonary effort is normal.     Breath  sounds: Normal breath sounds.  Abdominal:     General: Abdomen is flat. Bowel sounds are increased. There is distension.     Palpations: Abdomen is soft. There is no mass.     Tenderness: There is abdominal tenderness in the left lower quadrant. There is no guarding or rebound.     Comments: Mild TTP LLQ.   Musculoskeletal:     Cervical back: Neck supple. No tenderness.  Lymphadenopathy:     Cervical: No cervical adenopathy.  Skin:    General: Skin is warm and dry.     Coloration: Skin is not jaundiced or pale.     Findings: No erythema or rash.  Neurological:     Mental Status: She is alert and oriented to person, place, and time. Mental status is at baseline.     Motor: No weakness.     Gait: Gait normal.  Psychiatric:        Mood and Affect: Mood normal.        Behavior: Behavior normal.        Thought Content: Thought content normal.        Judgment: Judgment normal.      No results found. No results found. No results found for this or any previous visit (from the past 24 hour(s)).  Assessment/Plan: Melissa [name] is a 87 y.o. female present for OV for  Left lower quadrant abdominal pain/ Diverticulitis Hydrate Soft/liquid/bland diet as tolerated next 24-48 hours.  Cipro/flagyl x14 days - CBC w/Diff - C-reactive protein - Comp Met (CMET) - consider ct abd if labs indicate need or sx are worsening.  F/u 2 weeks   Reviewed expectations re: course of current medical issues. Discussed self-management of symptoms. Outlined signs and symptoms indicating need for more acute intervention. Patient verbalized understanding and all questions were answered. Patient received an After-Visit Summary.    Orders Placed This Encounter  Procedures   CBC w/Diff   C-reactive protein   Comp Met (CMET)   Meds ordered this encounter  Medications   levofloxacin (LEVAQUIN) 500 MG tablet    Sig: Take 1 tablet (500 mg total) by mouth daily.    Dispense:  14 tablet    Refill:  0    metroNIDAZOLE (FLAGYL) 500 MG tablet    Sig: Take 1 tablet (500 mg total) by mouth 3 (three) times daily for 14 days.    Dispense:  42 tablet    Refill:  0   Referral Orders  No referral(s) requested today     Note is dictated utilizing voice recognition software. Although note has been proof read prior to signing, occasional typographical errors still can be missed. If any questions arise, please do not hesitate to call for verification.   electronically signed by:  Howard Pouch, DO  [place]

## 2022-01-10 NOTE — Patient Instructions (Signed)
Start the antibiotics today.  Flagyl is every 8 hours with food and levofloxacin once a day. Both are for 14 days each.   Follow up in 2 weeks, sooner if needed.  We will call you with lab results.  Hydrate well.  Eat Soft to liquid diet next couple days. Nothing spicy either.      Diverticulitis  Diverticulitis is when small pouches in your colon (large intestine) get infected or swollen. This causes pain in the belly (abdomen) and watery poop (diarrhea). These pouches are called diverticula. The pouches form in people who have a condition called diverticulosis. What are the causes? This condition may be caused by poop (stool) that gets trapped in the pouches in your colon. The poop lets germs (bacteria) grow in the pouches. This causes the infection. What increases the risk? You are more likely to get this condition if you have small pouches in your colon. The risk is higher if: You are overweight or very overweight (obese). You do not exercise enough. You drink alcohol. You smoke or use products with tobacco in them. You eat a diet that has a lot of red meat such as beef, pork, or lamb. You eat a diet that does not have enough fiber in it. You are older than 85 years of age. What are the signs or symptoms? Pain in the belly. Pain is often on the left side, but it may be in other areas. Fever and feeling cold. Feeling like you may vomit. Vomiting. Having cramps. Feeling full. Changes to how often you poop. Blood in your poop. How is this treated? Most cases are treated at home by: Taking over-the-counter pain medicines. Following a clear liquid diet. Taking antibiotic medicines. Resting. Very bad cases may need to be treated at a hospital. This may include: Not eating or drinking. Taking prescription pain medicine. Getting antibiotic medicines through an IV tube. Getting fluid and food through an IV tube. Having surgery. When you are feeling better, your doctor may  tell you to have a test to check your colon (colonoscopy). Follow these instructions at home: Medicines Take over-the-counter and prescription medicines only as told by your doctor. These include: Antibiotics. Pain medicines. Fiber pills. Probiotics. Stool softeners. If you were prescribed an antibiotic medicine, take it as told by your doctor. Do not stop taking the antibiotic even if you start to feel better. Ask your doctor if the medicine prescribed to you requires you to avoid driving or using machinery. Eating and drinking  Follow a diet as told by your doctor. When you feel better, your doctor may tell you to change your diet. You may need to eat a lot of fiber. Fiber makes it easier to poop (have a bowel movement). Foods with fiber include: Berries. Beans. Lentils. Green vegetables. Avoid eating red meat. General instructions Do not use any products that contain nicotine or tobacco, such as cigarettes, e-cigarettes, and chewing tobacco. If you need help quitting, ask your doctor. Exercise 3 or more times a week. Try to get 30 minutes each time. Exercise enough to sweat and make your heart beat faster. Keep all follow-up visits as told by your doctor. This is important. Contact a doctor if: Your pain does not get better. You are not pooping like normal. Get help right away if: Your pain gets worse. Your symptoms do not get better. Your symptoms get worse very fast. You have a fever. You vomit more than one time. You have poop that is: Bloody. Black.  [place]. Summary This condition happens when small pouches in your colon get infected or swollen. Take medicines only as told by your doctor. Follow a diet as told by your doctor. Keep all follow-up visits as told by your doctor. This is important. This information is not intended to replace advice given to you by your health care provider. Make sure you discuss any questions you have with your health care  provider. Document Revised: 03/18/2019 Document Reviewed: 03/18/2019 Elsevier Patient Education  2023 [employer].

## 2022-01-11 ENCOUNTER — Telehealth: Payer: Self-pay | Admitting: Family Medicine

## 2022-01-11 DIAGNOSIS — R1032 Left lower quadrant pain: Secondary | ICD-10-CM

## 2022-01-11 DIAGNOSIS — K5792 Diverticulitis of intestine, part unspecified, without perforation or abscess without bleeding: Secondary | ICD-10-CM

## 2022-01-11 DIAGNOSIS — R7982 Elevated C-reactive protein (CRP): Secondary | ICD-10-CM

## 2022-01-11 NOTE — Addendum Note (Signed)
Addended by: Maxie Barb on: 01/11/2022 03:51 PM   Modules accepted: Orders

## 2022-01-11 NOTE — Telephone Encounter (Signed)
Please call Melissa [name] inflammatory marker is rather significantly elevated and I am still waiting on [name] CBC results.  I have placed the order for CT abdomen ASAP.  She should be receiving a call to have this completed at [facility]. If [name] abdominal pain is worsening, she should be encouraged to be seen emergently in the ED instead of waiting for the scheduled CT abdomen.   Is she feeling any better since starting the antibiotic?

## 2022-01-11 NOTE — Telephone Encounter (Signed)
Spoke with pt regarding labs and instructions. Pt states she feeling better with abx. Pt wants to change CT to [place].

## 2022-01-12 NOTE — Telephone Encounter (Signed)
Please check on labs

## 2022-01-12 NOTE — Addendum Note (Signed)
Addended by: Maxie Barb on: 01/12/2022 12:09 PM   Modules accepted: Orders

## 2022-01-13 NOTE — Telephone Encounter (Signed)
LM for pt to return call to discuss. Pt needing to have labs redrawn

## 2022-01-14 ENCOUNTER — Ambulatory Visit (HOSPITAL_BASED_OUTPATIENT_CLINIC_OR_DEPARTMENT_OTHER): Admission: RE | Admit: 2022-01-14 | Payer: Medicare PPO | Source: Ambulatory Visit

## 2022-01-14 LAB — COMPREHENSIVE METABOLIC PANEL
AG Ratio: 1.5 (calc) (ref 1.0–2.5)
ALT: 7 U/L (ref 6–29)
AST: 11 U/L (ref 10–35)
Albumin: 4 g/dL (ref 3.6–5.1)
Alkaline phosphatase (APISO): 72 U/L (ref 37–153)
BUN: 18 mg/dL (ref 7–25)
CO2: 20 mmol/L (ref 20–32)
Calcium: 9.6 mg/dL (ref 8.6–10.4)
Chloride: 107 mmol/L (ref 98–110)
Creat: 0.9 mg/dL (ref 0.60–0.95)
Globulin: 2.7 g/dL (calc) (ref 1.9–3.7)
Glucose, Bld: 87 mg/dL (ref 65–99)
Potassium: 4.7 mmol/L (ref 3.5–5.3)
Sodium: 141 mmol/L (ref 135–146)
Total Bilirubin: 0.5 mg/dL (ref 0.2–1.2)
Total Protein: 6.7 g/dL (ref 6.1–8.1)

## 2022-01-14 LAB — C-REACTIVE PROTEIN: CRP: 59 mg/L — ABNORMAL HIGH (ref <8.0)

## 2022-01-14 LAB — CBC WITH DIFFERENTIAL/PLATELET

## 2022-01-14 NOTE — Telephone Encounter (Signed)
Pt will not have CT done because insurance is not covering it

## 2022-01-18 ENCOUNTER — Other Ambulatory Visit (INDEPENDENT_AMBULATORY_CARE_PROVIDER_SITE_OTHER): Payer: Medicare PPO

## 2022-01-18 DIAGNOSIS — R1032 Left lower quadrant pain: Secondary | ICD-10-CM

## 2022-01-18 LAB — CBC WITH DIFFERENTIAL/PLATELET
Basophils Absolute: 0.1 10*3/uL (ref 0.0–0.1)
Basophils Relative: 1.1 % (ref 0.0–3.0)
Eosinophils Absolute: 0.2 10*3/uL (ref 0.0–0.7)
Eosinophils Relative: 4.2 % (ref 0.0–5.0)
HCT: 37.9 % (ref 36.0–46.0)
Hemoglobin: 12.4 g/dL (ref 12.0–15.0)
Lymphocytes Relative: 20.5 % (ref 12.0–46.0)
Lymphs Abs: 1 10*3/uL (ref 0.7–4.0)
MCHC: 32.7 g/dL (ref 30.0–36.0)
MCV: 93.1 fl (ref 78.0–100.0)
Monocytes Absolute: 0.5 10*3/uL (ref 0.1–1.0)
Monocytes Relative: 9.9 % (ref 3.0–12.0)
Neutro Abs: 3 10*3/uL (ref 1.4–7.7)
Neutrophils Relative %: 64.3 % (ref 43.0–77.0)
Platelets: 276 10*3/uL (ref 150.0–400.0)
RBC: 4.07 Mil/uL (ref 3.87–5.11)
RDW: 15 % (ref 11.5–15.5)
WBC: 4.6 10*3/uL (ref 4.0–10.5)

## 2022-01-25 ENCOUNTER — Ambulatory Visit (INDEPENDENT_AMBULATORY_CARE_PROVIDER_SITE_OTHER): Payer: Medicare PPO | Admitting: Family Medicine

## 2022-01-25 ENCOUNTER — Encounter: Payer: Self-pay | Admitting: Family Medicine

## 2022-01-25 VITALS — BP 115/68 | HR 82 | Temp 98.7°F | Ht 63.0 in | Wt 127.0 lb

## 2022-01-25 DIAGNOSIS — K5792 Diverticulitis of intestine, part unspecified, without perforation or abscess without bleeding: Secondary | ICD-10-CM

## 2022-01-25 DIAGNOSIS — R1032 Left lower quadrant pain: Secondary | ICD-10-CM

## 2022-01-25 NOTE — Progress Notes (Unsigned)
Melissa [name] , May 13, 1935, 87 y.o., female MRN: [id number] Patient Care Team    Relationship Specialty Notifications Start End  [name], DO PCP - General Family Medicine  07/03/18   Zadie Rhine Clent Demark, MD Consulting Physician Ophthalmology  07/03/18   Emily Filbert, MD Consulting Physician Obstetrics and Gynecology  07/04/18   Troy Sine, MD Consulting Physician Cardiology  07/04/18   Nunzio Cobbs, MD Consulting Physician Obstetrics and Gynecology  02/18/20   Pllc, [facility]    12/01/21     Chief Complaint  Patient presents with   Diverticulitis    F/u; pt states sx are improving      Subjective: Pt presents for an OV with complaints of lower abd pain last 2 days.  Associated symptoms include abd cramping.  Pt has a known h/o moderate- severe large mouth diverticulosis of sigmoid colon.  She reports day of onset she woke in a sweat, since she has not had fevers or chills.   She has been eating strawberries and cucumbers.      12/01/2021   10:07 AM 09/15/2021    9:37 AM 06/16/2021   10:42 AM 12/30/2020   10:10 AM 08/26/2020   12:51 PM  Depression screen PHQ 2/9  Decreased Interest 0 0 0 0 0  Down, Depressed, Hopeless 1 0 0 0 0  PHQ - 2 Score 1 0 0 0 0  Altered sleeping 3      Tired, decreased energy 2      Change in appetite 0      Feeling bad or failure about yourself  0      Trouble concentrating 0      Moving slowly or fidgety/restless 0      Suicidal thoughts 0      PHQ-9 Score 6        No Known Allergies Social History   Social History Narrative   Marital status/children/pets: Widowed.   Education/employment: 12th grade education.  Retired.   Safety:      -smoke alarm in the home:Yes     - wears seatbelt: Yes     - Feels safe in their relationships: Yes   Past Medical History:  Diagnosis Date   Burning tongue syndrome 11/11/2015   Colon polyps    Cystocele with prolapse 2019   Dr. Hulan Fray- Monitor for now.     Detached retina, right    Diabetes mellitus    Diaphragmatic hernia    GERD (gastroesophageal reflux disease)    Hyperlipidemia    Insomnia    Macular degeneration    Past Surgical History:  Procedure Laterality Date   ABDOMINAL HYSTERECTOMY     BREAST CYST ASPIRATION  11/03/2020   FOOT SURGERY     OOPHORECTOMY     Rt.ovary removed with Hyst   RETINAL DETACHMENT SURGERY     TONSILLECTOMY     Family History  Problem Relation Age of Onset   Heart disease Other    Breast cancer Other    Breast cancer Mother        metastatic--to bone   CAD Father    Cancer Maternal Grandmother        unknown   Cancer Paternal Grandmother        colon   Allergies as of 01/25/2022   No Known Allergies      Medication List        Accurate as of  January 25, 2022 10:44 AM. If you have any questions, ask your nurse or doctor.          aspirin 81 MG tablet Take 81 mg by mouth daily.   atorvastatin 40 MG tablet Commonly known as: LIPITOR Take 1 tablet (40 mg total) by mouth daily.   cetirizine 5 MG tablet Commonly known as: ZYRTEC Take 1 tablet (5 mg total) by mouth at bedtime.   escitalopram 5 MG tablet Commonly known as: Lexapro Take 1 tablet (5 mg total) by mouth daily.   fluocinonide cream 0.05 % Commonly known as: LIDEX Apply 1 application  topically 2 (two) times daily.   gabapentin 300 MG capsule Commonly known as: NEURONTIN Take 1 capsule (300 mg total) by mouth at bedtime.   levofloxacin 500 MG tablet Commonly known as: Levaquin Take 1 tablet (500 mg total) by mouth daily.   metFORMIN 500 MG tablet Commonly known as: GLUCOPHAGE 500 mg (one tablet) in the morning and 1,000 mg (2 tablets) at night   metoprolol succinate 25 MG 24 hr tablet Commonly known as: TOPROL-XL Take 1 tablet (25 mg total) by mouth daily. KEEP UPCOMING APPOINTMENT FOR FUTURE REFILLS.   multivitamin tablet Take 1 tablet by mouth daily.   Oyster Shell Calcium/D 250-125 MG-UNIT Tabs Take  by mouth.   pantoprazole 40 MG tablet Commonly known as: PROTONIX Take 1 tablet (40 mg total) by mouth daily.   tiZANidine 4 MG tablet Commonly known as: ZANAFLEX TAKE ONE TABLET BY MOUTH AT BEDTIME FOR MUSCLE SPASM PRN        All past medical history, surgical history, allergies, family history, immunizations andmedications were updated in the EMR today and reviewed under the history and medication portions of their EMR.     Review of Systems  Constitutional:  Negative for chills, fever and weight loss.  Gastrointestinal:  Positive for abdominal pain. Negative for blood in stool, constipation, diarrhea, melena, nausea and vomiting.  Genitourinary:  Negative for dysuria, flank pain, frequency and urgency.   Negative, with the exception of above mentioned in HPI  Objective:  BP 115/68   Pulse 82   Temp 98.7 F (37.1 C) (Oral)   Ht 5' 3"  (1.6 m)   Wt 127 lb (57.6 kg)   SpO2 95%   BMI 22.50 kg/m  Body mass index is 22.5 kg/m. Physical Exam Vitals and nursing note reviewed.  Constitutional:      General: She is not in acute distress.    Appearance: Normal appearance. She is not ill-appearing, toxic-appearing or diaphoretic.  HENT:     Head: Normocephalic and atraumatic.     Mouth/Throat:     Mouth: Mucous membranes are moist.  Eyes:     General: No scleral icterus.       Right eye: No discharge.        Left eye: No discharge.     Extraocular Movements: Extraocular movements intact.     Conjunctiva/sclera: Conjunctivae normal.     Pupils: Pupils are equal, round, and reactive to light.  Cardiovascular:     Rate and Rhythm: Normal rate and regular rhythm.  Pulmonary:     Effort: Pulmonary effort is normal.     Breath sounds: Normal breath sounds.  Abdominal:     General: Abdomen is flat. Bowel sounds are increased. There is distension.     Palpations: Abdomen is soft. There is no mass.     Tenderness: There is abdominal tenderness in the left lower quadrant. There  is no guarding or rebound.     Comments: Mild TTP LLQ.   Musculoskeletal:     Cervical back: Neck supple. No tenderness.  Lymphadenopathy:     Cervical: No cervical adenopathy.  Skin:    General: Skin is warm and dry.     Coloration: Skin is not jaundiced or pale.     Findings: No erythema or rash.  Neurological:     Mental Status: She is alert and oriented to person, place, and time. Mental status is at baseline.     Motor: No weakness.     Gait: Gait normal.  Psychiatric:        Mood and Affect: Mood normal.        Behavior: Behavior normal.        Thought Content: Thought content normal.        Judgment: Judgment normal.     No results found. No results found. No results found for this or any previous visit (from the past 24 hour(s)).  Assessment/Plan: Melissa [name] is a 87 y.o. female present for OV for  Left lower quadrant abdominal pain/ Diverticulitis Hydrate Soft/liquid/bland diet as tolerated next 24-48 hours.  Cipro/flagyl x14 days - CBC w/Diff - C-reactive protein - Comp Met (CMET) - consider ct abd if labs indicate need or sx are worsening.  F/u 2 weeks   Reviewed expectations re: course of current medical issues. Discussed self-management of symptoms. Outlined signs and symptoms indicating need for more acute intervention. Patient verbalized understanding and all questions were answered. Patient received an After-Visit Summary.    No orders of the defined types were placed in this encounter.  No orders of the defined types were placed in this encounter.  Referral Orders  No referral(s) requested today     Note is dictated utilizing voice recognition software. Although note has been proof read prior to signing, occasional typographical errors still can be missed. If any questions arise, please do not hesitate to call for verification.   electronically signed by:  Howard Pouch, DO  [place]

## 2022-01-25 NOTE — Patient Instructions (Signed)
Diverticulitis  Diverticulitis is when small pouches in your colon (large intestine) get infected or swollen. This causes pain in the belly (abdomen) and watery poop (diarrhea). These pouches are called diverticula. The pouches form in people who have a condition called diverticulosis. What are the causes? This condition may be caused by poop (stool) that gets trapped in the pouches in your colon. The poop lets germs (bacteria) grow in the pouches. This causes the infection. What increases the risk? You are more likely to get this condition if you have small pouches in your colon. The risk is higher if: You are overweight or very overweight (obese). You do not exercise enough. You drink alcohol. You smoke or use products with tobacco in them. You eat a diet that has a lot of red meat such as beef, pork, or lamb. You eat a diet that does not have enough fiber in it. You are older than 85 years of age. What are the signs or symptoms? Pain in the belly. Pain is often on the left side, but it may be in other areas. Fever and feeling cold. Feeling like you may vomit. Vomiting. Having cramps. Feeling full. Changes to how often you poop. Blood in your poop. How is this treated? Most cases are treated at home by: Taking over-the-counter pain medicines. Following a clear liquid diet. Taking antibiotic medicines. Resting. Very bad cases may need to be treated at a hospital. This may include: Not eating or drinking. Taking prescription pain medicine. Getting antibiotic medicines through an IV tube. Getting fluid and food through an IV tube. Having surgery. When you are feeling better, your doctor may tell you to have a test to check your colon (colonoscopy). Follow these instructions at home: Medicines Take over-the-counter and prescription medicines only as told by your doctor. These include: Antibiotics. Pain medicines. Fiber pills. Probiotics. Stool softeners. If you were  prescribed an antibiotic medicine, take it as told by your doctor. Do not stop taking the antibiotic even if you start to feel better. Ask your doctor if the medicine prescribed to you requires you to avoid driving or using machinery. Eating and drinking  Follow a diet as told by your doctor. When you feel better, your doctor may tell you to change your diet. You may need to eat a lot of fiber. Fiber makes it easier to poop (have a bowel movement). Foods with fiber include: Berries. Beans. Lentils. Green vegetables. Avoid eating red meat. General instructions Do not use any products that contain nicotine or tobacco, such as cigarettes, e-cigarettes, and chewing tobacco. If you need help quitting, ask your doctor. Exercise 3 or more times a week. Try to get 30 minutes each time. Exercise enough to sweat and make your heart beat faster. Keep all follow-up visits as told by your doctor. This is important. Contact a doctor if: Your pain does not get better. You are not pooping like normal. Get help right away if: Your pain gets worse. Your symptoms do not get better. Your symptoms get worse very fast. You have a fever. You vomit more than one time. You have poop that is: Bloody. Black. Tarry. Summary This condition happens when small pouches in your colon get infected or swollen. Take medicines only as told by your doctor. Follow a diet as told by your doctor. Keep all follow-up visits as told by your doctor. This is important. This information is not intended to replace advice given to you by your health care provider. Make sure   you discuss any questions you have with your health care provider. Document Revised: 03/18/2019 Document Reviewed: 03/18/2019 Elsevier Patient Education  2023 Elsevier Inc.  

## 2022-01-26 DIAGNOSIS — K5792 Diverticulitis of intestine, part unspecified, without perforation or abscess without bleeding: Secondary | ICD-10-CM

## 2022-01-26 HISTORY — DX: Diverticulitis of intestine, part unspecified, without perforation or abscess without bleeding: K57.92

## 2022-04-14 ENCOUNTER — Encounter (INDEPENDENT_AMBULATORY_CARE_PROVIDER_SITE_OTHER): Payer: Medicare PPO | Admitting: Ophthalmology

## 2022-05-17 ENCOUNTER — Ambulatory Visit: Payer: Medicare PPO | Admitting: Family Medicine

## 2022-05-17 ENCOUNTER — Encounter: Payer: Self-pay | Admitting: Family Medicine

## 2022-05-17 VITALS — BP 127/79 | HR 96 | Temp 98.2°F | Wt 124.6 lb

## 2022-05-17 DIAGNOSIS — E785 Hyperlipidemia, unspecified: Secondary | ICD-10-CM

## 2022-05-17 DIAGNOSIS — E1169 Type 2 diabetes mellitus with other specified complication: Secondary | ICD-10-CM | POA: Diagnosis not present

## 2022-05-17 LAB — POCT GLYCOSYLATED HEMOGLOBIN (HGB A1C)
HbA1c POC (<> result, manual entry): 6.4 % (ref 4.0–5.6)
HbA1c, POC (controlled diabetic range): 6.4 % (ref 0.0–7.0)
HbA1c, POC (prediabetic range): 6.4 % (ref 5.7–6.4)
Hemoglobin A1C: 6.4 % — AB (ref 4.0–5.6)

## 2022-05-17 MED ORDER — ESCITALOPRAM OXALATE 5 MG PO TABS: 5.0000 mg | ORAL_TABLET | Freq: Every day | ORAL | 1 refills | Status: DC

## 2022-05-17 MED ORDER — FLUOCINONIDE 0.05 % EX CREA: 1.0000 | TOPICAL_CREAM | Freq: Two times a day (BID) | CUTANEOUS | 2 refills | Status: DC

## 2022-05-17 MED ORDER — METFORMIN HCL 500 MG PO TABS: ORAL_TABLET | ORAL | 1 refills | Status: DC

## 2022-05-17 MED ORDER — GABAPENTIN 300 MG PO CAPS: 300.0000 mg | ORAL_CAPSULE | Freq: Every day | ORAL | 1 refills | Status: DC

## 2022-05-17 NOTE — Progress Notes (Signed)
Patient Care Team    Relationship Specialty Notifications Start End  Natalia Leatherwood, DO PCP - General Family Medicine  07/03/18   Luciana Axe Alford Highland, MD Consulting Physician Ophthalmology  07/03/18   Allie Bossier, MD Consulting Physician Obstetrics and Gynecology  07/04/18   Lennette Bihari, MD Consulting Physician Cardiology  07/04/18   Patton Salles, MD Consulting Physician Obstetrics and Gynecology  02/18/20   Ponciano Ort, [facility]    12/01/21     SUBJECTIVE Chief Complaint  Patient presents with   Diabetes    HPI: Melissa [name] is a 87 y.o. present for f/u [facility] Diabetes/macular degeneration/nonproliferative retinopathy:  Pt reports compliance  with metformin 500 mg in the morning and 1000 mg in the afternoon.Patient denies dizziness, hyperglycemic or hypoglycemic events. Patient denies numbness, tingling in the extremities or nonhealing wounds of feet.  She is prescribed gabapentin 300 mg nightly for back pain.    Palpitations/hyperlipidemia/peripheral artery disease/anxiety: She reports compliance with her metoprolol 25 mg daily, lexapro 5 mg qd. She is prescribed Lipitor 40 mg daily and takes a daily baby aspirin.  Echo Doppler study on October 03, 2017 showed an EF of 65 to 70%.  There was severe mitral annular calcification with very mild functional MS by valve area. Patient denies chest pain, shortness of breath, dizziness or lower extremity edema.   GERD: Patient reports Her GERD is well controlled.  She is compliant with Protonix 40 mg daily.  She has been on this for quite some time and takes it daily. Without medication her symptoms return.   Chronic back pain  Patient reports compliance with gabapentin has been extremely helpful. She only occ.will use the Zanaflex and but make sure not to take at the same time as her gabapentin.  She deniesany oversedation.   ROS: See pertinent positives and negatives per HPI.  Patient Active Problem List    Diagnosis Date Noted   Diverticulitis 01/26/2022   Callus 10/21/2020   Early stage nonexudative age-related macular degeneration of left eye 04/13/2020   Advanced nonexudative age-related macular degeneration of right eye with subfoveal involvement 04/01/2020   Degenerative retinal drusen of left eye 04/01/2020   Pseudophakia, both eyes 04/01/2020   Posterior vitreous detachment of left eye 04/01/2020   Anxiety 03/05/2019   Chronic bilateral thoracic back pain 08/03/2018   Iron deficiency anemia 07/04/2018   Palpitations 07/04/2018   Peripheral arterial disease (HCC) 07/11/2017   Varicose veins of both lower extremities 08/31/2016   Macular degeneration 05/05/2015   Hiatal hernia 01/28/2015   Mild nonproliferative retinopathy of both eyes with macular edema associated with type 2 diabetes mellitus (HCC) 10/02/2013   Hyperlipidemia associated with type 2 diabetes mellitus (HCC) 03/24/2009   GERD 03/24/2009    Social History   Tobacco Use   Smoking status: Never   Smokeless tobacco: Never  Substance Use Topics   Alcohol use: No    Current Outpatient Medications:    aspirin 81 MG tablet, Take 81 mg by mouth daily., Disp: , Rfl:    atorvastatin (LIPITOR) 40 MG tablet, Take 1 tablet (40 mg total) by mouth daily., Disp: 90 tablet, Rfl: 4   Calcium Carbonate-Vitamin D (OYSTER SHELL CALCIUM/D) 250-125 MG-UNIT TABS, Take by mouth., Disp: , Rfl:    metoprolol succinate (TOPROL-XL) 25 MG 24 hr tablet, Take 1 tablet (25 mg total) by mouth daily. KEEP UPCOMING APPOINTMENT FOR FUTURE REFILLS., Disp: 90 tablet, Rfl: 4   Multiple  Vitamin (MULTIVITAMIN) tablet, Take 1 tablet by mouth daily., Disp: , Rfl:    pantoprazole (PROTONIX) 40 MG tablet, Take 1 tablet (40 mg total) by mouth daily., Disp: 90 tablet, Rfl: 3   tiZANidine (ZANAFLEX) 4 MG tablet, TAKE ONE TABLET BY MOUTH AT BEDTIME FOR MUSCLE SPASM PRN, Disp: 90 tablet, Rfl: 1   escitalopram (LEXAPRO) 5 MG tablet, Take 1 tablet (5 mg total) by  mouth daily., Disp: 90 tablet, Rfl: 1   fluocinonide cream (LIDEX) 0.05 %, Apply 1 Application topically 2 (two) times daily., Disp: 30 g, Rfl: 2   gabapentin (NEURONTIN) 300 MG capsule, Take 1 capsule (300 mg total) by mouth at bedtime., Disp: 90 capsule, Rfl: 1   metFORMIN (GLUCOPHAGE) 500 MG tablet, 500 mg (one tablet) in the morning and 1,000 mg (2 tablets) at night, Disp: 270 tablet, Rfl: 1  No Known Allergies  OBJECTIVE: BP 127/79   Pulse 96   Temp 98.2 F (36.8 C)   Wt 124 lb 9.6 oz (56.5 kg)   SpO2 97%   BMI 22.07 kg/m  Physical Exam Vitals and nursing note reviewed.  Constitutional:      General: She is not in acute distress.    Appearance: Normal appearance. She is not ill-appearing, toxic-appearing or diaphoretic.  HENT:     Head: Normocephalic and atraumatic.     Mouth/Throat:     Mouth: Mucous membranes are moist.  Eyes:     General: No scleral icterus.       Right eye: No discharge.        Left eye: No discharge.     Extraocular Movements: Extraocular movements intact.     Conjunctiva/sclera: Conjunctivae normal.     Pupils: Pupils are equal, round, and reactive to light.  Cardiovascular:     Rate and Rhythm: Normal rate and regular rhythm.  Pulmonary:     Effort: Pulmonary effort is normal. No respiratory distress.     Breath sounds: Normal breath sounds. No wheezing, rhonchi or rales.  Musculoskeletal:     Cervical back: Neck supple. No tenderness.     Right lower leg: No edema.     Left lower leg: No edema.  Lymphadenopathy:     Cervical: No cervical adenopathy.  Skin:    General: Skin is warm and dry.     Coloration: Skin is not jaundiced or pale.     Findings: No erythema or rash.  Neurological:     Mental Status: She is alert and oriented to person, place, and time. Mental status is at baseline.     Motor: No weakness.     Gait: Gait normal.  Psychiatric:        Mood and Affect: Mood normal.        Behavior: Behavior normal.        Thought  Content: Thought content normal.        Judgment: Judgment normal.    Diabetic Foot Exam - Simple   Simple Foot Form Diabetic Foot exam was performed with the following findings: Yes 05/17/2022 10:12 AM  Visual Inspection No deformities, no ulcerations, no other skin breakdown bilaterally: Yes Sensation Testing Intact to touch and monofilament testing bilaterally: Yes Pulse Check Posterior Tibialis and Dorsalis pulse intact bilaterally: Yes Comments      Results for orders placed or performed in visit on 05/17/22 (from the past 24 hour(s))  POCT glycosylated hemoglobin (Hb A1C)     Status: Abnormal   Collection Time: 05/17/22 10:15 AM  Result Value Ref Range   Hemoglobin A1C 6.4 (A) 4.0 - 5.6 %   HbA1c POC (<> result, manual entry) 6.4 4.0 - 5.6 %   HbA1c, POC (prediabetic range) 6.4 5.7 - 6.4 %   HbA1c, POC (controlled diabetic range) 6.4 0.0 - 7.0 %     ASSESSMENT AND PLAN: Melissa [name] is a 87 y.o. female present for  Controlled type 2 diabetes mellitus (HCC)/Type 2 DM mild nonproliferative retinopathy, macular edema, uncontrol (HCC) Stable Continue  metformin 1500 mg total a day- may divide. PNA series: Completed 2019 Flu shot: Completed 2022 Urine microal: UTD 11/2021 Foot exam: completed  05/14/2022 Eye exam: Dr. Dione Booze, routine eye exams with macular degeneration.04/13/2020> due this year for retina specialist (yearly dm exam?) A1c: 6.5>>7.3 >>6.3>>6.4> 6.8> 6.5 > 6.4 >6.8 > 6.4 collected today   Hyperlipidemia associated (HCC)/Peripheral arterial disease (HCC)/Palpitations/IDA/CKD 3 - Continue to  avoid caffeine, stimulants-her vice is excess chocolate. - continue  metoprolol 25 mg QD> prescribed by cardio - continue  atorvastatin 40 mg daily> prescribed by cardio - continue  baby aspirin daily. - avoid NSAIDS; renal dose.   Anxiety/insomnia Stable Continue  lexapro 5 mg QD-tolerating She discontinued  Trazodone 25-50 mg nightly (felt made her  shaky)  Gastroesophageal reflux disease without esophagitis Stable  continue Protonix -Encouraged her to try Biotene 2 times a day.    Fecal smearing/hemorrhoids: Evaluated by Dr. Kinnie Scales  Chronic thoracic back pain and new left jaw discomfort: Stable  Continue gabapentin nightly  Continue  Zanaflex prn  Return in about 5-6 months      Orders Placed This Encounter  Procedures   POCT glycosylated hemoglobin (Hb A1C)    Meds ordered this encounter  Medications   escitalopram (LEXAPRO) 5 MG tablet    Sig: Take 1 tablet (5 mg total) by mouth daily.    Dispense:  90 tablet    Refill:  1   gabapentin (NEURONTIN) 300 MG capsule    Sig: Take 1 capsule (300 mg total) by mouth at bedtime.    Dispense:  90 capsule    Refill:  1   metFORMIN (GLUCOPHAGE) 500 MG tablet    Sig: 500 mg (one tablet) in the morning and 1,000 mg (2 tablets) at night    Dispense:  270 tablet    Refill:  1   fluocinonide cream (LIDEX) 0.05 %    Sig: Apply 1 Application topically 2 (two) times daily.    Dispense:  30 g    Refill:  2      Donna Silverman Claiborne Billings, DO 05/17/2022

## 2022-05-17 NOTE — Patient Instructions (Addendum)
Return in about 24 weeks (around 11/01/2022) for Routine chronic condition follow-up with fasting labs.        Great to see you today.  I have refilled the medication(s) we provide.   If labs were collected, we will inform you of lab results once received either by echart message or telephone call.   - echart message- for normal results that have been seen by the patient already.   - telephone call: abnormal results or if patient has not viewed results in their echart.

## 2022-06-20 DIAGNOSIS — I2699 Other pulmonary embolism without acute cor pulmonale: Secondary | ICD-10-CM

## 2022-06-20 HISTORY — DX: Other pulmonary embolism without acute cor pulmonale: I26.99

## 2022-06-29 ENCOUNTER — Emergency Department (HOSPITAL_COMMUNITY): Payer: Medicare PPO

## 2022-06-29 ENCOUNTER — Emergency Department (HOSPITAL_COMMUNITY)
Admission: EM | Admit: 2022-06-29 | Discharge: 2022-06-29 | Disposition: A | Discharge status: Home / Self Care | Payer: Medicare PPO | Attending: Emergency Medicine | Admitting: Emergency Medicine

## 2022-06-29 ENCOUNTER — Encounter: Payer: Self-pay | Admitting: Family Medicine

## 2022-06-29 ENCOUNTER — Ambulatory Visit: Payer: Medicare PPO | Admitting: Family Medicine

## 2022-06-29 VITALS — BP 111/63 | HR 112 | Temp 99.0°F | Ht 63.0 in | Wt 121.0 lb

## 2022-06-29 DIAGNOSIS — R000 Tachycardia, unspecified: Secondary | ICD-10-CM

## 2022-06-29 DIAGNOSIS — R0682 Tachypnea, not elsewhere classified: Secondary | ICD-10-CM

## 2022-06-29 DIAGNOSIS — R1013 Epigastric pain: Secondary | ICD-10-CM | POA: Diagnosis not present

## 2022-06-29 DIAGNOSIS — R0902 Hypoxemia: Secondary | ICD-10-CM | POA: Diagnosis not present

## 2022-06-29 DIAGNOSIS — K449 Diaphragmatic hernia without obstruction or gangrene: Secondary | ICD-10-CM | POA: Insufficient documentation

## 2022-06-29 DIAGNOSIS — U071 COVID-19: Secondary | ICD-10-CM

## 2022-06-29 DIAGNOSIS — R051 Acute cough: Secondary | ICD-10-CM | POA: Diagnosis not present

## 2022-06-29 DIAGNOSIS — R0781 Pleurodynia: Secondary | ICD-10-CM | POA: Diagnosis not present

## 2022-06-29 DIAGNOSIS — R0602 Shortness of breath: Secondary | ICD-10-CM | POA: Diagnosis not present

## 2022-06-29 DIAGNOSIS — R1010 Upper abdominal pain, unspecified: Secondary | ICD-10-CM

## 2022-06-29 LAB — CBC WITH DIFFERENTIAL/PLATELET
Abs Immature Granulocytes: 0.12 10*3/uL — ABNORMAL HIGH (ref 0.00–0.07)
Basophils Absolute: 0.1 10*3/uL (ref 0.0–0.1)
Basophils Relative: 1 %
Eosinophils Absolute: 0 10*3/uL (ref 0.0–0.5)
Eosinophils Relative: 0 %
HCT: 42.5 % (ref 36.0–46.0)
Hemoglobin: 13.8 g/dL (ref 12.0–15.0)
Immature Granulocytes: 2 %
Lymphocytes Relative: 5 %
Lymphs Abs: 0.4 10*3/uL — ABNORMAL LOW (ref 0.7–4.0)
MCH: 30.6 pg (ref 26.0–34.0)
MCHC: 32.5 g/dL (ref 30.0–36.0)
MCV: 94.2 fL (ref 80.0–100.0)
Monocytes Absolute: 0.5 10*3/uL (ref 0.1–1.0)
Monocytes Relative: 7 %
Neutro Abs: 6.2 10*3/uL (ref 1.7–7.7)
Neutrophils Relative %: 85 %
Platelets: 261 10*3/uL (ref 150–400)
RBC: 4.51 MIL/uL (ref 3.87–5.11)
RDW: 13.1 % (ref 11.5–15.5)
WBC: 7.3 10*3/uL (ref 4.0–10.5)
nRBC: 0 % (ref 0.0–0.2)

## 2022-06-29 LAB — COMPREHENSIVE METABOLIC PANEL
ALT: 12 U/L (ref 0–44)
AST: 17 U/L (ref 15–41)
Albumin: 4 g/dL (ref 3.5–5.0)
Alkaline Phosphatase: 76 U/L (ref 38–126)
Anion gap: 10 (ref 5–15)
BUN: 16 mg/dL (ref 8–23)
CO2: 24 mmol/L (ref 22–32)
Calcium: 9.3 mg/dL (ref 8.9–10.3)
Chloride: 103 mmol/L (ref 98–111)
Creatinine, Ser: 0.89 mg/dL (ref 0.44–1.00)
GFR, Estimated: >60 mL/min (ref >60)
Glucose, Bld: 136 mg/dL — ABNORMAL HIGH (ref 70–99)
Potassium: 4.2 mmol/L (ref 3.5–5.1)
Sodium: 137 mmol/L (ref 135–145)
Total Bilirubin: 0.6 mg/dL (ref 0.3–1.2)
Total Protein: 7.8 g/dL (ref 6.5–8.1)

## 2022-06-29 LAB — POC COVID19 BINAXNOW: SARS Coronavirus 2 Ag: POSITIVE — AB

## 2022-06-29 LAB — BLOOD GAS, VENOUS
Acid-Base Excess: 2 mmol/L (ref 0.0–2.0)
Bicarbonate: 27.3 mmol/L (ref 20.0–28.0)
O2 Saturation: 43 %
Patient temperature: 37
pCO2, Ven: 44 mmHg (ref 44–60)
pH, Ven: 7.4 (ref 7.25–7.43)
pO2, Ven: <31 mmHg — CL (ref 32–45)

## 2022-06-29 LAB — LIPASE, BLOOD: Lipase: 34 U/L (ref 11–51)

## 2022-06-29 MED ORDER — DEXAMETHASONE SODIUM PHOSPHATE 10 MG/ML IJ SOLN
10.0000 mg | Freq: Once | INTRAMUSCULAR | Status: AC
  Administered 2022-06-29: 10 mg via INTRAVENOUS
  Filled 2022-06-29: qty 1

## 2022-06-29 MED ORDER — ONDANSETRON HCL 4 MG/2ML IJ SOLN
4.0000 mg | Freq: Once | INTRAMUSCULAR | Status: AC
  Administered 2022-06-29: 4 mg via INTRAVENOUS
  Filled 2022-06-29: qty 2

## 2022-06-29 MED ORDER — ALBUTEROL SULFATE HFA 108 (90 BASE) MCG/ACT IN AERS
2.0000 | INHALATION_SPRAY | Freq: Once | RESPIRATORY_TRACT | Status: AC
  Administered 2022-06-29: 2 via RESPIRATORY_TRACT
  Filled 2022-06-29: qty 6.7

## 2022-06-29 MED ORDER — SODIUM CHLORIDE 0.9 % IV BOLUS
500.0000 mL | Freq: Once | INTRAVENOUS | Status: AC
  Administered 2022-06-29: 500 mL via INTRAVENOUS

## 2022-06-29 MED ORDER — ONDANSETRON 4 MG PO TBDP: ORAL_TABLET | ORAL | 0 refills | Status: DC

## 2022-06-29 MED ORDER — NIRMATRELVIR/RITONAVIR (PAXLOVID)TABLET: 3.0000 | ORAL_TABLET | Freq: Two times a day (BID) | ORAL | 0 refills | Status: AC

## 2022-06-29 NOTE — ED Provider Notes (Signed)
[facility] DEPT Provider Note   CSN: [id number] Arrival date & time: 06/29/22  1518     History  Chief Complaint  Patient presents with   Shortness of Breath    Melissa [name] is a 88 y.o. female history of diabetes here presenting with shortness of breath and cough and abdominal pain.  Patient has a history of hiatal hernia and had some vomiting today.  She also has some shortness of breath for the last 2 to 3 days.  She did go to church 4 days ago and was feeling fine at that time.  Patient has some productive cough as well.  She also had some vomiting.  She went to the clinic and tested positive for COVID and sent here for further evaluation.  The history is provided by the patient.       Home Medications Prior to Admission medications   Medication Sig Start Date End Date Taking? Authorizing Provider  aspirin 81 MG tablet Take 81 mg by mouth daily.    [provider]  atorvastatin (LIPITOR) 40 MG tablet Take 1 tablet (40 mg total) by mouth daily. 11/26/21   Lendon Colonel, NP  Calcium Carbonate-Vitamin D (OYSTER SHELL CALCIUM/D) 250-125 MG-UNIT TABS Take by mouth.    [provider]  escitalopram (LEXAPRO) 5 MG tablet Take 1 tablet (5 mg total) by mouth daily. 05/17/22   Kuneff, Renee A, DO  fluocinonide cream (LIDEX) 2.42 % Apply 1 Application topically 2 (two) times daily. 05/17/22   Kuneff, Renee A, DO  gabapentin (NEURONTIN) 300 MG capsule Take 1 capsule (300 mg total) by mouth at bedtime. 05/17/22   Kuneff, Renee A, DO  metFORMIN (GLUCOPHAGE) 500 MG tablet 500 mg (one tablet) in the morning and 1,000 mg (2 tablets) at night 05/17/22   Kuneff, Renee A, DO  metoprolol succinate (TOPROL-XL) 25 MG 24 hr tablet Take 1 tablet (25 mg total) by mouth daily. KEEP UPCOMING APPOINTMENT FOR FUTURE REFILLS. 11/26/21   Lendon Colonel, NP  Multiple Vitamin (MULTIVITAMIN) tablet Take 1 tablet by mouth daily.    [provider]   pantoprazole (PROTONIX) 40 MG tablet Take 1 tablet (40 mg total) by mouth daily. 12/01/21   Kuneff, Renee A, DO  tiZANidine (ZANAFLEX) 4 MG tablet TAKE ONE TABLET BY MOUTH AT BEDTIME FOR MUSCLE SPASM PRN 12/01/21   Raoul Pitch, Renee A, DO      Allergies    Patient has no known allergies.    Review of Systems   Review of Systems  Respiratory:  Positive for shortness of breath.   Gastrointestinal:  Positive for abdominal pain.  All other systems reviewed and are negative.   Physical Exam Updated Vital Signs BP 124/60   Pulse (!) 106   Resp 20   SpO2 96%  Physical Exam Vitals and nursing note reviewed.  Constitutional:      Comments: Chronically ill, dehydrated  HENT:     Head: Normocephalic.     Mouth/Throat:     Pharynx: Oropharynx is clear.  Eyes:     Extraocular Movements: Extraocular movements intact.     Pupils: Pupils are equal, round, and reactive to light.  Cardiovascular:     Rate and Rhythm: Normal rate and regular rhythm.  Pulmonary:     Comments: Diminished breath laterally, no obvious wheezing or crackles Musculoskeletal:        General: Normal range of motion.     Cervical back: Normal range of motion and  neck supple.  Skin:    General: Skin is warm.     Capillary Refill: Capillary refill takes less than 2 seconds.  Neurological:     General: No focal deficit present.  Psychiatric:        Mood and Affect: Mood normal.     ED Results / Procedures / Treatments   Labs (all labs ordered are listed, but only abnormal results are displayed) Labs Reviewed  CBC WITH DIFFERENTIAL/PLATELET  COMPREHENSIVE METABOLIC PANEL  BLOOD GAS, VENOUS    EKG None  Radiology DG Chest Port 1 View  Result Date: 06/29/2022 CLINICAL DATA:  Shortness of breath.  COVID infection 3 days ago. EXAM: PORTABLE CHEST 1 VIEW COMPARISON:  Radiographs 05/06/2013 and 09/02/2009. FINDINGS: 1604 hours. Lordotic positioning. A large hiatal hernia has enlarged from the previous study. The  heart size appears stable at the upper limits of normal for AP technique. There is aortic atherosclerosis. The lungs are clear. There is no pleural effusion or pneumothorax. No acute osseous findings are demonstrated. IMPRESSION: No evidence of acute cardiopulmonary process. Large hiatal hernia, enlarged from previous study. Aortic atherosclerosis. Electronically Signed   By: Richardean Sale M.D.   On: 06/29/2022 16:13    Procedures Procedures    Medications Ordered in ED Medications  sodium chloride 0.9 % bolus 500 mL (has no administration in time range)  dexamethasone (DECADRON) injection 10 mg (10 mg Intravenous Given 06/29/22 1628)  ondansetron (ZOFRAN) injection 4 mg (4 mg Intravenous Given 06/29/22 1628)  albuterol (VENTOLIN HFA) 108 (90 Base) MCG/ACT inhaler 2 puff (2 puffs Inhalation Given 06/29/22 1616)    ED Course/ Medical Decision Making/ A&P                           Medical Decision Making Melissa [name] is a 88 y.o. female here presenting with shortness of breath and epigastric pain.  Patient has a known hiatal hernia and got worsening pain after she coughed.  Patient has tested positive for COVID in the office.  I think her symptoms are likely from [place].  Patient was on 2 L nasal cannula by EMS but is been on room air since then.  Will get chest x-ray and CBC and CMP and VBG.  Will give albuterol and steroids.  8:22 PM Patient received Decadron and felt better.  Oxygen level remained above 90% when she ambulated.  Patient does have a large hiatal hernia that is contributing to her pain.  She felt better after Zofran.  At this point, patient stable for discharge.  Will discharge home with Paxlovid and albuterol as needed.   Amount and/or Complexity of Data Reviewed Labs: ordered. Radiology: ordered. ECG/medicine tests: ordered.  Risk Prescription drug management.    Final Clinical Impression(s) / ED Diagnoses Final diagnoses:  None    Rx / DC Orders ED Discharge  Orders     None         Drenda Freeze, MD 06/29/22 2023

## 2022-06-29 NOTE — Discharge Instructions (Addendum)
You have COVID-19 so please take Paxlovid as prescribed.  You were given steroids in the ER.  Please take use albuterol as needed for cough and congestion  Take Zofran as needed for nausea  Stay hydrated   See your doctor for follow-up in a week.  Return to ER if you have worse vomiting, shortness of breath, trouble breathing, fever

## 2022-06-29 NOTE — ED Triage Notes (Signed)
BIBA from pcp for sob.  Covid + 3 days ago.  Non-productive cough per patient.

## 2022-06-29 NOTE — ED Notes (Signed)
Ambulated pt. Oxygen maintained while being walked. Oxygen did not go below 90

## 2022-06-29 NOTE — ED Notes (Signed)
Dr Darl Householder notified verbally of critical lab value  pO2 less than 31

## 2022-06-29 NOTE — ED Notes (Signed)
RN assumed care of pt, she is alert and oriented at this time.  RN reviewed discharge/follow up instructions w/ pt including prescriptions.  Pt called her son and RN also reviewed discharge/follow up instructions including prescriptions with him. He will be here is 30 minutes.

## 2022-06-29 NOTE — Progress Notes (Signed)
Melissa [name] , [birth year], 88 y.o.,, female MRN: [id number] Patient Care Team    Relationship Specialty Notifications Start End  [name], DO PCP - General Family Medicine  07/03/18   Rankin, Clent Demark, MD Consulting Physician Ophthalmology  07/03/18   Emily Filbert, MD Consulting Physician Obstetrics and Gynecology  07/04/18   Troy Sine, MD Consulting Physician Cardiology  07/04/18   Nunzio Cobbs, MD Consulting Physician Obstetrics and Gynecology  02/18/20   Pllc, [place]    12/01/21     Chief Complaint  Patient presents with   Cough    Pt c/o cough, nasal congestion, SOB, post nasal drip, upper abd pain worsen(PMHx of hernia) x 1 day     Subjective: Melissa [name]  is a 88 y.o. pt presents for an OV with complaints of shortness of breath, severe upper abd pain and LUQ pain, PND, productive cough and nausea with vomit. She reports she has not been able to tolerate anything by mouth since she vomited when brushing her teeth. She reports the pain in her chest area is rather severe and she thinks it is her hernia.  Pt lives alone. Her son Grayland Ormond brought her to the doctor today because patient was too weak to drive and in too much pain. Grayland Ormond lives about 30-40 minutes away. Pt has a h/o diaphragmatic hernia and GERD Her son reports even on the phone she sound like she could not breathe well.      06/29/2022    1:53 PM 12/01/2021   10:07 AM 09/15/2021    9:37 AM 06/16/2021   10:42 AM 12/30/2020   10:10 AM  Depression screen PHQ 2/9  Decreased Interest 0 0 0 0 0  Down, Depressed, Hopeless 0 1 0 0 0  PHQ - 2 Score 0 1 0 0 0  Altered sleeping  3     Tired, decreased energy  2     Change in appetite  0     Feeling bad or failure about yourself   0     Trouble concentrating  0     Moving slowly or fidgety/restless  0     Suicidal thoughts  0     PHQ-9 Score  6       No Known Allergies Social History   Social History Narrative    Marital status/children/pets: Widowed.   Education/employment: 12th grade education.  Retired.   Safety:      -smoke alarm in the home:Yes     - wears seatbelt: Yes     - Feels safe in their relationships: Yes   Past Medical History:  Diagnosis Date   Burning tongue syndrome 11/11/2015   Colon polyps    Cystocele with prolapse 2019   Dr. Hulan Fray- Monitor for now.    Detached retina, right    Diabetes mellitus    Diaphragmatic hernia    GERD (gastroesophageal reflux disease)    Hyperlipidemia    Insomnia    Macular degeneration    Past Surgical History:  Procedure Laterality Date   ABDOMINAL HYSTERECTOMY     BREAST CYST ASPIRATION  11/03/2020   FOOT SURGERY     OOPHORECTOMY     Rt.ovary removed with Hyst   RETINAL DETACHMENT SURGERY     TONSILLECTOMY     Family History  Problem Relation Age of Onset   Heart disease Other    Breast cancer Other  Breast cancer Mother        metastatic--to bone   CAD Father    Cancer Maternal Grandmother        unknown   Cancer Paternal Grandmother        colon   Allergies as of 06/29/2022   No Known Allergies      Medication List        Accurate as of June 29, 2022  2:49 PM. If you have any questions, ask your nurse or doctor.          aspirin 81 MG tablet Take 81 mg by mouth daily.   atorvastatin 40 MG tablet Commonly known as: LIPITOR Take 1 tablet (40 mg total) by mouth daily.   escitalopram 5 MG tablet Commonly known as: Lexapro Take 1 tablet (5 mg total) by mouth daily.   fluocinonide cream 0.05 % Commonly known as: LIDEX Apply 1 Application topically 2 (two) times daily.   gabapentin 300 MG capsule Commonly known as: NEURONTIN Take 1 capsule (300 mg total) by mouth at bedtime.   metFORMIN 500 MG tablet Commonly known as: GLUCOPHAGE 500 mg (one tablet) in the morning and 1,000 mg (2 tablets) at night   metoprolol succinate 25 MG 24 hr tablet Commonly known as: TOPROL-XL Take 1 tablet (25 mg total)  by mouth daily. KEEP UPCOMING APPOINTMENT FOR FUTURE REFILLS.   multivitamin tablet Take 1 tablet by mouth daily.   Oyster Shell Calcium/D 250-125 MG-UNIT Tabs Take by mouth.   pantoprazole 40 MG tablet Commonly known as: PROTONIX Take 1 tablet (40 mg total) by mouth daily.   tiZANidine 4 MG tablet Commonly known as: ZANAFLEX TAKE ONE TABLET BY MOUTH AT BEDTIME FOR MUSCLE SPASM PRN        All past medical history, surgical history, allergies, family history, immunizations andmedications were updated in the EMR today and reviewed under the history and medication portions of their EMR.     ROS Negative, with the exception of above mentioned in HPI   Objective:  BP 111/63   Pulse (!) 112   Temp 99 F (37.2 C) (Oral)   Ht 5\' 3"  (1.6 m)   Wt 121 lb (54.9 kg)   SpO2 93%   BMI 21.43 kg/m  Body mass index is 21.43 kg/m. Physical Exam Vitals and nursing note reviewed.  Constitutional:      General: She is not in acute distress.    Appearance: Normal appearance. She is normal weight. She is not ill-appearing or toxic-appearing.  HENT:     Head: Normocephalic and atraumatic.     Mouth/Throat:     Mouth: Mucous membranes are dry.  Eyes:     General:        Right eye: Discharge present.     Extraocular Movements: Extraocular movements intact.     Conjunctiva/sclera: Conjunctivae normal.     Pupils: Pupils are equal, round, and reactive to light.  Cardiovascular:     Rate and Rhythm: Tachycardia present.  Pulmonary:     Effort: Respiratory distress present.     Breath sounds: Rhonchi and rales present.     Comments: Tachypnea  Chest:     Chest wall: Tenderness present.  Abdominal:     General: Abdomen is flat.     Palpations: Abdomen is soft.     Tenderness: There is abdominal tenderness. There is guarding and rebound.  Skin:    Findings: No rash.  Neurological:     Mental Status: She is alert  and oriented to person, place, and time. Mental status is at  baseline.  Psychiatric:        Mood and Affect: Mood normal.        Behavior: Behavior normal.        Thought Content: Thought content normal.        Judgment: Judgment normal.      No results found. No results found. Results for orders placed or performed in visit on 06/29/22 (from the past 24 hour(s))  POC COVID-19 BinaxNow     Status: Abnormal   Collection Time: 06/29/22  2:12 PM  Result Value Ref Range   SARS Coronavirus 2 Ag Positive (A) Negative    Assessment/Plan: Melissa [name] is a 88 y.o. female present for OV for  Covid 19/Acute cough - POC COVID-19 BinaxNow- POSITIVE J[place]a oxygen levels are low normal at 93% with labored breathing and tachycardic. She is has been unable to tolerate PO. Pt lives in her home alone.  She is covid positive today with likely Pneumonia.  She has severe abd pain in the epigastric area and can lay still on table without moaning. She though her "hernia" slipped in her chest with her coughing.  Pt is not typically a complainer and looks rather ill today. Discussed conditions with patient and her son Grayland Ormond. I feel she needs emergent care, image, labs,  Pain control and IV treatment, since not tolerating PO. She is agreeable to this approach today.    Reviewed expectations re: course of current medical issues. Discussed self-management of symptoms. Outlined signs and symptoms indicating need for more acute intervention. Patient verbalized understanding and all questions were answered. Patient received an After-Visit Summary.    Orders Placed This Encounter  Procedures   POC COVID-19 BinaxNow   No orders of the defined types were placed in this encounter.  Referral Orders  No referral(s) requested today     Note is dictated utilizing voice recognition software. Although note has been proof read prior to signing, occasional typographical errors still can be missed. If any questions arise, please do not hesitate to call for  verification.   electronically signed by:  Howard Pouch, DO  [place]

## 2022-07-08 ENCOUNTER — Telehealth: Payer: Self-pay

## 2022-07-08 NOTE — Telephone Encounter (Signed)
        Patient  visited [facility] on 06/29/2022    Telephone encounter attempt :  1st  Unable to [facility]  [phone] [address], [place], [place] [address] Phone: [phone] Email: [email or web address]

## 2022-07-11 ENCOUNTER — Telehealth: Payer: Self-pay

## 2022-07-11 NOTE — Telephone Encounter (Signed)
     Patient  visit on 1/10  at [facility]  Have you been able to follow up with your primary care physician? Yes   The patient was or was not able to obtain any needed medicine or equipment. Yes   Are there diet recommendations that you are having difficulty following? Na   Patient expresses understanding of discharge instructions and education provided has no other needs at this time.  Yes      Britt[facility], Start, Care Management  [phone] [address], [place], [place] [address] Phone: [phone] Email: [email or web address]    

## 2022-08-04 DIAGNOSIS — L308 Other specified dermatitis: Secondary | ICD-10-CM | POA: Diagnosis not present

## 2022-08-04 DIAGNOSIS — L218 Other seborrheic dermatitis: Secondary | ICD-10-CM | POA: Diagnosis not present

## 2022-08-04 DIAGNOSIS — L400 Psoriasis vulgaris: Secondary | ICD-10-CM | POA: Diagnosis not present

## 2022-08-04 DIAGNOSIS — D225 Melanocytic nevi of trunk: Secondary | ICD-10-CM | POA: Diagnosis not present

## 2022-08-05 DIAGNOSIS — H903 Sensorineural hearing loss, bilateral: Secondary | ICD-10-CM | POA: Diagnosis not present

## 2022-08-17 ENCOUNTER — Telehealth: Payer: Self-pay

## 2022-08-17 NOTE — Telephone Encounter (Signed)
Patient requesting what to take OTC for cold symptoms.  No fever, nose running, and cough.  She states she had COVID last month.  Please call [phone]

## 2022-08-17 NOTE — Telephone Encounter (Signed)
Spoke with patient regarding results/recommendations.  

## 2022-08-18 NOTE — Telephone Encounter (Signed)
Patient thinks she might need an inhaler.  I told patient Dr. Raoul Pitch would probably have to see her for office visit or virtual visit.  Patient requested for OTC meds, there are no OTC inhalers that I am aware of and she would have to prescribe an inhaler.  Please advise [phone].  Patient also wants to change to a new [facility]  - they will deliver meds to her home. Please update.

## 2022-08-18 NOTE — Telephone Encounter (Signed)
Pt scheduled for 08/19/22

## 2022-08-19 ENCOUNTER — Encounter: Payer: Self-pay | Admitting: Family Medicine

## 2022-08-19 ENCOUNTER — Ambulatory Visit: Payer: Medicare PPO | Admitting: Family Medicine

## 2022-08-19 VITALS — BP 114/75 | HR 107 | Temp 97.8°F | Wt 122.8 lb

## 2022-08-19 DIAGNOSIS — J988 Other specified respiratory disorders: Secondary | ICD-10-CM

## 2022-08-19 DIAGNOSIS — R0989 Other specified symptoms and signs involving the circulatory and respiratory systems: Secondary | ICD-10-CM

## 2022-08-19 DIAGNOSIS — R062 Wheezing: Secondary | ICD-10-CM

## 2022-08-19 LAB — POC COVID19 BINAXNOW: SARS Coronavirus 2 Ag: NEGATIVE

## 2022-08-19 MED ORDER — DOXYCYCLINE HYCLATE 100 MG PO TABS: 100.0000 mg | ORAL_TABLET | Freq: Two times a day (BID) | ORAL | 0 refills | Status: DC

## 2022-08-19 MED ORDER — METHYLPREDNISOLONE ACETATE 80 MG/ML IJ SUSP
80.0000 mg | Freq: Once | INTRAMUSCULAR | Status: AC
  Administered 2022-08-19: 80 mg via INTRAMUSCULAR

## 2022-08-19 MED ORDER — ALBUTEROL SULFATE HFA 108 (90 BASE) MCG/ACT IN AERS: 2.0000 | INHALATION_SPRAY | Freq: Four times a day (QID) | RESPIRATORY_TRACT | 0 refills | Status: DC | PRN

## 2022-08-19 NOTE — Patient Instructions (Signed)
No follow-ups on file.        Great to see you today.  I have refilled the medication(s) we provide.   If labs were collected, we will inform you of lab results once received either by echart message or telephone call.   - echart message- for normal results that have been seen by the patient already.   - telephone call: abnormal results or if patient has not viewed results in their echart.  

## 2022-08-19 NOTE — Progress Notes (Signed)
Melissa [name] , 10/19/1934, 88 y.o., female MRN: [id number] Patient Care Team    Relationship Specialty Notifications Start End  [name], DO PCP - General Family Medicine  07/03/18   Zadie Rhine Clent Demark, MD Consulting Physician Ophthalmology  07/03/18   Emily Filbert, MD Consulting Physician Obstetrics and Gynecology  07/04/18   Troy Sine, MD Consulting Physician Cardiology  07/04/18   Nunzio Cobbs, MD Consulting Physician Obstetrics and Gynecology  02/18/20   Alanson Puls, [place]    12/01/21     Chief Complaint  Patient presents with   Nasal Congestion    Chest congestion; onset Sunday afternoon; no appetite     Subjective: Melissa [name] is a 88 y.o. Pt presents for an OV with complaints of chest congestion of 5-6 days duration.  Associated symptoms include decreased appetite. Pt had a covid 19 infection 06/29/2022. Pt has tried mucinex DM to ease their symptoms.   He is tolerating p.o., however her appetite is decreased.  She has a productive cough and is taking Mucinex DM.  She denies any fevers or chills.  She is fatigued.  She feels like she is wheezing.  She is worried she will have to miss her upcoming trip.     06/29/2022    1:53 PM 12/01/2021   10:07 AM 09/15/2021    9:37 AM 06/16/2021   10:42 AM 12/30/2020   10:10 AM  Depression screen PHQ 2/9  Decreased Interest 0 0 0 0 0  Down, Depressed, Hopeless 0 1 0 0 0  PHQ - 2 Score 0 1 0 0 0  Altered sleeping  3     Tired, decreased energy  2     Change in appetite  0     Feeling bad or failure about yourself   0     Trouble concentrating  0     Moving slowly or fidgety/restless  0     Suicidal thoughts  0     PHQ-9 Score  6       No Known Allergies Social History   Social History Narrative   Marital status/children/pets: Widowed.   Education/employment: 12th grade education.  Retired.   Safety:      -smoke alarm in the home:Yes     - wears seatbelt: Yes     - Feels safe  in their relationships: Yes   Past Medical History:  Diagnosis Date   Burning tongue syndrome 11/11/2015   Colon polyps    Cystocele with prolapse 2019   Dr. Hulan Fray- Monitor for now.    Detached retina, right    Diabetes mellitus    Diaphragmatic hernia    GERD (gastroesophageal reflux disease)    Hyperlipidemia    Insomnia    Macular degeneration    Past Surgical History:  Procedure Laterality Date   ABDOMINAL HYSTERECTOMY     BREAST CYST ASPIRATION  11/03/2020   FOOT SURGERY     OOPHORECTOMY     Rt.ovary removed with Hyst   RETINAL DETACHMENT SURGERY     TONSILLECTOMY     Family History  Problem Relation Age of Onset   Heart disease Other    Breast cancer Other    Breast cancer Mother        metastatic--to bone   CAD Father    Cancer Maternal Grandmother        unknown   Cancer Paternal Grandmother  colon   Allergies as of 08/19/2022   No Known Allergies      Medication List        Accurate as of August 19, 2022 11:29 AM. If you have any questions, ask your nurse or doctor.          albuterol 108 (90 Base) MCG/ACT inhaler Commonly known as: VENTOLIN HFA Inhale 2 puffs into the lungs every 6 (six) hours as needed for wheezing or shortness of breath. Started by: Howard Pouch, DO   aspirin 81 MG tablet Take 81 mg by mouth daily.   atorvastatin 40 MG tablet Commonly known as: LIPITOR Take 1 tablet (40 mg total) by mouth daily.   doxycycline 100 MG tablet Commonly known as: VIBRA-TABS Take 1 tablet (100 mg total) by mouth 2 (two) times daily. Started by: Howard Pouch, DO   escitalopram 5 MG tablet Commonly known as: Lexapro Take 1 tablet (5 mg total) by mouth daily.   fluocinonide cream 0.05 % Commonly known as: LIDEX Apply 1 Application topically 2 (two) times daily.   gabapentin 300 MG capsule Commonly known as: NEURONTIN Take 1 capsule (300 mg total) by mouth at bedtime.   metFORMIN 500 MG tablet Commonly known as: GLUCOPHAGE 500 mg  (one tablet) in the morning and 1,000 mg (2 tablets) at night   metoprolol succinate 25 MG 24 hr tablet Commonly known as: TOPROL-XL Take 1 tablet (25 mg total) by mouth daily. KEEP UPCOMING APPOINTMENT FOR FUTURE REFILLS.   multivitamin tablet Take 1 tablet by mouth daily.   ondansetron 4 MG disintegrating tablet Commonly known as: ZOFRAN-ODT '4mg'$  ODT q4 hours prn nausea/vomit   Oyster Shell Calcium/D 250-125 MG-UNIT Tabs Take by mouth.   pantoprazole 40 MG tablet Commonly known as: PROTONIX Take 1 tablet (40 mg total) by mouth daily.   tiZANidine 4 MG tablet Commonly known as: ZANAFLEX TAKE ONE TABLET BY MOUTH AT BEDTIME FOR MUSCLE SPASM PRN        All past medical history, surgical history, allergies, family history, immunizations andmedications were updated in the EMR today and reviewed under the history and medication portions of their EMR.     Review of Systems  Constitutional:  Positive for malaise/fatigue. Negative for chills and fever.  HENT:  Positive for congestion and sinus pain.   Eyes:  Negative for discharge and redness.  Respiratory:  Positive for cough, sputum production and wheezing.   Cardiovascular: Negative.   Gastrointestinal:  Negative for constipation, diarrhea, nausea and vomiting.  Genitourinary: Negative.   Musculoskeletal: Negative.   Skin:  Negative for rash.  Neurological:  Negative for dizziness and headaches.   Negative, with the exception of above mentioned in HPI   Objective:  BP 114/75   Pulse (!) 107   Temp 97.8 F (36.6 C)   Wt 122 lb 12.8 oz (55.7 kg)   SpO2 99%   BMI 21.75 kg/m  Body mass index is 21.75 kg/m. Physical Exam Vitals and nursing note reviewed.  Constitutional:      General: She is not in acute distress.    Appearance: Normal appearance. She is not ill-appearing, toxic-appearing or diaphoretic.  HENT:     Head: Normocephalic and atraumatic.     Right Ear: Tympanic membrane, ear canal and external ear  normal.     Left Ear: Tympanic membrane, ear canal and external ear normal.     Nose: Congestion and rhinorrhea present.     Mouth/Throat:     Pharynx: No oropharyngeal exudate  or posterior oropharyngeal erythema.  Eyes:     General: No scleral icterus.       Right eye: No discharge.        Left eye: No discharge.     Extraocular Movements: Extraocular movements intact.     Conjunctiva/sclera: Conjunctivae normal.     Pupils: Pupils are equal, round, and reactive to light.  Cardiovascular:     Rate and Rhythm: Normal rate and regular rhythm.  Pulmonary:     Effort: Pulmonary effort is normal. No respiratory distress.     Breath sounds: Wheezing and rhonchi present. No rales.     Comments: Deep bronchial cough present Musculoskeletal:     Cervical back: Neck supple. No tenderness.     Right lower leg: No edema.     Left lower leg: No edema.  Lymphadenopathy:     Cervical: No cervical adenopathy.  Skin:    General: Skin is warm.     Findings: No rash.  Neurological:     Mental Status: She is alert and oriented to person, place, and time. Mental status is at baseline.     Motor: No weakness.     Gait: Gait normal.  Psychiatric:        Mood and Affect: Mood normal.        Behavior: Behavior normal.        Thought Content: Thought content normal.        Judgment: Judgment normal.     No results found. No results found. Results for orders placed or performed in visit on 08/19/22 (from the past 24 hour(s))  POC COVID-19 BinaxNow     Status: None   Collection Time: 08/19/22 11:25 AM  Result Value Ref Range   SARS Coronavirus 2 Ag Negative Negative    Assessment/Plan: Melissa [name] is a 88 y.o. female present for OV for  Bacterial respiratory infection/wheezing/chest congestion Rest, hydrate.  +/- flonase, mucinex (DM if cough), nettie pot or nasal saline.  Doxy twice daily prescribed, take until completed.  IM Depo-Medrol injection provided today Albuterol inhaler 1-2  puffs every 6 hours as needed for wheezing or shortness of breath. F/U 2 weeks if not improved.   Reviewed expectations re: course of current medical issues. Discussed self-management of symptoms. Outlined signs and symptoms indicating need for more acute intervention. Patient verbalized understanding and all questions were answered. Patient received an After-Visit Summary.    Orders Placed This Encounter  Procedures   POC COVID-19 BinaxNow   Meds ordered this encounter  Medications   doxycycline (VIBRA-TABS) 100 MG tablet    Sig: Take 1 tablet (100 mg total) by mouth 2 (two) times daily.    Dispense:  20 tablet    Refill:  0   albuterol (VENTOLIN HFA) 108 (90 Base) MCG/ACT inhaler    Sig: Inhale 2 puffs into the lungs every 6 (six) hours as needed for wheezing or shortness of breath.    Dispense:  8 g    Refill:  0   methylPREDNISolone acetate (DEPO-MEDROL) injection 80 mg   Referral Orders  No referral(s) requested today     Note is dictated utilizing voice recognition software. Although note has been proof read prior to signing, occasional typographical errors still can be missed. If any questions arise, please do not hesitate to call for verification.   electronically signed by:  Howard Pouch, DO  [place]

## 2022-08-27 DIAGNOSIS — R059 Cough, unspecified: Secondary | ICD-10-CM | POA: Diagnosis not present

## 2022-08-27 DIAGNOSIS — R071 Chest pain on breathing: Secondary | ICD-10-CM | POA: Diagnosis not present

## 2022-08-27 DIAGNOSIS — E119 Type 2 diabetes mellitus without complications: Secondary | ICD-10-CM | POA: Diagnosis not present

## 2022-09-05 ENCOUNTER — Telehealth: Payer: Self-pay

## 2022-09-05 NOTE — Telephone Encounter (Signed)
Patient calling for 3 issues today.  1)  refill requests.  Patient needs new prescriptions. [employer].  metFORMIN (GLUCOPHAGE) 500 MG tablet   pantoprazole (PROTONIX) 40 MG tablet   2) update preferred pharmacy to [facility].  Please delete [facility], [place].  3) Seen Dr. Raoul Pitch on 3/1.  Patient still has cough, runny nose.  Please advise.   Patient can be reached at [phone]

## 2022-09-06 NOTE — Telephone Encounter (Signed)
Spoke with patient regarding results/recommendations.  

## 2022-09-06 NOTE — Telephone Encounter (Signed)
Patient called back and [facility] told patient today that they could not fill the following medications because their were no refills available. Rxs could not be transferred.  Please send new [facility].   metoprolol succinate (TOPROL-XL) 25 MG 24 hr tablet  atorvastatin (LIPITOR) 40 MG tablet    Patient called yesterday, and did not mention the above medications needed to filled.  Please note: Patient states she doesn't recall our conversation and requesting meds.

## 2022-09-07 NOTE — Telephone Encounter (Signed)
Spoke with patient regarding results/recommendations.   Cardiology is managing the last 2 Rx. Pharmacy already has protonix and metformin

## 2022-09-08 ENCOUNTER — Telehealth: Payer: Self-pay | Admitting: Cardiovascular Disease

## 2022-09-08 ENCOUNTER — Telehealth: Payer: Self-pay | Admitting: Family Medicine

## 2022-09-08 DIAGNOSIS — I739 Peripheral vascular disease, unspecified: Secondary | ICD-10-CM

## 2022-09-08 DIAGNOSIS — E1169 Type 2 diabetes mellitus with other specified complication: Secondary | ICD-10-CM

## 2022-09-08 MED ORDER — ATORVASTATIN CALCIUM 40 MG PO TABS: 40.0000 mg | ORAL_TABLET | Freq: Every day | ORAL | 4 refills | Status: DC

## 2022-09-08 MED ORDER — METOPROLOL SUCCINATE ER 25 MG PO TB24: 25.0000 mg | ORAL_TABLET | Freq: Every day | ORAL | 4 refills | Status: DC

## 2022-09-08 NOTE — Telephone Encounter (Signed)
Pt has recently had all her medications transferred to [facility] in [place], [place]. She is still missing Atorvastatin and metoprolol. The pharmacy called and requested new prescriptions be written for those two medications.

## 2022-09-08 NOTE — Telephone Encounter (Signed)
*  STAT* If patient is at the pharmacy, call can be transferred to refill team.   1. Which medications need to be refilled? (please list name of each medication and dose if known) metoprolol succinate (TOPROL-XL) 25 MG 24 hr tablet atorvastatin (LIPITOR) 40 MG tablet  2. Which pharmacy/location (including street and city if local pharmacy) is medication to be sent to?[place], [place]  3. Do they need a 30 day or 90 day supply? 90 Day Supply

## 2022-09-08 NOTE — Telephone Encounter (Signed)
Atorvastatin and metoprolol sent to [facility].

## 2022-09-09 MED ORDER — METOPROLOL SUCCINATE ER 25 MG PO TB24: 25.0000 mg | ORAL_TABLET | Freq: Every day | ORAL | 4 refills | Status: DC

## 2022-09-09 MED ORDER — ATORVASTATIN CALCIUM 40 MG PO TABS: 40.0000 mg | ORAL_TABLET | Freq: Every day | ORAL | 1 refills | Status: DC

## 2022-09-09 MED ORDER — METOPROLOL SUCCINATE ER 25 MG PO TB24: 25.0000 mg | ORAL_TABLET | Freq: Every day | ORAL | 1 refills | Status: DC

## 2022-09-09 NOTE — Addendum Note (Signed)
Addended by: Howard Pouch A on: 09/09/2022 08:42 AM   Modules accepted: Orders

## 2022-09-09 NOTE — Telephone Encounter (Signed)
90 day refill has been sent to [facility].

## 2022-10-04 ENCOUNTER — Telehealth: Payer: Self-pay

## 2022-10-04 NOTE — Telephone Encounter (Signed)
Called pt to schedule AWV. Please schedule with health coach. 

## 2022-10-24 ENCOUNTER — Telehealth: Payer: Self-pay | Admitting: Family Medicine

## 2022-10-24 NOTE — Telephone Encounter (Signed)
Contacted Cecilie Lowers to schedule their annual wellness visit. Appointment made for 10/26/2022.  Gabriel Cirri [facility] AWV TEAM Direct Dial [phone]

## 2022-10-26 ENCOUNTER — Ambulatory Visit (INDEPENDENT_AMBULATORY_CARE_PROVIDER_SITE_OTHER): Payer: No Typology Code available for payment source

## 2022-10-26 VITALS — Wt 122.0 lb

## 2022-10-26 DIAGNOSIS — Z0000 Encounter for general adult medical examination without abnormal findings: Secondary | ICD-10-CM

## 2022-10-26 NOTE — Patient Instructions (Signed)
Melissa [name] , Thank you for taking time to come for your Medicare Wellness Visit. I appreciate your ongoing commitment to your health goals. Please review the following plan we discussed and let me know if I can assist you in the future.   These are the goals we discussed:  Goals      patient     Keep maintaining your health      Patient Stated     Keep on moving      Patient Stated     Maintain health and activity         This is a list of the screening recommended for you and due dates:  Health Maintenance  Topic Date Due   Eye exam for diabetics  04/13/2021   Mammogram  09/26/2022   Hemoglobin A1C  11/15/2022   Flu Shot  01/19/2023   Complete foot exam   05/18/2023   Medicare Annual Wellness Visit  10/26/2023   DTaP/Tdap/Td vaccine (2 - Td or Tdap) 01/07/2031   Pneumonia Vaccine  Completed   Zoster (Shingles) Vaccine  Completed   HPV Vaccine  Aged Out   DEXA scan (bone density measurement)  Discontinued   COVID-19 Vaccine  Discontinued    Advanced directives: Please bring a copy of your health care power of attorney and living will to the office at your convenience.  Conditions/risks identified: maintain health and active   Next appointment: Follow up in one year for your annual wellness visit    Preventive Care 65 Years and Older, Female Preventive care refers to lifestyle choices and visits with your health care provider that can promote health and wellness. What does preventive care include? A yearly physical exam. This is also called an annual well check. Dental exams once or twice a year. Routine eye exams. Ask your health care provider how often you should have your eyes checked. Personal lifestyle choices, including: Daily care of your teeth and gums. Regular physical activity. Eating a healthy diet. Avoiding tobacco and drug use. Limiting alcohol use. Practicing safe sex. Taking low-dose aspirin every day. Taking vitamin and mineral supplements as  recommended by your health care provider. What happens during an annual well check? The services and screenings done by your health care provider during your annual well check will depend on your age, overall health, lifestyle risk factors, and family history of disease. Counseling  Your health care provider may ask you questions about your: Alcohol use. Tobacco use. Drug use. Emotional well-being. Home and relationship well-being. Sexual activity. Eating habits. History of falls. Memory and ability to understand (cognition). Work and work Astronomer. Reproductive health. Screening  You may have the following tests or measurements: Height, weight, and BMI. Blood pressure. Lipid and cholesterol levels. These may be checked every 5 years, or more frequently if you are over 56 years old. Skin check. Lung cancer screening. You may have this screening every year starting at age 69 if you have a 30-pack-year history of smoking and currently smoke or have quit within the past 15 years. Fecal occult blood test (FOBT) of the stool. You may have this test every year starting at age 100. Flexible sigmoidoscopy or colonoscopy. You may have a sigmoidoscopy every 5 years or a colonoscopy every 10 years starting at age 86. Hepatitis C blood test. Hepatitis B blood test. Sexually transmitted disease (STD) testing. Diabetes screening. This is done by checking your blood sugar (glucose) after you have not eaten for a while (fasting). You may  have this done every 1-3 years. Bone density scan. This is done to screen for osteoporosis. You may have this done starting at age 79. Mammogram. This may be done every 1-2 years. Talk to your health care provider about how often you should have regular mammograms. Talk with your health care provider about your test results, treatment options, and if necessary, the need for more tests. Vaccines  Your health care provider may recommend certain vaccines, such  as: Influenza vaccine. This is recommended every year. Tetanus, diphtheria, and acellular pertussis (Tdap, Td) vaccine. You may need a Td booster every 10 years. Zoster vaccine. You may need this after age 83. Pneumococcal 13-valent conjugate (PCV13) vaccine. One dose is recommended after age 61. Pneumococcal polysaccharide (PPSV23) vaccine. One dose is recommended after age 20. Talk to your health care provider about which screenings and vaccines you need and how often you need them. This information is not intended to replace advice given to you by your health care provider. Make sure you discuss any questions you have with your health care provider. Document Released: 07/03/2015 Document Revised: 02/24/2016 Document Reviewed: 04/07/2015 Elsevier Interactive Patient Education  2017 [place] Prevention in the Home Falls can cause injuries. They can happen to people of all ages. There are many things you can do to make your home safe and to help prevent falls. What can I do on the outside of my home? Regularly fix the edges of walkways and driveways and fix any cracks. Remove anything that might make you trip as you walk through a door, such as a raised step or threshold. Trim any bushes or trees on the path to your home. Use bright outdoor lighting. Clear any walking paths of anything that might make someone trip, such as rocks or tools. Regularly check to see if handrails are loose or broken. Make sure that both sides of any steps have handrails. Any raised decks and porches should have guardrails on the edges. Have any leaves, snow, or ice cleared regularly. Use sand or salt on walking paths during winter. Clean up any spills in your garage right away. This includes oil or grease spills. What can I do in the bathroom? Use night lights. Install grab bars by the toilet and in the tub and shower. Do not use towel bars as grab bars. Use non-skid mats or decals in the tub or  shower. If you need to sit down in the shower, use a plastic, non-slip stool. Keep the floor dry. Clean up any water that spills on the floor as soon as it happens. Remove soap buildup in the tub or shower regularly. Attach bath mats securely with double-sided non-slip rug tape. Do not have throw rugs and other things on the floor that can make you trip. What can I do in the bedroom? Use night lights. Make sure that you have a light by your bed that is easy to reach. Do not use any sheets or blankets that are too big for your bed. They should not hang down onto the floor. Have a firm chair that has side arms. You can use this for support while you get dressed. Do not have throw rugs and other things on the floor that can make you trip. What can I do in the kitchen? Clean up any spills right away. Avoid walking on wet floors. Keep items that you use a lot in easy-to-reach places. If you need to reach something above you, use a strong step  stool that has a grab bar. Keep electrical cords out of the way. Do not use floor polish or wax that makes floors slippery. If you must use wax, use non-skid floor wax. Do not have throw rugs and other things on the floor that can make you trip. What can I do with my stairs? Do not leave any items on the stairs. Make sure that there are handrails on both sides of the stairs and use them. Fix handrails that are broken or loose. Make sure that handrails are as long as the stairways. Check any carpeting to make sure that it is firmly attached to the stairs. Fix any carpet that is loose or worn. Avoid having throw rugs at the top or bottom of the stairs. If you do have throw rugs, attach them to the floor with carpet tape. Make sure that you have a light switch at the top of the stairs and the bottom of the stairs. If you do not have them, ask someone to add them for you. What else can I do to help prevent falls? Wear shoes that: Do not have high heels. Have  rubber bottoms. Are comfortable and fit you well. Are closed at the toe. Do not wear sandals. If you use a stepladder: Make sure that it is fully opened. Do not climb a closed stepladder. Make sure that both sides of the stepladder are locked into place. Ask someone to hold it for you, if possible. Clearly mark and make sure that you can see: Any grab bars or handrails. First and last steps. Where the edge of each step is. Use tools that help you move around (mobility aids) if they are needed. These include: Canes. Walkers. Scooters. Crutches. Turn on the lights when you go into a dark area. Replace any light bulbs as soon as they burn out. Set up your furniture so you have a clear path. Avoid moving your furniture around. If any of your floors are uneven, fix them. If there are any pets around you, be aware of where they are. Review your medicines with your doctor. Some medicines can make you feel dizzy. This can increase your chance of falling. Ask your doctor what other things that you can do to help prevent falls. This information is not intended to replace advice given to you by your health care provider. Make sure you discuss any questions you have with your health care provider. Document Released: 04/02/2009 Document Revised: 11/12/2015 Document Reviewed: 07/11/2014 Elsevier Interactive Patient Education  2017 [employer].

## 2022-10-26 NOTE — Progress Notes (Signed)
I connected with  Melissa [name] on 10/26/22 by a audio enabled telemedicine application and verified that I am speaking with the correct person using two identifiers.  Patient Location: Home  Provider Location: Home Office  I discussed the limitations of evaluation and management by telemedicine. The patient expressed understanding and agreed to proceed.   Subjective:   Melissa [name] is a 88 y.o. female who presents for Medicare Annual (Subsequent) preventive examination.  Review of Systems     Cardiac Risk Factors include: advanced age (>37men, >92 women);diabetes mellitus;dyslipidemia     Objective:    Today's Vitals   10/26/22 1328  Weight: 122 lb (55.3 kg)   Body mass index is 21.61 kg/m.     09/15/2021    9:38 AM 08/26/2020   12:48 PM 08/31/2016    9:53 AM  Advanced Directives  Does Patient Have a Medical Advance Directive? Yes Yes Yes  Type of Estate agent of [employer] Power of Advance;Living will   Copy of Healthcare Power of Attorney in Chart? No - copy requested No - copy requested     Current Medications (verified) Outpatient Encounter Medications as of 10/26/2022  Medication Sig   albuterol (VENTOLIN HFA) 108 (90 Base) MCG/ACT inhaler Inhale 2 puffs into the lungs every 6 (six) hours as needed for wheezing or shortness of breath.   aspirin 81 MG tablet Take 81 mg by mouth daily.   atorvastatin (LIPITOR) 40 MG tablet Take 1 tablet (40 mg total) by mouth daily.   Calcium Carbonate-Vitamin D (OYSTER SHELL CALCIUM/D) 250-125 MG-UNIT TABS Take by mouth.   escitalopram (LEXAPRO) 5 MG tablet Take 1 tablet (5 mg total) by mouth daily.   fluocinonide cream (LIDEX) 0.05 % Apply 1 Application topically 2 (two) times daily.   gabapentin (NEURONTIN) 300 MG capsule Take 1 capsule (300 mg total) by mouth at bedtime.   metFORMIN (GLUCOPHAGE) 500 MG tablet 500 mg (one tablet) in the morning and 1,000 mg (2 tablets) at night   metoprolol succinate  (TOPROL-XL) 25 MG 24 hr tablet Take 1 tablet (25 mg total) by mouth daily. KEEP UPCOMING APPOINTMENT FOR FUTURE REFILLS.   Multiple Vitamin (MULTIVITAMIN) tablet Take 1 tablet by mouth daily.   ondansetron (ZOFRAN-ODT) 4 MG disintegrating tablet 4mg  ODT q4 hours prn nausea/vomit   pantoprazole (PROTONIX) 40 MG tablet Take 1 tablet (40 mg total) by mouth daily.   tiZANidine (ZANAFLEX) 4 MG tablet TAKE ONE TABLET BY MOUTH AT BEDTIME FOR MUSCLE SPASM PRN (Patient not taking: Reported on 10/26/2022)   [DISCONTINUED] doxycycline (VIBRA-TABS) 100 MG tablet Take 1 tablet (100 mg total) by mouth 2 (two) times daily.   No facility-administered encounter medications on file as of 10/26/2022.    Allergies (verified) Patient has no known allergies.   History: Past Medical History:  Diagnosis Date   Burning tongue syndrome 11/11/2015   Colon polyps    Cystocele with prolapse 2019   Dr. Marice Potter- Monitor for now.    Detached retina, right    Diabetes mellitus    Diaphragmatic hernia    GERD (gastroesophageal reflux disease)    Hyperlipidemia    Insomnia    Macular degeneration    Past Surgical History:  Procedure Laterality Date   ABDOMINAL HYSTERECTOMY     BREAST CYST ASPIRATION  11/03/2020   FOOT SURGERY     OOPHORECTOMY     Rt.ovary removed with Hyst   RETINAL DETACHMENT SURGERY     TONSILLECTOMY  Family History  Problem Relation Age of Onset   Heart disease Other    Breast cancer Other    Breast cancer Mother        metastatic--to bone   CAD Father    Cancer Maternal Grandmother        unknown   Cancer Paternal Grandmother        colon   Social History   Socioeconomic History   Marital status: Widowed    Spouse name: Not on file   Number of children: Not on file   Years of education: Not on file   Highest education level: Not on file  Occupational History   Not on file  Tobacco Use   Smoking status: Never   Smokeless tobacco: Never  Vaping Use   Vaping Use: Never used   Substance and Sexual Activity   Alcohol use: No   Drug use: No   Sexual activity: Not Currently    Birth control/protection: Surgical    Comment: Hyst--Lt.ovary remains  Other Topics Concern   Not on file  Social History Narrative   Marital status/children/pets: Widowed.   Education/employment: 12th grade education.  Retired.   Safety:      -smoke alarm in the home:Yes     - wears seatbelt: Yes     - Feels safe in their relationships: Yes   Social Determinants of Health   Financial Resource Strain: Low Risk  (10/26/2022)   Overall Financial Resource Strain (CARDIA)    Difficulty of Paying Living Expenses: Not hard at all  Food Insecurity: No Food Insecurity (10/26/2022)   Hunger Vital Sign    Worried About Running Out of Food in the Last Year: Never true    Ran Out of Food in the Last Year: Never true  Transportation Needs: No Transportation Needs (10/26/2022)   PRAPARE - Administrator, Civil Service (Medical): No    Lack of Transportation (Non-Medical): No  Physical Activity: Insufficiently Active (10/26/2022)   Exercise Vital Sign    Days of Exercise per Week: 7 days    Minutes of Exercise per Session: 20 min  Stress: No Stress Concern Present (10/26/2022)   [employer] of Occupational Health - Occupational Stress Questionnaire    Feeling of Stress : Not at all  Social Connections: Moderately Integrated (10/26/2022)   Social Connection and Isolation Panel [NHANES]    Frequency of Communication with Friends and Family: More than three times a week    Frequency of Social Gatherings with Friends and Family: More than three times a week    Attends Religious Services: More than 4 times per year    Active Member of [employer] or Organizations: Yes    Attends Banker Meetings: 1 to 4 times per year    Marital Status: Widowed    Tobacco Counseling Counseling given: Not Answered   Clinical Intake:  Pre-visit preparation completed: Yes  Pain : No/denies  pain     BMI - recorded: 21.61 Nutritional Status: BMI of 19-24  Normal Nutritional Risks: None Diabetes: Yes CBG done?: No Did pt. bring in CBG monitor from home?: No  How often do you need to have someone help you when you read instructions, pamphlets, or other written materials from your doctor or pharmacy?: 1 - Never  Diabetic?Nutrition Risk Assessment:  Has the patient had any N/V/D within the last 2 months?  No  Does the patient have any non-healing wounds?  No  Has the patient had any  unintentional weight loss or weight gain?  No   Diabetes:  Is the patient diabetic?  Yes  If diabetic, was a CBG obtained today?  No  Did the patient bring in their glucometer from home?  No  How often do you monitor your CBG's? N/a.   Financial Strains and Diabetes Management:  Are you having any financial strains with the device, your supplies or your medication? No .  Does the patient want to be seen by Chronic Care Management for management of their diabetes?  No  Would the patient like to be referred to a Nutritionist or for Diabetic Management?  No   Diabetic Exams:  Diabetic Eye Exam: Overdue for diabetic eye exam. Pt has been advised about the importance in completing this exam. Patient advised to call and schedule an eye exam. Diabetic Foot Exam: Completed 05/17/22   Interpreter Needed?: No  Information entered by :: Lanier Ensign, LPN   Activities of Daily Living    10/26/2022    1:37 PM  In your present state of health, do you have any difficulty performing the following activities:  Hearing? 0  Vision? 0  Difficulty concentrating or making decisions? 0  Walking or climbing stairs? 0  Dressing or bathing? 0  Doing errands, shopping? 0  Preparing Food and eating ? N  Using the Toilet? N  In the past six months, have you accidently leaked urine? N  Do you have problems with loss of bowel control? N  Managing your Medications? N  Managing your Finances? N   Housekeeping or managing your Housekeeping? N    Patient Care Team: Natalia Leatherwood, DO as PCP - General (Family Medicine) Edmon Crape, MD as Consulting Physician (Ophthalmology) Allie Bossier, MD as Consulting Physician (Obstetrics and Gynecology) Lennette Bihari, MD as Consulting Physician (Cardiology) Patton Salles, MD as Consulting Physician (Obstetrics and Gynecology) [facility], [facility]  Indicate any recent Medical Services you may have received from other than [facility] providers in the past year (date may be approximate).     Assessment:   This is a routine wellness examination for Melissa [name].  Hearing/Vision screen Hearing Screening - Comments:: Pt stated HOH Vision Screening - Comments:: Pt Follow up with Dr Luciana Axe for annual eye exam  Dietary issues and exercise activities discussed: Current Exercise Habits: Home exercise routine, Type of exercise: walking, Time (Minutes): 20, Frequency (Times/Week): 5, Weekly Exercise (Minutes/Week): 100   Goals Addressed             This Visit's Progress    Patient Stated       Maintain health and activity        Depression Screen    10/26/2022    1:32 PM 06/29/2022    1:53 PM 12/01/2021   10:07 AM 09/15/2021    9:37 AM 06/16/2021   10:42 AM 12/30/2020   10:10 AM 08/26/2020   12:51 PM  PHQ 2/9 Scores  PHQ - 2 Score 0 0 1 0 0 0 0  PHQ- 9 Score   6        Fall Risk    10/26/2022    1:37 PM 06/29/2022    1:53 PM 09/15/2021    9:38 AM 06/16/2021   10:43 AM 12/30/2020   10:10 AM  Fall Risk   Falls in the past year? 0 0 0 0 0  Number falls in past yr: 0 0 0 0 0  Injury with Fall? 0  0 0 0 0  Risk for fall due to : Impaired vision No Fall Risks Impaired vision No Fall Risks   Follow up Falls prevention discussed Falls evaluation completed Falls prevention discussed Falls evaluation completed Falls evaluation completed    FALL RISK PREVENTION PERTAINING TO THE HOME:  Any stairs in or around the  home? Yes  If so, are there any without handrails? No  Home free of loose throw rugs in walkways, pet beds, electrical cords, etc? Yes  Adequate lighting in your home to reduce risk of falls? Yes   ASSISTIVE DEVICES UTILIZED TO PREVENT FALLS:  Life alert? No  Use of a cane, walker or w/c? No  Grab bars in the bathroom? No  Shower chair or bench in shower? Yes  Elevated toilet seat or a handicapped toilet? No   TIMED UP AND GO:  Was the test performed? No .   Cognitive Function:        10/26/2022    1:38 PM 09/15/2021    9:43 AM  6CIT Screen  What Year? 0 points 0 points  What month? 0 points 0 points  What time? 0 points 0 points  Count back from 20 0 points 0 points  Months in reverse 0 points 0 points  Repeat phrase 2 points 4 points  Total Score 2 points 4 points    Immunizations Immunization History  Administered Date(s) Administered   Fluad Quad(high Dose 65+) 03/06/2019, 04/03/2019, 04/15/2020, 04/13/2021, 03/23/2022   Influenza Inj Mdck Quad Pf 03/06/2019, 04/03/2019, 04/15/2020   Influenza Split 04/18/2011, 04/05/2013   Influenza Whole 03/24/2009, 04/17/2014   Influenza, High Dose Seasonal PF 04/17/2014, 03/20/2016, 03/06/2019   Influenza, Seasonal, Injecte, Preservative Fre 04/07/2015   Influenza,inj,Quad PF,6+ Mos 04/16/2018   Influenza-Unspecified 04/18/2011, 03/20/2013, 04/05/2013, 04/07/2015, 04/16/2018, 04/03/2019   Meningococcal Conjugate 04/05/2017   Moderna Covid-19 Vaccine Bivalent Booster 89yrs & up 06/29/2021   Moderna Sars-Covid-2 Vaccination 08/21/2019, 09/18/2019, 04/15/2020, 10/13/2020   PNEUMOCOCCAL CONJUGATE-20 04/13/2021   Pneumococcal Conjugate-13 10/02/2013, 10/02/2013, 01/03/2018   Pneumococcal Polysaccharide-23 06/21/2003, 05/21/2019   Respiratory Syncytial Virus Vaccine,Recomb Aduvanted(Arexvy) 03/23/2022   Tdap 01/06/2021   Zoster Recombinat (Shingrix) 05/21/2019, 07/23/2019   Zoster, Live 08/16/2014   Zoster, Unspecified 05/21/2019     TDAP status: Up to date  Flu Vaccine status: Up to date  Pneumococcal vaccine status: Up to date  Covid-19 vaccine status: Completed vaccines  Qualifies for Shingles Vaccine? Yes   Zostavax completed Yes   Shingrix Completed?: Yes  Screening Tests Health Maintenance  Topic Date Due   OPHTHALMOLOGY EXAM  04/13/2021   MAMMOGRAM  09/26/2022   HEMOGLOBIN A1C  11/15/2022   INFLUENZA VACCINE  01/19/2023   FOOT EXAM  05/18/2023   Medicare Annual Wellness (AWV)  10/26/2023   DTaP/Tdap/Td (2 - Td or Tdap) 01/07/2031   Pneumonia Vaccine 32+ Years old  Completed   Zoster Vaccines- Shingrix  Completed   HPV VACCINES  Aged Out   DEXA SCAN  Discontinued   COVID-19 Vaccine  Discontinued    Health Maintenance  Health Maintenance Due  Topic Date Due   OPHTHALMOLOGY EXAM  04/13/2021   MAMMOGRAM  09/26/2022    Colorectal cancer screening: No longer required.   Mammogram status: Completed 11/03/20. Repeat every year     Additional Screening:   Vision Screening: Recommended annual ophthalmology exams for early detection of glaucoma and other disorders of the eye. Is the patient up to date with their annual eye exam?  No  Who is the provider  or what is the name of the office in which the patient attends annual eye exams? Dr Luciana Axe  If pt is not established with a provider, would they like to be referred to a provider to establish care? No .   Dental Screening: Recommended annual dental exams for proper oral hygiene  Community Resource Referral / Chronic Care Management: CRR required this visit?  No   CCM required this visit?  No      Plan:     I have personally reviewed and noted the following in the patient's chart:   Medical and social history Use of alcohol, tobacco or illicit drugs  Current medications and supplements including opioid prescriptions. Patient is not currently taking opioid prescriptions. Functional ability and status Nutritional status Physical  activity Advanced directives List of other physicians Hospitalizations, surgeries, and ER visits in previous 12 months Vitals Screenings to include cognitive, depression, and falls Referrals and appointments  In addition, I have reviewed and discussed with patient certain preventive protocols, quality metrics, and best practice recommendations. A written personalized care plan for preventive services as well as general preventive health recommendations were provided to patient.     Marzella Schlein, LPN   09/22/4096   Nurse Notes: none

## 2022-11-08 DIAGNOSIS — E113551 Type 2 diabetes mellitus with stable proliferative diabetic retinopathy, right eye: Secondary | ICD-10-CM | POA: Diagnosis not present

## 2022-11-08 DIAGNOSIS — E113292 Type 2 diabetes mellitus with mild nonproliferative diabetic retinopathy without macular edema, left eye: Secondary | ICD-10-CM | POA: Diagnosis not present

## 2022-11-08 DIAGNOSIS — H353121 Nonexudative age-related macular degeneration, left eye, early dry stage: Secondary | ICD-10-CM | POA: Diagnosis not present

## 2022-11-08 DIAGNOSIS — H353114 Nonexudative age-related macular degeneration, right eye, advanced atrophic with subfoveal involvement: Secondary | ICD-10-CM | POA: Diagnosis not present

## 2022-11-08 DIAGNOSIS — H35362 Drusen (degenerative) of macula, left eye: Secondary | ICD-10-CM | POA: Diagnosis not present

## 2022-11-08 DIAGNOSIS — H43812 Vitreous degeneration, left eye: Secondary | ICD-10-CM | POA: Diagnosis not present

## 2022-11-09 ENCOUNTER — Ambulatory Visit (INDEPENDENT_AMBULATORY_CARE_PROVIDER_SITE_OTHER): Payer: No Typology Code available for payment source | Admitting: Family Medicine

## 2022-11-09 ENCOUNTER — Encounter: Payer: Self-pay | Admitting: Family Medicine

## 2022-11-09 VITALS — BP 105/69 | HR 97 | Temp 97.3°F | Wt 122.6 lb

## 2022-11-09 DIAGNOSIS — F419 Anxiety disorder, unspecified: Secondary | ICD-10-CM | POA: Diagnosis not present

## 2022-11-09 DIAGNOSIS — N3945 Continuous leakage: Secondary | ICD-10-CM | POA: Diagnosis not present

## 2022-11-09 DIAGNOSIS — E1169 Type 2 diabetes mellitus with other specified complication: Secondary | ICD-10-CM

## 2022-11-09 DIAGNOSIS — E785 Hyperlipidemia, unspecified: Secondary | ICD-10-CM

## 2022-11-09 DIAGNOSIS — E113213 Type 2 diabetes mellitus with mild nonproliferative diabetic retinopathy with macular edema, bilateral: Secondary | ICD-10-CM

## 2022-11-09 DIAGNOSIS — N8110 Cystocele, unspecified: Secondary | ICD-10-CM | POA: Diagnosis not present

## 2022-11-09 DIAGNOSIS — K219 Gastro-esophageal reflux disease without esophagitis: Secondary | ICD-10-CM

## 2022-11-09 DIAGNOSIS — Z7984 Long term (current) use of oral hypoglycemic drugs: Secondary | ICD-10-CM | POA: Diagnosis not present

## 2022-11-09 DIAGNOSIS — G8929 Other chronic pain: Secondary | ICD-10-CM

## 2022-11-09 DIAGNOSIS — M546 Pain in thoracic spine: Secondary | ICD-10-CM

## 2022-11-09 DIAGNOSIS — I739 Peripheral vascular disease, unspecified: Secondary | ICD-10-CM

## 2022-11-09 LAB — MICROALBUMIN / CREATININE URINE RATIO
Creatinine,U: 165.9 mg/dL
Microalb Creat Ratio: 0.9 mg/g (ref 0.0–30.0)
Microalb, Ur: 1.5 mg/dL (ref 0.0–1.9)

## 2022-11-09 LAB — COMPREHENSIVE METABOLIC PANEL
ALT: 9 U/L (ref 0–35)
AST: 14 U/L (ref 0–37)
Albumin: 4.2 g/dL (ref 3.5–5.2)
Alkaline Phosphatase: 67 U/L (ref 39–117)
BUN: 25 mg/dL — ABNORMAL HIGH (ref 6–23)
CO2: 26 mEq/L (ref 19–32)
Calcium: 9.7 mg/dL (ref 8.4–10.5)
Chloride: 104 mEq/L (ref 96–112)
Creatinine, Ser: 1.02 mg/dL (ref 0.40–1.20)
GFR: 49.98 mL/min — ABNORMAL LOW (ref >60.00)
Glucose, Bld: 118 mg/dL — ABNORMAL HIGH (ref 70–99)
Potassium: 4.9 mEq/L (ref 3.5–5.1)
Sodium: 139 mEq/L (ref 135–145)
Total Bilirubin: 0.5 mg/dL (ref 0.2–1.2)
Total Protein: 6.9 g/dL (ref 6.0–8.3)

## 2022-11-09 LAB — LIPID PANEL
Cholesterol: 185 mg/dL (ref 0–200)
HDL: 74.4 mg/dL (ref >39.00)
LDL Cholesterol: 89 mg/dL (ref 0–99)
NonHDL: 110.67
Total CHOL/HDL Ratio: 2
Triglycerides: 108 mg/dL (ref 0.0–149.0)
VLDL: 21.6 mg/dL (ref 0.0–40.0)

## 2022-11-09 LAB — HEMOGLOBIN A1C: Hgb A1c MFr Bld: 6.9 % — ABNORMAL HIGH (ref 4.6–6.5)

## 2022-11-09 MED ORDER — ESCITALOPRAM OXALATE 5 MG PO TABS: 5.0000 mg | ORAL_TABLET | Freq: Every day | ORAL | 1 refills | Status: DC

## 2022-11-09 MED ORDER — METFORMIN HCL 500 MG PO TABS: ORAL_TABLET | ORAL | 1 refills | Status: DC

## 2022-11-09 MED ORDER — GABAPENTIN 300 MG PO CAPS: 300.0000 mg | ORAL_CAPSULE | Freq: Every day | ORAL | 1 refills | Status: DC

## 2022-11-09 MED ORDER — METOPROLOL SUCCINATE ER 25 MG PO TB24: 25.0000 mg | ORAL_TABLET | Freq: Every day | ORAL | 1 refills | Status: DC

## 2022-11-09 MED ORDER — PANTOPRAZOLE SODIUM 40 MG PO TBEC: 40.0000 mg | DELAYED_RELEASE_TABLET | Freq: Every day | ORAL | 3 refills | Status: DC

## 2022-11-09 MED ORDER — ATORVASTATIN CALCIUM 40 MG PO TABS: 40.0000 mg | ORAL_TABLET | Freq: Every day | ORAL | 1 refills | Status: DC

## 2022-11-09 NOTE — Progress Notes (Signed)
Patient Care Team    Relationship Specialty Notifications Start End  Natalia Leatherwood, DO PCP - General Family Medicine  07/03/18   Rankin, Alford Highland, MD Consulting Physician Ophthalmology  07/03/18   Allie Bossier, MD Consulting Physician Obstetrics and Gynecology  07/04/18   Lennette Bihari, MD Consulting Physician Cardiology  07/04/18   Patton Salles, MD Consulting Physician Obstetrics and Gynecology  02/18/20   Ponciano Ort, [facility]    12/01/21     SUBJECTIVE Chief Complaint  Patient presents with   Hyperlipidemia    Pt is fasting.     HPI: Melissa [name] is a 88 y.o. present for f/u [facility] Diabetes/macular degeneration/nonproliferative retinopathy:  Pt reports compliance with metformin 500 mg in the morning and 1000 mg in the afternoon. Patient denies dizziness, hyperglycemic or hypoglycemic events. Patient denies numbness, tingling in the extremities or nonhealing wounds of feet.  She is prescribed gabapentin 300 mg nightly for back pain.    Palpitations/hyperlipidemia/peripheral artery disease/anxiety: She reports compliance with her metoprolol 25 mg daily, lexapro 5 mg qd. She is prescribed Lipitor 40 mg daily and takes a daily baby aspirin.   Echo Doppler study on October 03, 2017 showed an EF of 65 to 70%.  There was severe mitral annular calcification with very mild functional MS by valve area.  Patient denies chest pain, shortness of breath, dizziness or lower extremity edema.   GERD: Patient reports Her GERD is well controlled.  She is compliant with Protonix 40 mg daily.  She has been on this for quite some time and takes it daily. Without medication her symptoms return.   Chronic back pain  Patient reports compliance with gabapentin has been extremely helpful. She only occ.will use the Zanaflex and but make sure not to take at the same time as her gabapentin.  She deniesany oversedation.   ROS: See pertinent positives and negatives per  HPI.  Patient Active Problem List   Diagnosis Date Noted   Diverticulitis 01/26/2022   Callus 10/21/2020   Early stage nonexudative age-related macular degeneration of left eye 04/13/2020   Advanced nonexudative age-related macular degeneration of right eye with subfoveal involvement 04/01/2020   Degenerative retinal drusen of left eye 04/01/2020   Pseudophakia, both eyes 04/01/2020   Posterior vitreous detachment of left eye 04/01/2020   Anxiety 03/05/2019   Chronic bilateral thoracic back pain 08/03/2018   Iron deficiency anemia 07/04/2018   Palpitations 07/04/2018   Peripheral arterial disease (HCC) 07/11/2017   Varicose veins of both lower extremities 08/31/2016   Macular degeneration 05/05/2015   Hiatal hernia 01/28/2015   Mild nonproliferative retinopathy of both eyes with macular edema associated with type 2 diabetes mellitus (HCC) 10/02/2013   Dm2 w/ Hyperlipidemia(HCC) 03/24/2009   GERD 03/24/2009    Social History   Tobacco Use   Smoking status: Never   Smokeless tobacco: Never  Substance Use Topics   Alcohol use: No    Current Outpatient Medications:    albuterol (VENTOLIN HFA) 108 (90 Base) MCG/ACT inhaler, Inhale 2 puffs into the lungs every 6 (six) hours as needed for wheezing or shortness of breath., Disp: 8 g, Rfl: 0   aspirin 81 MG tablet, Take 81 mg by mouth daily., Disp: , Rfl:    Calcium Carbonate-Vitamin D (OYSTER SHELL CALCIUM/D) 250-125 MG-UNIT TABS, Take by mouth., Disp: , Rfl:    fluocinonide cream (LIDEX) 0.05 %, Apply 1 Application topically 2 (two) times daily., Disp: 30  g, Rfl: 2   Multiple Vitamin (MULTIVITAMIN) tablet, Take 1 tablet by mouth daily., Disp: , Rfl:    atorvastatin (LIPITOR) 40 MG tablet, Take 1 tablet (40 mg total) by mouth daily., Disp: 90 tablet, Rfl: 1   escitalopram (LEXAPRO) 5 MG tablet, Take 1 tablet (5 mg total) by mouth daily., Disp: 90 tablet, Rfl: 1   gabapentin (NEURONTIN) 300 MG capsule, Take 1 capsule (300 mg total) by  mouth at bedtime., Disp: 90 capsule, Rfl: 1   metFORMIN (GLUCOPHAGE) 500 MG tablet, 500 mg (one tablet) in the morning and 1,000 mg (2 tablets) at night, Disp: 270 tablet, Rfl: 1   metoprolol succinate (TOPROL-XL) 25 MG 24 hr tablet, Take 1 tablet (25 mg total) by mouth daily., Disp: 90 tablet, Rfl: 1   pantoprazole (PROTONIX) 40 MG tablet, Take 1 tablet (40 mg total) by mouth daily., Disp: 90 tablet, Rfl: 3  No Known Allergies  OBJECTIVE: BP 105/69   Pulse 97   Temp (!) 97.3 F (36.3 C)   Wt 122 lb 9.6 oz (55.6 kg)   SpO2 95%   BMI 21.72 kg/m  Physical Exam Vitals and nursing note reviewed.  Constitutional:      General: She is not in acute distress.    Appearance: Normal appearance. She is not ill-appearing, toxic-appearing or diaphoretic.  HENT:     Head: Normocephalic and atraumatic.     Mouth/Throat:     Mouth: Mucous membranes are moist.  Eyes:     General: No scleral icterus.       Right eye: No discharge.        Left eye: No discharge.     Extraocular Movements: Extraocular movements intact.     Conjunctiva/sclera: Conjunctivae normal.     Pupils: Pupils are equal, round, and reactive to light.  Cardiovascular:     Rate and Rhythm: Normal rate and regular rhythm.  Pulmonary:     Effort: Pulmonary effort is normal. No respiratory distress.     Breath sounds: Normal breath sounds. No wheezing, rhonchi or rales.  Musculoskeletal:     Cervical back: Neck supple. No tenderness.     Right lower leg: No edema.     Left lower leg: No edema.  Lymphadenopathy:     Cervical: No cervical adenopathy.  Skin:    General: Skin is warm and dry.     Coloration: Skin is not jaundiced or pale.     Findings: No erythema or rash.  Neurological:     Mental Status: She is alert and oriented to person, place, and time. Mental status is at baseline.     Motor: No weakness.     Gait: Gait normal.  Psychiatric:        Mood and Affect: Mood normal.        Behavior: Behavior normal.         Thought Content: Thought content normal.        Judgment: Judgment normal.    Diabetic Foot Exam - Simple   Simple Foot Form Diabetic Foot exam was performed with the following findings: Yes 11/09/2022 10:12 AM  Visual Inspection No deformities, no ulcerations, no other skin breakdown bilaterally: Yes Sensation Testing Intact to touch and monofilament testing bilaterally: Yes Pulse Check Posterior Tibialis and Dorsalis pulse intact bilaterally: Yes Comments      No results found for this or any previous visit (from the past 24 hour(s)).    ASSESSMENT AND PLAN: Melissa [name] is a 88  y.o. female present for  Controlled type 2 diabetes mellitus (HCC)/Type 2 DM mild nonproliferative retinopathy, macular edema, uncontrol (HCC) Stable Continue   metformin 1500 mg total a day- may divide. PNA series: Completed 2019 Flu shot: Completed 2022 Urine microal: UTD 11/2021 Foot exam: completed  05/14/2022 Eye exam: Dr. Dione Booze, routine eye exams with macular degeneration.11/08/2022, Dr. Luciana Axe A1c: 6.5>>7.3 >>6.3>>6.4> 6.8> 6.5 > 6.4 >6.8 > 6.4> collected today   Hyperlipidemia associated (HCC)/Peripheral arterial disease (HCC)/Palpitations/IDA/CKD 3 - Continue to  avoid caffeine, stimulants-her vice is excess chocolate. - continue  metoprolol 25 mg QD> prescribed by cardio - continue  atorvastatin 40 mg daily> prescribed by cardio - continue  baby aspirin daily. - avoid NSAIDS; renal dose.   Anxiety/insomnia Stable Continue  lexapro 5 mg QD-tolerating She discontinued  Trazodone 25-50 mg nightly (felt made her shaky)  Gastroesophageal reflux disease without esophagitis Stable  continue Protonix -Encouraged her to try Biotene 2 times a day.    Fecal smearing/hemorrhoids: Evaluated by Dr. Kinnie Scales  Chronic thoracic back pain and new left jaw discomfort: Stable  Continue gabapentin nightly  Continue  Zanaflex prn  Return in about 24 weeks (around 04/26/2023).    Orders  Placed This Encounter  Procedures   Urine Culture   Comprehensive metabolic panel   Lipid panel   Urine Microalbumin w/creat. ratio   Hemoglobin A1c    Meds ordered this encounter  Medications   escitalopram (LEXAPRO) 5 MG tablet    Sig: Take 1 tablet (5 mg total) by mouth daily.    Dispense:  90 tablet    Refill:  1   gabapentin (NEURONTIN) 300 MG capsule    Sig: Take 1 capsule (300 mg total) by mouth at bedtime.    Dispense:  90 capsule    Refill:  1   metFORMIN (GLUCOPHAGE) 500 MG tablet    Sig: 500 mg (one tablet) in the morning and 1,000 mg (2 tablets) at night    Dispense:  270 tablet    Refill:  1   atorvastatin (LIPITOR) 40 MG tablet    Sig: Take 1 tablet (40 mg total) by mouth daily.    Dispense:  90 tablet    Refill:  1   metoprolol succinate (TOPROL-XL) 25 MG 24 hr tablet    Sig: Take 1 tablet (25 mg total) by mouth daily.    Dispense:  90 tablet    Refill:  1   pantoprazole (PROTONIX) 40 MG tablet    Sig: Take 1 tablet (40 mg total) by mouth daily.    Dispense:  90 tablet    Refill:  3      Melissa Trostle Claiborne Billings, DO 11/09/2022

## 2022-11-09 NOTE — Patient Instructions (Addendum)
Return in about 24 weeks (around 04/26/2023).        Great to see you today.  I have refilled the medication(s) we provide.   If labs were collected, we will inform you of lab results once received either by echart message or telephone call.   - echart message- for normal results that have been seen by the patient already.   - telephone call: abnormal results or if patient has not viewed results in their echart.

## 2022-11-12 LAB — URINE CULTURE
MICRO NUMBER:: 14996221
SPECIMEN QUALITY:: ADEQUATE

## 2022-11-15 ENCOUNTER — Telehealth: Payer: Self-pay | Admitting: Family Medicine

## 2022-11-15 MED ORDER — NITROFURANTOIN MONOHYD MACRO 100 MG PO CAPS: 100.0000 mg | ORAL_CAPSULE | Freq: Two times a day (BID) | ORAL | 0 refills | Status: DC

## 2022-11-15 NOTE — Telephone Encounter (Signed)
Pt advised of results/instructions. 

## 2022-11-15 NOTE — Telephone Encounter (Signed)
Please call pt The urine culture result is positive for infection.  I have called in a medication called macrobid for her to take every 12 hours for 7 days to treat

## 2022-11-17 DIAGNOSIS — H903 Sensorineural hearing loss, bilateral: Secondary | ICD-10-CM | POA: Diagnosis not present

## 2022-12-26 ENCOUNTER — Ambulatory Visit: Payer: No Typology Code available for payment source | Admitting: Family Medicine

## 2022-12-26 ENCOUNTER — Encounter: Payer: Self-pay | Admitting: Family Medicine

## 2022-12-26 VITALS — BP 107/66 | HR 70 | Temp 98.1°F | Wt 123.6 lb

## 2022-12-26 DIAGNOSIS — K146 Glossodynia: Secondary | ICD-10-CM | POA: Diagnosis not present

## 2022-12-26 NOTE — Progress Notes (Signed)
[name] , 1935/05/13, 88 y.o., female MRN: [id number] Patient Care Team    Relationship Specialty Notifications Start End  Natalia Leatherwood, DO PCP - General Family Medicine  07/03/18   Rankin, Alford Highland, MD Consulting Physician Ophthalmology  07/03/18   Allie Bossier, MD Consulting Physician Obstetrics and Gynecology  07/04/18   Lennette Bihari, MD Consulting Physician Cardiology  07/04/18   Patton Salles, MD Consulting Physician Obstetrics and Gynecology  02/18/20   Pllc, [facility]    12/01/21     Chief Complaint  Patient presents with   burning tongue    States peroxide helps some although it burns sometimes too. Has desonide ointment that she has used a few weeks ago; Rx is expired 2019     Subjective: Melissa [name] is a 88 y.o. Pt presents for an OV with complaints of left sided burning tongue. No injury. No thrush symptoms.      12/26/2022    9:33 AM 10/26/2022    1:32 PM 06/29/2022    1:53 PM 12/01/2021   10:07 AM 09/15/2021    9:37 AM  Depression screen PHQ 2/9  Decreased Interest 0 0 0 0 0  Down, Depressed, Hopeless 0 0 0 1 0  PHQ - 2 Score 0 0 0 1 0  Altered sleeping    3   Tired, decreased energy    2   Change in appetite    0   Feeling bad or failure about yourself     0   Trouble concentrating    0   Moving slowly or fidgety/restless    0   Suicidal thoughts    0   PHQ-9 Score    6     No Known Allergies Social History   Social History Narrative   Marital status/children/pets: Widowed.   Education/employment: 12th grade education.  Retired.   Safety:      -smoke alarm in the home:Yes     - wears seatbelt: Yes     - Feels safe in their relationships: Yes   Past Medical History:  Diagnosis Date   Burning tongue syndrome 11/11/2015   Colon polyps    Cystocele with prolapse 2019   Dr. Marice Potter- Monitor for now.    Detached retina, right    Diabetes mellitus    Diaphragmatic hernia    GERD (gastroesophageal reflux  disease)    Hyperlipidemia    Insomnia    Macular degeneration    Past Surgical History:  Procedure Laterality Date   ABDOMINAL HYSTERECTOMY     BREAST CYST ASPIRATION  11/03/2020   FOOT SURGERY     OOPHORECTOMY     Rt.ovary removed with Hyst   RETINAL DETACHMENT SURGERY     TONSILLECTOMY     Family History  Problem Relation Age of Onset   Heart disease Other    Breast cancer Other    Breast cancer Mother        metastatic--to bone   CAD Father    Cancer Maternal Grandmother        unknown   Cancer Paternal Grandmother        colon   Allergies as of 12/26/2022   No Known Allergies      Medication List        Accurate as of December 26, 2022  9:39 AM. If you have any questions, ask your nurse or doctor.  albuterol 108 (90 Base) MCG/ACT inhaler Commonly known as: VENTOLIN HFA Inhale 2 puffs into the lungs every 6 (six) hours as needed for wheezing or shortness of breath.   aspirin 81 MG tablet Take 81 mg by mouth daily.   atorvastatin 40 MG tablet Commonly known as: LIPITOR Take 1 tablet (40 mg total) by mouth daily.   escitalopram 5 MG tablet Commonly known as: Lexapro Take 1 tablet (5 mg total) by mouth daily.   fluocinonide cream 0.05 % Commonly known as: LIDEX Apply 1 Application topically 2 (two) times daily.   gabapentin 300 MG capsule Commonly known as: NEURONTIN Take 1 capsule (300 mg total) by mouth at bedtime.   metFORMIN 500 MG tablet Commonly known as: GLUCOPHAGE 500 mg (one tablet) in the morning and 1,000 mg (2 tablets) at night   metoprolol succinate 25 MG 24 hr tablet Commonly known as: TOPROL-XL Take 1 tablet (25 mg total) by mouth daily.   multivitamin tablet Take 1 tablet by mouth daily.   nitrofurantoin (macrocrystal-monohydrate) 100 MG capsule Commonly known as: Macrobid Take 1 capsule (100 mg total) by mouth 2 (two) times daily.   Oyster Shell Calcium/D 250-125 MG-UNIT Tabs Take by mouth.   pantoprazole 40 MG  tablet Commonly known as: PROTONIX Take 1 tablet (40 mg total) by mouth daily.        All past medical history, surgical history, allergies, family history, immunizations andmedications were updated in the EMR today and reviewed under the history and medication portions of their EMR.     ROS Negative, with the exception of above mentioned in HPI   Objective:  BP 107/66   Pulse 70   Temp 98.1 F (36.7 C)   Wt 123 lb 9.6 oz (56.1 kg)   SpO2 97%   BMI 21.89 kg/m  Body mass index is 21.89 kg/m. Physical Exam Vitals and nursing note reviewed.  Constitutional:      General: She is not in acute distress.    Appearance: Normal appearance. She is normal weight. She is not ill-appearing or toxic-appearing.  HENT:     Head: Normocephalic and atraumatic.     Mouth/Throat:     Mouth: Mucous membranes are moist.     Pharynx: No oropharyngeal exudate or posterior oropharyngeal erythema.     Comments: No mouth or tongue lesion present. No thrush.   Eyes:     General: No scleral icterus.       Right eye: No discharge.        Left eye: No discharge.     Extraocular Movements: Extraocular movements intact.     Conjunctiva/sclera: Conjunctivae normal.     Pupils: Pupils are equal, round, and reactive to light.  Skin:    Findings: No rash.  Neurological:     Mental Status: She is alert and oriented to person, place, and time. Mental status is at baseline.     Motor: No weakness.     Coordination: Coordination normal.     Gait: Gait normal.  Psychiatric:        Mood and Affect: Mood normal.        Behavior: Behavior normal.        Thought Content: Thought content normal.        Judgment: Judgment normal.      No results found. No results found. No results found for this or any previous visit (from the past 24 hour(s)).  Assessment/Plan: [name] is a 88 y.o. female present for  OV for  1. Burning tongue syndrome We discussed her burning tongue syndrome today. This  has been present since 2017 intermittently. She has had biopsies at this location in the past and reports have been benign (per pt).  Encouraged her to take a b-complex and zinc supplements.  Avoid triggers.  Try biotene lozenges, especially before meals.  F/u prn   Reviewed expectations re: course of current medical issues. Discussed self-management of symptoms. Outlined signs and symptoms indicating need for more acute intervention. Patient verbalized understanding and all questions were answered. Patient received an After-Visit Summary.    No orders of the defined types were placed in this encounter.  No orders of the defined types were placed in this encounter.  Referral Orders  No referral(s) requested today     Note is dictated utilizing voice recognition software. Although note has been proof read prior to signing, occasional typographical errors still can be missed. If any questions arise, please do not hesitate to call for verification.   electronically signed by:  Felix Pacini, DO  [facility] [place]

## 2022-12-26 NOTE — Patient Instructions (Addendum)
Return if symptoms worsen or fail to improve.  Start a B-complex supplement and a zinc 30 mg every other day.  Both of these can help with burning tongue syndrome.  Avoid triggers.        Great to see you today.  I have refilled the medication(s) we provide.   If labs were collected, we will inform you of lab results once received either by echart message or telephone call.   - echart message- for normal results that have been seen by the patient already.   - telephone call: abnormal results or if patient has not viewed results in their echart.

## 2023-01-18 IMAGING — MG MM DIGITAL DIAGNOSTIC UNILAT*L* W/ TOMO W/ CAD
4 series · 4 of 12 positions shown · non-contrast
Comparison: Previous exam(s).

CLINICAL DATA: Recall from screening mammography with
tomosynthesis, possible mass involving the UPPER INNER QUADRANT of
the LEFT breast at MIDDLE depth.

EXAM:
DIGITAL DIAGNOSTIC UNILATERAL LEFT MAMMOGRAM WITH TOMOSYNTHESIS AND
CAD; ULTRASOUND LEFT BREAST LIMITED
TECHNIQUE: Left digital diagnostic mammography and breast tomosynthesis was
performed. The images were evaluated with computer-aided detection.;
Targeted ultrasound examination of the left breast was performed

[L CC synth-2D]
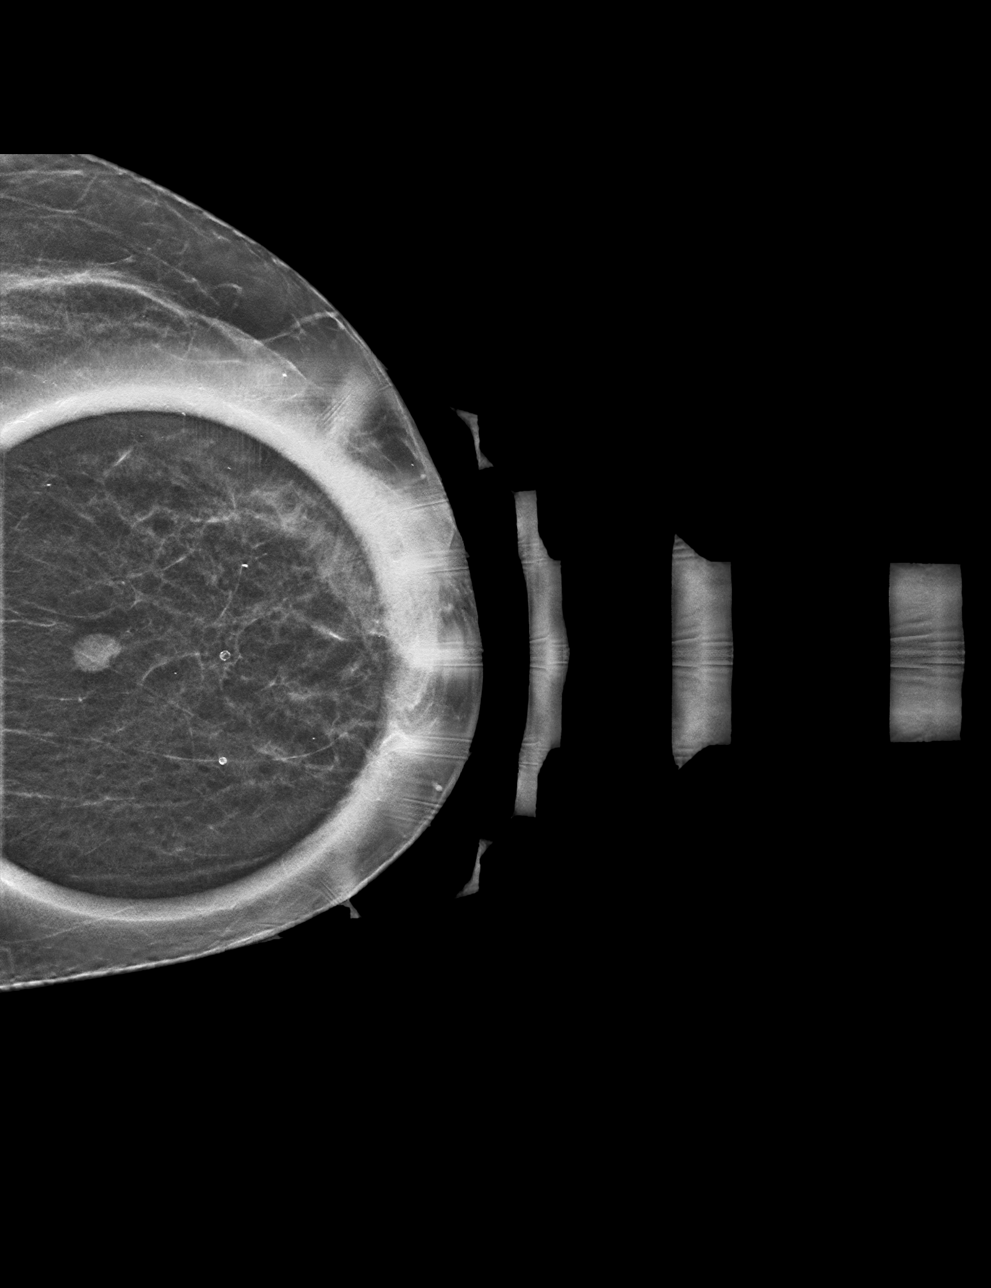

[L MLO synth-2D]
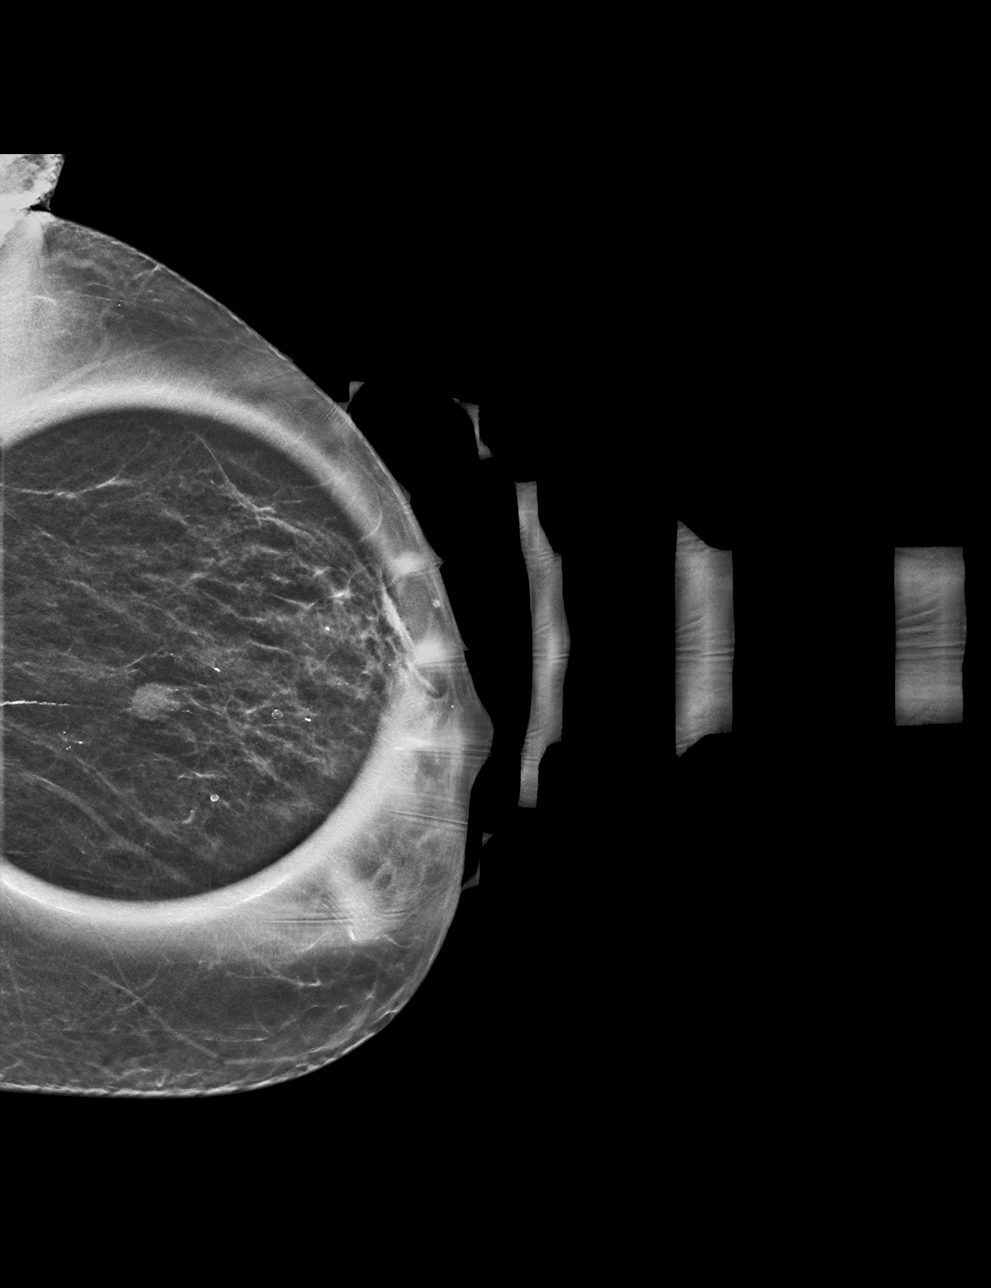

[L CC tomo · tomo slice 19/38.0]
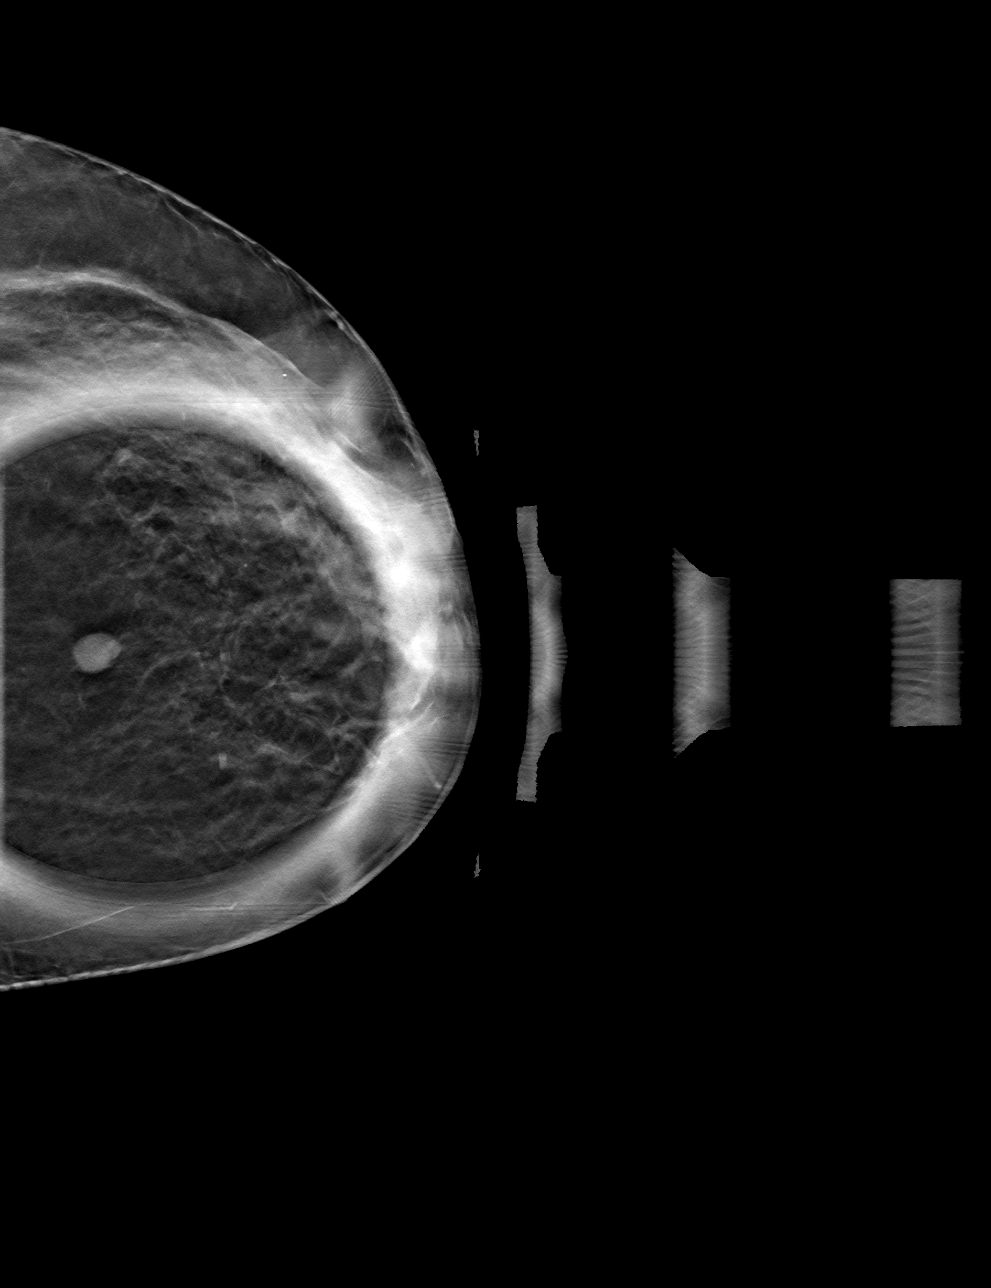

[L MLO tomo · tomo slice 21/40.0]
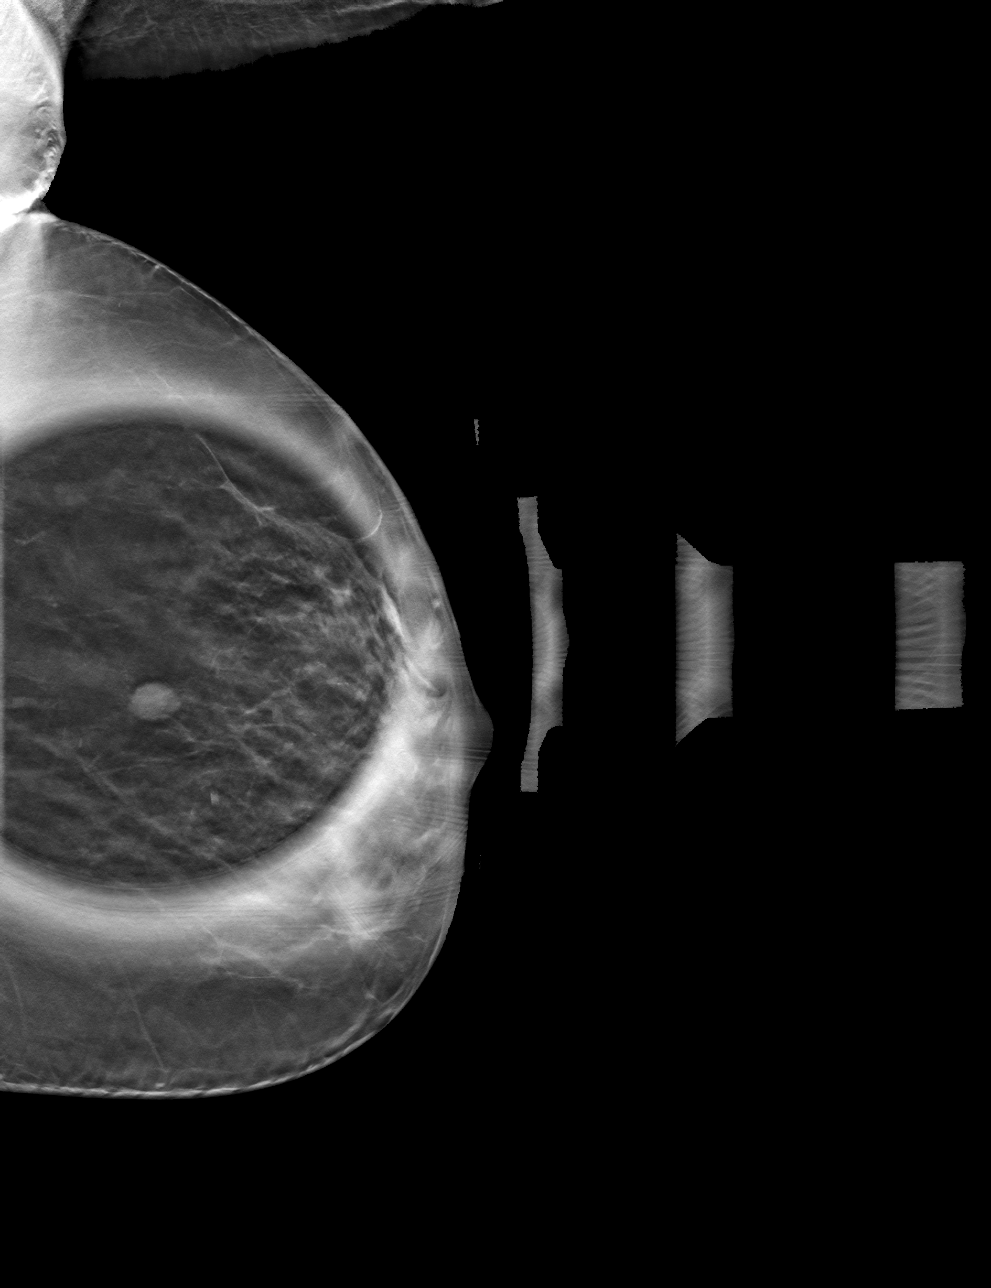

[4 of 12 positions shown; findings below may reference images not displayed]

ACR Breast Density Category b: There are scattered areas of
fibroglandular density.
FINDINGS: Spot-compression CC and MLO views of the area of concern obtained.

Spot compression images confirm an isodense mass with somewhat
lobular margins in the UPPER INNER QUADRANT measuring 8-9 mm. There
is no associated architectural distortion or suspicious
calcifications.

Targeted ultrasound is performed, demonstrating an anechoic mass
with a solitary lobulation at the 11:30 o'clock position
approximately 4 cm from nipple at middle depth, measuring
approximately 8 x 6 x 6 mm, demonstrating slight posterior acoustic
enhancement and no internal power Doppler flow, corresponding to the
screening mammographic finding.

Sonographic evaluation of the LEFT axilla demonstrates no pathologic
lymphadenopathy.
IMPRESSION: 1. Likely benign 8 mm cyst in the UPPER INNER QUADRANT of the LEFT
breast which accounts for the screening mammographic finding.
2. No pathologic LEFT axillary lymphadenopathy.

RECOMMENDATION:
Ultrasound-guided aspiration of the likely benign cyst to confirm
benignity. If the mass does not completely aspirate, then core
needle biopsy could be performed at that time.

I have discussed the findings and recommendations with the patient.
If applicable, a reminder letter will be sent to the patient
regarding the next appointment.

BI-RADS CATEGORY  3: Probably benign.

## 2023-01-23 ENCOUNTER — Telehealth: Payer: Self-pay | Admitting: Family Medicine

## 2023-01-23 NOTE — Telephone Encounter (Signed)
Patient called and reports she fell on Thursday, and did not seek medical attention. She fell and hurt her toe and knee. She declined appointment at this time. She was requesting a muscle relaxer because she feels like she needs that to help with pain when walking. Please give the patient a call to discuss.

## 2023-01-23 NOTE — Telephone Encounter (Signed)
Spoke with patient regarding results/recommendations.  

## 2023-01-24 ENCOUNTER — Encounter: Payer: Self-pay | Admitting: Family Medicine

## 2023-01-24 ENCOUNTER — Ambulatory Visit (INDEPENDENT_AMBULATORY_CARE_PROVIDER_SITE_OTHER): Payer: No Typology Code available for payment source | Admitting: Family Medicine

## 2023-01-24 VITALS — BP 123/79 | HR 83 | Temp 98.1°F | Wt 123.0 lb

## 2023-01-24 DIAGNOSIS — M5442 Lumbago with sciatica, left side: Secondary | ICD-10-CM | POA: Diagnosis not present

## 2023-01-24 MED ORDER — METHYLPREDNISOLONE ACETATE 80 MG/ML IJ SUSP
80.0000 mg | Freq: Once | INTRAMUSCULAR | Status: AC
  Administered 2023-01-24: 80 mg via INTRAMUSCULAR

## 2023-01-24 MED ORDER — TRAMADOL HCL 50 MG PO TABS: 50.0000 mg | ORAL_TABLET | Freq: Three times a day (TID) | ORAL | 0 refills | Status: DC | PRN

## 2023-01-24 MED ORDER — PREDNISONE 20 MG PO TABS: ORAL_TABLET | ORAL | 0 refills | Status: DC

## 2023-01-24 MED ORDER — TIZANIDINE HCL 4 MG PO TABS: 4.0000 mg | ORAL_TABLET | Freq: Two times a day (BID) | ORAL | 0 refills | Status: DC | PRN

## 2023-01-24 NOTE — Addendum Note (Signed)
Addended by: Filomena Jungling on: 01/24/2023 01:35 PM   Modules accepted: Orders

## 2023-01-24 NOTE — Patient Instructions (Addendum)
Tramadol for pain, as needed every 8 hours for 5 days.  zanaflex is muscle relaxer- as needed once in the day, but take every night for at least 7 days.   Steroid shot provided today. Start prednisone tomorrow for additional 8 days.

## 2023-01-24 NOTE — Progress Notes (Signed)
Melissa [name] , 04-10-1935, 88 y.o., female MRN: [id number] Patient Care Team    Relationship Specialty Notifications Start End  Natalia Leatherwood, DO PCP - General Family Medicine  07/03/18   Rankin, Alford Highland, MD Consulting Physician Ophthalmology  07/03/18   Allie Bossier, MD Consulting Physician Obstetrics and Gynecology  07/04/18   Lennette Bihari, MD Consulting Physician Cardiology  07/04/18   Patton Salles, MD Consulting Physician Obstetrics and Gynecology  02/18/20   [facility], [facility]    12/01/21     Chief Complaint  Patient presents with   Genia Hotter forward on left knee but is having left back pain now.     Subjective: Melissa [name] is a 88 y.o. Pt presents for an OV with complaints of lower back pain.  She states she fell on her left knee over the weekend.  She states she was going up her basement steps and missed a step.  She fell on her left knee, but states her knee is not painful.  The left side of her buttock has pain with radiation down the back of her left leg to her ankle. Patient has a history of lumbar degenerative disc disease, retrolisthesis present in 2019.  Patient has never had any back surgeries completed. Pt has tried NSAIDs, heat, ice to ease their symptoms.  Patient reports symptoms are worse when reaching up and walking.  She states that she is sitting still it is not as painful.   Lumbar x-ray 04/2018: FINDINGS: There is little change in alignment of the lumbar vertebrae with retrolisthesis of L2 on L3 by approximately 7-8 mm. There is degenerative disc disease at T12-L1, L1-2, and L2-3 levels. No compression deformity is seen. Moderate abdominal aortic atherosclerosis is noted. The SI joints appear corticated. IMPRESSION: 1. No change in alignment with retrolisthesis of L2 on L3 and degenerative disc disease at T12-L1, L1-2, and L2-3 levels. 2. Moderate abdominal aortic atherosclerosis     12/26/2022    9:33 AM  10/26/2022    1:32 PM 06/29/2022    1:53 PM 12/01/2021   10:07 AM 09/15/2021    9:37 AM  Depression screen PHQ 2/9  Decreased Interest 0 0 0 0 0  Down, Depressed, Hopeless 0 0 0 1 0  PHQ - 2 Score 0 0 0 1 0  Altered sleeping    3   Tired, decreased energy    2   Change in appetite    0   Feeling bad or failure about yourself     0   Trouble concentrating    0   Moving slowly or fidgety/restless    0   Suicidal thoughts    0   PHQ-9 Score    6     No Known Allergies Social History   Social History Narrative   Marital status/children/pets: Widowed.   Education/employment: 12th grade education.  Retired.   Safety:      -smoke alarm in the home:Yes     - wears seatbelt: Yes     - Feels safe in their relationships: Yes   Past Medical History:  Diagnosis Date   Burning tongue syndrome 11/11/2015   Colon polyps    Cystocele with prolapse 2019   Dr. Marice Potter- Monitor for now.    Detached retina, right    Diabetes mellitus    Diaphragmatic hernia    GERD (gastroesophageal reflux disease)  Hyperlipidemia    Insomnia    Macular degeneration    Past Surgical History:  Procedure Laterality Date   ABDOMINAL HYSTERECTOMY     BREAST CYST ASPIRATION  11/03/2020   FOOT SURGERY     OOPHORECTOMY     Rt.ovary removed with Hyst   RETINAL DETACHMENT SURGERY     TONSILLECTOMY     Family History  Problem Relation Age of Onset   Heart disease Other    Breast cancer Other    Breast cancer Mother        metastatic--to bone   CAD Father    Cancer Maternal Grandmother        unknown   Cancer Paternal Grandmother        colon   Allergies as of 01/24/2023   No Known Allergies      Medication List        Accurate as of January 24, 2023 12:39 PM. If you have any questions, ask your nurse or doctor.          STOP taking these medications    nitrofurantoin (macrocrystal-monohydrate) 100 MG capsule Commonly known as: Macrobid Stopped by: Felix Pacini       TAKE these  medications    albuterol 108 (90 Base) MCG/ACT inhaler Commonly known as: VENTOLIN HFA Inhale 2 puffs into the lungs every 6 (six) hours as needed for wheezing or shortness of breath.   aspirin 81 MG tablet Take 81 mg by mouth daily.   atorvastatin 40 MG tablet Commonly known as: LIPITOR Take 1 tablet (40 mg total) by mouth daily.   escitalopram 5 MG tablet Commonly known as: Lexapro Take 1 tablet (5 mg total) by mouth daily.   fluocinonide cream 0.05 % Commonly known as: LIDEX Apply 1 Application topically 2 (two) times daily.   gabapentin 300 MG capsule Commonly known as: NEURONTIN Take 1 capsule (300 mg total) by mouth at bedtime.   metFORMIN 500 MG tablet Commonly known as: GLUCOPHAGE 500 mg (one tablet) in the morning and 1,000 mg (2 tablets) at night   metoprolol succinate 25 MG 24 hr tablet Commonly known as: TOPROL-XL Take 1 tablet (25 mg total) by mouth daily.   multivitamin tablet Take 1 tablet by mouth daily.   Oyster Shell Calcium/D 250-125 MG-UNIT Tabs Take by mouth.   pantoprazole 40 MG tablet Commonly known as: PROTONIX Take 1 tablet (40 mg total) by mouth daily.   predniSONE 20 MG tablet Commonly known as: DELTASONE 60 mg x1d, 40 mg x3d, 20 mg x2d, 10 mg x2d Start taking on: January 25, 2023 Started by: Felix Pacini   tiZANidine 4 MG tablet Commonly known as: Zanaflex Take 1 tablet (4 mg total) by mouth 2 (two) times daily as needed for muscle spasms. Started by: Felix Pacini   traMADol 50 MG tablet Commonly known as: ULTRAM Take 1 tablet (50 mg total) by mouth every 8 (eight) hours as needed for up to 5 days. Started by: Felix Pacini        All past medical history, surgical history, allergies, family history, immunizations andmedications were updated in the EMR today and reviewed under the history and medication portions of their EMR.     ROS Negative, with the exception of above mentioned in HPI   Objective:  BP 123/79   Pulse 83    Temp 98.1 F (36.7 C)   Wt 123 lb (55.8 kg)   SpO2 96%   BMI 21.79 kg/m  Body mass index  is 21.79 kg/m.  Physical Exam Vitals and nursing note reviewed.  Constitutional:      General: She is not in acute distress.    Appearance: Normal appearance. She is normal weight. She is not ill-appearing or toxic-appearing.  HENT:     Head: Normocephalic and atraumatic.  Eyes:     General: No scleral icterus.       Right eye: No discharge.        Left eye: No discharge.     Extraocular Movements: Extraocular movements intact.     Conjunctiva/sclera: Conjunctivae normal.     Pupils: Pupils are equal, round, and reactive to light.  Musculoskeletal:        General: No swelling or deformity.     Lumbar back: Spasms present. No swelling, deformity, tenderness or bony tenderness. Decreased range of motion. Negative right straight leg raise test and negative left straight leg raise test. Scoliosis present.     Comments: Thoracic kyphosis present.  No tenderness to palpation SI joint.  Discomfort to palpation over piriformis.  Skin:    Findings: No rash.  Neurological:     Mental Status: She is alert and oriented to person, place, and time. Mental status is at baseline.     Motor: No weakness.     Coordination: Coordination normal.     Gait: Gait normal.  Psychiatric:        Mood and Affect: Mood normal.        Behavior: Behavior normal.        Thought Content: Thought content normal.        Judgment: Judgment normal.      No results found. No results found. No results found for this or any previous visit (from the past 24 hour(s)).  Assessment/Plan: Melissa [name] is a 88 y.o. female present for OV for  Acute back pain with sciatica, left Rest.  Heat application. Can continue NSAIDs if needed. Tramadol 50 mg 3 times daily as needed prescribed if needed for severe pain. Zanaflex 4 mg twice daily as needed prescribed.  She has tolerated this medication in the past. IM Depo-Medrol  80 mg injection provided today. Additional prednisone 8-day taper to start tomorrow.  Return in about 2 weeks (around 02/07/2023), or if symptoms worsen or fail to improve, for back pain.  Reviewed expectations re: course of current medical issues. Discussed self-management of symptoms. Outlined signs and symptoms indicating need for more acute intervention. Patient verbalized understanding and all questions were answered. Patient received an After-Visit Summary.    No orders of the defined types were placed in this encounter.  Meds ordered this encounter  Medications   predniSONE (DELTASONE) 20 MG tablet    Sig: 60 mg x1d, 40 mg x3d, 20 mg x2d, 10 mg x2d    Dispense:  12 tablet    Refill:  0   tiZANidine (ZANAFLEX) 4 MG tablet    Sig: Take 1 tablet (4 mg total) by mouth 2 (two) times daily as needed for muscle spasms.    Dispense:  60 tablet    Refill:  0   traMADol (ULTRAM) 50 MG tablet    Sig: Take 1 tablet (50 mg total) by mouth every 8 (eight) hours as needed for up to 5 days.    Dispense:  15 tablet    Refill:  0   Referral Orders  No referral(s) requested today     Note is dictated utilizing voice recognition software. Although note has been proof  read prior to signing, occasional typographical errors still can be missed. If any questions arise, please do not hesitate to call for verification.   electronically signed by:  Felix Pacini, DO  [facility] [place]

## 2023-01-27 ENCOUNTER — Ambulatory Visit (INDEPENDENT_AMBULATORY_CARE_PROVIDER_SITE_OTHER): Payer: No Typology Code available for payment source | Admitting: Family Medicine

## 2023-01-27 ENCOUNTER — Encounter: Payer: Self-pay | Admitting: Family Medicine

## 2023-01-27 VITALS — BP 128/74 | HR 83 | Temp 97.9°F | Wt 126.0 lb

## 2023-01-27 DIAGNOSIS — M4310 Spondylolisthesis, site unspecified: Secondary | ICD-10-CM | POA: Diagnosis not present

## 2023-01-27 DIAGNOSIS — M5442 Lumbago with sciatica, left side: Secondary | ICD-10-CM | POA: Diagnosis not present

## 2023-01-27 DIAGNOSIS — M51369 Other intervertebral disc degeneration, lumbar region without mention of lumbar back pain or lower extremity pain: Secondary | ICD-10-CM | POA: Insufficient documentation

## 2023-01-27 DIAGNOSIS — M5136 Other intervertebral disc degeneration, lumbar region: Secondary | ICD-10-CM | POA: Diagnosis not present

## 2023-01-27 MED ORDER — TRAMADOL HCL 50 MG PO TABS: 50.0000 mg | ORAL_TABLET | Freq: Three times a day (TID) | ORAL | 0 refills | Status: AC | PRN

## 2023-01-27 NOTE — Patient Instructions (Signed)
60 mg x1d, 40 mg x2d, 20 mg x1d, 10 mg x2d

## 2023-01-27 NOTE — Progress Notes (Signed)
Melissa [name] , September 16, 1934, 88 y.o., female MRN: [id number] Patient Care Team    Relationship Specialty Notifications Start End  Natalia Leatherwood, DO PCP - General Family Medicine  07/03/18   Rankin, Alford Highland, MD Consulting Physician Ophthalmology  07/03/18   Allie Bossier, MD Consulting Physician Obstetrics and Gynecology  07/04/18   Lennette Bihari, MD Consulting Physician Cardiology  07/04/18   Patton Salles, MD Consulting Physician Obstetrics and Gynecology  02/18/20   Ponciano Ort, [facility]    12/01/21     Chief Complaint  Patient presents with   Back Pain    Left lower back     Subjective: Melissa [name] is a 88 y.o. Pt presents for an OV with complaints of lower back pain.  Patient was seen 3 days ago for this concern (see full note below).  She was provided with IM Depo-Medrol 80 mg injection, prednisone taper, muscle relaxant, tramadol 3 times daily.  She returns today and states she has some mild improvement but is still in a great deal of pain. Hurts to walk.  Prior note: states she fell on her left knee over the weekend.  She states she was going up her basement steps and missed a step.  She fell on her left knee, but states her knee is not painful.  The left side of her buttock has pain with radiation down the back of her left leg to her ankle. Patient has a history of lumbar degenerative disc disease, retrolisthesis present in 2019.  Patient has never had any back surgeries completed. Pt has tried NSAIDs, heat, ice to ease their symptoms.  Patient reports symptoms are worse when reaching up and walking.  She states that she is sitting still it is not as painful.   Lumbar x-ray 04/2018: FINDINGS: There is little change in alignment of the lumbar vertebrae with retrolisthesis of L2 on L3 by approximately 7-8 mm. There is degenerative disc disease at T12-L1, L1-2, and L2-3 levels. No compression deformity is seen. Moderate abdominal  aortic atherosclerosis is noted. The SI joints appear corticated. IMPRESSION: 1. No change in alignment with retrolisthesis of L2 on L3 and degenerative disc disease at T12-L1, L1-2, and L2-3 levels. 2. Moderate abdominal aortic atherosclerosis     12/26/2022    9:33 AM 10/26/2022    1:32 PM 06/29/2022    1:53 PM 12/01/2021   10:07 AM 09/15/2021    9:37 AM  Depression screen PHQ 2/9  Decreased Interest 0 0 0 0 0  Down, Depressed, Hopeless 0 0 0 1 0  PHQ - 2 Score 0 0 0 1 0  Altered sleeping    3   Tired, decreased energy    2   Change in appetite    0   Feeling bad or failure about yourself     0   Trouble concentrating    0   Moving slowly or fidgety/restless    0   Suicidal thoughts    0   PHQ-9 Score    6     No Known Allergies Social History   Social History Narrative   Marital status/children/pets: Widowed.   Education/employment: 12th grade education.  Retired.   Safety:      -smoke alarm in the home:Yes     - wears seatbelt: Yes     - Feels safe in their relationships: Yes   Past Medical History:  Diagnosis  Date   Burning tongue syndrome 11/11/2015   Colon polyps    Cystocele with prolapse 2019   Dr. Marice Potter- Monitor for now.    Detached retina, right    Diabetes mellitus    Diaphragmatic hernia    GERD (gastroesophageal reflux disease)    Hyperlipidemia    Insomnia    Macular degeneration    Past Surgical History:  Procedure Laterality Date   ABDOMINAL HYSTERECTOMY     BREAST CYST ASPIRATION  11/03/2020   FOOT SURGERY     OOPHORECTOMY     Rt.ovary removed with Hyst   RETINAL DETACHMENT SURGERY     TONSILLECTOMY     Family History  Problem Relation Age of Onset   Heart disease Other    Breast cancer Other    Breast cancer Mother        metastatic--to bone   CAD Father    Cancer Maternal Grandmother        unknown   Cancer Paternal Grandmother        colon   Allergies as of 01/27/2023   No Known Allergies      Medication List         Accurate as of January 27, 2023  1:44 PM. If you have any questions, ask your nurse or doctor.          albuterol 108 (90 Base) MCG/ACT inhaler Commonly known as: VENTOLIN HFA Inhale 2 puffs into the lungs every 6 (six) hours as needed for wheezing or shortness of breath.   aspirin 81 MG tablet Take 81 mg by mouth daily.   atorvastatin 40 MG tablet Commonly known as: LIPITOR Take 1 tablet (40 mg total) by mouth daily.   escitalopram 5 MG tablet Commonly known as: Lexapro Take 1 tablet (5 mg total) by mouth daily.   fluocinonide cream 0.05 % Commonly known as: LIDEX Apply 1 Application topically 2 (two) times daily.   gabapentin 300 MG capsule Commonly known as: NEURONTIN Take 1 capsule (300 mg total) by mouth at bedtime.   metFORMIN 500 MG tablet Commonly known as: GLUCOPHAGE 500 mg (one tablet) in the morning and 1,000 mg (2 tablets) at night   metoprolol succinate 25 MG 24 hr tablet Commonly known as: TOPROL-XL Take 1 tablet (25 mg total) by mouth daily.   multivitamin tablet Take 1 tablet by mouth daily.   Oyster Shell Calcium/D 250-125 MG-UNIT Tabs Take by mouth.   pantoprazole 40 MG tablet Commonly known as: PROTONIX Take 1 tablet (40 mg total) by mouth daily.   predniSONE 20 MG tablet Commonly known as: DELTASONE 60 mg x1d, 40 mg x3d, 20 mg x2d, 10 mg x2d   tiZANidine 4 MG tablet Commonly known as: Zanaflex Take 1 tablet (4 mg total) by mouth 2 (two) times daily as needed for muscle spasms.   traMADol 50 MG tablet Commonly known as: ULTRAM Take 1 tablet (50 mg total) by mouth every 8 (eight) hours as needed for up to 5 days.        All past medical history, surgical history, allergies, family history, immunizations andmedications were updated in the EMR today and reviewed under the history and medication portions of their EMR.     ROS Negative, with the exception of above mentioned in HPI   Objective:  BP 128/74   Pulse 83   Temp 97.9 F  (36.6 C)   Wt 126 lb (57.2 kg)   SpO2 93%   BMI 22.32 kg/m  Body  mass index is 22.32 kg/m.  Physical Exam Vitals and nursing note reviewed.  Constitutional:      General: She is not in acute distress.    Appearance: Normal appearance. She is normal weight. She is not ill-appearing or toxic-appearing.  HENT:     Head: Normocephalic and atraumatic.  Eyes:     General: No scleral icterus.       Right eye: No discharge.        Left eye: No discharge.     Extraocular Movements: Extraocular movements intact.     Conjunctiva/sclera: Conjunctivae normal.     Pupils: Pupils are equal, round, and reactive to light.  Musculoskeletal:        General: No swelling or deformity.     Lumbar back: Spasms present. No swelling, deformity, tenderness or bony tenderness. Decreased range of motion. Negative right straight leg raise test and negative left straight leg raise test. Scoliosis present.     Comments: Thoracic kyphosis present.  No tenderness to palpation SI joint.  Discomfort to palpation over piriformis.  Skin:    Findings: No rash.  Neurological:     Mental Status: She is alert and oriented to person, place, and time. Mental status is at baseline.     Motor: No weakness.     Coordination: Coordination normal.     Gait: Gait normal.  Psychiatric:        Mood and Affect: Mood normal.        Behavior: Behavior normal.        Thought Content: Thought content normal.        Judgment: Judgment normal.      No results found. No results found. No results found for this or any previous visit (from the past 24 hour(s)).  Assessment/Plan: Melissa [name] is a 88 y.o. female present for OV for  Acute back pain with sciatica, left Continue Rest.  Heat application. Start stretches provided.  Can continue NSAIDs if needed. Continue Tramadol 25-50 mg 3 times daily as needed prescribed if needed for severe pain. Zanaflex 2-4 mg twice daily as needed prescribed.  She has tolerated this  medication in the past. She was not taking the prednisone as a taper dose. Provided her with new instructions on tapering prednisone.  PT referral placed.   Return in about 2 weeks (around 02/10/2023), or if symptoms worsen or fail to improve.  Reviewed expectations re: course of current medical issues. Discussed self-management of symptoms. Outlined signs and symptoms indicating need for more acute intervention. Patient verbalized understanding and all questions were answered. Patient received an After-Visit Summary.    Orders Placed This Encounter  Procedures   Ambulatory referral to Physical Therapy   Meds ordered this encounter  Medications   traMADol (ULTRAM) 50 MG tablet    Sig: Take 1 tablet (50 mg total) by mouth every 8 (eight) hours as needed for up to 5 days.    Dispense:  15 tablet    Refill:  0   Referral Orders         Ambulatory referral to Physical Therapy       Note is dictated utilizing voice recognition software. Although note has been proof read prior to signing, occasional typographical errors still can be missed. If any questions arise, please do not hesitate to call for verification.   electronically signed by:  Felix Pacini, DO  [facility] [place]

## 2023-01-31 ENCOUNTER — Telehealth: Payer: Self-pay | Admitting: Family Medicine

## 2023-01-31 NOTE — Telephone Encounter (Signed)
Patient called back with the name of the place she would like to be referred to now regarding PT. I did not take the original call, but she states she wants to now go to [facility] health outpatient in [place]. The phone number is [phone]. She would like to go there now because it is closer to her home.

## 2023-01-31 NOTE — Telephone Encounter (Signed)
Routed referral to PCP for location change approval

## 2023-02-16 ENCOUNTER — Ambulatory Visit (INDEPENDENT_AMBULATORY_CARE_PROVIDER_SITE_OTHER): Payer: No Typology Code available for payment source | Admitting: Family Medicine

## 2023-02-16 ENCOUNTER — Ambulatory Visit (INDEPENDENT_AMBULATORY_CARE_PROVIDER_SITE_OTHER): Payer: No Typology Code available for payment source

## 2023-02-16 VITALS — BP 104/62 | HR 110 | Ht 63.0 in | Wt 118.0 lb

## 2023-02-16 DIAGNOSIS — Z043 Encounter for examination and observation following other accident: Secondary | ICD-10-CM | POA: Diagnosis not present

## 2023-02-16 DIAGNOSIS — M160 Bilateral primary osteoarthritis of hip: Secondary | ICD-10-CM | POA: Diagnosis not present

## 2023-02-16 DIAGNOSIS — M25552 Pain in left hip: Secondary | ICD-10-CM | POA: Diagnosis not present

## 2023-02-16 DIAGNOSIS — M5442 Lumbago with sciatica, left side: Secondary | ICD-10-CM | POA: Diagnosis not present

## 2023-02-16 DIAGNOSIS — G8929 Other chronic pain: Secondary | ICD-10-CM

## 2023-02-16 DIAGNOSIS — M5126 Other intervertebral disc displacement, lumbar region: Secondary | ICD-10-CM | POA: Diagnosis not present

## 2023-02-16 DIAGNOSIS — M5136 Other intervertebral disc degeneration, lumbar region: Secondary | ICD-10-CM | POA: Diagnosis not present

## 2023-02-16 DIAGNOSIS — M47816 Spondylosis without myelopathy or radiculopathy, lumbar region: Secondary | ICD-10-CM | POA: Diagnosis not present

## 2023-02-16 DIAGNOSIS — M4187 Other forms of scoliosis, lumbosacral region: Secondary | ICD-10-CM | POA: Diagnosis not present

## 2023-02-16 MED ORDER — PREDNISONE 20 MG PO TABS: ORAL_TABLET | ORAL | 0 refills | Status: DC

## 2023-02-16 MED ORDER — GABAPENTIN 300 MG PO CAPS: 300.0000 mg | ORAL_CAPSULE | Freq: Every day | ORAL | 1 refills | Status: DC

## 2023-02-16 NOTE — Patient Instructions (Addendum)
Thank you for coming in today.   Please get an Xray today before you leave   I've referred you to Physical Therapy.  Let us know if you don't hear from them in one week.   Check back in 1 month

## 2023-02-16 NOTE — Progress Notes (Signed)
Melissa Payor, PhD, LAT, ATC acting as a scribe for Melissa Graham, MD.  Melissa [name] is a 88 y.o. female who presents to [name] today for L leg pain ongoing since 1st weekend in Aug. Pt suffered a fall when going up her basement steps. She missed a step, stubbed her toe, and fell on her L knee. Hx of lumbar DDD. Pt locates pain to L buttock w/ radiating pain along the posterior and slightly lateral aspect of her L leg.   Low back pain: no Radiating pain: yes LE numbness/tingling: yes LE weakness: no Aggravates: too acute Treatments tried: Depo-Medrol IM injection, prednisone, muscle relaxer, tramadol, Tylenol  Dx imaging: 05/01/18 L-spine XR  Pertinent review of systems: No fevers or chills  Relevant historical information: PAD.  Retinopathy.  Diabetes.   Exam:  BP 104/62   Pulse (!) 110   Ht 5\' 3"  (1.6 m)   Wt 118 lb (53.5 kg)   SpO2 97%   BMI 20.90 kg/m  General: Well Developed, well nourished, and in no acute distress.   MSK: L-spine nontender palpation spinal midline.  Decreased lumbar motion. Lower extremity strength is intact. Reflexes are intact.  Left hip normal appearing. Tender palpation greater trochanter.  Hip abduction strength intact.    Lab and Radiology Results  X-ray images lumbar spine and left hip obtained today personally and independently interpreted  L-spine: Multilevel DDD with mild scoliosis.  No acute fractures are visible.  Left hip: Mild DJD the left hip.  No acute fractures are visible in the pelvis  Await formal radiology review     Assessment and Plan: 88 y.o. female with pain radiating down the left leg in an left L5 or S1 dermatomal pattern.  This occurred after a fall.  She is already had about 3 weeks of conservative management with home exercise program prednisone and gabapentin which has not helped all that much.  Plan to add formal physical therapy in [place] where she lives.  Will  have prednisone prescribed again as a backup plan I refilled her gabapentin.  Recheck in a month.  If worsening or not improving consider MRI lumbar spine.   PDMP not reviewed this encounter. Orders Placed This Encounter  Procedures   DG Lumbar Spine 2-3 Views    Standing Status:   Future    Standing Expiration Date:   03/19/2023    Order Specific Question:   Reason for Exam (SYMPTOM  OR DIAGNOSIS REQUIRED)    Answer:   low back pain    Order Specific Question:   Preferred imaging location?    Answer:   [name]   DG HIP UNILAT W OR W/O PELVIS 2-3 VIEWS LEFT    Standing Status:   Future    Standing Expiration Date:   03/19/2023    Order Specific Question:   Reason for Exam (SYMPTOM  OR DIAGNOSIS REQUIRED)    Answer:   left hip pain    Order Specific Question:   Preferred imaging location?    Answer:   [name]   Ambulatory referral to Physical Therapy    Referral Priority:   Routine    Referral Type:   Physical Medicine    Referral Reason:   Specialty Services Required    Requested Specialty:   Physical Therapy    Number of Visits Requested:   1   Meds ordered this encounter  Medications   gabapentin (NEURONTIN) 300 MG capsule  Sig: Take 1 capsule (300 mg total) by mouth at bedtime.    Dispense:  90 capsule    Refill:  1   predniSONE (DELTASONE) 20 MG tablet    Sig: 60 mg x1d, 40 mg x3d, 20 mg x2d, 10 mg x2d    Dispense:  12 tablet    Refill:  0     Discussed warning signs or symptoms. Please see discharge instructions. Patient expresses understanding.   The above documentation has been reviewed and is accurate and complete Melissa [name], M.D.

## 2023-02-18 ENCOUNTER — Emergency Department (HOSPITAL_BASED_OUTPATIENT_CLINIC_OR_DEPARTMENT_OTHER): Payer: No Typology Code available for payment source | Admitting: Radiology

## 2023-02-18 ENCOUNTER — Emergency Department (HOSPITAL_BASED_OUTPATIENT_CLINIC_OR_DEPARTMENT_OTHER)
Admission: EM | Admit: 2023-02-18 | Discharge: 2023-02-18 | Disposition: A | Discharge status: Home / Self Care | Payer: No Typology Code available for payment source | Attending: Emergency Medicine | Admitting: Emergency Medicine

## 2023-02-18 ENCOUNTER — Encounter (HOSPITAL_BASED_OUTPATIENT_CLINIC_OR_DEPARTMENT_OTHER): Payer: Self-pay | Admitting: Emergency Medicine

## 2023-02-18 ENCOUNTER — Emergency Department (HOSPITAL_BASED_OUTPATIENT_CLINIC_OR_DEPARTMENT_OTHER): Payer: No Typology Code available for payment source

## 2023-02-18 ENCOUNTER — Other Ambulatory Visit: Payer: Self-pay

## 2023-02-18 DIAGNOSIS — M25512 Pain in left shoulder: Secondary | ICD-10-CM | POA: Diagnosis not present

## 2023-02-18 DIAGNOSIS — Z7982 Long term (current) use of aspirin: Secondary | ICD-10-CM | POA: Diagnosis not present

## 2023-02-18 DIAGNOSIS — I700 Atherosclerosis of aorta: Secondary | ICD-10-CM | POA: Diagnosis not present

## 2023-02-18 DIAGNOSIS — I672 Cerebral atherosclerosis: Secondary | ICD-10-CM | POA: Diagnosis not present

## 2023-02-18 DIAGNOSIS — S4992XA Unspecified injury of left shoulder and upper arm, initial encounter: Secondary | ICD-10-CM | POA: Diagnosis present

## 2023-02-18 DIAGNOSIS — Z7984 Long term (current) use of oral hypoglycemic drugs: Secondary | ICD-10-CM | POA: Diagnosis not present

## 2023-02-18 DIAGNOSIS — K449 Diaphragmatic hernia without obstruction or gangrene: Secondary | ICD-10-CM | POA: Diagnosis not present

## 2023-02-18 DIAGNOSIS — I6529 Occlusion and stenosis of unspecified carotid artery: Secondary | ICD-10-CM | POA: Diagnosis not present

## 2023-02-18 DIAGNOSIS — Z043 Encounter for examination and observation following other accident: Secondary | ICD-10-CM | POA: Diagnosis not present

## 2023-02-18 DIAGNOSIS — Z79899 Other long term (current) drug therapy: Secondary | ICD-10-CM | POA: Diagnosis not present

## 2023-02-18 DIAGNOSIS — R918 Other nonspecific abnormal finding of lung field: Secondary | ICD-10-CM | POA: Diagnosis not present

## 2023-02-18 DIAGNOSIS — M79622 Pain in left upper arm: Secondary | ICD-10-CM | POA: Diagnosis not present

## 2023-02-18 DIAGNOSIS — E119 Type 2 diabetes mellitus without complications: Secondary | ICD-10-CM | POA: Diagnosis not present

## 2023-02-18 DIAGNOSIS — W19XXXA Unspecified fall, initial encounter: Secondary | ICD-10-CM | POA: Insufficient documentation

## 2023-02-18 DIAGNOSIS — G319 Degenerative disease of nervous system, unspecified: Secondary | ICD-10-CM | POA: Diagnosis not present

## 2023-02-18 DIAGNOSIS — S42202A Unspecified fracture of upper end of left humerus, initial encounter for closed fracture: Secondary | ICD-10-CM | POA: Diagnosis not present

## 2023-02-18 DIAGNOSIS — S42292A Other displaced fracture of upper end of left humerus, initial encounter for closed fracture: Secondary | ICD-10-CM | POA: Insufficient documentation

## 2023-02-18 DIAGNOSIS — M79602 Pain in left arm: Secondary | ICD-10-CM | POA: Diagnosis not present

## 2023-02-18 MED ORDER — OXYCODONE HCL 5 MG PO TABS
5.0000 mg | ORAL_TABLET | Freq: Four times a day (QID) | ORAL | 0 refills | Status: DC | PRN
Start: 2023-02-18 — End: 2023-03-02

## 2023-02-18 MED ORDER — FENTANYL CITRATE PF 50 MCG/ML IJ SOSY
50.0000 ug | PREFILLED_SYRINGE | Freq: Once | INTRAMUSCULAR | Status: AC
  Administered 2023-02-18: 50 ug via INTRAVENOUS
  Filled 2023-02-18: qty 1

## 2023-02-18 NOTE — Discharge Instructions (Addendum)
Overall use sling for immobilization.  It is okay to remove the sling for shower but then put it back on afterwards.  I have prescribed you narcotic pain medicine called Roxicodone for pain.  Do not mix with alcohol, drugs or dangerous activities including driving as this medication is sedating.  If you are able to I recommend taking Tylenol and ibuprofen for pain as well.  You can ice this area as well.  Follow-up with Dr. Dion Saucier with orthopedics.  Please do not bear any weight with your left upper extremity.

## 2023-02-18 NOTE — ED Triage Notes (Signed)
Pt arrives pov, bib wheelchair, endorses mechanical fall pta. Pt denies head injury, reports LT arm pain, from shoulder to hand, concern for fx. Denies thinners. Currently taking tramadol for prior leg injury, last dose ~ 3 hrs pta

## 2023-02-18 NOTE — ED Provider Notes (Signed)
[facility] Provider Note   CSN: [id number] Arrival date & time: 02/18/23  1722     History  Chief Complaint  Patient presents with   Marletta Lor    Melissa [name] is a 88 y.o. female.  Patient with mechanical fall prior to arrival.  Pain mostly in the left shoulder area.  Melissa [name] does not think Melissa [name] hit Melissa [name] head or lost consciousness.  Melissa [name] not on blood thinners.  No pain in the lower extremities.  Was ambulatory afterwards.  Melissa [name] has a history of diabetes and high cholesterol.  Movement makes it worse.  Rest makes it better.  Denies any numbness tingling weakness.  No chest pain or shortness of breath.  The history is provided by the patient.       Home Medications Prior to Admission medications   Medication Sig Start Date End Date Taking? Authorizing Provider  oxyCODONE (ROXICODONE) 5 MG immediate release tablet Take 1 tablet (5 mg total) by mouth every 6 (six) hours as needed for up to 20 doses for breakthrough pain. 02/18/23  Yes Eleftheria Taborn, DO  albuterol (VENTOLIN HFA) 108 (90 Base) MCG/ACT inhaler Inhale 2 puffs into the lungs every 6 (six) hours as needed for wheezing or shortness of breath. 08/19/22   Kuneff, Renee A, DO  aspirin 81 MG tablet Take 81 mg by mouth daily.    [provider]  atorvastatin (LIPITOR) 40 MG tablet Take 1 tablet (40 mg total) by mouth daily. 11/09/22   Kuneff, Renee A, DO  Calcium Carbonate-Vitamin D (OYSTER SHELL CALCIUM/D) 250-125 MG-UNIT TABS Take by mouth.    [provider]  escitalopram (LEXAPRO) 5 MG tablet Take 1 tablet (5 mg total) by mouth daily. 11/09/22   Kuneff, Renee A, DO  fluocinonide cream (LIDEX) 0.05 % Apply 1 Application topically 2 (two) times daily. 05/17/22   Kuneff, Renee A, DO  gabapentin (NEURONTIN) 300 MG capsule Take 1 capsule (300 mg total) by mouth at bedtime. 02/16/23   Rodolph Bong, MD  metFORMIN (GLUCOPHAGE) 500 MG tablet 500 mg (one tablet) in the morning and 1,000 mg  (2 tablets) at night 11/09/22   Kuneff, Renee A, DO  metoprolol succinate (TOPROL-XL) 25 MG 24 hr tablet Take 1 tablet (25 mg total) by mouth daily. 11/09/22   Kuneff, Renee A, DO  Multiple Vitamin (MULTIVITAMIN) tablet Take 1 tablet by mouth daily.    [provider]  pantoprazole (PROTONIX) 40 MG tablet Take 1 tablet (40 mg total) by mouth daily. 11/09/22   Kuneff, Renee A, DO  predniSONE (DELTASONE) 20 MG tablet 60 mg x1d, 40 mg x3d, 20 mg x2d, 10 mg x2d 02/16/23   Rodolph Bong, MD  tiZANidine (ZANAFLEX) 4 MG tablet Take 1 tablet (4 mg total) by mouth 2 (two) times daily as needed for muscle spasms. Patient not taking: Reported on 02/16/2023 01/24/23   Felix Pacini A, DO      Allergies    Patient has no known allergies.    Review of Systems   Review of Systems  Physical Exam Updated Vital Signs BP 136/70 (BP Location: Right Arm)   Pulse 84   Temp 97.9 F (36.6 C)   Resp 18   SpO2 96%  Physical Exam Vitals and nursing note reviewed.  Constitutional:      General: Melissa [name] is not in acute distress.    Appearance: Melissa [name] is well-developed.  HENT:     Head: Normocephalic and atraumatic.  Mouth/Throat:     Mouth: Mucous membranes are moist.  Eyes:     Extraocular Movements: Extraocular movements intact.     Conjunctiva/sclera: Conjunctivae normal.     Pupils: Pupils are equal, round, and reactive to light.  Cardiovascular:     Rate and Rhythm: Normal rate and regular rhythm.     Pulses: Normal pulses.     Heart sounds: Normal heart sounds. No murmur heard. Pulmonary:     Effort: Pulmonary effort is normal. No respiratory distress.     Breath sounds: Normal breath sounds.  Abdominal:     General: Abdomen is flat.     Palpations: Abdomen is soft.     Tenderness: There is no abdominal tenderness.  Musculoskeletal:        General: Tenderness present. No swelling.     Cervical back: Normal range of motion and neck supple. No tenderness.     Comments: Tenderness to the left  proximal humerus, shoulder appears to be located on the left  Skin:    General: Skin is warm and dry.     Capillary Refill: Capillary refill takes less than 2 seconds.  Neurological:     General: No focal deficit present.     Mental Status: Melissa [name] is alert and oriented to person, place, and time.     Sensory: No sensory deficit.     Motor: No weakness.  Psychiatric:        Mood and Affect: Mood normal.     ED Results / Procedures / Treatments   Labs (all labs ordered are listed, but only abnormal results are displayed) Labs Reviewed - No data to display  EKG None  Radiology DG Chest 1 View  Result Date: 02/18/2023 CLINICAL DATA:  Fall EXAM: CHEST  1 VIEW COMPARISON:  06/29/2022 FINDINGS: Large hiatal. Heart is normal size. Aortic atherosclerosis. Left basilar scarring or atelectasis. Right lung clear. No effusions or acute bony abnormality. IMPRESSION: Left base scarring or atelectasis. Large hiatal hernia. No active cardiopulmonary disease. Electronically Signed   By: Charlett Nose M.D.   On: 02/18/2023 20:06   DG Humerus Left  Result Date: 02/18/2023 CLINICAL DATA:  Fall, left arm pain EXAM: LEFT HUMERUS - 2+ VIEW COMPARISON:  Left shoulder series today FINDINGS: There is a fracture through the left humeral neck extends superiorly to involve the greater tuberosity. Mild displacement. No subluxation or dislocation. No additional acute bony abnormality. IMPRESSION: Proximal left humeral fracture involving the humeral neck and greater tuberosity. Electronically Signed   By: Charlett Nose M.D.   On: 02/18/2023 20:05   CT Head Wo Contrast  Result Date: 02/18/2023 CLINICAL DATA:  Mechanical fall in 88 year old female. Left arm pain from shoulder to hand. EXAM: CT HEAD WITHOUT CONTRAST CT CERVICAL SPINE WITHOUT CONTRAST TECHNIQUE: Multidetector CT imaging of the head and cervical spine was performed following the standard protocol without intravenous contrast. Multiplanar CT image  reconstructions of the cervical spine were also generated. RADIATION DOSE REDUCTION: This exam was performed according to the departmental dose-optimization program which includes automated exposure control, adjustment of the mA and/or kV according to patient size and/or use of iterative reconstruction technique. COMPARISON:  None Available. FINDINGS: CT HEAD FINDINGS Brain: No intracranial hemorrhage, mass effect, or evidence of acute infarct. No hydrocephalus. No extra-axial fluid collection. Generalized cerebral atrophy. Ill-defined hypoattenuation within the cerebral white matter is nonspecific but consistent with chronic small vessel ischemic disease. Vascular: No hyperdense vessel. Intracranial arterial calcification. Skull: No fracture or focal lesion. Sinuses/Orbits:  No acute finding. Other: None. CT CERVICAL SPINE FINDINGS Alignment: No evidence of traumatic malalignment. Skull base and vertebrae: No acute fracture. No primary bone lesion or focal pathologic process. Soft tissues and spinal canal: No prevertebral fluid or swelling. No visible canal hematoma. Disc levels:  Age commensurate degenerative spondylosis. Upper chest: No acute abnormality. Other: Carotid calcification. IMPRESSION: 1. No acute intracranial abnormality. 2. No acute fracture in the cervical spine. Electronically Signed   By: Minerva Fester M.D.   On: 02/18/2023 20:03   CT Cervical Spine Wo Contrast  Result Date: 02/18/2023 CLINICAL DATA:  Mechanical fall in 88 year old female. Left arm pain from shoulder to hand. EXAM: CT HEAD WITHOUT CONTRAST CT CERVICAL SPINE WITHOUT CONTRAST TECHNIQUE: Multidetector CT imaging of the head and cervical spine was performed following the standard protocol without intravenous contrast. Multiplanar CT image reconstructions of the cervical spine were also generated. RADIATION DOSE REDUCTION: This exam was performed according to the departmental dose-optimization program which includes automated  exposure control, adjustment of the mA and/or kV according to patient size and/or use of iterative reconstruction technique. COMPARISON:  None Available. FINDINGS: CT HEAD FINDINGS Brain: No intracranial hemorrhage, mass effect, or evidence of acute infarct. No hydrocephalus. No extra-axial fluid collection. Generalized cerebral atrophy. Ill-defined hypoattenuation within the cerebral white matter is nonspecific but consistent with chronic small vessel ischemic disease. Vascular: No hyperdense vessel. Intracranial arterial calcification. Skull: No fracture or focal lesion. Sinuses/Orbits: No acute finding. Other: None. CT CERVICAL SPINE FINDINGS Alignment: No evidence of traumatic malalignment. Skull base and vertebrae: No acute fracture. No primary bone lesion or focal pathologic process. Soft tissues and spinal canal: No prevertebral fluid or swelling. No visible canal hematoma. Disc levels:  Age commensurate degenerative spondylosis. Upper chest: No acute abnormality. Other: Carotid calcification. IMPRESSION: 1. No acute intracranial abnormality. 2. No acute fracture in the cervical spine. Electronically Signed   By: Minerva Fester M.D.   On: 02/18/2023 20:03   DG Shoulder Left  Result Date: 02/18/2023 CLINICAL DATA:  Fall, left arm pain EXAM: LEFT SHOULDER - 2+ VIEW COMPARISON:  None Available. FINDINGS: There is a left humeral neck fracture. Fracture also through the greater tuberosity. Mild displacement. No subluxation or dislocation. AC joint is intact. IMPRESSION: Left proximal humeral fracture involving the humeral neck and greater tuberosity. Electronically Signed   By: Charlett Nose M.D.   On: 02/18/2023 20:03    Procedures Procedures    Medications Ordered in ED Medications  fentaNYL (SUBLIMAZE) injection 50 mcg (50 mcg Intravenous Given 02/18/23 1813)    ED Course/ Medical Decision Making/ A&P                                 Medical Decision Making Amount and/or Complexity of Data  Reviewed Radiology: ordered.  Risk Prescription drug management.   JAMEICE SARSOUR is here with left shoulder pain after fall.  History of diabetes, high cholesterol.  Normal vitals.  No fever.  Melissa [name] not on blood thinners.  Tenderness to the left shoulder.  Differential diagnosis likely humerus fracture, seems less likely to be shoulder dislocation.  Will get CT scan of the head and neck given fall and Melissa [name] age.  Melissa [name] does not think Melissa [name] lost consciousness.  This is a mechanical fall.  Will get chest x-ray, humerus x-ray as well.  My review and interpretation of images I do not see any acute abnormality in the head or neck.  X-rays appear to show fracture of the humeral neck/head but there is no shoulder dislocation.  Will place in a sling and have Melissa [name] follow-up with orthopedics.  Will prescribe oxycodone.  Educated about this medicine.  Recommend Tylenol and ibuprofen.  No weightbearing to the upper extremity.  Discharged in good condition.  Understands return precautions.  This chart was dictated using voice recognition software.  Despite best efforts to proofread,  errors can occur which can change the documentation meaning.         Final Clinical Impression(s) / ED Diagnoses Final diagnoses:  Other closed displaced fracture of proximal end of left humerus, initial encounter    Rx / DC Orders ED Discharge Orders          Ordered    oxyCODONE (ROXICODONE) 5 MG immediate release tablet  Every 6 hours PRN        02/18/23 1900              Virgina Norfolk, DO 02/18/23 2011

## 2023-02-21 NOTE — Progress Notes (Signed)
Lumbar spine x-ray shows arthritis changes at the base of the spine.  No broken bones are present.

## 2023-02-21 NOTE — Progress Notes (Signed)
Left hip x-ray looks okay to radiology.  No fractures are present.  You do have some arthritis present in both hips.

## 2023-02-22 ENCOUNTER — Telehealth: Payer: Self-pay | Admitting: Family Medicine

## 2023-02-22 ENCOUNTER — Ambulatory Visit: Payer: No Typology Code available for payment source | Admitting: Physical Therapy

## 2023-02-22 DIAGNOSIS — S42295A Other nondisplaced fracture of upper end of left humerus, initial encounter for closed fracture: Secondary | ICD-10-CM | POA: Diagnosis not present

## 2023-02-22 NOTE — Telephone Encounter (Signed)
Notified pt's daughter prior Berkley Harvey. Process cannot be started until her app on 9/20. Pt's daughter stated she would let her mother know.

## 2023-02-22 NOTE — Telephone Encounter (Signed)
Patients daughter called to report recent falls and to inquire about receiving home health for her mother. She is needing home health due to her children all living out of town and no one to be with her on a daily basis. They called [employer] [facility] [facility] to inquire about these services and was told she will need to have a prior authorization from her PCP. Melissa [name] is scheduled for an appointment on 9/20. However if the process can be started prior to the appt date would be great.  Her daugther Melissa [name] can be reached at [phone]. [name] spoke then handed the phone over to Melissa [name]  to discuss the matter.  Please give them a call to discuss the process.

## 2023-02-25 ENCOUNTER — Inpatient Hospital Stay (HOSPITAL_BASED_OUTPATIENT_CLINIC_OR_DEPARTMENT_OTHER)
Admission: EM | Admit: 2023-02-25 | Discharge: 2023-03-03 | DRG: 176 | Disposition: A | Discharge status: Skilled Nursing Facility | Payer: No Typology Code available for payment source | Attending: Internal Medicine | Admitting: Internal Medicine

## 2023-02-25 ENCOUNTER — Encounter (HOSPITAL_BASED_OUTPATIENT_CLINIC_OR_DEPARTMENT_OTHER): Payer: Self-pay | Admitting: Emergency Medicine

## 2023-02-25 ENCOUNTER — Emergency Department (HOSPITAL_BASED_OUTPATIENT_CLINIC_OR_DEPARTMENT_OTHER): Payer: No Typology Code available for payment source

## 2023-02-25 DIAGNOSIS — S42202A Unspecified fracture of upper end of left humerus, initial encounter for closed fracture: Secondary | ICD-10-CM | POA: Diagnosis not present

## 2023-02-25 DIAGNOSIS — H3530 Unspecified macular degeneration: Secondary | ICD-10-CM | POA: Diagnosis present

## 2023-02-25 DIAGNOSIS — Z8249 Family history of ischemic heart disease and other diseases of the circulatory system: Secondary | ICD-10-CM

## 2023-02-25 DIAGNOSIS — K449 Diaphragmatic hernia without obstruction or gangrene: Secondary | ICD-10-CM | POA: Diagnosis not present

## 2023-02-25 DIAGNOSIS — Z1152 Encounter for screening for COVID-19: Secondary | ICD-10-CM

## 2023-02-25 DIAGNOSIS — Z79891 Long term (current) use of opiate analgesic: Secondary | ICD-10-CM | POA: Diagnosis not present

## 2023-02-25 DIAGNOSIS — Z7984 Long term (current) use of oral hypoglycemic drugs: Secondary | ICD-10-CM | POA: Diagnosis not present

## 2023-02-25 DIAGNOSIS — I3481 Nonrheumatic mitral (valve) annulus calcification: Secondary | ICD-10-CM | POA: Diagnosis not present

## 2023-02-25 DIAGNOSIS — M79602 Pain in left arm: Secondary | ICD-10-CM | POA: Diagnosis not present

## 2023-02-25 DIAGNOSIS — R918 Other nonspecific abnormal finding of lung field: Secondary | ICD-10-CM | POA: Diagnosis not present

## 2023-02-25 DIAGNOSIS — E785 Hyperlipidemia, unspecified: Secondary | ICD-10-CM | POA: Diagnosis present

## 2023-02-25 DIAGNOSIS — W19XXXD Unspecified fall, subsequent encounter: Secondary | ICD-10-CM | POA: Diagnosis not present

## 2023-02-25 DIAGNOSIS — Z7952 Long term (current) use of systemic steroids: Secondary | ICD-10-CM

## 2023-02-25 DIAGNOSIS — I2699 Other pulmonary embolism without acute cor pulmonale: Secondary | ICD-10-CM | POA: Diagnosis not present

## 2023-02-25 DIAGNOSIS — E119 Type 2 diabetes mellitus without complications: Secondary | ICD-10-CM | POA: Diagnosis not present

## 2023-02-25 DIAGNOSIS — R002 Palpitations: Secondary | ICD-10-CM | POA: Diagnosis not present

## 2023-02-25 DIAGNOSIS — I2693 Single subsegmental pulmonary embolism without acute cor pulmonale: Secondary | ICD-10-CM | POA: Diagnosis not present

## 2023-02-25 DIAGNOSIS — Z86711 Personal history of pulmonary embolism: Secondary | ICD-10-CM | POA: Diagnosis present

## 2023-02-25 DIAGNOSIS — G4700 Insomnia, unspecified: Secondary | ICD-10-CM | POA: Diagnosis present

## 2023-02-25 DIAGNOSIS — Z79899 Other long term (current) drug therapy: Secondary | ICD-10-CM

## 2023-02-25 DIAGNOSIS — E1151 Type 2 diabetes mellitus with diabetic peripheral angiopathy without gangrene: Secondary | ICD-10-CM | POA: Diagnosis present

## 2023-02-25 DIAGNOSIS — Z7982 Long term (current) use of aspirin: Secondary | ICD-10-CM

## 2023-02-25 DIAGNOSIS — S42309A Unspecified fracture of shaft of humerus, unspecified arm, initial encounter for closed fracture: Secondary | ICD-10-CM

## 2023-02-25 DIAGNOSIS — I739 Peripheral vascular disease, unspecified: Secondary | ICD-10-CM | POA: Diagnosis present

## 2023-02-25 DIAGNOSIS — I2694 Multiple subsegmental pulmonary emboli without acute cor pulmonale: Secondary | ICD-10-CM | POA: Diagnosis not present

## 2023-02-25 DIAGNOSIS — K219 Gastro-esophageal reflux disease without esophagitis: Secondary | ICD-10-CM | POA: Diagnosis present

## 2023-02-25 DIAGNOSIS — Z7901 Long term (current) use of anticoagulants: Secondary | ICD-10-CM

## 2023-02-25 DIAGNOSIS — F419 Anxiety disorder, unspecified: Secondary | ICD-10-CM | POA: Diagnosis present

## 2023-02-25 DIAGNOSIS — D649 Anemia, unspecified: Secondary | ICD-10-CM | POA: Diagnosis present

## 2023-02-25 DIAGNOSIS — R000 Tachycardia, unspecified: Secondary | ICD-10-CM | POA: Diagnosis not present

## 2023-02-25 DIAGNOSIS — S42292D Other displaced fracture of upper end of left humerus, subsequent encounter for fracture with routine healing: Secondary | ICD-10-CM | POA: Diagnosis not present

## 2023-02-25 DIAGNOSIS — I10 Essential (primary) hypertension: Secondary | ICD-10-CM | POA: Diagnosis present

## 2023-02-25 DIAGNOSIS — S42302D Unspecified fracture of shaft of humerus, left arm, subsequent encounter for fracture with routine healing: Secondary | ICD-10-CM

## 2023-02-25 LAB — D-DIMER, QUANTITATIVE: D-Dimer, Quant: 6.24 ug{FEU}/mL — ABNORMAL HIGH (ref 0.00–0.50)

## 2023-02-25 LAB — COMPREHENSIVE METABOLIC PANEL
ALT: 10 U/L (ref 0–44)
AST: 13 U/L — ABNORMAL LOW (ref 15–41)
Albumin: 3.5 g/dL (ref 3.5–5.0)
Alkaline Phosphatase: 63 U/L (ref 38–126)
Anion gap: 10 (ref 5–15)
BUN: 21 mg/dL (ref 8–23)
CO2: 26 mmol/L (ref 22–32)
Calcium: 8.7 mg/dL — ABNORMAL LOW (ref 8.9–10.3)
Chloride: 103 mmol/L (ref 98–111)
Creatinine, Ser: 1 mg/dL (ref 0.44–1.00)
GFR, Estimated: 55 mL/min — ABNORMAL LOW (ref >60)
Glucose, Bld: 142 mg/dL — ABNORMAL HIGH (ref 70–99)
Potassium: 4.3 mmol/L (ref 3.5–5.1)
Sodium: 139 mmol/L (ref 135–145)
Total Bilirubin: 0.5 mg/dL (ref 0.3–1.2)
Total Protein: 6 g/dL — ABNORMAL LOW (ref 6.5–8.1)

## 2023-02-25 LAB — CBC WITH DIFFERENTIAL/PLATELET
Abs Immature Granulocytes: 0.09 10*3/uL — ABNORMAL HIGH (ref 0.00–0.07)
Basophils Absolute: 0 10*3/uL (ref 0.0–0.1)
Basophils Relative: 1 %
Eosinophils Absolute: 0.2 10*3/uL (ref 0.0–0.5)
Eosinophils Relative: 3 %
HCT: 32.8 % — ABNORMAL LOW (ref 36.0–46.0)
Hemoglobin: 10.9 g/dL — ABNORMAL LOW (ref 12.0–15.0)
Immature Granulocytes: 1 %
Lymphocytes Relative: 13 %
Lymphs Abs: 0.8 10*3/uL (ref 0.7–4.0)
MCH: 31.4 pg (ref 26.0–34.0)
MCHC: 33.2 g/dL (ref 30.0–36.0)
MCV: 94.5 fL (ref 80.0–100.0)
Monocytes Absolute: 0.7 10*3/uL (ref 0.1–1.0)
Monocytes Relative: 11 %
Neutro Abs: 4.5 10*3/uL (ref 1.7–7.7)
Neutrophils Relative %: 71 %
Platelets: 309 10*3/uL (ref 150–400)
RBC: 3.47 MIL/uL — ABNORMAL LOW (ref 3.87–5.11)
RDW: 13 % (ref 11.5–15.5)
WBC: 6.3 10*3/uL (ref 4.0–10.5)
nRBC: 0 % (ref 0.0–0.2)

## 2023-02-25 LAB — RESP PANEL BY RT-PCR (RSV, FLU A&B, COVID)  RVPGX2
Influenza A by PCR: NEGATIVE
Influenza B by PCR: NEGATIVE
Resp Syncytial Virus by PCR: NEGATIVE
SARS Coronavirus 2 by RT PCR: NEGATIVE

## 2023-02-25 LAB — LACTIC ACID, PLASMA: Lactic Acid, Venous: 1.3 mmol/L (ref 0.5–1.9)

## 2023-02-25 LAB — TROPONIN I (HIGH SENSITIVITY)
Troponin I (High Sensitivity): 22 ng/L — ABNORMAL HIGH (ref <18)
Troponin I (High Sensitivity): 21 ng/L — ABNORMAL HIGH (ref <18)

## 2023-02-25 LAB — CBG MONITORING, ED: Glucose-Capillary: 172 mg/dL — ABNORMAL HIGH (ref 70–99)

## 2023-02-25 LAB — BRAIN NATRIURETIC PEPTIDE: B Natriuretic Peptide: 210.6 pg/mL — ABNORMAL HIGH (ref 0.0–100.0)

## 2023-02-25 MED ORDER — INSULIN ASPART 100 UNIT/ML IJ SOLN
0.0000 [IU] | Freq: Three times a day (TID) | INTRAMUSCULAR | Status: DC
  Administered 2023-02-26 (×2): 3 [IU] via SUBCUTANEOUS
  Administered 2023-02-27 – 2023-02-28 (×3): 2 [IU] via SUBCUTANEOUS
  Administered 2023-03-01: 8 [IU] via SUBCUTANEOUS
  Administered 2023-03-01: 3 [IU] via SUBCUTANEOUS
  Administered 2023-03-02 (×2): 2 [IU] via SUBCUTANEOUS

## 2023-02-25 MED ORDER — SODIUM CHLORIDE 0.9 % IV BOLUS
500.0000 mL | Freq: Once | INTRAVENOUS | Status: AC
  Administered 2023-02-25: 500 mL via INTRAVENOUS

## 2023-02-25 MED ORDER — ACETAMINOPHEN 325 MG PO TABS
650.0000 mg | ORAL_TABLET | Freq: Four times a day (QID) | ORAL | Status: DC | PRN
  Administered 2023-02-26: 650 mg via ORAL
  Filled 2023-02-25: qty 2

## 2023-02-25 MED ORDER — INSULIN ASPART 100 UNIT/ML IJ SOLN: 0.0000 [IU] | Freq: Every day | INTRAMUSCULAR | Status: DC

## 2023-02-25 MED ORDER — OXYCODONE HCL 5 MG PO TABS
5.0000 mg | ORAL_TABLET | Freq: Once | ORAL | Status: AC
  Administered 2023-02-25: 5 mg via ORAL
  Filled 2023-02-25: qty 1

## 2023-02-25 MED ORDER — HEPARIN (PORCINE) 25000 UT/250ML-% IV SOLN
850.0000 [IU]/h | INTRAVENOUS | Status: DC
  Administered 2023-02-25: 900 [IU]/h via INTRAVENOUS
  Administered 2023-02-28: 850 [IU]/h via INTRAVENOUS
  Filled 2023-02-25 (×3): qty 250

## 2023-02-25 MED ORDER — HEPARIN BOLUS VIA INFUSION
3500.0000 [IU] | Freq: Once | INTRAVENOUS | Status: AC
  Administered 2023-02-25: 3500 [IU] via INTRAVENOUS

## 2023-02-25 MED ORDER — IOHEXOL 350 MG/ML SOLN
100.0000 mL | Freq: Once | INTRAVENOUS | Status: AC | PRN
  Administered 2023-02-25: 75 mL via INTRAVENOUS

## 2023-02-25 MED ORDER — ACETAMINOPHEN 325 MG PO TABS
650.0000 mg | ORAL_TABLET | Freq: Once | ORAL | Status: AC
  Administered 2023-02-25: 650 mg via ORAL
  Filled 2023-02-25: qty 2

## 2023-02-25 NOTE — Progress Notes (Signed)
ANTICOAGULATION CONSULT NOTE - Initial Consult  Pharmacy Consult for heparin Indication: pulmonary embolus  No Known Allergies  Patient Measurements:   Heparin Dosing Weight: TBW  Vital Signs: Temp: 99.1 F (37.3 C) (09/07 1639) BP: 118/96 (09/07 2030) Pulse Rate: 102 (09/07 2030)  Labs: Recent Labs    02/25/23 1723 02/25/23 2019  HGB 10.9*  --   HCT 32.8*  --   PLT 309  --   CREATININE 1.00  --   TROPONINIHS 22* 21*    Estimated Creatinine Clearance: 33.4 mL/min (by C-G formula based on SCr of 1 mg/dL).   Medical History: Past Medical History:  Diagnosis Date   Burning tongue syndrome 11/11/2015   Colon polyps    Cystocele with prolapse 2019   Dr. Marice Potter- Monitor for now.    Detached retina, right    Diabetes mellitus    Diaphragmatic hernia    GERD (gastroesophageal reflux disease)    Hyperlipidemia    Insomnia    Macular degeneration     Assessment: 81 YOF presenting with tachycardia, CT angio chest with PE, no RHS.  She is not on anticoagulation PTA  Goal of Therapy:  Heparin level 0.3-0.7 units/ml Monitor platelets by anticoagulation protocol: Yes   Plan:  Heparin 3500 units IV x 1, and gtt at 900 units/hr F/u 8 hour heparin level  Daylene Posey, PharmD, [facility] Clinical Pharmacist ED Pharmacist Phone # [phone] 02/25/2023 9:24 PM

## 2023-02-25 NOTE — ED Triage Notes (Signed)
High heart rate and respiration rate.  Fall last Saturday, ran out of pain medication reports severe pain.

## 2023-02-25 NOTE — Progress Notes (Signed)
02/25/2023  Scan/hx/labs reviewed. Should be okay for [facility] alone. With mild elevation of cardiac markers probably should watch overnight. Can get echo for completeness' sake. Available PRN  Myrla Halsted MD PCCM

## 2023-02-25 NOTE — ED Provider Notes (Signed)
[name] EMERGENCY DEPARTMENT AT [facility] Provider Note   CSN: [id number] Arrival date & time: 02/25/23  1628     History  Chief Complaint  Patient presents with   Tachycardia    Melissa [name] is a 88 y.o. female.  Patient is an 88 year old female with recent left shoulder fracture, diabetes, hyperlipidemia, anxiety presenting to the emergency department with concern for tachycardia and tachypnea.  The patient states that her physical therapist was at her house today to evaluate her but noted that her heart rate was elevated and that she appeared to be breathing hard while at rest and recommended that she come to the emergency department.  The patient states that her arm has been sore but her pain has been controlled and she otherwise has no new complaints.  She states that she does not feel her heart racing but has some mild chest discomfort.  She denies any shortness of breath.  She denies any fever or cough or lower extremity swelling.  She denies any recent nausea, vomiting or diarrhea and states that she has caretakers come by with her meals every day and that she has been eating normally.  Of note she states that she does take metoprolol for "a high heart rate" and she may have missed a few doses this past week due to being distracted with her shoulder fracture.  She denies any recent hospitalizations or surgery recent long travel in the car or plane, hormone use or cancer history.  The history is provided by the patient and a relative.       Home Medications Prior to Admission medications   Medication Sig Start Date End Date Taking? Authorizing Provider  albuterol (VENTOLIN HFA) 108 (90 Base) MCG/ACT inhaler Inhale 2 puffs into the lungs every 6 (six) hours as needed for wheezing or shortness of breath. 08/19/22   Kuneff, Renee A, DO  aspirin 81 MG tablet Take 81 mg by mouth daily.    [provider]  atorvastatin (LIPITOR) 40 MG tablet Take 1 tablet (40 mg  total) by mouth daily. 11/09/22   Kuneff, Renee A, DO  Calcium Carbonate-Vitamin D (OYSTER SHELL CALCIUM/D) 250-125 MG-UNIT TABS Take by mouth.    [provider]  escitalopram (LEXAPRO) 5 MG tablet Take 1 tablet (5 mg total) by mouth daily. 11/09/22   Kuneff, Renee A, DO  fluocinonide cream (LIDEX) 0.05 % Apply 1 Application topically 2 (two) times daily. 05/17/22   Kuneff, Renee A, DO  gabapentin (NEURONTIN) 300 MG capsule Take 1 capsule (300 mg total) by mouth at bedtime. 02/16/23   Rodolph Bong, MD  metFORMIN (GLUCOPHAGE) 500 MG tablet 500 mg (one tablet) in the morning and 1,000 mg (2 tablets) at night 11/09/22   Kuneff, Renee A, DO  metoprolol succinate (TOPROL-XL) 25 MG 24 hr tablet Take 1 tablet (25 mg total) by mouth daily. 11/09/22   Kuneff, Renee A, DO  Multiple Vitamin (MULTIVITAMIN) tablet Take 1 tablet by mouth daily.    [provider]  oxyCODONE (ROXICODONE) 5 MG immediate release tablet Take 1 tablet (5 mg total) by mouth every 6 (six) hours as needed for up to 20 doses for breakthrough pain. 02/18/23   Curatolo, Adam, DO  pantoprazole (PROTONIX) 40 MG tablet Take 1 tablet (40 mg total) by mouth daily. 11/09/22   Kuneff, Renee A, DO  predniSONE (DELTASONE) 20 MG tablet 60 mg x1d, 40 mg x3d, 20 mg x2d, 10 mg x2d 02/16/23   [name]  S, MD  tiZANidine (ZANAFLEX) 4 MG tablet Take 1 tablet (4 mg total) by mouth 2 (two) times daily as needed for muscle spasms. Patient not taking: Reported on 02/16/2023 01/24/23   Felix Pacini A, DO      Allergies    Patient has no known allergies.    Review of Systems   Review of Systems  Physical Exam Updated Vital Signs BP (!) 118/96   Pulse (!) 102   Temp 98.1 F (36.7 C)   Resp 19   SpO2 95%  Physical Exam Vitals and nursing note reviewed.  Constitutional:      General: She is not in acute distress.    Appearance: Normal appearance.  HENT:     Head: Normocephalic and atraumatic.     Nose: Nose normal.     Mouth/Throat:      Mouth: Mucous membranes are moist.     Pharynx: Oropharynx is clear.  Eyes:     Extraocular Movements: Extraocular movements intact.     Comments: Right eye with surgical pupil  Cardiovascular:     Rate and Rhythm: Regular rhythm. Tachycardia present.     Pulses: Normal pulses.     Heart sounds: Normal heart sounds.  Pulmonary:     Effort: Pulmonary effort is normal.     Breath sounds: Normal breath sounds.  Abdominal:     General: Abdomen is flat.     Palpations: Abdomen is soft.     Tenderness: There is no abdominal tenderness.  Musculoskeletal:        General: Normal range of motion.     Cervical back: Normal range of motion.     Right lower leg: No edema.     Left lower leg: No edema.     Comments: Left upper extremity in sling with bruising to left shoulder  Skin:    General: Skin is warm and dry.  Neurological:     General: No focal deficit present.     Mental Status: She is alert and oriented to person, place, and time.  Psychiatric:        Mood and Affect: Mood normal.        Behavior: Behavior normal.     ED Results / Procedures / Treatments   Labs (all labs ordered are listed, but only abnormal results are displayed) Labs Reviewed  COMPREHENSIVE METABOLIC PANEL - Abnormal; Notable for the following components:      Result Value   Glucose, Bld 142 (*)    Calcium 8.7 (*)    Total Protein 6.0 (*)    AST 13 (*)    GFR, Estimated 55 (*)    All other components within normal limits  CBC WITH DIFFERENTIAL/PLATELET - Abnormal; Notable for the following components:   RBC 3.47 (*)    Hemoglobin 10.9 (*)    HCT 32.8 (*)    Abs Immature Granulocytes 0.09 (*)    All other components within normal limits  D-DIMER, QUANTITATIVE - Abnormal; Notable for the following components:   D-Dimer, Quant 6.24 (*)    All other components within normal limits  BRAIN NATRIURETIC PEPTIDE - Abnormal; Notable for the following components:   B Natriuretic Peptide 210.6 (*)     All other components within normal limits  TROPONIN I (HIGH SENSITIVITY) - Abnormal; Notable for the following components:   Troponin I (High Sensitivity) 22 (*)    All other components within normal limits  TROPONIN I (HIGH SENSITIVITY) - Abnormal; Notable for the following  components:   Troponin I (High Sensitivity) 21 (*)    All other components within normal limits  RESP PANEL BY RT-PCR (RSV, FLU A&B, COVID)  [id number]  LACTIC ACID, PLASMA  HEPARIN LEVEL (UNFRACTIONATED)  CBC    EKG EKG Interpretation Date/Time:  Saturday February 25 2023 16:45:44 EDT Ventricular Rate:  115 PR Interval:  138 QRS Duration:  116 QT Interval:  336 QTC Calculation: 464 R Axis:   123  Text Interpretation: Sinus tachycardia Low voltage QRS Right bundle branch block Abnormal ECG No significant change since last tracing Confirmed by Elayne Snare ([id number]) on 02/25/2023 4:54:39 PM  Radiology CT Angio Chest PE W/Cm &/Or Wo Cm  Result Date: 02/25/2023 CLINICAL DATA:  Pulmonary embolus suspected EXAM: CT ANGIOGRAPHY CHEST WITH CONTRAST TECHNIQUE: Multidetector CT imaging of the chest was performed using the standard protocol during bolus administration of intravenous contrast. Multiplanar CT image reconstructions and MIPs were obtained to evaluate the vascular anatomy. RADIATION DOSE REDUCTION: This exam was performed according to the departmental dose-optimization program which includes automated exposure control, adjustment of the mA and/or kV according to patient size and/or use of iterative reconstruction technique. CONTRAST:  75mL OMNIPAQUE IOHEXOL 350 MG/ML SOLN COMPARISON:  None Available. FINDINGS: Cardiovascular: Proximal segmental pulmonary embolus of the right middle lobe and small subsegmental pulmonary embolus of the left upper lobe. Normal heart size. No evidence of right heart strain (RV/LV ratio = 0.8). Mitral annular calcifications. Normal caliber thoracic aorta with moderate atherosclerotic  disease. Mediastinum/Nodes: Large hiatal hernia. Thyroid is unremarkable. No enlarged lymph nodes seen in the chest. Lungs/Pleura: Central airways are patent. Bilateral lower lobe atelectasis secondary to compression from large hiatal hernia. Small peripheral opacity of the lateral right middle lobe. No pleural effusion pneumothorax. Upper Abdomen: Partially visualized simple appearing bilateral, no specific follow-up imaging is necessary. Musculoskeletal: No chest wall abnormality. No acute or significant osseous findings. Review of the MIP images confirms the above findings. IMPRESSION: 1. Proximal segmental pulmonary embolus of the right middle lobe and small subsegmental pulmonary embolus of the left upper lobe. No evidence of right heart strain. 2. Small peripheral consolidation of the lateral right middle lobe, could be due to atelectasis or a small infarct. 3. Large hiatal hernia. 4. Aortic Atherosclerosis (ICD10-I70.0). Critical Value/emergent results were called by telephone at the time of interpretation on 02/25/2023 at 8:04 pm to provider [facility] , who verbally acknowledged these results. Electronically Signed   By: Allegra Lai M.D.   On: 02/25/2023 20:05   DG Chest Port 1 View  Result Date: 02/25/2023 CLINICAL DATA:  Palpitations and severe chest pain. History of recent fall. EXAM: PORTABLE CHEST 1 VIEW COMPARISON:  February 18, 2023 FINDINGS: There is stable moderate to marked severity enlargement of the cardiac silhouette. Marked severity calcification of the aortic arch is seen. Stable mild to moderate severity left basilar scarring and/or atelectasis is seen. No pleural effusion or pneumothorax is identified. There is a large hiatal hernia. A mildly displaced fracture deformity of indeterminate age is seen involving the neck of the proximal left humerus. This is present on the prior study. Multilevel degenerative changes seen throughout the thoracic spine. IMPRESSION: 1. Stable  cardiomegaly with stable left basilar scarring and/or atelectasis. 2. Large hiatal hernia. 3. Fracture deformity of indeterminate age involving the proximal left humerus. Electronically Signed   By: Aram Candela M.D.   On: 02/25/2023 18:35    Procedures .Critical Care  Performed by: Rexford Maus, DO Authorized by: Rexford Maus,  DO   Critical care provider statement:    Critical care time (minutes):  40   Critical care time was exclusive of:  Separately billable procedures and treating other patients   Critical care was necessary to treat or prevent imminent or life-threatening deterioration of the following conditions:  Respiratory failure   Critical care was time spent personally by me on the following activities:  Development of treatment plan with patient or surrogate, discussions with consultants, evaluation of patient's response to treatment, examination of patient, obtaining history from patient or surrogate, ordering and performing treatments and interventions, ordering and review of laboratory studies, ordering and review of radiographic studies, pulse oximetry, re-evaluation of patient's condition and review of old charts   I assumed direction of critical care for this patient from another provider in my specialty: no     Care discussed with: admitting provider       Medications Ordered in ED Medications  heparin ADULT infusion 100 units/mL (25000 units/245mL) (900 Units/hr Intravenous New Bag/Given 02/25/23 2209)  sodium chloride 0.9 % bolus 500 mL (500 mLs Intravenous New Bag/Given 02/25/23 1734)  iohexol (OMNIPAQUE) 350 MG/ML injection 100 mL (75 mLs Intravenous Contrast Given 02/25/23 1911)  heparin bolus via infusion 3,500 Units (3,500 Units Intravenous Bolus from Bag 02/25/23 2208)  acetaminophen (TYLENOL) tablet 650 mg (650 mg Oral Given 02/25/23 2214)  oxyCODONE (Oxy IR/ROXICODONE) immediate release tablet 5 mg (5 mg Oral Given 02/25/23 2214)    ED Course/ Medical  Decision Making/ A&P Clinical Course as of 02/25/23 2219  Sat Feb 25, 2023  1823 Elevated d-dimer 6.24, will have CTPE study. [VK]  2003 I received a call from radiology that CT is positive for R segmental and L subsegmental PE, no evidence of heart strain. [VK]  2150 I spoke with Dr. Katrinka Blazing with critical care who states patient does not require ICU admission and is ok for hospitalist admission. [VK]    Clinical Course User Index [VK] Rexford Maus, DO                                 Medical Decision Making This patient presents to the ED with chief complaint(s) of tachycardia with pertinent past medical history of HLD, DM, recent L shoulder fx which further complicates the presenting complaint. The complaint involves an extensive differential diagnosis and also carries with it a high risk of complications and morbidity.    The differential diagnosis includes arrhythmia, anemia, pain, infection, dehydration, electrolyte abnormality, considering possible PE though has no known PE risk factors making this less likely  Additional history obtained: Additional history obtained from family Records reviewed Primary Care Documents and cardiology records  ED Course and Reassessment: On patient's arrival she was tachycardic to the 110s, otherwise hemodynamically stable in no acute distress with no increased work of breathing.  Patient denies significant pain and seems unlikely that this tachycardia is in a pain response.  She was initially evaluated by triage and had viral swab and EKG performed.  EKG did show sinus tachycardia without acute ischemic changes.  Patient will have labs including a D-dimer and chest x-ray.  She will be given gentle fluids and will be closely reassessed.  Independent labs interpretation:  The following labs were independently interpreted: mildly elevated troponin and bnp  Independent visualization of imaging: - I independently visualized the following imaging with  scope of interpretation limited to determining acute life threatening conditions related  to emergency care: CTPE, which revealed R segmental and small L subsegmental PE w/o R heart strain  Consultation: - Consulted or discussed management/test interpretation w/ external professional: critical care, hospitalist  Consideration for admission or further workup: patient requires admission for her PE Social Determinants of health: N/A    Amount and/or Complexity of Data Reviewed Labs: ordered. Radiology: ordered.  Risk OTC drugs. Prescription drug management.          Final Clinical Impression(s) / ED Diagnoses Final diagnoses:  None    Rx / DC Orders ED Discharge Orders     None         Rexford Maus, DO 02/25/23 2219

## 2023-02-26 ENCOUNTER — Encounter (HOSPITAL_COMMUNITY): Payer: No Typology Code available for payment source

## 2023-02-26 ENCOUNTER — Other Ambulatory Visit: Payer: Self-pay

## 2023-02-26 DIAGNOSIS — E1151 Type 2 diabetes mellitus with diabetic peripheral angiopathy without gangrene: Secondary | ICD-10-CM | POA: Diagnosis not present

## 2023-02-26 DIAGNOSIS — S42252D Displaced fracture of greater tuberosity of left humerus, subsequent encounter for fracture with routine healing: Secondary | ICD-10-CM

## 2023-02-26 DIAGNOSIS — S42309A Unspecified fracture of shaft of humerus, unspecified arm, initial encounter for closed fracture: Secondary | ICD-10-CM

## 2023-02-26 DIAGNOSIS — Z7901 Long term (current) use of anticoagulants: Secondary | ICD-10-CM | POA: Diagnosis not present

## 2023-02-26 DIAGNOSIS — R41841 Cognitive communication deficit: Secondary | ICD-10-CM | POA: Diagnosis not present

## 2023-02-26 DIAGNOSIS — Z1152 Encounter for screening for COVID-19: Secondary | ICD-10-CM | POA: Diagnosis not present

## 2023-02-26 DIAGNOSIS — I2693 Single subsegmental pulmonary embolism without acute cor pulmonale: Secondary | ICD-10-CM | POA: Diagnosis not present

## 2023-02-26 DIAGNOSIS — D649 Anemia, unspecified: Secondary | ICD-10-CM | POA: Diagnosis not present

## 2023-02-26 DIAGNOSIS — Z7984 Long term (current) use of oral hypoglycemic drugs: Secondary | ICD-10-CM | POA: Diagnosis not present

## 2023-02-26 DIAGNOSIS — W19XXXD Unspecified fall, subsequent encounter: Secondary | ICD-10-CM | POA: Diagnosis present

## 2023-02-26 DIAGNOSIS — I739 Peripheral vascular disease, unspecified: Secondary | ICD-10-CM | POA: Diagnosis not present

## 2023-02-26 DIAGNOSIS — Z86711 Personal history of pulmonary embolism: Secondary | ICD-10-CM | POA: Diagnosis not present

## 2023-02-26 DIAGNOSIS — Z8249 Family history of ischemic heart disease and other diseases of the circulatory system: Secondary | ICD-10-CM | POA: Diagnosis not present

## 2023-02-26 DIAGNOSIS — F419 Anxiety disorder, unspecified: Secondary | ICD-10-CM | POA: Diagnosis not present

## 2023-02-26 DIAGNOSIS — G4700 Insomnia, unspecified: Secondary | ICD-10-CM | POA: Diagnosis not present

## 2023-02-26 DIAGNOSIS — E785 Hyperlipidemia, unspecified: Secondary | ICD-10-CM | POA: Diagnosis not present

## 2023-02-26 DIAGNOSIS — M6281 Muscle weakness (generalized): Secondary | ICD-10-CM | POA: Diagnosis not present

## 2023-02-26 DIAGNOSIS — F339 Major depressive disorder, recurrent, unspecified: Secondary | ICD-10-CM | POA: Diagnosis not present

## 2023-02-26 DIAGNOSIS — Z7952 Long term (current) use of systemic steroids: Secondary | ICD-10-CM | POA: Diagnosis not present

## 2023-02-26 DIAGNOSIS — E113213 Type 2 diabetes mellitus with mild nonproliferative diabetic retinopathy with macular edema, bilateral: Secondary | ICD-10-CM | POA: Diagnosis not present

## 2023-02-26 DIAGNOSIS — I2699 Other pulmonary embolism without acute cor pulmonale: Secondary | ICD-10-CM

## 2023-02-26 DIAGNOSIS — E441 Mild protein-calorie malnutrition: Secondary | ICD-10-CM | POA: Diagnosis not present

## 2023-02-26 DIAGNOSIS — E119 Type 2 diabetes mellitus without complications: Secondary | ICD-10-CM

## 2023-02-26 DIAGNOSIS — Z7982 Long term (current) use of aspirin: Secondary | ICD-10-CM | POA: Diagnosis not present

## 2023-02-26 DIAGNOSIS — I10 Essential (primary) hypertension: Secondary | ICD-10-CM | POA: Diagnosis not present

## 2023-02-26 DIAGNOSIS — I2694 Multiple subsegmental pulmonary emboli without acute cor pulmonale: Secondary | ICD-10-CM | POA: Diagnosis not present

## 2023-02-26 DIAGNOSIS — R002 Palpitations: Secondary | ICD-10-CM | POA: Diagnosis not present

## 2023-02-26 DIAGNOSIS — H3530 Unspecified macular degeneration: Secondary | ICD-10-CM | POA: Diagnosis not present

## 2023-02-26 DIAGNOSIS — Z79899 Other long term (current) drug therapy: Secondary | ICD-10-CM | POA: Diagnosis not present

## 2023-02-26 DIAGNOSIS — I2609 Other pulmonary embolism with acute cor pulmonale: Secondary | ICD-10-CM | POA: Diagnosis not present

## 2023-02-26 DIAGNOSIS — K219 Gastro-esophageal reflux disease without esophagitis: Secondary | ICD-10-CM | POA: Diagnosis not present

## 2023-02-26 DIAGNOSIS — R269 Unspecified abnormalities of gait and mobility: Secondary | ICD-10-CM | POA: Diagnosis not present

## 2023-02-26 DIAGNOSIS — S42302D Unspecified fracture of shaft of humerus, left arm, subsequent encounter for fracture with routine healing: Secondary | ICD-10-CM | POA: Diagnosis not present

## 2023-02-26 HISTORY — DX: Unspecified fracture of shaft of humerus, unspecified arm, initial encounter for closed fracture: S42.309A

## 2023-02-26 LAB — CBC
HCT: 33.9 % — ABNORMAL LOW (ref 36.0–46.0)
Hemoglobin: 10.8 g/dL — ABNORMAL LOW (ref 12.0–15.0)
MCH: 30.5 pg (ref 26.0–34.0)
MCHC: 31.9 g/dL (ref 30.0–36.0)
MCV: 95.8 fL (ref 80.0–100.0)
Platelets: 321 10*3/uL (ref 150–400)
RBC: 3.54 MIL/uL — ABNORMAL LOW (ref 3.87–5.11)
RDW: 13.2 % (ref 11.5–15.5)
WBC: 6.8 10*3/uL (ref 4.0–10.5)
nRBC: 0 % (ref 0.0–0.2)

## 2023-02-26 LAB — GLUCOSE, CAPILLARY
Glucose-Capillary: 169 mg/dL — ABNORMAL HIGH (ref 70–99)
Glucose-Capillary: 163 mg/dL — ABNORMAL HIGH (ref 70–99)
Glucose-Capillary: 127 mg/dL — ABNORMAL HIGH (ref 70–99)

## 2023-02-26 LAB — BASIC METABOLIC PANEL
Anion gap: 14 (ref 5–15)
BUN: 17 mg/dL (ref 8–23)
CO2: 23 mmol/L (ref 22–32)
Calcium: 8.9 mg/dL (ref 8.9–10.3)
Chloride: 101 mmol/L (ref 98–111)
Creatinine, Ser: 0.86 mg/dL (ref 0.44–1.00)
GFR, Estimated: >60 mL/min (ref >60)
Glucose, Bld: 153 mg/dL — ABNORMAL HIGH (ref 70–99)
Potassium: 4 mmol/L (ref 3.5–5.1)
Sodium: 138 mmol/L (ref 135–145)

## 2023-02-26 LAB — HEPARIN LEVEL (UNFRACTIONATED)
Heparin Unfractionated: 0.83 [IU]/mL — ABNORMAL HIGH (ref 0.30–0.70)
Heparin Unfractionated: 0.52 [IU]/mL (ref 0.30–0.70)

## 2023-02-26 LAB — HEMOGLOBIN A1C
Hgb A1c MFr Bld: 7 % — ABNORMAL HIGH (ref 4.8–5.6)
Mean Plasma Glucose: 154.2 mg/dL

## 2023-02-26 LAB — CBG MONITORING, ED: Glucose-Capillary: 107 mg/dL — ABNORMAL HIGH (ref 70–99)

## 2023-02-26 MED ORDER — ATORVASTATIN CALCIUM 40 MG PO TABS
40.0000 mg | ORAL_TABLET | Freq: Every day | ORAL | Status: DC
  Administered 2023-02-27 – 2023-03-03 (×5): 40 mg via ORAL
  Filled 2023-02-26 (×5): qty 1

## 2023-02-26 MED ORDER — ACETAMINOPHEN 325 MG PO TABS
650.0000 mg | ORAL_TABLET | Freq: Four times a day (QID) | ORAL | Status: DC | PRN
  Administered 2023-03-01 – 2023-03-02 (×2): 650 mg via ORAL
  Filled 2023-02-26 (×2): qty 2

## 2023-02-26 MED ORDER — ONDANSETRON HCL 4 MG PO TABS
4.0000 mg | ORAL_TABLET | Freq: Four times a day (QID) | ORAL | Status: DC | PRN
  Administered 2023-03-02: 4 mg via ORAL
  Filled 2023-02-26: qty 1

## 2023-02-26 MED ORDER — POLYETHYLENE GLYCOL 3350 17 G PO PACK: 17.0000 g | PACK | Freq: Every day | ORAL | Status: DC | PRN

## 2023-02-26 MED ORDER — MELATONIN 5 MG PO TABS
5.0000 mg | ORAL_TABLET | Freq: Every evening | ORAL | Status: DC | PRN
  Administered 2023-02-26 – 2023-03-02 (×2): 5 mg via ORAL
  Filled 2023-02-26 (×2): qty 1

## 2023-02-26 MED ORDER — ONDANSETRON HCL 4 MG/2ML IJ SOLN: 4.0000 mg | Freq: Four times a day (QID) | INTRAMUSCULAR | Status: DC | PRN

## 2023-02-26 MED ORDER — OXYCODONE HCL 5 MG PO TABS
5.0000 mg | ORAL_TABLET | Freq: Four times a day (QID) | ORAL | Status: DC | PRN
  Administered 2023-02-27 – 2023-03-03 (×6): 5 mg via ORAL
  Filled 2023-02-26 (×6): qty 1

## 2023-02-26 MED ORDER — ESCITALOPRAM OXALATE 10 MG PO TABS
5.0000 mg | ORAL_TABLET | Freq: Every day | ORAL | Status: DC
  Administered 2023-02-27 – 2023-03-03 (×5): 5 mg via ORAL
  Filled 2023-02-26 (×5): qty 1

## 2023-02-26 MED ORDER — SODIUM CHLORIDE 0.9% FLUSH
3.0000 mL | Freq: Two times a day (BID) | INTRAVENOUS | Status: DC
  Administered 2023-02-26 – 2023-03-03 (×7): 3 mL via INTRAVENOUS

## 2023-02-26 MED ORDER — ACETAMINOPHEN 650 MG RE SUPP: 650.0000 mg | Freq: Four times a day (QID) | RECTAL | Status: DC | PRN

## 2023-02-26 MED ORDER — GABAPENTIN 300 MG PO CAPS
300.0000 mg | ORAL_CAPSULE | Freq: Every day | ORAL | Status: DC
  Administered 2023-02-26 – 2023-03-02 (×5): 300 mg via ORAL
  Filled 2023-02-26 (×5): qty 1

## 2023-02-26 MED ORDER — OXYCODONE HCL 5 MG PO TABS
5.0000 mg | ORAL_TABLET | Freq: Once | ORAL | Status: AC
  Administered 2023-02-26: 5 mg via ORAL
  Filled 2023-02-26: qty 1

## 2023-02-26 MED ORDER — ASPIRIN 81 MG PO TABS: 81.0000 mg | ORAL_TABLET | Freq: Every day | ORAL | Status: DC

## 2023-02-26 MED ORDER — ASPIRIN 81 MG PO TBEC
81.0000 mg | DELAYED_RELEASE_TABLET | Freq: Every day | ORAL | Status: DC
  Administered 2023-02-26 – 2023-03-03 (×6): 81 mg via ORAL
  Filled 2023-02-26 (×6): qty 1

## 2023-02-26 NOTE — ED Notes (Signed)
[facility] at [facility] sending transport for Bed Ready at [facility] 5W RM# [id number].-ABB(NS)

## 2023-02-26 NOTE — Assessment & Plan Note (Addendum)
-   Continue home aspirin and statin 

## 2023-02-26 NOTE — Progress Notes (Signed)
ANTICOAGULATION CONSULT NOTE - Follow Up Consult  Pharmacy Consult for Heparin Indication: pulmonary embolus  No Known Allergies  Patient Measurements: Heparin Dosing Weight: TBW  Vital Signs: Temp: 98 F (36.7 C) (09/08 0600) BP: 132/76 (09/08 0530) Pulse Rate: 84 (09/08 0530)  Labs: Recent Labs    02/25/23 1723 02/25/23 2019 02/26/23 0806  HGB 10.9*  --   --   HCT 32.8*  --   --   PLT 309  --   --   HEPARINUNFRC  --   --  0.83*  CREATININE 1.00  --   --   TROPONINIHS 22* 21*  --     Estimated Creatinine Clearance: 33.4 mL/min (by C-G formula based on SCr of 1 mg/dL).   Medications:  (Not in a hospital admission)   Assessment: 88 years of female on IV heparin for acute pulmonary embolism.   Initial heparin level 0.83 - slightly above goal  and s/p bolus.   Goal of Therapy:  Heparin level 0.3-0.7 units/ml Monitor platelets by anticoagulation protocol: Yes   Plan:  Reduce Heparin to 850 units/hr Repeat Heparin level in 8 hours.    Link Snuffer, PharmD, BCPS, BCCCP Please refer to [facility] for [facility] Pharmacy numbers 02/26/2023,9:32 AM

## 2023-02-26 NOTE — Assessment & Plan Note (Signed)
Patient is not on any antihypertensives with the exception of metoprolol which is for palpitations.  Blood pressure is well-controlled at this time.

## 2023-02-26 NOTE — Assessment & Plan Note (Signed)
Hemoglobin of 10.9 noted yesterday, stable today at 10.8.  Previously normal blood counts at 13.8.  Potentially reactive in the setting of PE.  No evidence of acute bleeding at this time  - Continue to monitor while admitted - Outpatient follow-up with PCP

## 2023-02-26 NOTE — Assessment & Plan Note (Addendum)
Patient is presenting after physical therapist noted patient was tachycardic with increased work of breathing.  CTA with evidence of both segmental and subsegmental PE. Although no evidence of right heart strain on imaging, BNP and troponins are elevated.  Per critical care note, no indication for tPA or thrombectomy at this time.   No recent provoking factors. Patient states that despite falls, she has remained ambulatory.   - Telemetry monitoring - Continue heparin per pharmacy dosing - Transition to Eliquis versus Xarelto on discharge - Echocardiogram ordered

## 2023-02-26 NOTE — ED Notes (Addendum)
Assisted patient to [facility]. Ambulated well and voided x1. Pt given oatmeal and po fluids. A/ox4, VSS. Neuro WNL.

## 2023-02-26 NOTE — Assessment & Plan Note (Addendum)
Due to humerus fracture, patient has had difficulty around her home ADLs, now compounded by PE.  - PT/OT; discussed that she may require short-term rehab and patient/family are in agreement - Home oxycodone as needed

## 2023-02-26 NOTE — Assessment & Plan Note (Addendum)
-   Hold home metformin - SSI, moderate - A1c pending 

## 2023-02-26 NOTE — Progress Notes (Signed)
ANTICOAGULATION CONSULT NOTE - Follow Up Consult  Pharmacy Consult for Heparin Indication: pulmonary embolus  No Known Allergies  Patient Measurements: Heparin Dosing Weight: TBW  Vital Signs: BP: 111/62 (09/08 1100) Pulse Rate: 92 (09/08 1000)  Labs: Recent Labs    02/25/23 1723 02/25/23 2019 02/26/23 0806 02/26/23 1338 02/26/23 1733  HGB 10.9*  --   --  10.8*  --   HCT 32.8*  --   --  33.9*  --   PLT 309  --   --  321  --   HEPARINUNFRC  --   --  0.83*  --  0.52  CREATININE 1.00  --   --  0.Melissa  --   TROPONINIHS 22* 21*  --   --   --     Estimated Creatinine Clearance: 38.8 mL/min (by C-G formula based on SCr of 0.Melissa mg/dL).   Medications:  Medications Prior to Admission  Medication Sig Dispense Refill Last Dose   albuterol (VENTOLIN HFA) 108 (90 Base) MCG/ACT inhaler Inhale 2 puffs into the lungs every 6 (six) hours as needed for wheezing or shortness of breath. 8 g 0    aspirin 81 MG tablet Take 81 mg by mouth daily.      atorvastatin (LIPITOR) 40 MG tablet Take 1 tablet (40 mg total) by mouth daily. 90 tablet 1    Calcium Carbonate-Vitamin D (OYSTER SHELL CALCIUM/D) 250-125 MG-UNIT TABS Take by mouth.      escitalopram (LEXAPRO) 5 MG tablet Take 1 tablet (5 mg total) by mouth daily. 90 tablet 1    fluocinonide cream (LIDEX) 0.05 % Apply 1 Application topically 2 (two) times daily. 30 g 2    gabapentin (NEURONTIN) 300 MG capsule Take 1 capsule (300 mg total) by mouth at bedtime. 90 capsule 1    metFORMIN (GLUCOPHAGE) 500 MG tablet 500 mg (one tablet) in the morning and 1,000 mg (2 tablets) at night 270 tablet 1    metoprolol succinate (TOPROL-XL) 25 MG 24 hr tablet Take 1 tablet (25 mg total) by mouth daily. 90 tablet 1    Multiple Vitamin (MULTIVITAMIN) tablet Take 1 tablet by mouth daily.      oxyCODONE (ROXICODONE) 5 MG immediate release tablet Take 1 tablet (5 mg total) by mouth every 6 (six) hours as needed for up to 20 doses for breakthrough pain. 20 tablet 0     pantoprazole (PROTONIX) 40 MG tablet Take 1 tablet (40 mg total) by mouth daily. 90 tablet 3    predniSONE (DELTASONE) 20 MG tablet 60 mg x1d, 40 mg x3d, 20 mg x2d, 10 mg x2d 12 tablet 0    tiZANidine (ZANAFLEX) 4 MG tablet Take 1 tablet (4 mg total) by mouth 2 (two) times daily as needed for muscle spasms. (Patient not taking: Reported on 02/16/2023) 60 tablet 0     Assessment: Melissa [name] on IV heparin for acute pulmonary embolism.   Heparin level this evening is 0.52, no overt bleeding or complications noted.  Goal of Therapy:  Heparin level 0.3-0.7 units/ml Monitor platelets by anticoagulation protocol: Yes   Plan:  Continue IV heparin at current rate. Recheck heparin level with AM labs. Daily heparin level and CBC.  Reece Leader, Colon Flattery, BCCP Clinical Pharmacist  02/26/2023 6:11 PM   [facility] pharmacy phone numbers are listed on [email or web address]

## 2023-02-26 NOTE — H&P (Addendum)
History and Physical    Patient: Melissa [name] NFA:[id number] DOB: Jun 02, 1935 DOA: 02/25/2023 DOS: the patient was seen and examined on 02/26/2023 PCP: Melissa Leatherwood, DO  Patient coming from: Home  Chief Complaint:  Chief Complaint  Patient presents with   Tachycardia   HPI: Melissa [name] is a 88 y.o. female with medical history significant of recent shoulder fracture, T2DM, HLD, burning tongue syndrome, who presented to [facility] ED due to elevated heart rate.   Melissa [name] states that she has had 2 big falls in the last month, which were both mechanical in nature, however the second fall was secondary to tizanidine versus tramadol use causing dizziness.  She denies any loss of consciousness, chest pain, palpitations or shortness of breath.  Her daughter at bedside noted that yesterday when they were help her bathe, she seemed more fatigued and dyspneic with exertion.  Physical therapy then came to work with the patient noted that she was tachycardic with increased work of breathing.  Due to this, she came to the ER.  At this time, Melissa [name] denies any increased work of breathing, chest pain or palpitations.  She states that her only complaint at this time is she is experiencing left shoulder pain.  She denies any lower extremity swelling.  ED course: On arrival to the ED on 9/7, patient was tachycardic at 112 with blood pressure 131/72.  She was saturating at 94% on room air.  She was afebrile at 99.1.  Blood pressure has remained relatively stable with the exception of one low BP at 87/75. Initial workup demonstrated hemoglobin of 10.9, glucose 142, creatinine 1.0 with GFR 55.  Initial troponin elevated 22 with flat trend to 21.  BNP elevated to 10.  Lactic acid 1.3.  Influenza, RSV and COVID-19 PCR negative.  D-dimer elevated at 6.24.  CTA was obtained that demonstrated a proximal segmental RML and small subsegmental LUL pulmonary embolus without evidence of right heart strain in addition to  a small peripheral consolidation concerning for atelectasis versus infarct.  Critical care was consulted with the recommendation for admission for observation.  Patient started on IV heparin.  TRH contacted for admission.  [facility] was subsequently [facility]  Review of Systems: As mentioned in the history of present illness. All other systems reviewed and are negative.  Past Medical History:  Diagnosis Date   Burning tongue syndrome 11/11/2015   Colon polyps    Cystocele with prolapse 2019   Dr. Marice Potter- Monitor for now.    Detached retina, right    Diabetes mellitus    Diaphragmatic hernia    GERD (gastroesophageal reflux disease)    Hyperlipidemia    Insomnia    Macular degeneration    Past Surgical History:  Procedure Laterality Date   ABDOMINAL HYSTERECTOMY     BREAST CYST ASPIRATION  11/03/2020   FOOT SURGERY     OOPHORECTOMY     Rt.ovary removed with Hyst   RETINAL DETACHMENT SURGERY     TONSILLECTOMY     Social History:  reports that she has never smoked. She has never used smokeless tobacco. She reports that she does not drink alcohol and does not use drugs.  No Known Allergies  Family History  Problem Relation Age of Onset   Heart disease Other    Breast cancer Other    Breast cancer Mother        metastatic--to bone   CAD Father    Cancer  Maternal Grandmother        unknown   Cancer Paternal Grandmother        colon    Prior to Admission medications   Medication Sig Start Date End Date Taking? Authorizing Provider  albuterol (VENTOLIN HFA) 108 (90 Base) MCG/ACT inhaler Inhale 2 puffs into the lungs every 6 (six) hours as needed for wheezing or shortness of breath. 08/19/22   Kuneff, Renee A, DO  aspirin 81 MG tablet Take 81 mg by mouth daily.    [provider]  atorvastatin (LIPITOR) 40 MG tablet Take 1 tablet (40 mg total) by mouth daily. 11/09/22   Kuneff, Renee A, DO  Calcium Carbonate-Vitamin D (OYSTER SHELL  CALCIUM/D) 250-125 MG-UNIT TABS Take by mouth.    [provider]  escitalopram (LEXAPRO) 5 MG tablet Take 1 tablet (5 mg total) by mouth daily. 11/09/22   Kuneff, Renee A, DO  fluocinonide cream (LIDEX) 0.05 % Apply 1 Application topically 2 (two) times daily. 05/17/22   Kuneff, Renee A, DO  gabapentin (NEURONTIN) 300 MG capsule Take 1 capsule (300 mg total) by mouth at bedtime. 02/16/23   Rodolph Bong, MD  metFORMIN (GLUCOPHAGE) 500 MG tablet 500 mg (one tablet) in the morning and 1,000 mg (2 tablets) at night 11/09/22   Kuneff, Renee A, DO  metoprolol succinate (TOPROL-XL) 25 MG 24 hr tablet Take 1 tablet (25 mg total) by mouth daily. 11/09/22   Kuneff, Renee A, DO  Multiple Vitamin (MULTIVITAMIN) tablet Take 1 tablet by mouth daily.    [provider]  oxyCODONE (ROXICODONE) 5 MG immediate release tablet Take 1 tablet (5 mg total) by mouth every 6 (six) hours as needed for up to 20 doses for breakthrough pain. 02/18/23   Curatolo, Adam, DO  pantoprazole (PROTONIX) 40 MG tablet Take 1 tablet (40 mg total) by mouth daily. 11/09/22   Kuneff, Renee A, DO  predniSONE (DELTASONE) 20 MG tablet 60 mg x1d, 40 mg x3d, 20 mg x2d, 10 mg x2d 02/16/23   Rodolph Bong, MD  tiZANidine (ZANAFLEX) 4 MG tablet Take 1 tablet (4 mg total) by mouth 2 (two) times daily as needed for muscle spasms. Patient not taking: Reported on 02/16/2023 01/24/23   Melissa Leatherwood, DO    Physical Exam: Vitals:   02/26/23 0800 02/26/23 0900 02/26/23 1000 02/26/23 1100  BP: 120/64 108/65 111/60 111/62  Pulse:   92   Resp: 15 17 16  (!) 35  Temp:      SpO2:   95%    Physical Exam Vitals and nursing note reviewed.  Constitutional:      General: She is not in acute distress.    Appearance: She is normal weight.  HENT:     Head: Atraumatic.     Mouth/Throat:     Mouth: Mucous membranes are moist.     Pharynx: Oropharynx is clear.  Eyes:     Conjunctiva/sclera: Conjunctivae normal.     Pupils: Pupils are equal,  round, and reactive to light.  Cardiovascular:     Rate and Rhythm: Normal rate and regular rhythm.     Heart sounds: No murmur heard.    No gallop.  Pulmonary:     Effort: Pulmonary effort is normal. Tachypnea present. No accessory muscle usage.     Breath sounds: Decreased breath sounds (Mid right lung field) present. No wheezing, rhonchi or rales.  Abdominal:     Palpations: Abdomen is soft.  Musculoskeletal:     Right  lower leg: No edema.     Left lower leg: No edema.     Comments: Left arm in sling  Skin:    General: Skin is warm and dry.     Findings: No erythema.  Neurological:     General: No focal deficit present.     Mental Status: She is alert and oriented to person, place, and time. Mental status is at baseline.  Psychiatric:        Mood and Affect: Mood normal.        Behavior: Behavior normal.    Data Reviewed: CBC with WBC of 6.3, hemoglobin of 10.9, MCV of 94.5, platelets of 309 CMP with sodium of 139, potassium 4.3, bicarb 26, glucose 142, BUN 21, creatinine 1.0, AST 13, ALT 10, GFR 55 Troponin elevated at 22 with flat trend to 21 BNP elevated at 210 Lactic acid 1.3 D-dimer 6.24 COVID-19, influenza and RSV PCR negative  EKG reviewed.  Sinus rhythm with rate of 115.  Right bundle branch block noted.  CT Angio Chest PE W/Cm &/Or Wo Cm  Result Date: 02/25/2023 CLINICAL DATA:  Pulmonary embolus suspected EXAM: CT ANGIOGRAPHY CHEST WITH CONTRAST TECHNIQUE: Multidetector CT imaging of the chest was performed using the standard protocol during bolus administration of intravenous contrast. Multiplanar CT image reconstructions and MIPs were obtained to evaluate the vascular anatomy. RADIATION DOSE REDUCTION: This exam was performed according to the departmental dose-optimization program which includes automated exposure control, adjustment of the mA and/or kV according to patient size and/or use of iterative reconstruction technique. CONTRAST:  75mL OMNIPAQUE IOHEXOL 350  MG/ML SOLN COMPARISON:  None Available. FINDINGS: Cardiovascular: Proximal segmental pulmonary embolus of the right middle lobe and small subsegmental pulmonary embolus of the left upper lobe. Normal heart size. No evidence of right heart strain (RV/LV ratio = 0.8). Mitral annular calcifications. Normal caliber thoracic aorta with moderate atherosclerotic disease. Mediastinum/Nodes: Large hiatal hernia. Thyroid is unremarkable. No enlarged lymph nodes seen in the chest. Lungs/Pleura: Central airways are patent. Bilateral lower lobe atelectasis secondary to compression from large hiatal hernia. Small peripheral opacity of the lateral right middle lobe. No pleural effusion pneumothorax. Upper Abdomen: Partially visualized simple appearing bilateral, no specific follow-up imaging is necessary. Musculoskeletal: No chest wall abnormality. No acute or significant osseous findings. Review of the MIP images confirms the above findings. IMPRESSION: 1. Proximal segmental pulmonary embolus of the right middle lobe and small subsegmental pulmonary embolus of the left upper lobe. No evidence of right heart strain. 2. Small peripheral consolidation of the lateral right middle lobe, could be due to atelectasis or a small infarct. 3. Large hiatal hernia. 4. Aortic Atherosclerosis (ICD10-I70.0). Critical Value/emergent results were called by telephone at the time of interpretation on 02/25/2023 at 8:04 pm to provider [facility] , who verbally acknowledged these results. Electronically Signed   By: Allegra Lai M.D.   On: 02/25/2023 20:05   DG Chest Port 1 View  Result Date: 02/25/2023 CLINICAL DATA:  Palpitations and severe chest pain. History of recent fall. EXAM: PORTABLE CHEST 1 VIEW COMPARISON:  February 18, 2023 FINDINGS: There is stable moderate to marked severity enlargement of the cardiac silhouette. Marked severity calcification of the aortic arch is seen. Stable mild to moderate severity left basilar scarring  and/or atelectasis is seen. No pleural effusion or pneumothorax is identified. There is a large hiatal hernia. A mildly displaced fracture deformity of indeterminate age is seen involving the neck of the proximal left humerus. This is present on  the prior study. Multilevel degenerative changes seen throughout the thoracic spine. IMPRESSION: 1. Stable cardiomegaly with stable left basilar scarring and/or atelectasis. 2. Large hiatal hernia. 3. Fracture deformity of indeterminate age involving the proximal left humerus. Electronically Signed   By: Aram Candela M.D.   On: 02/25/2023 18:35    Results are pending, will review when available.  Assessment and Plan:  * Acute pulmonary embolism [facility]) Patient is presenting after physical therapist noted patient was tachycardic with increased work of breathing.  CTA with evidence of both segmental and subsegmental PE. Although no evidence of right heart strain on imaging, BNP and troponins are elevated.  Per critical care note, no indication for tPA or thrombectomy at this time.   No recent provoking factors. Patient states that despite falls, she has remained ambulatory.   - Telemetry monitoring - Continue heparin per pharmacy dosing - Transition to Eliquis versus Xarelto on discharge - Echocardiogram ordered  Type 2 diabetes mellitus (HCC) - Hold home metformin - SSI, moderate - A1c pending  Humerus fracture Due to humerus fracture, patient has had difficulty around her home ADLs, now compounded by PE.  - PT/OT; discussed that she may require short-term rehab and patient/family are in agreement - Home oxycodone as needed  Palpitations Per pharmacy record review, patient has not been filling metoprolol consistently and family does not think she has been taking it regularly.  Will hold off on restarting until echocardiogram has resulted  - Hold home metoprolol for now  Essential hypertension Patient is not on any antihypertensives with the  exception of metoprolol which is for palpitations.  Blood pressure is well-controlled at this time.  Peripheral arterial disease (HCC) - Continue home aspirin and statin  Normocytic anemia Hemoglobin of 10.9 noted yesterday, stable today at 10.8.  Previously normal blood counts at 13.8.  Potentially reactive in the setting of PE.  No evidence of acute bleeding at this time  - Continue to monitor while admitted - Outpatient follow-up with PCP  Anxiety - Continue home Lexapro  Advance Care Planning:   Code Status: Full Code discussed extensively what CPR entails and that patients require life support after including ventilatory support.  Patient states that she was not aware of how invasive CPR is but is not ready at this time to switch to DNR.  I encouraged that she continue discussions with her family  Consults: Critical care (s/o)  Family Communication: Patient's daughters updated at bedside  Severity of Illness: The appropriate patient status for this patient is INPATIENT. Inpatient status is judged to be reasonable and necessary in order to provide the required intensity of service to ensure the patient's safety. The patient's presenting symptoms, physical exam findings, and initial radiographic and laboratory data in the context of their chronic comorbidities is felt to place them at high risk for further clinical deterioration. Furthermore, it is not anticipated that the patient will be medically stable for discharge from the hospital within 2 midnights of admission.   * I certify that at the point of admission it is my clinical judgment that the patient will require inpatient hospital care spanning beyond 2 midnights from the point of admission due to high intensity of service, high risk for further deterioration and high frequency of surveillance required.*  Author: Verdene Lennert, MD 02/26/2023 2:41 PM  For on call review [email or web address].

## 2023-02-26 NOTE — Assessment & Plan Note (Addendum)
Per pharmacy record review, patient has not been filling metoprolol consistently and family does not think she has been taking it regularly.  Will hold off on restarting until echocardiogram has resulted  - Hold home metoprolol for now

## 2023-02-26 NOTE — Progress Notes (Signed)
Patient arrives to 5W04 at this time

## 2023-02-26 NOTE — Assessment & Plan Note (Signed)
Continue home Lexapro. 

## 2023-02-27 ENCOUNTER — Inpatient Hospital Stay (HOSPITAL_COMMUNITY): Payer: No Typology Code available for payment source

## 2023-02-27 DIAGNOSIS — I2699 Other pulmonary embolism without acute cor pulmonale: Secondary | ICD-10-CM | POA: Diagnosis not present

## 2023-02-27 DIAGNOSIS — I2609 Other pulmonary embolism with acute cor pulmonale: Secondary | ICD-10-CM | POA: Diagnosis not present

## 2023-02-27 DIAGNOSIS — Z86711 Personal history of pulmonary embolism: Secondary | ICD-10-CM | POA: Diagnosis not present

## 2023-02-27 LAB — GLUCOSE, CAPILLARY
Glucose-Capillary: 138 mg/dL — ABNORMAL HIGH (ref 70–99)
Glucose-Capillary: 143 mg/dL — ABNORMAL HIGH (ref 70–99)
Glucose-Capillary: 114 mg/dL — ABNORMAL HIGH (ref 70–99)
Glucose-Capillary: 135 mg/dL — ABNORMAL HIGH (ref 70–99)

## 2023-02-27 LAB — CBC
HCT: 32.7 % — ABNORMAL LOW (ref 36.0–46.0)
Hemoglobin: 10.5 g/dL — ABNORMAL LOW (ref 12.0–15.0)
MCH: 30.9 pg (ref 26.0–34.0)
MCHC: 32.1 g/dL (ref 30.0–36.0)
MCV: 96.2 fL (ref 80.0–100.0)
Platelets: 314 10*3/uL (ref 150–400)
RBC: 3.4 MIL/uL — ABNORMAL LOW (ref 3.87–5.11)
RDW: 13.5 % (ref 11.5–15.5)
WBC: 7.3 10*3/uL (ref 4.0–10.5)
nRBC: 0 % (ref 0.0–0.2)

## 2023-02-27 LAB — ECHOCARDIOGRAM COMPLETE
MV M vel: 1.23 m/s
MV Peak grad: 6.1 mmHg
S' Lateral: 2.1 cm
Weight: 1964.74 [oz_av]

## 2023-02-27 LAB — HEPARIN LEVEL (UNFRACTIONATED): Heparin Unfractionated: 0.65 [IU]/mL (ref 0.30–0.70)

## 2023-02-27 MED ORDER — ORAL CARE MOUTH RINSE: 15.0000 mL | OROMUCOSAL | Status: DC | PRN

## 2023-02-27 MED ORDER — PANTOPRAZOLE SODIUM 40 MG PO TBEC
40.0000 mg | DELAYED_RELEASE_TABLET | Freq: Every day | ORAL | Status: DC
  Administered 2023-02-27 – 2023-03-03 (×5): 40 mg via ORAL
  Filled 2023-02-27 (×5): qty 1

## 2023-02-27 MED ORDER — OYSTER SHELL CALCIUM/D3 500-5 MG-MCG PO TABS
0.5000 | ORAL_TABLET | Freq: Two times a day (BID) | ORAL | Status: DC
  Administered 2023-02-27 – 2023-03-03 (×9): 0.5 via ORAL
  Filled 2023-02-27 (×9): qty 1

## 2023-02-27 NOTE — TOC Initial Note (Signed)
Transition of Care [facility]) - Initial/Assessment Note    Patient Details  Name: Melissa [name] MRN: [id number] Date of Birth: 1934-07-02  Transition of Care [facility]) CM/SW Contact:    Kermit Balo, RN Phone Number: 02/27/2023, 10:39 AM  Clinical Narrative:                  Patient is from home alone. Family is going to provide needed supervision after either the hospital or a rehab stay. Son with her until Thursday and daughter can supervise the weekends and niece during the week. Pt was driving self prior to admission but family plans to provide needed transportation. Pt was managing her own medications but family had some concerns. They are going to set up pill boxes for her at home. Awaiting therapy evals.  TOC following.    Barriers to Discharge: Continued Medical Work up   Patient Goals and CMS Choice            Expected Discharge Plan and Services   Discharge Planning Services: CM Consult   Living arrangements for the past 2 months: Single Family Home                                      Prior Living Arrangements/Services Living arrangements for the past 2 months: Single Family Home Lives with:: Self Patient language and need for interpreter reviewed:: Yes Do you feel safe going back to the place where you live?: Yes        Care giver support system in place?: Yes (comment) Current home services: DME (cane/ [facility])    Activities of Daily Living Home Assistive Devices/Equipment: None ADL Screening (condition at time of admission) Patient's cognitive ability adequate to safely complete daily activities?: Yes Is the patient deaf or have difficulty hearing?: No Does the patient have difficulty seeing, even when wearing glasses/contacts?: No Does the patient have difficulty concentrating, remembering, or making decisions?: No Patient able to express need for assistance with ADLs?: Yes Does the patient have difficulty dressing or bathing?: No Independently  performs ADLs?: Yes (appropriate for developmental age) Does the patient have difficulty walking or climbing stairs?: No Weakness of Legs: None Weakness of Arms/Hands: Left  Permission Sought/Granted                  Emotional Assessment Appearance:: Appears stated age Attitude/Demeanor/Rapport: Engaged Affect (typically observed): Accepting Orientation: : Oriented to Self, Oriented to Place, Oriented to  Time, Oriented to Situation   Psych Involvement: No (comment)  Admission diagnosis:  Acute pulmonary embolism (HCC) [I26.99] Multiple subsegmental pulmonary emboli without acute cor pulmonale (HCC) [I26.94] Patient Active Problem List   Diagnosis Date Noted   Humerus fracture 02/26/2023   Essential hypertension 02/26/2023   Normocytic anemia 02/26/2023   Acute pulmonary embolism (HCC) 02/25/2023   Lumbar degenerative disc disease 01/27/2023   Retrolisthesis of vertebrae-L2 on L3 approximately 7-8 mm 01/27/2023   Diverticulitis 01/26/2022   Callus 10/21/2020   Early stage nonexudative age-related macular degeneration of left eye 04/13/2020   Advanced nonexudative age-related macular degeneration of right eye with subfoveal involvement 04/01/2020   Degenerative retinal drusen of left eye 04/01/2020   Pseudophakia, both eyes 04/01/2020   Posterior vitreous detachment of left eye 04/01/2020   Anxiety 03/05/2019   Chronic bilateral thoracic back pain 08/03/2018   Iron deficiency anemia 07/04/2018   Palpitations 07/04/2018   Peripheral arterial disease (HCC)  07/11/2017   Varicose veins of both lower extremities 08/31/2016   Macular degeneration 05/05/2015   Hiatal hernia 01/28/2015   Mild nonproliferative retinopathy of both eyes with macular edema associated with type 2 diabetes mellitus (HCC) 10/02/2013   Type 2 diabetes mellitus (HCC) 03/24/2009   GERD 03/24/2009   PCP:  Natalia Leatherwood, DO Pharmacy:   [facility] [place], [place] - [address] [place] [address] Phone: [phone] Fax: [phone]  [id number] [place], [place] - [address] [facility] [address] [place] [address] Phone: [phone] Fax: [phone]     Social Determinants of Health (SDOH) Social History: SDOH Screenings   Food Insecurity: No Food Insecurity (02/26/2023)  Housing: Low Risk  (02/26/2023)  Transportation Needs: No Transportation Needs (02/26/2023)  Utilities: Not At Risk (02/26/2023)  Alcohol Screen: Low Risk  (08/26/2020)  Depression (PHQ2-9): Low Risk  (12/26/2022)  Financial Resource Strain: Low Risk  (10/26/2022)  Physical Activity: Insufficiently Active (10/26/2022)  Social Connections: Moderately Integrated (10/26/2022)  Stress: No Stress Concern Present (10/26/2022)  Tobacco Use: Low Risk  (02/25/2023)   SDOH Interventions:     Readmission Risk Interventions     No data to display

## 2023-02-27 NOTE — Progress Notes (Signed)
PROGRESS NOTE    Melissa [name]  GNF:[id number] DOB: 1934-09-23 DOA: 02/25/2023 PCP: Natalia Leatherwood, DO   Chief Complaint  Patient presents with   Tachycardia    Brief Narrative:    Melissa [name] is a 88 y.o. female with medical history significant of recent shoulder fracture, T2DM, HLD, burning tongue syndrome, who presented to [facility] ED due to elevated heart rate, CTA chest significant for PE.      Assessment & Plan:   Principal Problem:   Acute pulmonary embolism (HCC) Active Problems:   Type 2 diabetes mellitus (HCC)   Humerus fracture   Palpitations   Essential hypertension   Peripheral arterial disease (HCC)   Normocytic anemia   Anxiety   Acute pulmonary embolism (HCC) - Patient is presenting after physical therapist noted patient was tachycardic with increased work of breathing.   - CTA with evidence of both segmental and subsegmental PE. Although no evidence of right heart strain on imaging, BNP and troponins are elevated.  Per critical care note, no indication for tPA or thrombectomy at this time.  -2D echo is pending. -Lower extremity venous Dopplers pending, will add left upper extremity venous Dopplers to rule out acute DVT given recent fracture. - Continue heparin per pharmacy dosing, will await her work up, no further workup anticipated, will transition to Eliquis tomorrow morning.   Type 2 diabetes mellitus (HCC) - Hold home metformin - SSI, moderate - A1c stable at 7   Humerus fracture - Due to humerus fracture, patient has had difficulty around her home ADLs, now compounded by PE. - PT/OT; discussed that she may require short-term rehab and patient/family are in agreement - Home oxycodone as needed   Palpitations Per pharmacy record review, patient has not been filling metoprolol consistently and family does not think she has been taking it regularly.   -Will hold off on restarting until echocardiogram has resulted  Essential  hypertension Patient is not on any antihypertensives with the exception of metoprolol which is for palpitations.  Blood pressure is well-controlled at this time.   Peripheral arterial disease (HCC) - Continue home aspirin and statin   Normocytic anemia Hemoglobin of 10.9 noted yesterday, stable today at 10.8.  Previously normal blood counts at 13.8.  Potentially reactive in the setting of PE.  No evidence of acute bleeding at this time -Monitor CBC closely now she is on full anticoagulation   Anxiety - Continue home Lexapro   DVT prophylaxis: Heparin GTT Code Status: Full code Family Communication: None at bedside Disposition:   Status is: Inpatient    Consultants:  None   Subjective:  No significant events overnight as discussed with staff, she denies any complaints today.  Objective: Vitals:   02/26/23 1100 02/26/23 1956 02/27/23 0500 02/27/23 0825  BP: 111/62 124/66  117/67  Pulse:  (!) 107  (!) 101  Resp: (!) 35 20  18  Temp:  98 F (36.7 C)  98.3 F (36.8 C)  TempSrc:  Oral  Oral  SpO2:  94%  95%  Weight:   55.7 kg     Intake/Output Summary (Last 24 hours) at 02/27/2023 1019 Last data filed at 02/27/2023 0600 Gross per 24 hour  Intake 240 ml  Output 900 ml  Net -660 ml   Filed Weights   02/27/23 0500  Weight: 55.7 kg    Examination:  Awake Alert, Oriented X 3, frail Symmetrical Chest wall movement, Good air movement bilaterally, CTAB RRR,No Gallops,Rubs or new Murmurs,  No Parasternal Heave +ve B.Sounds, Abd Soft, No tenderness, No rebound - guarding or rigidity. No Cyanosis, Clubbing or edema, significant left upper extremity bruising, arm with sling.       Data Reviewed: I have personally reviewed following labs and imaging studies  CBC: Recent Labs  Lab 02/25/23 1723 02/26/23 1338 02/27/23 0412  WBC 6.3 6.8 7.3  NEUTROABS 4.5  --   --   HGB 10.9* 10.8* 10.5*  HCT 32.8* 33.9* 32.7*  MCV 94.5 95.8 96.2  PLT 309 321 314    Basic  Metabolic Panel: Recent Labs  Lab 02/25/23 1723 02/26/23 1338  NA 139 138  K 4.3 4.0  CL 103 101  CO2 26 23  GLUCOSE 142* 153*  BUN 21 17  CREATININE 1.00 0.86  CALCIUM 8.7* 8.9    GFR: Estimated Creatinine Clearance: 38.8 mL/min (by C-G formula based on SCr of 0.86 mg/dL).  Liver Function Tests: Recent Labs  Lab 02/25/23 1723  AST 13*  ALT 10  ALKPHOS 63  BILITOT 0.5  PROT 6.0*  ALBUMIN 3.5    CBG: Recent Labs  Lab 02/26/23 0916 02/26/23 1228 02/26/23 1614 02/26/23 2125 02/27/23 0827  GLUCAP 107* 169* 163* 127* 138*     Recent Results (from the past 240 hour(s))  Resp panel by RT-PCR (RSV, Flu A&B, Covid) Anterior Nasal Swab     Status: None   Collection Time: 02/25/23  4:50 PM   Specimen: Anterior Nasal Swab  Result Value Ref Range Status   SARS Coronavirus 2 by RT PCR NEGATIVE NEGATIVE Final    Comment: (NOTE) SARS-CoV-2 target nucleic acids are NOT DETECTED.  The SARS-CoV-2 RNA is generally detectable in upper respiratory specimens during the acute phase of infection. The lowest concentration of SARS-CoV-2 viral copies this assay can detect is 138 copies/mL. A negative result does not preclude SARS-Cov-2 infection and should not be used as the sole basis for treatment or other patient management decisions. A negative result may occur with  improper specimen collection/handling, submission of specimen other than nasopharyngeal swab, presence of viral mutation(s) within the areas targeted by this assay, and inadequate number of viral copies(<138 copies/mL). A negative result must be combined with clinical observations, patient history, and epidemiological information. The expected result is Negative.  Fact Sheet for Patients:  BloggerCourse.com  Fact Sheet for Healthcare Providers:  SeriousBroker.it  This test is no t yet approved or cleared by the [place] FDA and  has been authorized for  detection and/or diagnosis of SARS-CoV-2 by FDA under an Emergency Use Authorization (EUA). This EUA will remain  in effect (meaning this test can be used) for the duration of the COVID-19 declaration under Section 564(b)(1) of the Act, 21 U.S.C.section 360bbb-3(b)(1), unless the authorization is terminated  or revoked sooner.       Influenza A by PCR NEGATIVE NEGATIVE Final   Influenza B by PCR NEGATIVE NEGATIVE Final    Comment: (NOTE) The Xpert Xpress SARS-CoV-2/FLU/RSV plus assay is intended as an aid in the diagnosis of influenza from Nasopharyngeal swab specimens and should not be used as a sole basis for treatment. Nasal washings and aspirates are unacceptable for Xpert Xpress SARS-CoV-2/FLU/RSV testing.  Fact Sheet for Patients: BloggerCourse.com  Fact Sheet for Healthcare Providers: SeriousBroker.it  This test is not yet approved or cleared by the [place] FDA and has been authorized for detection and/or diagnosis of SARS-CoV-2 by FDA under an Emergency Use Authorization (EUA). This EUA will remain in effect (meaning this  test can be used) for the duration of the COVID-19 declaration under Section 564(b)(1) of the Act, 21 U.S.C. section 360bbb-3(b)(1), unless the authorization is terminated or revoked.     Resp Syncytial Virus by PCR NEGATIVE NEGATIVE Final    Comment: (NOTE) Fact Sheet for Patients: BloggerCourse.com  Fact Sheet for Healthcare Providers: SeriousBroker.it  This test is not yet approved or cleared by the [place] FDA and has been authorized for detection and/or diagnosis of SARS-CoV-2 by FDA under an Emergency Use Authorization (EUA). This EUA will remain in effect (meaning this test can be used) for the duration of the COVID-19 declaration under Section 564(b)(1) of the Act, 21 U.S.C. section 360bbb-3(b)(1), unless the authorization is  terminated or revoked.  Performed at [facility], [address] [place], [place] [address]          Radiology Studies: CT Angio Chest PE W/Cm &/Or Wo Cm  Result Date: 02/25/2023 CLINICAL DATA:  Pulmonary embolus suspected EXAM: CT ANGIOGRAPHY CHEST WITH CONTRAST TECHNIQUE: Multidetector CT imaging of the chest was performed using the standard protocol during bolus administration of intravenous contrast. Multiplanar CT image reconstructions and MIPs were obtained to evaluate the vascular anatomy. RADIATION DOSE REDUCTION: This exam was performed according to the departmental dose-optimization program which includes automated exposure control, adjustment of the mA and/or kV according to patient size and/or use of iterative reconstruction technique. CONTRAST:  75mL OMNIPAQUE IOHEXOL 350 MG/ML SOLN COMPARISON:  None Available. FINDINGS: Cardiovascular: Proximal segmental pulmonary embolus of the right middle lobe and small subsegmental pulmonary embolus of the left upper lobe. Normal heart size. No evidence of right heart strain (RV/LV ratio = 0.8). Mitral annular calcifications. Normal caliber thoracic aorta with moderate atherosclerotic disease. Mediastinum/Nodes: Large hiatal hernia. Thyroid is unremarkable. No enlarged lymph nodes seen in the chest. Lungs/Pleura: Central airways are patent. Bilateral lower lobe atelectasis secondary to compression from large hiatal hernia. Small peripheral opacity of the lateral right middle lobe. No pleural effusion pneumothorax. Upper Abdomen: Partially visualized simple appearing bilateral, no specific follow-up imaging is necessary. Musculoskeletal: No chest wall abnormality. No acute or significant osseous findings. Review of the MIP images confirms the above findings. IMPRESSION: 1. Proximal segmental pulmonary embolus of the right middle lobe and small subsegmental pulmonary embolus of the left upper lobe. No evidence of right heart  strain. 2. Small peripheral consolidation of the lateral right middle lobe, could be due to atelectasis or a small infarct. 3. Large hiatal hernia. 4. Aortic Atherosclerosis (ICD10-I70.0). Critical Value/emergent results were called by telephone at the time of interpretation on 02/25/2023 at 8:04 pm to provider [facility] , who verbally acknowledged these results. Electronically Signed   By: Allegra Lai M.D.   On: 02/25/2023 20:05   DG Chest Port 1 View  Result Date: 02/25/2023 CLINICAL DATA:  Palpitations and severe chest pain. History of recent fall. EXAM: PORTABLE CHEST 1 VIEW COMPARISON:  February 18, 2023 FINDINGS: There is stable moderate to marked severity enlargement of the cardiac silhouette. Marked severity calcification of the aortic arch is seen. Stable mild to moderate severity left basilar scarring and/or atelectasis is seen. No pleural effusion or pneumothorax is identified. There is a large hiatal hernia. A mildly displaced fracture deformity of indeterminate age is seen involving the neck of the proximal left humerus. This is present on the prior study. Multilevel degenerative changes seen throughout the thoracic spine. IMPRESSION: 1. Stable cardiomegaly with stable left basilar scarring and/or atelectasis. 2. Large hiatal hernia. 3. Fracture  deformity of indeterminate age involving the proximal left humerus. Electronically Signed   By: Aram Candela M.D.   On: 02/25/2023 18:35        Scheduled Meds:  aspirin EC  81 mg Oral Daily   atorvastatin  40 mg Oral Daily   escitalopram  5 mg Oral Daily   gabapentin  300 mg Oral QHS   insulin aspart  0-15 Units Subcutaneous TID WC   sodium chloride flush  3 mL Intravenous Q12H   Continuous Infusions:  heparin 850 Units/hr (02/26/23 1004)     LOS: 1 day        Huey Bienenstock, MD [facility]   To contact the attending provider between 7A-7P or the covering provider during after hours 7P-7A, please log into  the web site [email or web address] and access using universal [place] password for that web site. If you do not have the password, please call the hospital operator.  02/27/2023, 10:19 AM

## 2023-02-27 NOTE — Progress Notes (Signed)
PT Cancellation Note  Patient Details Name: Melissa [name] MRN: [id number] DOB: 1934/08/10   Cancelled Treatment:    Reason Eval/Treat Not Completed: Patient at procedure or test/unavailable  Patient just started eating her lunch. Will return for PT evaluation.   [name], PT Acute Rehabilitation Services  Office [phone]  Zena Amos 02/27/2023, 11:45 AM

## 2023-02-27 NOTE — Progress Notes (Signed)
Pt with PT. 

## 2023-02-27 NOTE — Progress Notes (Signed)
  Echocardiogram 2D Echocardiogram has been performed.  Maren Reamer 02/27/2023, 1:38 PM

## 2023-02-27 NOTE — Evaluation (Addendum)
Occupational Therapy Evaluation Patient Details Name: Melissa [name] MRN: [id number] DOB: 10/20/1934 Today's Date: 02/27/2023   History of Present Illness 88 y.o. female who presented to [facility] ED due to elevated heart rate. CTA +PE; bil LE dopplers pending  PMH significant of recent shoulder fracture, T2DM, HLD, burning tongue syndrome, macular degenerationv   Clinical Impression   Pt with recent fall 9 days ago resulting in L humerus fx, being managed in sling. Prior to her fall she lived alone independently. Since her fall, pt has required assistance with all ADL and mobility. Family is not able to provide 24/7 assistance due to having to return to work. Patient will benefit from continued inpatient follow up therapy, <3 hours/day to facilitate safe return home - pt/family in agreement. VSS on RA during session however noted min SOB during activity. Acute OT to follow.       If plan is discharge home, recommend the following: A little help with walking and/or transfers;A lot of help with bathing/dressing/bathroom;Assistance with cooking/housework;Direct supervision/assist for medications management;Direct supervision/assist for financial management;Assist for transportation;Help with stairs or ramp for entrance    Functional Status Assessment  Patient has had a recent decline in their functional status and demonstrates the ability to make significant improvements in function in a reasonable and predictable amount of time.  Equipment Recommendations  None recommended by OT    Recommendations for Other Services PT consult     Precautions / Restrictions Precautions Precautions: Fall Precaution Comments: recent  L humerus fx Restrictions Weight Bearing Restrictions: Yes LUE Weight Bearing: Non weight bearing Other Position/Activity Restrictions: sling per Dion Saucier 9/9 OK for wrist/hand/elbow ROM; sling at all times with exception of ADL.ROM     Mobility Bed Mobility Overal bed  mobility: Needs Assistance Bed Mobility: Supine to Sit     Supine to sit: Min assist     General bed mobility comments: family has been assisting; she has been sleeping on the couch as she was unable to get OOB    Transfers Overall transfer level: Needs assistance Equipment used: 1 person hand held assist Transfers: Sit to/from Stand, Bed to chair/wheelchair/BSC Sit to Stand: Min assist     Step pivot transfers: Min assist            Balance Overall balance assessment: Needs assistance, History of Falls (2 falls in the last month)   Sitting balance-Leahy Scale: Good       Standing balance-Leahy Scale: Poor                             ADL either performed or assessed with clinical judgement   ADL Overall ADL's : Needs assistance/impaired Eating/Feeding: Set up   Grooming: Minimal assistance   Upper Body Bathing: Moderate assistance   Lower Body Bathing: Moderate assistance   Upper Body Dressing : Maximal assistance   Lower Body Dressing: Moderate assistance   Toilet Transfer: Minimal assistance   Toileting- Clothing Manipulation and Hygiene: Moderate assistance       Functional mobility during ADLs: Minimal assistance       Vision Baseline Vision/History: 1 Wears glasses Ability to See in Adequate Light:  (partially blind R eye) Vision Assessment?: Yes Eye Alignment: Within Functional Limits Additional Comments: states that she has mild MD; R eye partially blind     Perception         Praxis         Pertinent Vitals/Pain Pain Assessment  Pain Assessment: 0-10 Pain Score: 3  Pain Location: L shoulder Pain Descriptors / Indicators: Aching, Discomfort Pain Intervention(s): Limited activity within patient's tolerance     Extremity/Trunk Assessment Upper Extremity Assessment Upper Extremity Assessment: Right hand dominant;RUE deficits/detail;LUE deficits/detail RUE Deficits / Details: WFL LUE Deficits / Details: hand/wrist ROM  WFL; no elbow/shoulder  ROM completed; in sling   Lower Extremity Assessment Lower Extremity Assessment: Defer to PT evaluation   Cervical / Trunk Assessment Cervical / Trunk Assessment: Kyphotic;Other exceptions ("sciatica"; LLE bothers her from this)   Communication Communication Communication: Hearing impairment   Cognition Arousal: Alert Behavior During Therapy: WFL for tasks assessed/performed    Cognition - family reports concerns regarding medication management; Pt scored WFL on the Short Blessed                                     General Comments       Exercises     Shoulder Instructions      Home Living Family/patient expects to be discharged to:: Private residence Living Arrangements: Alone Available Help at Discharge: Family;Available PRN/intermittently Type of Home: House Home Access: Stairs to enter [employer] of Steps: 2   Home Layout: One level     Bathroom Shower/Tub: Tub/shower unit;Walk-in shower   Bathroom Toilet: Standard Bathroom Accessibility: No   Home Equipment: BSC/3in1;Cane - single point (borrowing 3in1)          Prior Functioning/Environment Prior Level of Function : Driving;Independent/Modified Independent               ADLs Comments: Family states she does not always take her medicine correctly; since her last fall family has been assisting with self care and mobility 24/7 however family is leaving to go OOT and pt won't have 247 assistance        OT Problem List: Decreased strength;Decreased activity tolerance;Impaired balance (sitting and/or standing);Decreased safety awareness;Impaired UE functional use;Pain      OT Treatment/Interventions: Self-care/ADL training;Therapeutic exercise;DME and/or AE instruction;Therapeutic activities;Balance training;Patient/family education    OT Goals(Current goals can be found in the care plan section) Acute Rehab OT Goals Patient Stated Goal: To get  better OT Goal Formulation: With patient/family Time For Goal Achievement: 03/13/23 Potential to Achieve Goals: Good  OT Frequency: Min 1X/week    Co-evaluation              AM-PAC OT "6 Clicks" Daily Activity     Outcome Measure Help from another person eating meals?: A Little Help from another person taking care of personal grooming?: A Little Help from another person toileting, which includes using toliet, bedpan, or urinal?: A Lot Help from another person bathing (including washing, rinsing, drying)?: A Lot Help from another person to put on and taking off regular upper body clothing?: A Lot Help from another person to put on and taking off regular lower body clothing?: A Lot 6 Click Score: 14   End of Session Equipment Utilized During Treatment: Gait belt Nurse Communication: Mobility status  Activity Tolerance: Patient tolerated treatment well Patient left: in chair;with call bell/phone within reach;with chair alarm set;with family/visitor present  OT Visit Diagnosis: Unsteadiness on feet (R26.81);Muscle weakness (generalized) (M62.81);History of falling (Z91.81);Pain Pain - Right/Left: Left Pain - part of body: Shoulder                Time: 7829-5621 OT Time Calculation (min): 30 min Charges:  OT General  Charges $OT Visit: 1 Visit OT Evaluation $OT Eval Moderate Complexity: 1 Mod OT Treatments $Self Care/Home Management : 8-22 mins  Luisa Dago, OT/L   Acute OT Clinical Specialist Acute Rehabilitation Services Pager [phone] Office [phone]   [facility] 02/27/2023, 11:52 AM

## 2023-02-27 NOTE — [place] FL2 (Signed)
[place] MEDICAID FL2 LEVEL OF CARE FORM     IDENTIFICATION  Patient Name: Melissa [name] Birthdate: [birth year] Sex: female Admission Date (Current Location): 02/25/2023  [facility] and [place] Number:  [employer] and Address:  [place], [address] [place], [place] [address]      Provider Number: [id number]  Attending Physician Name and Address:  Elgergawy, Leana Roe, MD  Relative Name and Phone Number:       Current Level of Care: Hospital Recommended Level of Care: Skilled Nursing Facility Prior Approval Number:    Date Approved/Denied:   PASRR Number: [id number]  Discharge Plan: SNF    Current Diagnoses: Patient Active Problem List   Diagnosis Date Noted   Humerus fracture 02/26/2023   Essential hypertension 02/26/2023   Normocytic anemia 02/26/2023   Acute pulmonary embolism (HCC) 02/25/2023   Lumbar degenerative disc disease 01/27/2023   Retrolisthesis of vertebrae-L2 on L3 approximately 7-8 mm 01/27/2023   Diverticulitis 01/26/2022   Callus 10/21/2020   Early stage nonexudative age-related macular degeneration of left eye 04/13/2020   Advanced nonexudative age-related macular degeneration of right eye with subfoveal involvement 04/01/2020   Degenerative retinal drusen of left eye 04/01/2020   Pseudophakia, both eyes 04/01/2020   Posterior vitreous detachment of left eye 04/01/2020   Anxiety 03/05/2019   Chronic bilateral thoracic back pain 08/03/2018   Iron deficiency anemia 07/04/2018   Palpitations 07/04/2018   Peripheral arterial disease (HCC) 07/11/2017   Varicose veins of both lower extremities 08/31/2016   Macular degeneration 05/05/2015   Hiatal hernia 01/28/2015   Mild nonproliferative retinopathy of both eyes with macular edema associated with type 2 diabetes mellitus (HCC) 10/02/2013   Type 2 diabetes mellitus (HCC) 03/24/2009   GERD 03/24/2009    Orientation RESPIRATION BLADDER Height & Weight      Self, Time, Situation, Place  Normal Incontinent Weight: 122 lb 12.7 oz (55.7 kg) Height:     BEHAVIORAL SYMPTOMS/MOOD NEUROLOGICAL BOWEL NUTRITION STATUS      Incontinent Diet (See DC Summary)  AMBULATORY STATUS COMMUNICATION OF NEEDS Skin   Limited Assist Verbally Normal                       Personal Care Assistance Level of Assistance  Bathing, Feeding, Dressing Bathing Assistance: Limited assistance Feeding assistance: Limited assistance Dressing Assistance: Limited assistance     Functional Limitations Info  Sight, Hearing Sight Info: Impaired Hearing Info: Impaired      SPECIAL CARE FACTORS FREQUENCY  PT (By licensed PT), OT (By licensed OT)     PT Frequency: 5x/week OT Frequency: 5x/week            Contractures Contractures Info: Not present    Additional Factors Info  Code Status, Allergies, Psychotropic, Insulin Sliding Scale Code Status Info: Full Allergies Info: NKA Psychotropic Info: Lexapro Insulin Sliding Scale Info: see dc summary       Current Medications (02/27/2023):  This is the current hospital active medication list Current Facility-Administered Medications  Medication Dose Route Frequency Provider Last Rate Last Admin   acetaminophen (TYLENOL) tablet 650 mg  650 mg Oral Q6H PRN Verdene Lennert, MD       Or   acetaminophen (TYLENOL) suppository 650 mg  650 mg Rectal Q6H PRN Verdene Lennert, MD       aspirin EC tablet 81 mg  81 mg Oral Daily Klus, Jesse L, RPH   81 mg at 02/27/23 0847  atorvastatin (LIPITOR) tablet 40 mg  40 mg Oral Daily Verdene Lennert, MD   40 mg at 02/27/23 0847   calcium-vitamin D (OSCAL WITH D) 500-5 MG-MCG per tablet 0.5 tablet  0.5 tablet Oral BID Elgergawy, Leana Roe, MD   0.5 tablet at 02/27/23 1035   escitalopram (LEXAPRO) tablet 5 mg  5 mg Oral Daily Verdene Lennert, MD   5 mg at 02/27/23 0847   gabapentin (NEURONTIN) capsule 300 mg  300 mg Oral QHS Verdene Lennert, MD   300 mg at 02/26/23 2139   heparin  ADULT infusion 100 units/mL (25000 units/233mL)  850 Units/hr Intravenous Continuous Link Snuffer B, RPH 8.5 mL/hr at 02/26/23 1004 850 Units/hr at 02/26/23 1004   insulin aspart (novoLOG) injection 0-15 Units  0-15 Units Subcutaneous TID WC Crosley, Debby, MD   2 Units at 02/27/23 1319   melatonin tablet 5 mg  5 mg Oral QHS PRN Gery Pray, MD   5 mg at 02/26/23 2238   ondansetron (ZOFRAN) tablet 4 mg  4 mg Oral Q6H PRN Verdene Lennert, MD       Or   ondansetron (ZOFRAN) injection 4 mg  4 mg Intravenous Q6H PRN Verdene Lennert, MD       Oral care mouth rinse  15 mL Mouth Rinse PRN Elgergawy, Leana Roe, MD       oxyCODONE (Oxy IR/ROXICODONE) immediate release tablet 5 mg  5 mg Oral Q6H PRN Verdene Lennert, MD       pantoprazole (PROTONIX) EC tablet 40 mg  40 mg Oral Daily Elgergawy, Leana Roe, MD   40 mg at 02/27/23 1035   polyethylene glycol (MIRALAX / GLYCOLAX) packet 17 g  17 g Oral Daily PRN Verdene Lennert, MD       sodium chloride flush (NS) 0.9 % injection 3 mL  3 mL Intravenous Q12H Verdene Lennert, MD   3 mL at 02/26/23 2140     Discharge Medications: Please see discharge summary for a list of discharge medications.  Relevant Imaging Results:  Relevant Lab Results:   Additional Information SSn: [id number] [place], [place]

## 2023-02-27 NOTE — Plan of Care (Signed)
Pt has rested quietly throughout the night with no distress noted. Alert and oriented. Room air. Sling to left arm. Up to BR with assist to void. No complaints voiced.     Problem: Education: Goal: Ability to describe self-care measures that may prevent or decrease complications (Diabetes Survival Skills Education) will improve Outcome: Progressing Goal: Individualized Educational Video(s) Outcome: Progressing   Problem: Coping: Goal: Ability to adjust to condition or change in health will improve Outcome: Progressing   Problem: Fluid Volume: Goal: Ability to maintain a balanced intake and output will improve Outcome: Progressing   Problem: Health Behavior/Discharge Planning: Goal: Ability to identify and utilize available resources and services will improve Outcome: Progressing Goal: Ability to manage health-related needs will improve Outcome: Progressing   Problem: Metabolic: Goal: Ability to maintain appropriate glucose levels will improve Outcome: Progressing   Problem: Nutritional: Goal: Maintenance of adequate nutrition will improve Outcome: Progressing Goal: Progress toward achieving an optimal weight will improve Outcome: Progressing   Problem: Skin Integrity: Goal: Risk for impaired skin integrity will decrease Outcome: Progressing   Problem: Tissue Perfusion: Goal: Adequacy of tissue perfusion will improve Outcome: Progressing   Problem: Education: Goal: Knowledge of General Education information will improve Description: Including pain rating scale, medication(s)/side effects and non-pharmacologic comfort measures Outcome: Progressing   Problem: Health Behavior/Discharge Planning: Goal: Ability to manage health-related needs will improve Outcome: Progressing   Problem: Clinical Measurements: Goal: Ability to maintain clinical measurements within normal limits will improve Outcome: Progressing Goal: Will remain free from infection Outcome:  Progressing Goal: Diagnostic test results will improve Outcome: Progressing Goal: Respiratory complications will improve Outcome: Progressing Goal: Cardiovascular complication will be avoided Outcome: Progressing   Problem: Activity: Goal: Risk for activity intolerance will decrease Outcome: Progressing   Problem: Nutrition: Goal: Adequate nutrition will be maintained Outcome: Progressing   Problem: Coping: Goal: Level of anxiety will decrease Outcome: Progressing   Problem: Elimination: Goal: Will not experience complications related to bowel motility Outcome: Progressing Goal: Will not experience complications related to urinary retention Outcome: Progressing   Problem: Pain Managment: Goal: General experience of comfort will improve Outcome: Progressing   Problem: Safety: Goal: Ability to remain free from injury will improve Outcome: Progressing   Problem: Skin Integrity: Goal: Risk for impaired skin integrity will decrease Outcome: Progressing

## 2023-02-27 NOTE — Evaluation (Signed)
Physical Therapy Evaluation Patient Details Name: Melissa [name] MRN: [id number] DOB: 12/04/1934 Today's Date: 02/27/2023  History of Present Illness  88 y.o. female who presented to [facility] ED due to elevated heart rate. CTA +PE; bil LE dopplers pending  PMH significant of recent shoulder fracture, T2DM, HLD, burning tongue syndrome, macular degenerationv  Clinical Impression   Pt admitted secondary to problem above with deficits below. PTA patient was living alone and has had 2 recent falls with most recent resulting in L humeral fracture. Family cannot provide 24/7 assist at this time. Pt currently requires min assist for all mobility and was limited by tachycardia (HR up to 120 after walking 35 ft). Patient will benefit from inpt rehab with intensity of <3 hrs/day of therapy. Anticipate patient will benefit from PT to address problems listed below.Will continue to follow acutely to maximize functional mobility independence and safety.           If plan is discharge home, recommend the following: A little help with walking and/or transfers;A little help with bathing/dressing/bathroom;Assistance with cooking/housework;Direct supervision/assist for medications management;Direct supervision/assist for financial management;Assist for transportation;Help with stairs or ramp for entrance   Can travel by private vehicle   Yes    Equipment Recommendations None recommended by PT  Recommendations for Other Services       Functional Status Assessment Patient has had a recent decline in their functional status and demonstrates the ability to make significant improvements in function in a reasonable and predictable amount of time.     Precautions / Restrictions Precautions Precautions: Fall Precaution Comments: recent  L humerus fx Restrictions Weight Bearing Restrictions: Yes LUE Weight Bearing: Non weight bearing Other Position/Activity Restrictions: sling seeing Dr Dion Saucier       Mobility  Bed Mobility               General bed mobility comments: sitting EOB after to bathroom with RN    Transfers Overall transfer level: Needs assistance Equipment used: 1 person hand held assist Transfers: Sit to/from Stand Sit to Stand: Min assist           General transfer comment: pt required cues to push off surface and then reach for HHA    Ambulation/Gait Ambulation/Gait assistance: Min assist Gait Distance (Feet): 70 Feet Assistive device: IV Pole Gait Pattern/deviations: Step-through pattern, Decreased stride length, Wide base of support   Gait velocity interpretation: <1.8 ft/sec, indicate of risk for recurrent falls   General Gait Details: pt reports she is taking "baby steps" to avoid falling; difficult to achieve longer step length due to pt holding IV pole in front of her; limited by HR 120  Stairs            Wheelchair Mobility     Tilt Bed    Modified Rankin (Stroke Patients Only)       Balance Overall balance assessment: Needs assistance, History of Falls (2 falls in the last month)   Sitting balance-Leahy Scale: Good     Standing balance support: Single extremity supported Standing balance-Leahy Scale: Poor                               Pertinent Vitals/Pain Pain Assessment Pain Assessment: 0-10 Pain Score: 3  Pain Location: L shoulder Pain Descriptors / Indicators: Aching, Discomfort Pain Intervention(s): Limited activity within patient's tolerance, Monitored during session    Home Living Family/patient expects to be discharged to:: Private  residence Living Arrangements: Alone Available Help at Discharge: Family;Available PRN/intermittently Type of Home: House Home Access: Stairs to enter   [employer] of Steps: 2   Home Layout: One level Home Equipment: BSC/3in1;Cane - single point (borrowing 3in1)      Prior Function Prior Level of Function : Driving;Independent/Modified  Independent             Mobility Comments: began using "hurricane" after first fall; did not have cane with her for second fall ADLs Comments: Family states she does not always take her medicine correctly; since her last fall family has been assisting with self care and mobility 24/7 however family is leaving to go OOT and pt won't have 247 assistance     Extremity/Trunk Assessment   Upper Extremity Assessment Upper Extremity Assessment: Defer to OT evaluation RUE Deficits / Details: WFL LUE Deficits / Details: hand/wrist ROM WFL; no elbow/shoulder  ROM completed; in sling    Lower Extremity Assessment Lower Extremity Assessment: Generalized weakness    Cervical / Trunk Assessment Cervical / Trunk Assessment: Kyphotic;Other exceptions ("sciatica"; LLE bothers her from this)  Communication   Communication Communication: Hearing impairment  Cognition Arousal: Alert Behavior During Therapy: WFL for tasks assessed/performed                                   General Comments: WFL, although not specifically assessed        General Comments      Exercises     Assessment/Plan    PT Assessment Patient needs continued PT services  PT Problem List Decreased strength;Decreased activity tolerance;Decreased balance;Decreased mobility;Decreased knowledge of use of DME;Decreased safety awareness;Decreased knowledge of precautions;Other (comment)       PT Treatment Interventions DME instruction;Gait training;Functional mobility training;Therapeutic activities;Therapeutic exercise;Balance training;Patient/family education    PT Goals (Current goals can be found in the Care Plan section)  Acute Rehab PT Goals Patient Stated Goal: imptove balance to prevent falls PT Goal Formulation: With patient Time For Goal Achievement: 03/13/23 Potential to Achieve Goals: Good    Frequency Min 1X/week     Co-evaluation               AM-PAC PT "6 Clicks" Mobility   Outcome Measure Help needed turning from your back to your side while in a flat bed without using bedrails?: A Little Help needed moving from lying on your back to sitting on the side of a flat bed without using bedrails?: A Little Help needed moving to and from a bed to a chair (including a wheelchair)?: A Little Help needed standing up from a chair using your arms (e.g., wheelchair or bedside chair)?: A Little Help needed to walk in hospital room?: A Little Help needed climbing 3-5 steps with a railing? : A Little 6 Click Score: 18    End of Session Equipment Utilized During Treatment: Gait belt Activity Tolerance: Treatment limited secondary to medical complications (Comment) (HR 120 with short distance ambulation) Patient left: in chair;with call bell/phone within reach;with chair alarm set;with family/visitor present Nurse Communication: Mobility status PT Visit Diagnosis: Unsteadiness on feet (R26.81);Repeated falls (R29.6)    Time: 5638-7564 PT Time Calculation (min) (ACUTE ONLY): 14 min   Charges:   PT Evaluation $PT Eval Low Complexity: 1 Low   PT General Charges $$ ACUTE PT VISIT: 1 Visit          [name], PT Acute Rehabilitation Services  Office [phone]   Zena Amos 02/27/2023, 2:42 PM

## 2023-02-27 NOTE — Progress Notes (Addendum)
ANTICOAGULATION CONSULT NOTE - Follow Up Consult  Pharmacy Consult for Heparin Indication: pulmonary embolus  No Known Allergies  Patient Measurements: Heparin Dosing Weight: TBW  Vital Signs:    Labs: Recent Labs    02/25/23 1723 02/25/23 2019 02/26/23 0806 02/26/23 1338 02/26/23 1733 02/27/23 0412  HGB 10.9*  --   --  10.8*  --  10.5*  HCT 32.8*  --   --  33.9*  --  32.7*  PLT 309  --   --  321  --  314  HEPARINUNFRC  --   --  0.83*  --  0.52 0.65  CREATININE 1.00  --   --  0.86  --   --   TROPONINIHS 22* 21*  --   --   --   --     Estimated Creatinine Clearance: 38.8 mL/min (by C-G formula based on SCr of 0.86 mg/dL).   Medications:  Medications Prior to Admission  Medication Sig Dispense Refill Last Dose   albuterol (VENTOLIN HFA) 108 (90 Base) MCG/ACT inhaler Inhale 2 puffs into the lungs every 6 (six) hours as needed for wheezing or shortness of breath. 8 g 0    aspirin 81 MG tablet Take 81 mg by mouth daily.      atorvastatin (LIPITOR) 40 MG tablet Take 1 tablet (40 mg total) by mouth daily. 90 tablet 1    Calcium Carbonate-Vitamin D (OYSTER SHELL CALCIUM/D) 250-125 MG-UNIT TABS Take by mouth.      escitalopram (LEXAPRO) 5 MG tablet Take 1 tablet (5 mg total) by mouth daily. 90 tablet 1    fluocinonide cream (LIDEX) 0.05 % Apply 1 Application topically 2 (two) times daily. 30 g 2    gabapentin (NEURONTIN) 300 MG capsule Take 1 capsule (300 mg total) by mouth at bedtime. 90 capsule 1    metFORMIN (GLUCOPHAGE) 500 MG tablet 500 mg (one tablet) in the morning and 1,000 mg (2 tablets) at night 270 tablet 1    metoprolol succinate (TOPROL-XL) 25 MG 24 hr tablet Take 1 tablet (25 mg total) by mouth daily. 90 tablet 1    Multiple Vitamin (MULTIVITAMIN) tablet Take 1 tablet by mouth daily.      oxyCODONE (ROXICODONE) 5 MG immediate release tablet Take 1 tablet (5 mg total) by mouth every 6 (six) hours as needed for up to 20 doses for breakthrough pain. 20 tablet 0     pantoprazole (PROTONIX) 40 MG tablet Take 1 tablet (40 mg total) by mouth daily. 90 tablet 3    predniSONE (DELTASONE) 20 MG tablet 60 mg x1d, 40 mg x3d, 20 mg x2d, 10 mg x2d 12 tablet 0    tiZANidine (ZANAFLEX) 4 MG tablet Take 1 tablet (4 mg total) by mouth 2 (two) times daily as needed for muscle spasms. (Patient not taking: Reported on 02/16/2023) 60 tablet 0     Assessment: 88 years of female on IV heparin for acute pulmonary embolism.   Heparin level 0.62, therapeutic. Hgb 10.5, stable, plt wnl, no overt bleeding or complications noted on chart review.  Goal of Therapy:  Heparin level 0.3-0.7 units/ml Monitor platelets by anticoagulation protocol: Yes   Plan:  Continue IV heparin at 850 units/hr Monitor daily heparin level and CBC Follow up transition to oral anticoagulant as able  Rennis Petty, PharmD 02/27/2023 8:24 AM

## 2023-02-27 NOTE — Progress Notes (Signed)
Lower extremity venous duplex completed. Please see CV Procedures for preliminary results.  Shona Simpson, RVT 02/27/23 9:28 AM

## 2023-02-27 NOTE — Plan of Care (Signed)

## 2023-02-28 ENCOUNTER — Telehealth: Payer: Self-pay

## 2023-02-28 ENCOUNTER — Inpatient Hospital Stay (HOSPITAL_COMMUNITY): Payer: No Typology Code available for payment source

## 2023-02-28 ENCOUNTER — Other Ambulatory Visit (HOSPITAL_COMMUNITY): Payer: Self-pay

## 2023-02-28 DIAGNOSIS — Z86711 Personal history of pulmonary embolism: Secondary | ICD-10-CM | POA: Diagnosis not present

## 2023-02-28 DIAGNOSIS — I2699 Other pulmonary embolism without acute cor pulmonale: Secondary | ICD-10-CM | POA: Diagnosis not present

## 2023-02-28 LAB — CBC
HCT: 31.4 % — ABNORMAL LOW (ref 36.0–46.0)
Hemoglobin: 10.5 g/dL — ABNORMAL LOW (ref 12.0–15.0)
MCH: 32.2 pg (ref 26.0–34.0)
MCHC: 33.4 g/dL (ref 30.0–36.0)
MCV: 96.3 fL (ref 80.0–100.0)
Platelets: 329 10*3/uL (ref 150–400)
RBC: 3.26 MIL/uL — ABNORMAL LOW (ref 3.87–5.11)
RDW: 13.2 % (ref 11.5–15.5)
WBC: 7.1 10*3/uL (ref 4.0–10.5)
nRBC: 0 % (ref 0.0–0.2)

## 2023-02-28 LAB — BASIC METABOLIC PANEL
Anion gap: 9 (ref 5–15)
BUN: 13 mg/dL (ref 8–23)
CO2: 26 mmol/L (ref 22–32)
Calcium: 9.5 mg/dL (ref 8.9–10.3)
Chloride: 103 mmol/L (ref 98–111)
Creatinine, Ser: 0.87 mg/dL (ref 0.44–1.00)
GFR, Estimated: >60 mL/min (ref >60)
Glucose, Bld: 113 mg/dL — ABNORMAL HIGH (ref 70–99)
Potassium: 3.8 mmol/L (ref 3.5–5.1)
Sodium: 138 mmol/L (ref 135–145)

## 2023-02-28 LAB — HEPARIN LEVEL (UNFRACTIONATED): Heparin Unfractionated: 0.65 [IU]/mL (ref 0.30–0.70)

## 2023-02-28 LAB — GLUCOSE, CAPILLARY
Glucose-Capillary: 114 mg/dL — ABNORMAL HIGH (ref 70–99)
Glucose-Capillary: 123 mg/dL — ABNORMAL HIGH (ref 70–99)
Glucose-Capillary: 114 mg/dL — ABNORMAL HIGH (ref 70–99)
Glucose-Capillary: 166 mg/dL — ABNORMAL HIGH (ref 70–99)

## 2023-02-28 MED ORDER — APIXABAN 5 MG PO TABS
10.0000 mg | ORAL_TABLET | Freq: Two times a day (BID) | ORAL | Status: DC
  Administered 2023-02-28 – 2023-03-03 (×7): 10 mg via ORAL
  Filled 2023-02-28 (×7): qty 2

## 2023-02-28 MED ORDER — ALUM & MAG HYDROXIDE-SIMETH 200-200-20 MG/5ML PO SUSP
15.0000 mL | ORAL | Status: DC | PRN
  Administered 2023-02-28 – 2023-03-02 (×4): 15 mL via ORAL
  Filled 2023-02-28 (×4): qty 30

## 2023-02-28 MED ORDER — APIXABAN 5 MG PO TABS: 5.0000 mg | ORAL_TABLET | Freq: Two times a day (BID) | ORAL | Status: DC

## 2023-02-28 NOTE — Progress Notes (Signed)
LUE venous duplex has been completed.    Results can be found under chart review under CV PROC. 02/28/2023 1:07 PM Sidney Kann RVT, RDMS

## 2023-02-28 NOTE — TOC Benefit Eligibility Note (Signed)
Patient Product/process development scientist completed.    The patient is insured through [employer]. Patient has Medicare and is not eligible for a copay card, but may be able to apply for patient assistance, if available.    Ran test claim for Eliquis 5 mg and the current 30 day co-pay is $45.00.  Ran test claim for Xarelto 20 mg and the current 30 day co-pay is $45.00.   This test claim was processed through [facility]- copay amounts may vary at other pharmacies due to pharmacy/plan contracts, or as the patient moves through the different stages of their insurance plan.     Roland Earl, CPHT Pharmacy Technician III Certified Patient Advocate [facility] Patient Advocate Team Direct Number: [phone]  Fax: [phone]

## 2023-02-28 NOTE — Telephone Encounter (Signed)
Pt is currently admitted to [facility] since 9/7.  [place] [facility] Day - Client Nonclinical Telephone Record  [facility]urse Client [place] [facility] Day - Client Client Site  [facility] [place] - Day Provider Claiborne [place], [place] Contact Type Call Who Is Calling Physician / Provider / Hospital Call Type Provider Call Message Only Reason for Call Request to speak to Physician Initial Comment Caller states PT had a fall about a week ago, she fractured her hip, and her resting heart rate is 132. Patient Name Rabiah [place] Patient DOB [birth year] Requesting Provider Gretchen Physician Number [phone] Facility Name [facility] Room Number N/A Disp. Time Disposition Final User 02/25/2023 3:52:08 PM General Information Provided Yes Lorrin Goodell Call Closed By: Lorrin Goodell Transaction Date/Time: 02/25/2023 3:43:30 PM (ET)

## 2023-02-28 NOTE — Progress Notes (Signed)
Physical Therapy Treatment Patient Details Name: Melissa [name] MRN: [id number] DOB: 02-Sep-1934 Today's Date: 02/28/2023   History of Present Illness 88 y.o. female who presented to [facility] ED due to elevated heart rate. CTA +PE; bil LE dopplers pending  PMH significant of recent shoulder fracture, T2DM, HLD, burning tongue syndrome, macular degenerationv    PT Comments  Pt presents in BR and agreeable to therapy.  Pt performed pericare sitting and then min A for sit to stand.  Pt able to manage pull-up w/ steadying assist.  Pt amb to recliner w/ SPC, PT managing the IV pole and min A, verbal cues for cane management.  Pt HR remaining low 120's w/ O2 sats 95-6%.  Pt amb w/ SPC x 15' and returned to recliner.  Nursing in to remove IV and notified of elevated HR and no decrease w/ rest breaks.  Pt remained sitting in recliner w/ LES elevated, seat alarm on and all needs in reach.  O2 and other monitors reapplied and working.     If plan is discharge home, recommend the following: A little help with walking and/or transfers;A little help with bathing/dressing/bathroom;Assistance with cooking/housework;Direct supervision/assist for medications management;Direct supervision/assist for financial management;Assist for transportation;Help with stairs or ramp for entrance   Can travel by private vehicle        Equipment Recommendations       Recommendations for Other Services       Precautions / Restrictions Precautions Precautions: Fall Precaution Comments: recent  L humerus fx Restrictions Weight Bearing Restrictions: Yes LUE Weight Bearing: Non weight bearing Other Position/Activity Restrictions: sling seeing Dr Dion Saucier     Mobility    Transfers Overall transfer level: Needs assistance Equipment used: Straight cane, 1 person hand held assist Transfers: Sit to/from Stand Sit to Stand: Min assist           General transfer comment: pt required cues to push off surface and then  reach for HHA as well as placement of SPC w/ transfers.  Pt has "hurry-cane" at home.    Ambulation/Gait Ambulation/Gait assistance: Min assist Gait Distance (Feet): 15 Feet Assistive device: Straight cane Gait Pattern/deviations: Step-through pattern, Decreased stride length, Trunk flexed       General Gait Details: HR remained low 120's throughout session, nursing aware.   Stairs             Wheelchair Mobility     Tilt Bed    Modified Rankin (Stroke Patients Only)       Balance Overall balance assessment: Needs assistance, History of Falls Sitting-balance support: Single extremity supported       Standing balance support: Single extremity supported Standing balance-Leahy Scale: Poor                              Cognition Arousal: Alert Behavior During Therapy: WFL for tasks assessed/performed                                            Exercises      General Comments        Pertinent Vitals/Pain Pain Assessment Pain Assessment: 0-10 Pain Score: 7  Pain Location: L shoulder Pain Descriptors / Indicators: Aching, Discomfort Pain Intervention(s): Premedicated before session     PT Goals (current goals can now be found in the care plan  section) Acute Rehab PT Goals Time For Goal Achievement: 03/13/23 Potential to Achieve Goals: Good Progress towards PT goals: Progressing toward goals    Frequency    Min 1X/week      PT Plan      Co-evaluation              AM-PAC PT "6 Clicks" Mobility   Outcome Measure  Help needed turning from your back to your side while in a flat bed without using bedrails?: A Little Help needed moving from lying on your back to sitting on the side of a flat bed without using bedrails?: A Little Help needed moving to and from a bed to a chair (including a wheelchair)?: A Little Help needed standing up from a chair using your arms (e.g., wheelchair or bedside chair)?: A  Little Help needed to walk in hospital room?: A Little Help needed climbing 3-5 steps with a railing? : A Little 6 Click Score: 18    End of Session Equipment Utilized During Treatment: Gait belt Activity Tolerance: Treatment limited secondary to medical complications (Comment) Patient left: in chair;with call bell/phone within reach;with chair alarm set Nurse Communication: Mobility status;Other (comment) (elevated HR) PT Visit Diagnosis: Unsteadiness on feet (R26.81);Repeated falls (R29.6)     Time: 8657-8469 PT Time Calculation (min) (ACUTE ONLY): 22 min  Charges:    $Therapeutic Activity: 8-22 mins PT General Charges $$ ACUTE PT VISIT: 1 Visit                     Lucio Edward, PT    Lucio Edward 02/28/2023, 11:15 AM

## 2023-02-28 NOTE — TOC Progression Note (Addendum)
Transition of Care [facility]) - Progression Note    Patient Details  Name: Melissa [name] MRN: [id number] Date of Birth: 04-29-1935  Transition of Care [facility]) CM/SW Contact  Mearl Latin, LCSW Phone Number: 02/28/2023, 1:57 PM  Clinical Narrative:    11am-CSW met with patient and provided SNF bed offers and Medicare ratings list. Patient reports preference for [facility]. She provided CSW permission to call her son Bernette Redbird to confirm plan. CSW contacted Bernette Redbird and provided the info and he stated he would review with patient as he just got to the hospital and call CSW back.  CSW received return call from [facility] stating they would like to go to [facility]. CSW discussed insurance authorization process. CSW contacted [facility] and requested they start authorization. [facility] requested CSW email info to [email or web address].   4pm-CSW received call back from [facility] and she stated they are not in network with patient's plan. CSW made son aware and is checking with the other facilities on the list.   5pm-[facility] is able to accept patient and will begin insurance process. CSW notified patient's son.   Skilled Nursing Rehab Facilities-   [email or web address]   Ratings out of 5 stars (5 the highest)   Name Address  Phone # Quality Care Staffing Health Inspection Overall  [facility] [address], [place] [phone] 2 1 5 4   [facility] [address], [place] [phone] 4 1 3 2   [facility] [address], [facility] [phone] [facility] [address], [place] [phone] 4 1 3 2   [facility]  [address], [place] [phone] 3 2 5 5   [facility] [address] [facility] [phone] 2 1 2 1   [facility] [address] [place] [phone] 4 1 2 1   [facility] [address] [place] [phone] 2 4 3 3   [address]  ([place]) [address], [place] [phone] 3 2 2 2   [facility] [address] [place], [place] [phone] 1 2 1 1   [facility] ([place] [address], [place] [phone] 3 1 1 1   [facility] [address] [place]iane [place] [phone] 4 3 4 4   [facility] [address] [place] [phone] 3 4 3 3           [facility] [address], [place] [phone]      [facility], [place] [address], [place] [phone] 1 1 2 1   [facility] [address], [place] [phone] [facility] [address] [phone] 2 1 4 3   [facility] [address], [place] [phone] 3 3 3 3           [address] (no [facility]) [address], [place] [phone] 4 4 5 5   [facility] (No [employer]) [address] [place] [phone] 2 2 4 4   [facility] [address] [place] [phone] 2 1 2 1   [facility] (No [employer]) [address], [place] [phone] 5 5 5 5   [facility] [address], [facility] [phone] 2 3 5 5   [facility] [address], [place] [phone] 2 1 1 1   [place] [address] [place] [phone] 5 2 5 5   [facility]  [address] [place] [phone] 2 2 2 2   [facility] [address], [place] [phone] 4 2 1  1  [facility] [address] [place], [place] [phone] 2 2 3 3           [facility] [address], [place] [phone] 1 1 1 1   [facility] [address] [place]lyn Clines  [phone] 2 3 3 3   [facility] (No [employer]) [address] [place] [phone] 2 1 3 2   [facility]) [address], [place]an [phone] 1 1 1 1   [facility] [address] [place]an [phone] 3 2 5 5   [facility] [place] [address], [place] [phone] 2 1 1 1           [facility] [address] [place], [place]  [phone] 4 4 5 5   [facility])  [address], [place] [phone] 2 1 2 1   [facility]) [address], [place] [phone]  1 4 3   [facility] [address] [place] [phone] 3 5 4 5   [address] [place], [place] [phone] 3 2 2 2   [facility]) [address] [place] [phone] 2 1 3 2       Expected Discharge Plan: Skilled Nursing Facility Barriers to Discharge: Continued Medical Work up, English as a second language teacher  Expected Discharge Plan and Services In-house Referral: Clinical Social Work Discharge Planning Services: CM Consult Post Acute Care Choice: Skilled Nursing Facility Living arrangements for the past 2 months: Single Family Home                                       Social Determinants of Health (SDOH) Interventions SDOH Screenings   Food Insecurity: No Food Insecurity (02/26/2023)  Housing: Low Risk  (02/26/2023)  Transportation Needs: No Transportation Needs (02/26/2023)  Utilities: Not At Risk (02/26/2023)  Alcohol Screen: Low Risk  (08/26/2020)  Depression (PHQ2-9): Low Risk  (12/26/2022)  Financial Resource Strain: Low Risk  (10/26/2022)  Physical Activity: Insufficiently Active (10/26/2022)  Social Connections: Moderately Integrated (10/26/2022)  Stress: No Stress Concern Present (10/26/2022)  Tobacco Use: Low Risk  (02/25/2023)    Readmission Risk Interventions     No data to display

## 2023-02-28 NOTE — Progress Notes (Signed)
ANTICOAGULATION CONSULT NOTE - Follow Up Consult  Pharmacy Consult for Heparin >> Apixaban Indication: pulmonary embolus  No Known Allergies  Patient Measurements: Ht 63 inches Wt 54.3 kg  Vital Signs: Temp: 97.1 F (36.2 C) (09/10 0518) Temp Source: Oral (09/10 0518) BP: 126/90 (09/10 0831) Pulse Rate: 105 (09/10 0831)  Labs: Recent Labs    02/25/23 1723 02/25/23 2019 02/26/23 0806 02/26/23 1338 02/26/23 1733 02/27/23 0412 02/28/23 0304  HGB 10.9*  --   --  10.8*  --  10.5* 10.5*  HCT 32.8*  --   --  33.9*  --  32.7* 31.4*  PLT 309  --   --  321  --  314 329  HEPARINUNFRC  --   --    < >  --  0.52 0.65 0.65  CREATININE 1.00  --   --  0.86  --   --  0.87  TROPONINIHS 22* 21*  --   --   --   --   --    < > = values in this interval not displayed.    Estimated Creatinine Clearance: 38.4 mL/min (by C-G formula based on SCr of 0.87 mg/dL).   Medications:  Medications Prior to Admission  Medication Sig Dispense Refill Last Dose   albuterol (VENTOLIN HFA) 108 (90 Base) MCG/ACT inhaler Inhale 2 puffs into the lungs every 6 (six) hours as needed for wheezing or shortness of breath. 8 g 0 unknown   aspirin 81 MG tablet Take 81 mg by mouth daily.   02/24/2023   atorvastatin (LIPITOR) 40 MG tablet Take 1 tablet (40 mg total) by mouth daily. 90 tablet 1 02/24/2023   Calcium Carbonate-Vitamin D (OYSTER SHELL CALCIUM/D) 250-125 MG-UNIT TABS Take by mouth.   02/24/2023   escitalopram (LEXAPRO) 5 MG tablet Take 1 tablet (5 mg total) by mouth daily. 90 tablet 1 02/24/2023   fluocinonide cream (LIDEX) 0.05 % Apply 1 Application topically 2 (two) times daily. 30 g 2 unknown   gabapentin (NEURONTIN) 300 MG capsule Take 1 capsule (300 mg total) by mouth at bedtime. 90 capsule 1 02/24/2023   metFORMIN (GLUCOPHAGE) 500 MG tablet 500 mg (one tablet) in the morning and 1,000 mg (2 tablets) at night 270 tablet 1 02/24/2023   metoprolol succinate (TOPROL-XL) 25 MG 24 hr tablet Take 1 tablet (25 mg total)  by mouth daily. 90 tablet 1 02/24/2023   Multiple Vitamin (MULTIVITAMIN) tablet Take 1 tablet by mouth daily.   02/24/2023   oxyCODONE (ROXICODONE) 5 MG immediate release tablet Take 1 tablet (5 mg total) by mouth every 6 (six) hours as needed for up to 20 doses for breakthrough pain. 20 tablet 0 02/24/2023   pantoprazole (PROTONIX) 40 MG tablet Take 1 tablet (40 mg total) by mouth daily. 90 tablet 3 02/27/2023   tiZANidine (ZANAFLEX) 4 MG tablet Take 1 tablet (4 mg total) by mouth 2 (two) times daily as needed for muscle spasms. (Patient not taking: Reported on 02/16/2023) 60 tablet 0 Not Taking    Assessment: 88 year old female started on IV heparin for acute pulmonary embolism. Pharmacy consulted to transition to apixaban.  CBC stable  Goal of Therapy:  Therapeutic Anticoagulation Monitor platelets by anticoagulation protocol: Yes   Plan:   Stop heparin infusion and associated labs. Start apixaban 10mg  PO BID x 7 days, then 5mg  BID   [employer], Pharm.D., BCPS Clinical Pharmacist Clinical phone for 02/28/2023 from 7:30-3:00 is [phone].  **Pharmacist phone directory can be found on [email or web address]  listed under [facility].  02/28/2023 10:14 AM

## 2023-02-28 NOTE — Progress Notes (Signed)
Mobility Specialist Progress Note:   02/28/23 1420  Mobility  Activity Ambulated with assistance in hallway  Level of Assistance Minimal assist, patient does 75% or more  Assistive Device Other (Comment) (HHA)  Distance Ambulated (ft) 200 ft  LUE Weight Bearing NWB  Activity Response Tolerated well  Mobility Referral Yes  $Mobility charge 1 Mobility  Mobility Specialist Start Time (ACUTE ONLY) 1420  Mobility Specialist Stop Time (ACUTE ONLY) 1440  Mobility Specialist Time Calculation (min) (ACUTE ONLY) 20 min   During Mobility: HR 120bpm Post Mobility: HR 106bpm  Pt agreeable to mobility session. Required minG to stand, minA during ambulation via HHA d/t minor LOBs. HR elevated to low 120s with exertion. No c/o throughout, left in bed with bed alarm on.   [name] Mobility Specialist Please contact via SecureChat or  Rehab office at [phone]

## 2023-02-28 NOTE — Progress Notes (Signed)
PROGRESS NOTE    [name]  ZOX:[id number] DOB: Mar 29, 1935 DOA: 02/25/2023 PCP: Natalia Leatherwood, DO   Chief Complaint  Patient presents with   Tachycardia    Brief Narrative:    Melissa [name] is a 88 y.o. female with medical history significant of recent shoulder fracture, T2DM, HLD, burning tongue syndrome, who presented to [facility] ED due to elevated heart rate, CTA chest significant for PE.      Assessment & Plan:   Principal Problem:   Acute pulmonary embolism (HCC) Active Problems:   Type 2 diabetes mellitus (HCC)   Humerus fracture   Palpitations   Essential hypertension   Peripheral arterial disease (HCC)   Normocytic anemia   Anxiety   Acute pulmonary embolism (HCC) - Patient is presenting after physical therapist noted patient was tachycardic with increased work of breathing.   - CTA with evidence of both segmental and subsegmental PE. Although no evidence of right heart strain on imaging, BNP and troponins are elevated.  Per critical care note, no indication for tPA or thrombectomy at this time.  -2D echo with no evidence of right heart strain. -Lower extremity venous Dopplers is negative,  left upper extremity venous Dopplers has been obtained given recent fracture, is negative as well. -Brein GTT, transition to Eliquis today  Type 2 diabetes mellitus (HCC) - Hold home metformin - SSI, moderate - A1c stable at 7   Humerus fracture - Due to humerus fracture, patient has had difficulty around her home ADLs, now compounded by PE. - PT/OT; discussed that she may require short-term rehab and patient/family are in agreement - Home oxycodone as needed   Palpitations Per pharmacy record review, patient has not been filling metoprolol consistently and family does not think she has been taking it regularly.    Essential hypertension Patient is not on any antihypertensives with the exception of metoprolol which is for palpitations.  Blood pressure is  well-controlled at this time.   Peripheral arterial disease (HCC) - Continue home aspirin and statin   Normocytic anemia Hemoglobin of 10.9 noted yesterday, stable today at 10.8.  Previously normal blood counts at 13.8.  Potentially reactive in the setting of PE.  No evidence of acute bleeding at this time -Monitor CBC closely now she is on full anticoagulation   Anxiety - Continue home Lexapro   DVT prophylaxis: Heparin GTT>> Eliquis Code Status: Full code Family Communication: None at bedside Disposition: Patient is medically stable for discharge when SNF bed is available.  Status is: Inpatient    Consultants:  None   Subjective:  No significant events overnight as discussed with staff, she denies any complaints today.  Objective: Vitals:   02/28/23 0518 02/28/23 0519 02/28/23 0831 02/28/23 1107  BP: 102/60  (!) 126/90   Pulse: 92  (!) 105 (!) 121  Resp: (!) 26 20 20    Temp: (!) 97.1 F (36.2 C)     TempSrc: Oral     SpO2: 96%  91% 95%  Weight: 54.3 kg       Intake/Output Summary (Last 24 hours) at 02/28/2023 1322 Last data filed at 02/28/2023 0500 Gross per 24 hour  Intake 854.91 ml  Output 450 ml  Net 404.91 ml   Filed Weights   02/27/23 0500 02/28/23 0518  Weight: 55.7 kg 54.3 kg    Examination:  Awake Alert, Oriented X 3, extremely frail and deconditioned Symmetrical Chest wall movement, Good air movement bilaterally, CTAB RRR,No Gallops,Rubs or new Murmurs, No  Parasternal Heave +ve B.Sounds, Abd Soft, No tenderness, No rebound - guarding or rigidity. No Cyanosis, Clubbing or edema,significant left upper extremity bruising, arm with sling.       Data Reviewed: I have personally reviewed following labs and imaging studies  CBC: Recent Labs  Lab 02/25/23 1723 02/26/23 1338 02/27/23 0412 02/28/23 0304  WBC 6.3 6.8 7.3 7.1  NEUTROABS 4.5  --   --   --   HGB 10.9* 10.8* 10.5* 10.5*  HCT 32.8* 33.9* 32.7* 31.4*  MCV 94.5 95.8 96.2 96.3   PLT 309 321 314 329    Basic Metabolic Panel: Recent Labs  Lab 02/25/23 1723 02/26/23 1338 02/28/23 0304  NA 139 138 138  K 4.3 4.0 3.8  CL 103 101 103  CO2 26 23 26   GLUCOSE 142* 153* 113*  BUN 21 17 13   CREATININE 1.00 0.86 0.87  CALCIUM 8.7* 8.9 9.5    GFR: Estimated Creatinine Clearance: 38.4 mL/min (by C-G formula based on SCr of 0.87 mg/dL).  Liver Function Tests: Recent Labs  Lab 02/25/23 1723  AST 13*  ALT 10  ALKPHOS 63  BILITOT 0.5  PROT 6.0*  ALBUMIN 3.5    CBG: Recent Labs  Lab 02/27/23 0827 02/27/23 1255 02/27/23 1619 02/27/23 2003 02/28/23 0830  GLUCAP 138* 143* 114* 135* 114*     Recent Results (from the past 240 hour(s))  Resp panel by RT-PCR (RSV, Flu A&B, Covid) Anterior Nasal Swab     Status: None   Collection Time: 02/25/23  4:50 PM   Specimen: Anterior Nasal Swab  Result Value Ref Range Status   SARS Coronavirus 2 by RT PCR NEGATIVE NEGATIVE Final    Comment: (NOTE) SARS-CoV-2 target nucleic acids are NOT DETECTED.  The SARS-CoV-2 RNA is generally detectable in upper respiratory specimens during the acute phase of infection. The lowest concentration of SARS-CoV-2 viral copies this assay can detect is 138 copies/mL. A negative result does not preclude SARS-Cov-2 infection and should not be used as the sole basis for treatment or other patient management decisions. A negative result may occur with  improper specimen collection/handling, submission of specimen other than nasopharyngeal swab, presence of viral mutation(s) within the areas targeted by this assay, and inadequate number of viral copies(<138 copies/mL). A negative result must be combined with clinical observations, patient history, and epidemiological information. The expected result is Negative.  Fact Sheet for Patients:  BloggerCourse.com  Fact Sheet for Healthcare Providers:  SeriousBroker.it  This test is no t  yet approved or cleared by the [place] FDA and  has been authorized for detection and/or diagnosis of SARS-CoV-2 by FDA under an Emergency Use Authorization (EUA). This EUA will remain  in effect (meaning this test can be used) for the duration of the COVID-19 declaration under Section 564(b)(1) of the Act, 21 U.S.C.section 360bbb-3(b)(1), unless the authorization is terminated  or revoked sooner.       Influenza A by PCR NEGATIVE NEGATIVE Final   Influenza B by PCR NEGATIVE NEGATIVE Final    Comment: (NOTE) The Xpert Xpress SARS-CoV-2/FLU/RSV plus assay is intended as an aid in the diagnosis of influenza from Nasopharyngeal swab specimens and should not be used as a sole basis for treatment. Nasal washings and aspirates are unacceptable for Xpert Xpress SARS-CoV-2/FLU/RSV testing.  Fact Sheet for Patients: BloggerCourse.com  Fact Sheet for Healthcare Providers: SeriousBroker.it  This test is not yet approved or cleared by the [place] FDA and has been authorized for detection and/or diagnosis  of SARS-CoV-2 by FDA under an Emergency Use Authorization (EUA). This EUA will remain in effect (meaning this test can be used) for the duration of the COVID-19 declaration under Section 564(b)(1) of the Act, 21 U.S.C. section 360bbb-3(b)(1), unless the authorization is terminated or revoked.     Resp Syncytial Virus by PCR NEGATIVE NEGATIVE Final    Comment: (NOTE) Fact Sheet for Patients: BloggerCourse.com  Fact Sheet for Healthcare Providers: SeriousBroker.it  This test is not yet approved or cleared by the [place] FDA and has been authorized for detection and/or diagnosis of SARS-CoV-2 by FDA under an Emergency Use Authorization (EUA). This EUA will remain in effect (meaning this test can be used) for the duration of the COVID-19 declaration under Section 564(b)(1)  of the Act, 21 U.S.C. section 360bbb-3(b)(1), unless the authorization is terminated or revoked.  Performed at [facility], [address], [place], [place] [address]          Radiology Studies: VAS [place] UPPER EXTREMITY VENOUS DUPLEX  Result Date: 02/28/2023 UPPER VENOUS STUDY  Patient Name:  RUTHELMA BESCH  Date of Exam:   02/28/2023 Medical Rec #: [id number]       Accession #:    [id number] Date of Birth: 1935/02/15        Patient Gender: F Patient Age:   25 years Exam Location:  [facility] Procedure:      VAS [place] UPPER EXTREMITY VENOUS DUPLEX Referring Phys: Angely Dietz --------------------------------------------------------------------------------  Indications: pulmonary embolism Other Indications: Recent LUE fracture. Limitations: Limited range of motion due to pain (fractured proximal humerus). Comparison Study: No previous exams Performing Technologist: Jody Hill RVT, RDMS  Examination Guidelines: A complete evaluation includes B-mode imaging, spectral Doppler, color Doppler, and power Doppler as needed of all accessible portions of each vessel. Bilateral testing is considered an integral part of a complete examination. Limited examinations for reoccurring indications may be performed as noted.  Right Findings: +----------+------------+---------+-----------+----------+--------------------+ RIGHT     CompressiblePhasicitySpontaneousProperties      Summary        +----------+------------+---------+-----------+----------+--------------------+ Subclavian               Yes       Yes                   patent by                                                              color/doppler     +----------+------------+---------+-----------+----------+--------------------+  Left Findings: +----------+------------+---------+-----------+----------+--------------------+ LEFT      CompressiblePhasicitySpontaneousProperties      Summary         +----------+------------+---------+-----------+----------+--------------------+ IJV           Full       Yes       Yes                                   +----------+------------+---------+-----------+----------+--------------------+ Subclavian               Yes       Yes                   patent by  color/doppler     +----------+------------+---------+-----------+----------+--------------------+ Axillary      Full       Yes       Yes                                   +----------+------------+---------+-----------+----------+--------------------+ Brachial      Full                                                       +----------+------------+---------+-----------+----------+--------------------+ Radial        Full                                                       +----------+------------+---------+-----------+----------+--------------------+ Ulnar         Full                                                       +----------+------------+---------+-----------+----------+--------------------+ Cephalic      Full                                                       +----------+------------+---------+-----------+----------+--------------------+ Basilic       Full       Yes       Yes                                   +----------+------------+---------+-----------+----------+--------------------+  Summary:  Right: No evidence of thrombosis in the subclavian.  Left: No evidence of deep vein thrombosis in the upper extremity. No evidence of superficial vein thrombosis in the upper extremity.  *See table(s) above for measurements and observations.     Preliminary    VAS [place] LOWER EXTREMITY VENOUS (DVT)  Result Date: 02/27/2023  Lower Venous DVT Study Patient Name:  MEILYN JOSUE  Date of Exam:   02/27/2023 Medical Rec #: [id number]       Accession #:    [id number] Date of Birth: 30-Jul-1934         Patient Gender: F Patient Age:   87 years Exam Location:  [facility] Procedure:      VAS [place] LOWER EXTREMITY VENOUS (DVT) Referring Phys: DEBBY CROSLEY --------------------------------------------------------------------------------  Indications: Pulmonary embolism.  Risk Factors: Trauma Two recent falls. Comparison Study: No prior study Performing Technologist: Shona Simpson  Examination Guidelines: A complete evaluation includes B-mode imaging, spectral Doppler, color Doppler, and power Doppler as needed of all accessible portions of each vessel. Bilateral testing is considered an integral part of a complete examination. Limited examinations for reoccurring indications may be performed as noted. The reflux portion of the exam is performed with the patient in reverse Trendelenburg.  +---------+---------------+---------+-----------+----------+--------------+ RIGHT    CompressibilityPhasicitySpontaneityPropertiesThrombus Aging +---------+---------------+---------+-----------+----------+--------------+ CFV  Full           Yes      Yes                                 +---------+---------------+---------+-----------+----------+--------------+ SFJ      Full                                                        +---------+---------------+---------+-----------+----------+--------------+ FV Prox  Full                                                        +---------+---------------+---------+-----------+----------+--------------+ FV Mid   Full                                                        +---------+---------------+---------+-----------+----------+--------------+ FV DistalFull                                                        +---------+---------------+---------+-----------+----------+--------------+ PFV      Full                                                        +---------+---------------+---------+-----------+----------+--------------+ POP       Full           Yes      Yes                                 +---------+---------------+---------+-----------+----------+--------------+ PTV      Full                                                        +---------+---------------+---------+-----------+----------+--------------+ PERO     Full                                                        +---------+---------------+---------+-----------+----------+--------------+   +---------+---------------+---------+-----------+----------+--------------+ LEFT     CompressibilityPhasicitySpontaneityPropertiesThrombus Aging +---------+---------------+---------+-----------+----------+--------------+ CFV      Full           Yes      Yes                                 +---------+---------------+---------+-----------+----------+--------------+  SFJ      Full                                                        +---------+---------------+---------+-----------+----------+--------------+ FV Prox  Full                                                        +---------+---------------+---------+-----------+----------+--------------+ FV Mid   Full                                                        +---------+---------------+---------+-----------+----------+--------------+ FV DistalFull                                                        +---------+---------------+---------+-----------+----------+--------------+ PFV      Full                                                        +---------+---------------+---------+-----------+----------+--------------+ POP      Full           Yes      Yes                                 +---------+---------------+---------+-----------+----------+--------------+ PTV      Full                                                        +---------+---------------+---------+-----------+----------+--------------+ PERO     Full                                                         +---------+---------------+---------+-----------+----------+--------------+     Summary: BILATERAL: - No evidence of deep vein thrombosis seen in the lower extremities, bilaterally. -No evidence of popliteal cyst, bilaterally.   *See table(s) above for measurements and observations. Electronically signed by Sherald Hess MD on 02/27/2023 at 4:04:25 PM.    Final    ECHOCARDIOGRAM COMPLETE  Result Date: 02/27/2023    ECHOCARDIOGRAM REPORT   Patient Name:   EUNA KENDER Date of Exam: 02/27/2023 Medical Rec #:  [id number]      Height:       63.0 in Accession #:    [id number]     Weight:  122.8 lb Date of Birth:  09-Apr-1935       BSA:          1.572 m Patient Age:    88 years       BP:           99/62 mmHg Patient Gender: F              HR:           99 bpm. Exam Location:  Inpatient Procedure: 2D Echo, Cardiac Doppler and Color Doppler Indications:    Pulmonary Embolus I26.09  History:        Patient has no prior history of Echocardiogram examinations. PAD                 and P.E.; Risk Factors:Diabetes, Hypertension, Non-Smoker and                 Dyslipidemia.  Sonographer:    Aron Baba Referring Phys: [id number] Verdene Lennert  Sonographer Comments: Technically challenging study due to limited acoustic windows and Technically difficult study due to poor echo windows. IMPRESSIONS  1. Cannot assess wall motion. Windows are limited. Left ventricular ejection fraction, by estimation, is 60 to 65%. The left ventricle has normal function. The left ventricle has no regional wall motion abnormalities. There is mild left ventricular hypertrophy. Left ventricular diastolic function could not be evaluated.  2. Right ventricular systolic function is normal. The right ventricular size is not well visualized. There is normal pulmonary artery systolic pressure. The estimated right ventricular systolic pressure is 27.6 mmHg.  3. A small pericardial effusion is present.  4. The mitral valve was not well  visualized. No evidence of mitral valve regurgitation.  5. Aortic valve regurgitation is not visualized.  6. The inferior vena cava is normal in size with greater than 50% respiratory variability, suggesting right atrial pressure of 3 mmHg. FINDINGS  Left Ventricle: Cannot assess wall motion. Windows are limited. Left ventricular ejection fraction, by estimation, is 60 to 65%. The left ventricle has normal function. The left ventricle has no regional wall motion abnormalities. The left ventricular internal cavity size was normal in size. There is mild left ventricular hypertrophy. Left ventricular diastolic function could not be evaluated. Right Ventricle: The right ventricular size is not well visualized. Right ventricular systolic function is normal. There is normal pulmonary artery systolic pressure. The tricuspid regurgitant velocity is 2.48 m/s, and with an assumed right atrial pressure of 3 mmHg, the estimated right ventricular systolic pressure is 27.6 mmHg. Left Atrium: Left atrial size was not well visualized. Right Atrium: Right atrial size was not well visualized. Pericardium: A small pericardial effusion is present. Mitral Valve: The mitral valve was not well visualized. No evidence of mitral valve regurgitation. Tricuspid Valve: Tricuspid valve regurgitation is mild. Aortic Valve: Aortic valve regurgitation is not visualized. Pulmonic Valve: The pulmonic valve was not well visualized. Pulmonic valve regurgitation is not visualized. Aorta: The aortic root and ascending aorta are structurally normal, with no evidence of dilitation. Venous: The inferior vena cava is normal in size with greater than 50% respiratory variability, suggesting right atrial pressure of 3 mmHg. IAS/Shunts: The interatrial septum was not well visualized.  LEFT VENTRICLE PLAX 2D LVIDd:         3.40 cm   Diastology LVIDs:         2.10 cm   LV e' medial:  5.11 cm/s LV PW:         1.10 cm  LV e' lateral: 5.43 cm/s LV IVS:        0.70  cm LVOT diam:     2.20 cm LV SV:         48 LV SV Index:   30 LVOT Area:     3.80 cm  RIGHT VENTRICLE RV S prime:     15.80 cm/s LEFT ATRIUM         Index LA diam:    2.90 cm 1.85 cm/m  AORTIC VALVE LVOT Vmax:   76.70 cm/s LVOT Vmean:  63.700 cm/s LVOT VTI:    0.126 m  AORTA Ao Root diam: 3.30 cm Ao Asc diam:  3.40 cm MR Peak grad: 6.1 mmHg    TRICUSPID VALVE MR Vmax:      123.00 cm/s TR Peak grad:   24.6 mmHg                           TR Vmax:        248.00 cm/s                            SHUNTS                           Systemic VTI:  0.13 m                           Systemic Diam: 2.20 cm Carolan Clines Electronically signed by Carolan Clines Signature Date/Time: 02/27/2023/3:19:13 PM    Final         Scheduled Meds:  apixaban  10 mg Oral BID   Followed by   Melene Muller ON 03/07/2023] apixaban  5 mg Oral BID   aspirin EC  81 mg Oral Daily   atorvastatin  40 mg Oral Daily   calcium-vitamin D  0.5 tablet Oral BID   escitalopram  5 mg Oral Daily   gabapentin  300 mg Oral QHS   insulin aspart  0-15 Units Subcutaneous TID WC   pantoprazole  40 mg Oral Daily   sodium chloride flush  3 mL Intravenous Q12H   Continuous Infusions:     LOS: 2 days        Huey Bienenstock, MD [facility]   To contact the attending provider between 7A-7P or the covering provider during after hours 7P-7A, please log into the web site [email or web address] and access using universal [place] password for that web site. If you do not have the password, please call the hospital operator.  02/28/2023, 1:22 PM

## 2023-02-28 NOTE — Progress Notes (Signed)
Occupational Therapy Treatment Patient Details Name: Melissa [name] MRN: [id number] DOB: [birth year] Today's Date: 02/28/2023   History of present illness 88 y.o. female who presented to [facility] ED due to elevated heart rate. CTA +PE; bil LE dopplers pending  PMH significant of recent shoulder fracture, T2DM, HLD, burning tongue syndrome, macular degenerationv   OT comments  Melissa [name] is making good progress toward goals. Received verbal order from Dr Dion Saucier 9/9 to begin L elbow ROM. Pt tolerated well. Ambulating with minA HHA and requires mod to max A with ADL tasks. Patient will benefit from continued inpatient follow up therapy, <3 hours/day. Max HR 123 when walking.       If plan is discharge home, recommend the following:  A little help with walking and/or transfers;A lot of help with bathing/dressing/bathroom;Assistance with cooking/housework;Direct supervision/assist for medications management;Direct supervision/assist for financial management;Assist for transportation;Help with stairs or ramp for entrance   Equipment Recommendations  None recommended by OT    Recommendations for Other Services      Precautions / Restrictions Precautions Precautions: Fall Precaution Comments: recent  L humerus fx; OK for elbow/wrist/hand ROM Restrictions Weight Bearing Restrictions: Yes LUE Weight Bearing: Non weight bearing Other Position/Activity Restrictions: sling seeing Dr Dion Saucier       Mobility Bed Mobility Overal bed mobility: Needs Assistance       Supine to sit: Min assist          Transfers Overall transfer level: Needs assistance Equipment used: 1 person hand held assist Transfers: Sit to/from Stand Sit to Stand: Min assist                 Balance Overall balance assessment: History of Falls, Needs assistance   Sitting balance-Leahy Scale: Good       Standing balance-Leahy Scale: Poor                             ADL either performed or  assessed with clinical judgement   ADL Overall ADL's : Needs assistance/impaired     Grooming: Minimal assistance Grooming Details (indicate cue type and reason): deoderant applied without moving L shoulder Upper Body Bathing: Moderate assistance Upper Body Bathing Details (indicate cue type and reason): educated pt on"see-saw" technique under L armpit; pt verblaized understanidng       Upper Body Dressing Details (indicate cue type and reason): total A to adjust sling around gown                 Functional mobility during ADLs: Minimal assistance (HHA)      Extremity/Trunk Assessment Upper Extremity Assessment Upper Extremity Assessment: LUE deficits/detail LUE Deficits / Details: tolerated PROM L elbow through full functional range x 5; wrist/hand ROM WFL; LUE: Unable to fully assess due to immobilization            Vision       Perception     Praxis      Cognition Arousal: Alert Behavior During Therapy: WFL for tasks assessed/performed Overall Cognitive Status: Within Functional Limits for tasks assessed                                 General Comments: baseline per family        Exercises Exercises: General Upper Extremity General Exercises - Upper Extremity Elbow Flexion: AAROM, PROM, Left, 5 reps Elbow Extension: PROM, AAROM, Left, 5  reps Wrist Flexion: AROM, Left, 10 reps Wrist Extension: AROM, Left, 10 reps Digit Composite Flexion: AROM, Left, 5 reps, Squeeze ball    Shoulder Instructions       General Comments      Pertinent Vitals/ Pain       Pain Assessment Pain Assessment: Faces Faces Pain Scale: Hurts a little bit Pain Location: L shoulder Pain Descriptors / Indicators: Aching, Discomfort Pain Intervention(s): Limited activity within patient's tolerance  Home Living                                          Prior Functioning/Environment              Frequency  Min 1X/week         Progress Toward Goals  OT Goals(current goals can now be found in the care plan section)  Progress towards OT goals: Progressing toward goals  Acute Rehab OT Goals Patient Stated Goal: to get better OT Goal Formulation: With patient/family Time For Goal Achievement: 03/13/23 Potential to Achieve Goals: Good ADL Goals Pt Will Perform Upper Body Bathing: with min assist Pt Will Perform Lower Body Bathing: with min assist;sit to/from stand Pt Will Transfer to Toilet: with contact guard assist;ambulating Additional ADL Goal #1: pt will independently direct staff/family regarding sling management  Plan      Co-evaluation                 AM-PAC OT "6 Clicks" Daily Activity     Outcome Measure   Help from another person eating meals?: A Little Help from another person taking care of personal grooming?: A Little Help from another person toileting, which includes using toliet, bedpan, or urinal?: A Lot Help from another person bathing (including washing, rinsing, drying)?: A Lot Help from another person to put on and taking off regular upper body clothing?: A Lot Help from another person to put on and taking off regular lower body clothing?: A Lot 6 Click Score: 14    End of Session Equipment Utilized During Treatment: Gait belt  OT Visit Diagnosis: Unsteadiness on feet (R26.81);Muscle weakness (generalized) (M62.81);History of falling (Z91.81);Pain Pain - Right/Left: Left Pain - part of body: Shoulder   Activity Tolerance Patient tolerated treatment well   Patient Left in chair;with call bell/phone within reach;with chair alarm set   Nurse Communication Mobility status;Other (comment) (pt asking for something for indigestion)        Time: 1342-1406 OT Time Calculation (min): 24 min  Charges: OT General Charges $OT Visit: 1 Visit OT Treatments $Self Care/Home Management : 8-22 mins $Therapeutic Exercise: 8-22 mins  Luisa Dago, OT/L   Acute OT Clinical  Specialist Acute Rehabilitation Services Pager [phone] Office [phone]   [facility] 02/28/2023, 2:19 PM

## 2023-03-01 DIAGNOSIS — I2694 Multiple subsegmental pulmonary emboli without acute cor pulmonale: Secondary | ICD-10-CM | POA: Diagnosis not present

## 2023-03-01 DIAGNOSIS — F419 Anxiety disorder, unspecified: Secondary | ICD-10-CM | POA: Diagnosis not present

## 2023-03-01 DIAGNOSIS — I10 Essential (primary) hypertension: Secondary | ICD-10-CM | POA: Diagnosis not present

## 2023-03-01 DIAGNOSIS — E119 Type 2 diabetes mellitus without complications: Secondary | ICD-10-CM | POA: Diagnosis not present

## 2023-03-01 DIAGNOSIS — S42252D Displaced fracture of greater tuberosity of left humerus, subsequent encounter for fracture with routine healing: Secondary | ICD-10-CM | POA: Diagnosis not present

## 2023-03-01 DIAGNOSIS — I2699 Other pulmonary embolism without acute cor pulmonale: Secondary | ICD-10-CM | POA: Diagnosis not present

## 2023-03-01 LAB — GLUCOSE, CAPILLARY
Glucose-Capillary: 296 mg/dL — ABNORMAL HIGH (ref 70–99)
Glucose-Capillary: 118 mg/dL — ABNORMAL HIGH (ref 70–99)
Glucose-Capillary: 185 mg/dL — ABNORMAL HIGH (ref 70–99)
Glucose-Capillary: 152 mg/dL — ABNORMAL HIGH (ref 70–99)

## 2023-03-01 LAB — BASIC METABOLIC PANEL
Anion gap: 10 (ref 5–15)
BUN: 14 mg/dL (ref 8–23)
CO2: 26 mmol/L (ref 22–32)
Calcium: 9.4 mg/dL (ref 8.9–10.3)
Chloride: 100 mmol/L (ref 98–111)
Creatinine, Ser: 0.8 mg/dL (ref 0.44–1.00)
GFR, Estimated: >60 mL/min (ref >60)
Glucose, Bld: 138 mg/dL — ABNORMAL HIGH (ref 70–99)
Potassium: 4 mmol/L (ref 3.5–5.1)
Sodium: 136 mmol/L (ref 135–145)

## 2023-03-01 NOTE — Progress Notes (Signed)
Patient in yellow mews at start of shift

## 2023-03-01 NOTE — Progress Notes (Signed)
Physical Therapy Treatment Patient Details Name: Melissa [name] MRN: [id number] DOB: 1935-04-30 Today's Date: 03/01/2023   History of Present Illness 88 y.o. female who presented to [facility] ED due to elevated heart rate. CTA +PE; bil UE and LE dopplers negative for DVT 9/9 and 9/10. PMH significant of recent shoulder fracture, T2DM, HLD, burning tongue syndrome, macular degeneration.    PT Comments  Pt received in supine, agreeable to therapy session and with good participation and tolerance for gait trial with cane and stair training to simulate porch step. Pt needing up to minA for stability with step up/down and for bed mobility and transfer to low toilet seat. Mildly decreased safety awareness pt needs cues to keep cane on the floor. Pt continues to benefit from PT services to progress toward functional mobility goals.    If plan is discharge home, recommend the following: A little help with walking and/or transfers;A little help with bathing/dressing/bathroom;Assistance with cooking/housework;Direct supervision/assist for medications management;Direct supervision/assist for financial management;Assist for transportation;Help with stairs or ramp for entrance   Can travel by private vehicle     Yes  Equipment Recommendations  None recommended by PT (continue using cane, once able to WB consider RW)    Recommendations for Other Services       Precautions / Restrictions Precautions Precautions: Fall Precaution Comments: recent  L humerus fx; OK for elbow/wrist/hand ROM Restrictions Weight Bearing Restrictions: Yes LUE Weight Bearing: Non weight bearing Other Position/Activity Restrictions: sling seeing Dr Dion Saucier     Mobility  Bed Mobility Overal bed mobility: Needs Assistance Bed Mobility: Supine to Sit, Sit to Supine     Supine to sit: Min assist Sit to supine: Min assist   General bed mobility comments: cues for use of bed rail with RUE, increased pain with return to  supine    Transfers Overall transfer level: Needs assistance Equipment used: Straight cane Transfers: Sit to/from Stand Sit to Stand: Contact guard assist, Min assist           General transfer comment: pt required cues to push off surface and then reach for [facility] w/ transfers.  Pt has "hurry-cane" at home. With toilet pt using wall rail but needs minA to safely guide her hips in the correct direction.    Ambulation/Gait Ambulation/Gait assistance: Contact guard assist Gait Distance (Feet): 125 Feet (x2 with standing break) Assistive device: Straight cane Gait Pattern/deviations: Step-through pattern, Decreased stride length, Trunk flexed       General Gait Details: HR remained low 120's throughout session, nursing aware. No LOB using cane, pt needs cues to not lift cane off floor and gesture with it. No significant dyspnea on exertion.   Stairs Stairs: Yes Stairs assistance: Min assist Stair Management: One rail Right, Step to pattern, Forwards Number of Stairs: 1 General stair comments: cues for sequencing/safety. Pt states she needs wall rail rather than cane as step is slightly higher than hers at home   Wheelchair Mobility     Tilt Bed    Modified Rankin (Stroke Patients Only)       Balance Overall balance assessment: History of Falls, Needs assistance Sitting-balance support: No upper extremity supported Sitting balance-Leahy Scale: Good     Standing balance support: Single extremity supported Standing balance-Leahy Scale: Poor                              Cognition Arousal: Alert Behavior During Therapy: WFL for tasks  assessed/performed Overall Cognitive Status: Within Functional Limits for tasks assessed                                 General Comments: baseline per family        Exercises      General Comments        Pertinent Vitals/Pain Pain Assessment Faces Pain Scale: Hurts a little bit Pain Location: L  shoulder Pain Descriptors / Indicators: Aching, Discomfort    Home Living                          Prior Function            PT Goals (current goals can now be found in the care plan section) Acute Rehab PT Goals PT Goal Formulation: With patient Time For Goal Achievement: 03/13/23 Progress towards PT goals: Progressing toward goals    Frequency    Min 1X/week      PT Plan      Co-evaluation              AM-PAC PT "6 Clicks" Mobility   Outcome Measure  Help needed turning from your back to your side while in a flat bed without using bedrails?: A Little Help needed moving from lying on your back to sitting on the side of a flat bed without using bedrails?: A Lot (without rail with WB restrictions) Help needed moving to and from a bed to a chair (including a wheelchair)?: A Little Help needed standing up from a chair using your arms (e.g., wheelchair or bedside chair)?: A Little Help needed to walk in hospital room?: A Little Help needed climbing 3-5 steps with a railing? : A Lot (only performed 1 today) 6 Click Score: 16    End of Session Equipment Utilized During Treatment: Gait belt Activity Tolerance: Patient tolerated treatment well Patient left: in bed;with call bell/phone within reach;with bed alarm set;Other (comment) (bed in chair posture) Nurse Communication: Mobility status;Other (comment) (elevated HR, pt had BM) PT Visit Diagnosis: Unsteadiness on feet (R26.81);Repeated falls (R29.6)     Time: 4782-9562 PT Time Calculation (min) (ACUTE ONLY): 29 min  Charges:    $Gait Training: 8-22 mins $Therapeutic Activity: 8-22 mins PT General Charges $$ ACUTE PT VISIT: 1 Visit                     Taft Worthing P., PTA Acute Rehabilitation Services Secure Chat Preferred 9a-5:30pm Office: [phone]    Dorathy Kinsman [facility] 03/01/2023, 6:41 PM

## 2023-03-01 NOTE — Care Management Important Message (Signed)
Important Message  Patient Details  Name: Melissa [name] MRN: [id number] Date of Birth: 25-May-1935   Medicare Important Message Given:  Yes     Melissa [name] Stefan [facility] 03/01/2023, 3:16 PM

## 2023-03-01 NOTE — Progress Notes (Signed)
PROGRESS NOTE        PATIENT DETAILS Name: Melissa [name] Age: 88 y.o. Sex: female Date of Birth: Mar 01, 1935 Admit Date: 02/25/2023 Admitting Physician Gery Pray, MD NUU:[id number], Ezequiel Essex, DO  Brief Summary: Patient is a 88 y.o.  female with history of DM-2, HLD, recent shoulder fracture-presented with palpitation-found to have pulmonary embolism.  Significant events: 9/7>> admit to [facility]  Significant studies: 9/7>> CTA chest: PE right middle lobe/subsegmental PE left upper lobe. 9/9>> bilateral lower extremity Doppler: No DVT 9/9>> echo: EF 60-65%, RV systolic function normal. 9/10>> left upper extremity Doppler: No DVT.  Significant microbiology data: 9/7>> COVID/influenza/RSV PCR: Negative  Procedures: None  Consults: None  Subjective: Lying comfortably in bed-denies any chest pain or shortness of breath.  Objective: Vitals: Blood pressure 108/89, pulse (!) 113, temperature 97.7 F (36.5 C), temperature source Oral, resp. rate (!) 24, weight 54.3 kg, SpO2 91%.   Exam: Gen Exam:Alert awake-not in any distress HEENT:atraumatic, normocephalic Chest: B/L clear to auscultation anteriorly CVS:S1S2 regular Abdomen:soft non tender, non distended Extremities:no edema Neurology: Non focal Skin: no rash  Pertinent Labs/Radiology:    Latest Ref Rng & Units 02/28/2023    3:04 AM 02/27/2023    4:12 AM 02/26/2023    1:38 PM  CBC  WBC 4.0 - 10.5 K/uL 7.1  7.3  6.8   Hemoglobin 12.0 - 15.0 g/dL 44.0  34.7  42.5   Hematocrit 36.0 - 46.0 % 31.4  32.7  33.9   Platelets 150 - 400 K/uL 329  314  321     Lab Results  Component Value Date   NA 136 03/01/2023   K 4.0 03/01/2023   CL 100 03/01/2023   CO2 26 03/01/2023     Assessment/Plan: Pulm embolism Likely unprovoked Continue Eliquis Needs hematology evaluation as an outpatient before contemplating stopping anticoagulation  Left humeral fracture Secondary to mechanical fall-this occurred on  8/31-prior to this admission Supportive care As needed oxycodone PT/OT  DM-2 (A1c 7.2 on 9/8) SSI Resume metformin on discharge  Recent Labs    02/28/23 1556 02/28/23 2131 03/01/23 0831  GLUCAP 114* 166* 296*    PAD Aspirin/statin  Normocytic anemia Mild No evidence of bleeding Continue close monitoring/follow-up  Anxiety Lexapro  Debility/deconditioning Recently shoulder fracture PT/OT eval-SNF recommended  BMI: Estimated body mass index is 21.21 kg/m as calculated from the following:   Height as of 02/16/23: 5\' 3"  (1.6 m).   Weight as of this encounter: 54.3 kg.   Code status:   Code Status: Full Code   DVT Prophylaxis: apixaban (ELIQUIS) tablet 10 mg  apixaban (ELIQUIS) tablet 5 mg     Family Communication: None at bedside   Disposition Plan: Status is: Inpatient Remains inpatient appropriate because: Severity of illness   Planned Discharge Destination:Skilled nursing facility   Diet: Diet Order             Diet Carb Modified Fluid consistency: Thin; Room service appropriate? Yes  Diet effective now                     Antimicrobial agents: Anti-infectives (From admission, onward)    None        MEDICATIONS: Scheduled Meds:  apixaban  10 mg Oral BID   Followed by   Melene Muller ON 03/07/2023] apixaban  5 mg Oral BID   aspirin  EC  81 mg Oral Daily   atorvastatin  40 mg Oral Daily   calcium-vitamin D  0.5 tablet Oral BID   escitalopram  5 mg Oral Daily   gabapentin  300 mg Oral QHS   insulin aspart  0-15 Units Subcutaneous TID WC   pantoprazole  40 mg Oral Daily   sodium chloride flush  3 mL Intravenous Q12H   Continuous Infusions: PRN Meds:.acetaminophen **OR** acetaminophen, alum & mag hydroxide-simeth, melatonin, ondansetron **OR** ondansetron (ZOFRAN) IV, mouth rinse, oxyCODONE, polyethylene glycol   I have personally reviewed following labs and imaging studies  LABORATORY DATA: CBC: Recent Labs  Lab 02/25/23 1723  02/26/23 1338 02/27/23 0412 02/28/23 0304  WBC 6.3 6.8 7.3 7.1  NEUTROABS 4.5  --   --   --   HGB 10.9* 10.8* 10.5* 10.5*  HCT 32.8* 33.9* 32.7* 31.4*  MCV 94.5 95.8 96.2 96.3  PLT 309 321 314 329    Basic Metabolic Panel: Recent Labs  Lab 02/25/23 1723 02/26/23 1338 02/28/23 0304 03/01/23 0636  NA 139 138 138 136  K 4.3 4.0 3.8 4.0  CL 103 101 103 100  CO2 26 23 26 26   GLUCOSE 142* 153* 113* 138*  BUN 21 17 13 14   CREATININE 1.00 0.86 0.87 0.80  CALCIUM 8.7* 8.9 9.5 9.4    GFR: Estimated Creatinine Clearance: 41.8 mL/min (by C-G formula based on SCr of 0.8 mg/dL).  Liver Function Tests: Recent Labs  Lab 02/25/23 1723  AST 13*  ALT 10  ALKPHOS 63  BILITOT 0.5  PROT 6.0*  ALBUMIN 3.5   No results for input(s): "LIPASE", "AMYLASE" in the last 168 hours. No results for input(s): "AMMONIA" in the last 168 hours.  Coagulation Profile: No results for input(s): "INR", "PROTIME" in the last 168 hours.  Cardiac Enzymes: No results for input(s): "CKTOTAL", "CKMB", "CKMBINDEX", "TROPONINI" in the last 168 hours.  BNP (last 3 results) No results for input(s): "PROBNP" in the last 8760 hours.  Lipid Profile: No results for input(s): "CHOL", "HDL", "LDLCALC", "TRIG", "CHOLHDL", "LDLDIRECT" in the last 72 hours.  Thyroid Function Tests: No results for input(s): "TSH", "T4TOTAL", "FREET4", "T3FREE", "THYROIDAB" in the last 72 hours.  Anemia Panel: No results for input(s): "VITAMINB12", "FOLATE", "FERRITIN", "TIBC", "IRON", "RETICCTPCT" in the last 72 hours.  Urine analysis:    Component Value Date/Time   COLORURINE YELLOW 06/07/2017 0901   APPEARANCEUR SL CLOUDY (A) 06/07/2017 0901   LABSPEC 1.025 06/07/2017 0901   PHURINE 5.5 06/07/2017 0901   GLUCOSEU NEGATIVE 06/07/2017 0901   HGBUR TRACE-INTACT (A) 06/07/2017 0901   HGBUR trace-intact 07/30/2009 1336   BILIRUBINUR 1 11/28/2018 1046   KETONESUR NEGATIVE 06/07/2017 0901   PROTEINUR Negative 11/28/2018  1046   UROBILINOGEN negative (A) 11/28/2018 1046   UROBILINOGEN 0.2 06/07/2017 0901   NITRITE neg 11/28/2018 1046   NITRITE NEGATIVE 06/07/2017 0901   LEUKOCYTESUR Small (1+) (A) 11/28/2018 1046    Sepsis Labs: Lactic Acid, Venous    Component Value Date/Time   LATICACIDVEN 1.3 02/25/2023 2146    MICROBIOLOGY: Recent Results (from the past 240 hour(s))  Resp panel by RT-PCR (RSV, Flu A&B, Covid) Anterior Nasal Swab     Status: None   Collection Time: 02/25/23  4:50 PM   Specimen: Anterior Nasal Swab  Result Value Ref Range Status   SARS Coronavirus 2 by RT PCR NEGATIVE NEGATIVE Final    Comment: (NOTE) SARS-CoV-2 target nucleic acids are NOT DETECTED.  The SARS-CoV-2 RNA is generally detectable in  upper respiratory specimens during the acute phase of infection. The lowest concentration of SARS-CoV-2 viral copies this assay can detect is 138 copies/mL. A negative result does not preclude SARS-Cov-2 infection and should not be used as the sole basis for treatment or other patient management decisions. A negative result may occur with  improper specimen collection/handling, submission of specimen other than nasopharyngeal swab, presence of viral mutation(s) within the areas targeted by this assay, and inadequate number of viral copies(<138 copies/mL). A negative result must be combined with clinical observations, patient history, and epidemiological information. The expected result is Negative.  Fact Sheet for Patients:  BloggerCourse.com  Fact Sheet for Healthcare Providers:  SeriousBroker.it  This test is no t yet approved or cleared by the [place] FDA and  has been authorized for detection and/or diagnosis of SARS-CoV-2 by FDA under an Emergency Use Authorization (EUA). This EUA will remain  in effect (meaning this test can be used) for the duration of the COVID-19 declaration under Section 564(b)(1) of the Act,  21 U.S.C.section 360bbb-3(b)(1), unless the authorization is terminated  or revoked sooner.       Influenza A by PCR NEGATIVE NEGATIVE Final   Influenza B by PCR NEGATIVE NEGATIVE Final    Comment: (NOTE) The Xpert Xpress SARS-CoV-2/FLU/RSV plus assay is intended as an aid in the diagnosis of influenza from Nasopharyngeal swab specimens and should not be used as a sole basis for treatment. Nasal washings and aspirates are unacceptable for Xpert Xpress SARS-CoV-2/FLU/RSV testing.  Fact Sheet for Patients: BloggerCourse.com  Fact Sheet for Healthcare Providers: SeriousBroker.it  This test is not yet approved or cleared by the [place] FDA and has been authorized for detection and/or diagnosis of SARS-CoV-2 by FDA under an Emergency Use Authorization (EUA). This EUA will remain in effect (meaning this test can be used) for the duration of the COVID-19 declaration under Section 564(b)(1) of the Act, 21 U.S.C. section 360bbb-3(b)(1), unless the authorization is terminated or revoked.     Resp Syncytial Virus by PCR NEGATIVE NEGATIVE Final    Comment: (NOTE) Fact Sheet for Patients: BloggerCourse.com  Fact Sheet for Healthcare Providers: SeriousBroker.it  This test is not yet approved or cleared by the [place] FDA and has been authorized for detection and/or diagnosis of SARS-CoV-2 by FDA under an Emergency Use Authorization (EUA). This EUA will remain in effect (meaning this test can be used) for the duration of the COVID-19 declaration under Section 564(b)(1) of the Act, 21 U.S.C. section 360bbb-3(b)(1), unless the authorization is terminated or revoked.  Performed at [facility], [address] [place], [place] [address]     RADIOLOGY STUDIES/RESULTS: VAS [place] UPPER EXTREMITY VENOUS DUPLEX  Result Date: 02/28/2023 UPPER VENOUS STUDY   Patient Name:  Melissa [name]  Date of Exam:   02/28/2023 Medical Rec #: [id number]       Accession #:    [id number] Date of Birth: [birth year]        Patient Gender: F Patient Age:   12 years Exam Location:  [facility] Procedure:      VAS [place] UPPER EXTREMITY VENOUS DUPLEX Referring Phys: DAWOOD ELGERGAWY --------------------------------------------------------------------------------  Indications: pulmonary embolism Other Indications: Recent LUE fracture. Limitations: Limited range of motion due to pain (fractured proximal humerus). Comparison Study: No previous exams Performing Technologist: Jody Hill RVT, RDMS  Examination Guidelines: A complete evaluation includes B-mode imaging, spectral Doppler, color Doppler, and power Doppler as needed of all accessible portions of each vessel. Bilateral testing  is considered an integral part of a complete examination. Limited examinations for reoccurring indications may be performed as noted.  Right Findings: +----------+------------+---------+-----------+----------+--------------------+ RIGHT     CompressiblePhasicitySpontaneousProperties      Summary        +----------+------------+---------+-----------+----------+--------------------+ Subclavian               Yes       Yes                   patent by                                                              color/doppler     +----------+------------+---------+-----------+----------+--------------------+  Left Findings: +----------+------------+---------+-----------+----------+--------------------+ LEFT      CompressiblePhasicitySpontaneousProperties      Summary        +----------+------------+---------+-----------+----------+--------------------+ IJV           Full       Yes       Yes                                   +----------+------------+---------+-----------+----------+--------------------+ Subclavian               Yes       Yes                   patent by                                                               color/doppler     +----------+------------+---------+-----------+----------+--------------------+ Axillary      Full       Yes       Yes                                   +----------+------------+---------+-----------+----------+--------------------+ Brachial      Full                                                       +----------+------------+---------+-----------+----------+--------------------+ Radial        Full                                                       +----------+------------+---------+-----------+----------+--------------------+ Ulnar         Full                                                       +----------+------------+---------+-----------+----------+--------------------+ Cephalic  Full                                                       +----------+------------+---------+-----------+----------+--------------------+ Basilic       Full       Yes       Yes                                   +----------+------------+---------+-----------+----------+--------------------+  Summary:  Right: No evidence of thrombosis in the subclavian.  Left: No evidence of deep vein thrombosis in the upper extremity. No evidence of superficial vein thrombosis in the upper extremity.  *See table(s) above for measurements and observations.  Diagnosing physician: Lemar Livings MD Electronically signed by Lemar Livings MD on 02/28/2023 at 5:39:41 PM.    Final    ECHOCARDIOGRAM COMPLETE  Result Date: 02/27/2023    ECHOCARDIOGRAM REPORT   Patient Name:   Melissa [name] Date of Exam: 02/27/2023 Medical Rec #:  [id number]      Height:       63.0 in Accession #:    [id number]     Weight:       122.8 lb Date of Birth:  12-17-1934       BSA:          1.572 m Patient Age:    88 years       BP:           99/62 mmHg Patient Gender: F              HR:           99 bpm. Exam Location:  Inpatient Procedure: 2D Echo, Cardiac  Doppler and Color Doppler Indications:    Pulmonary Embolus I26.09  History:        Patient has no prior history of Echocardiogram examinations. PAD                 and P.E.; Risk Factors:Diabetes, Hypertension, Non-Smoker and                 Dyslipidemia.  Sonographer:    Aron Baba Referring Phys: [id number] Verdene Lennert  Sonographer Comments: Technically challenging study due to limited acoustic windows and Technically difficult study due to poor echo windows. IMPRESSIONS  1. Cannot assess wall motion. Windows are limited. Left ventricular ejection fraction, by estimation, is 60 to 65%. The left ventricle has normal function. The left ventricle has no regional wall motion abnormalities. There is mild left ventricular hypertrophy. Left ventricular diastolic function could not be evaluated.  2. Right ventricular systolic function is normal. The right ventricular size is not well visualized. There is normal pulmonary artery systolic pressure. The estimated right ventricular systolic pressure is 27.6 mmHg.  3. A small pericardial effusion is present.  4. The mitral valve was not well visualized. No evidence of mitral valve regurgitation.  5. Aortic valve regurgitation is not visualized.  6. The inferior vena cava is normal in size with greater than 50% respiratory variability, suggesting right atrial pressure of 3 mmHg. FINDINGS  Left Ventricle: Cannot assess wall motion. Windows are limited. Left ventricular ejection fraction, by estimation, is 60 to 65%. The left ventricle has normal function. The left ventricle has no regional wall motion abnormalities. The  left ventricular internal cavity size was normal in size. There is mild left ventricular hypertrophy. Left ventricular diastolic function could not be evaluated. Right Ventricle: The right ventricular size is not well visualized. Right ventricular systolic function is normal. There is normal pulmonary artery systolic pressure. The tricuspid regurgitant  velocity is 2.48 m/s, and with an assumed right atrial pressure of 3 mmHg, the estimated right ventricular systolic pressure is 27.6 mmHg. Left Atrium: Left atrial size was not well visualized. Right Atrium: Right atrial size was not well visualized. Pericardium: A small pericardial effusion is present. Mitral Valve: The mitral valve was not well visualized. No evidence of mitral valve regurgitation. Tricuspid Valve: Tricuspid valve regurgitation is mild. Aortic Valve: Aortic valve regurgitation is not visualized. Pulmonic Valve: The pulmonic valve was not well visualized. Pulmonic valve regurgitation is not visualized. Aorta: The aortic root and ascending aorta are structurally normal, with no evidence of dilitation. Venous: The inferior vena cava is normal in size with greater than 50% respiratory variability, suggesting right atrial pressure of 3 mmHg. IAS/Shunts: The interatrial septum was not well visualized.  LEFT VENTRICLE PLAX 2D LVIDd:         3.40 cm   Diastology LVIDs:         2.10 cm   LV e' medial:  5.11 cm/s LV PW:         1.10 cm   LV e' lateral: 5.43 cm/s LV IVS:        0.70 cm LVOT diam:     2.20 cm LV SV:         48 LV SV Index:   30 LVOT Area:     3.80 cm  RIGHT VENTRICLE RV S prime:     15.80 cm/s LEFT ATRIUM         Index LA diam:    2.90 cm 1.85 cm/m  AORTIC VALVE LVOT Vmax:   76.70 cm/s LVOT Vmean:  63.700 cm/s LVOT VTI:    0.126 m  AORTA Ao Root diam: 3.30 cm Ao Asc diam:  3.40 cm MR Peak grad: 6.1 mmHg    TRICUSPID VALVE MR Vmax:      123.00 cm/s TR Peak grad:   24.6 mmHg                           TR Vmax:        248.00 cm/s                            SHUNTS                           Systemic VTI:  0.13 m                           Systemic Diam: 2.20 cm Carolan Clines Electronically signed by Carolan Clines Signature Date/Time: 02/27/2023/3:19:13 PM    Final      LOS: 3 days   Jeoffrey Massed, MD  [facility]    To contact the attending provider between 7A-7P or the covering  provider during after hours 7P-7A, please log into the web site [email or web address] and access using universal [place] password for that web site. If you do not have the password, please call the hospital operator.  03/01/2023, 11:23 AM

## 2023-03-01 NOTE — Progress Notes (Signed)
Mobility Specialist Progress Note:   03/01/23 1340  Mobility  Activity Ambulated with assistance in hallway  Level of Assistance Contact guard assist, steadying assist  Assistive Device Other (Comment) (HHA)  Distance Ambulated (ft) 220 ft  LUE Weight Bearing NWB  Activity Response Tolerated well  Mobility Referral Yes  $Mobility charge 1 Mobility  Mobility Specialist Start Time (ACUTE ONLY) 1340  Mobility Specialist Stop Time (ACUTE ONLY) 1400  Mobility Specialist Time Calculation (min) (ACUTE ONLY) 20 min   Pt agreeable to mobility session. Required only minor steadying assist with ambulation via HHA. No c/o throughout, pt left sitting up in chair eating lunch.  [name] Mobility Specialist Please contact via SecureChat or  Rehab office at [phone]

## 2023-03-01 NOTE — Discharge Instructions (Signed)
Information on my medicine - ELIQUIS (apixaban)  This medication education was reviewed with me or my healthcare representative as part of my discharge preparation.   Why was Eliquis prescribed for you? Eliquis was prescribed to treat blood clots that may have been found in the veins of your legs (deep vein thrombosis) or in your lungs (pulmonary embolism) and to reduce the risk of them occurring again.  What do You need to know about Eliquis ? The starting dose is 10 mg (two 5 mg tablets) taken TWICE daily for the FIRST SEVEN (7) DAYS, then on  03/07/2023  the dose is reduced to ONE 5 mg tablet taken TWICE daily.  Eliquis may be taken with or without food.   Try to take the dose about the same time in the morning and in the evening. If you have difficulty swallowing the tablet whole please discuss with your pharmacist how to take the medication safely.  Take Eliquis exactly as prescribed and DO NOT stop taking Eliquis without talking to the doctor who prescribed the medication.  Stopping may increase your risk of developing a new blood clot.  Refill your prescription before you run out.  After discharge, you should have regular check-up appointments with your healthcare provider that is prescribing your Eliquis.    What do you do if you miss a dose? If a dose of ELIQUIS is not taken at the scheduled time, take it as soon as possible on the same day and twice-daily administration should be resumed. The dose should not be doubled to make up for a missed dose.  Important Safety Information A possible side effect of Eliquis is bleeding. You should call your healthcare provider right away if you experience any of the following: Bleeding from an injury or your nose that does not stop. Unusual colored urine (red or dark brown) or unusual colored stools (red or black). Unusual bruising for unknown reasons. A serious fall or if you hit your head (even if there is no bleeding).  Some  medicines may interact with Eliquis and might increase your risk of bleeding or clotting while on Eliquis. To help avoid this, consult your healthcare provider or pharmacist prior to using any new prescription or non-prescription medications, including herbals, vitamins, non-steroidal anti-inflammatory drugs (NSAIDs) and supplements.  This website has more information on Eliquis (apixaban): http://www.eliquis.com/eliquis/home

## 2023-03-02 DIAGNOSIS — F419 Anxiety disorder, unspecified: Secondary | ICD-10-CM

## 2023-03-02 DIAGNOSIS — I10 Essential (primary) hypertension: Secondary | ICD-10-CM | POA: Diagnosis not present

## 2023-03-02 DIAGNOSIS — E119 Type 2 diabetes mellitus without complications: Secondary | ICD-10-CM | POA: Diagnosis not present

## 2023-03-02 DIAGNOSIS — I2699 Other pulmonary embolism without acute cor pulmonale: Secondary | ICD-10-CM | POA: Diagnosis not present

## 2023-03-02 LAB — GLUCOSE, CAPILLARY
Glucose-Capillary: 124 mg/dL — ABNORMAL HIGH (ref 70–99)
Glucose-Capillary: 137 mg/dL — ABNORMAL HIGH (ref 70–99)
Glucose-Capillary: 136 mg/dL — ABNORMAL HIGH (ref 70–99)
Glucose-Capillary: 203 mg/dL — ABNORMAL HIGH (ref 70–99)

## 2023-03-02 MED ORDER — OXYCODONE HCL 5 MG PO TABS
5.0000 mg | ORAL_TABLET | Freq: Four times a day (QID) | ORAL | 0 refills | Status: DC | PRN
Start: 2023-03-02 — End: 2023-04-04

## 2023-03-02 MED ORDER — APIXABAN 5 MG PO TABS: ORAL_TABLET | ORAL | Status: DC

## 2023-03-02 NOTE — Plan of Care (Signed)
  Problem: Education: Goal: Ability to describe self-care measures that may prevent or decrease complications (Diabetes Survival Skills Education) will improve Outcome: Progressing Goal: Individualized Educational Video(s) Outcome: Progressing   Problem: Coping: Goal: Ability to adjust to condition or change in health will improve Outcome: Progressing   Problem: Fluid Volume: Goal: Ability to maintain a balanced intake and output will improve Outcome: Progressing   Problem: Health Behavior/Discharge Planning: Goal: Ability to identify and utilize available resources and services will improve Outcome: Progressing Goal: Ability to manage health-related needs will improve Outcome: Progressing   Problem: Skin Integrity: Goal: Risk for impaired skin integrity will decrease Outcome: Progressing   

## 2023-03-02 NOTE — Discharge Summary (Addendum)
every 6 (six) hours as needed for up to 20 doses for breakthrough pain.   Oyster Shell Calcium/D 250-125 MG-UNIT Tabs Take by mouth.   pantoprazole 40 MG tablet Commonly known as: PROTONIX Take 1 tablet (40 mg total) by mouth daily.        Contact information for follow-up providers     Kuneff, Renee A, DO. Schedule an appointment as soon as possible for a visit in 1 week(s).   Specialty: Family Medicine Contact information: [address] [place] [address] [phone]         Rodolph Bong, MD. Schedule an appointment as soon as possible for a visit in 1 week(s).   Specialties: Family Medicine, Emergency Medicine, Sports Medicine Contact information: [address] [place] [address] [phone]              Contact information for after-discharge care     Destination     [facility] .   Service: Skilled  Nursing Contact information: [address] [place] [address] [phone]                    No Known Allergies   Other Procedures/Studies: VAS [place] UPPER EXTREMITY VENOUS DUPLEX  Result Date: 02/28/2023 UPPER VENOUS STUDY  Patient Name:  Melissa [name]  Date of Exam:   02/28/2023 Medical Rec #: [id number]       Accession #:    [id number] Date of Birth: 21-Jan-1935        Patient Gender: F Patient Age:   88 years Exam Location:  [facility] Procedure:      VAS [place] UPPER EXTREMITY VENOUS DUPLEX Referring Phys: DAWOOD ELGERGAWY --------------------------------------------------------------------------------  Indications: pulmonary embolism Other Indications: Recent LUE fracture. Limitations: Limited range of motion due to pain (fractured proximal humerus). Comparison Study: No previous exams Performing Technologist: Jody Hill RVT, RDMS  Examination Guidelines: A complete evaluation includes B-mode imaging, spectral Doppler, color Doppler, and power Doppler as needed of all accessible portions of each vessel. Bilateral testing is considered an integral part of a complete examination. Limited examinations for reoccurring indications may be performed as noted.  Right Findings: +----------+------------+---------+-----------+----------+--------------------+ RIGHT     CompressiblePhasicitySpontaneousProperties      Summary        +----------+------------+---------+-----------+----------+--------------------+ Subclavian               Yes       Yes                   patent by                                                              color/doppler     +----------+------------+---------+-----------+----------+--------------------+  Left Findings: +----------+------------+---------+-----------+----------+--------------------+ LEFT      CompressiblePhasicitySpontaneousProperties      Summary         +----------+------------+---------+-----------+----------+--------------------+ IJV           Full       Yes       Yes                                   +----------+------------+---------+-----------+----------+--------------------+ Subclavian  every 6 (six) hours as needed for up to 20 doses for breakthrough pain.   Oyster Shell Calcium/D 250-125 MG-UNIT Tabs Take by mouth.   pantoprazole 40 MG tablet Commonly known as: PROTONIX Take 1 tablet (40 mg total) by mouth daily.        Contact information for follow-up providers     Kuneff, Renee A, DO. Schedule an appointment as soon as possible for a visit in 1 week(s).   Specialty: Family Medicine Contact information: [address] [place] [address] [phone]         Rodolph Bong, MD. Schedule an appointment as soon as possible for a visit in 1 week(s).   Specialties: Family Medicine, Emergency Medicine, Sports Medicine Contact information: [address] [place] [address] [phone]              Contact information for after-discharge care     Destination     [facility] .   Service: Skilled  Nursing Contact information: [address] [place] [address] [phone]                    No Known Allergies   Other Procedures/Studies: VAS [place] UPPER EXTREMITY VENOUS DUPLEX  Result Date: 02/28/2023 UPPER VENOUS STUDY  Patient Name:  Melissa [name]  Date of Exam:   02/28/2023 Medical Rec #: [id number]       Accession #:    [id number] Date of Birth: 21-Jan-1935        Patient Gender: F Patient Age:   88 years Exam Location:  [facility] Procedure:      VAS [place] UPPER EXTREMITY VENOUS DUPLEX Referring Phys: DAWOOD ELGERGAWY --------------------------------------------------------------------------------  Indications: pulmonary embolism Other Indications: Recent LUE fracture. Limitations: Limited range of motion due to pain (fractured proximal humerus). Comparison Study: No previous exams Performing Technologist: Jody Hill RVT, RDMS  Examination Guidelines: A complete evaluation includes B-mode imaging, spectral Doppler, color Doppler, and power Doppler as needed of all accessible portions of each vessel. Bilateral testing is considered an integral part of a complete examination. Limited examinations for reoccurring indications may be performed as noted.  Right Findings: +----------+------------+---------+-----------+----------+--------------------+ RIGHT     CompressiblePhasicitySpontaneousProperties      Summary        +----------+------------+---------+-----------+----------+--------------------+ Subclavian               Yes       Yes                   patent by                                                              color/doppler     +----------+------------+---------+-----------+----------+--------------------+  Left Findings: +----------+------------+---------+-----------+----------+--------------------+ LEFT      CompressiblePhasicitySpontaneousProperties      Summary         +----------+------------+---------+-----------+----------+--------------------+ IJV           Full       Yes       Yes                                   +----------+------------+---------+-----------+----------+--------------------+ Subclavian  every 6 (six) hours as needed for up to 20 doses for breakthrough pain.   Oyster Shell Calcium/D 250-125 MG-UNIT Tabs Take by mouth.   pantoprazole 40 MG tablet Commonly known as: PROTONIX Take 1 tablet (40 mg total) by mouth daily.        Contact information for follow-up providers     Kuneff, Renee A, DO. Schedule an appointment as soon as possible for a visit in 1 week(s).   Specialty: Family Medicine Contact information: [address] [place] [address] [phone]         Rodolph Bong, MD. Schedule an appointment as soon as possible for a visit in 1 week(s).   Specialties: Family Medicine, Emergency Medicine, Sports Medicine Contact information: [address] [place] [address] [phone]              Contact information for after-discharge care     Destination     [facility] .   Service: Skilled  Nursing Contact information: [address] [place] [address] [phone]                    No Known Allergies   Other Procedures/Studies: VAS [place] UPPER EXTREMITY VENOUS DUPLEX  Result Date: 02/28/2023 UPPER VENOUS STUDY  Patient Name:  Melissa [name]  Date of Exam:   02/28/2023 Medical Rec #: [id number]       Accession #:    [id number] Date of Birth: 21-Jan-1935        Patient Gender: F Patient Age:   88 years Exam Location:  [facility] Procedure:      VAS [place] UPPER EXTREMITY VENOUS DUPLEX Referring Phys: DAWOOD ELGERGAWY --------------------------------------------------------------------------------  Indications: pulmonary embolism Other Indications: Recent LUE fracture. Limitations: Limited range of motion due to pain (fractured proximal humerus). Comparison Study: No previous exams Performing Technologist: Jody Hill RVT, RDMS  Examination Guidelines: A complete evaluation includes B-mode imaging, spectral Doppler, color Doppler, and power Doppler as needed of all accessible portions of each vessel. Bilateral testing is considered an integral part of a complete examination. Limited examinations for reoccurring indications may be performed as noted.  Right Findings: +----------+------------+---------+-----------+----------+--------------------+ RIGHT     CompressiblePhasicitySpontaneousProperties      Summary        +----------+------------+---------+-----------+----------+--------------------+ Subclavian               Yes       Yes                   patent by                                                              color/doppler     +----------+------------+---------+-----------+----------+--------------------+  Left Findings: +----------+------------+---------+-----------+----------+--------------------+ LEFT      CompressiblePhasicitySpontaneousProperties      Summary         +----------+------------+---------+-----------+----------+--------------------+ IJV           Full       Yes       Yes                                   +----------+------------+---------+-----------+----------+--------------------+ Subclavian  Final    ECHOCARDIOGRAM COMPLETE  Result Date: 02/27/2023    ECHOCARDIOGRAM REPORT   Patient Name:   Melissa [name] Date of Exam: 02/27/2023 Medical Rec #:  [id number]      Height:       63.0 in Accession #:    [id number]     Weight:       122.8 lb Date of Birth:  1934-12-19       BSA:          1.572 m Patient Age:    88 years       BP:           99/62 mmHg Patient Gender: F              HR:           99 bpm. Exam Location:  Inpatient Procedure: 2D Echo, Cardiac Doppler and Color Doppler Indications:    Pulmonary Embolus I26.09  History:        Patient has no prior history of Echocardiogram examinations. PAD                 and P.E.; Risk Factors:Diabetes, Hypertension, Non-Smoker and                 Dyslipidemia.  Sonographer:    Aron Baba Referring Phys: [id number] Verdene Lennert  Sonographer Comments: Technically challenging study due to limited acoustic windows and Technically difficult study due to poor echo windows. IMPRESSIONS  1. Cannot assess wall motion. Windows are limited. Left ventricular ejection fraction, by estimation, is 60 to 65%. The left ventricle has normal function. The left ventricle has no regional wall motion abnormalities. There is mild left ventricular hypertrophy. Left ventricular diastolic function could not be evaluated.  2. Right ventricular systolic function is normal. The right ventricular size is not well visualized. There is normal pulmonary artery systolic pressure. The estimated  right ventricular systolic pressure is 27.6 mmHg.  3. A small pericardial effusion is present.  4. The mitral valve was not well visualized. No evidence of mitral valve regurgitation.  5. Aortic valve regurgitation is not visualized.  6. The inferior vena cava is normal in size with greater than 50% respiratory variability, suggesting right atrial pressure of 3 mmHg. FINDINGS  Left Ventricle: Cannot assess wall motion. Windows are limited. Left ventricular ejection fraction, by estimation, is 60 to 65%. The left ventricle has normal function. The left ventricle has no regional wall motion abnormalities. The left ventricular internal cavity size was normal in size. There is mild left ventricular hypertrophy. Left ventricular diastolic function could not be evaluated. Right Ventricle: The right ventricular size is not well visualized. Right ventricular systolic function is normal. There is normal pulmonary artery systolic pressure. The tricuspid regurgitant velocity is 2.48 m/s, and with an assumed right atrial pressure of 3 mmHg, the estimated right ventricular systolic pressure is 27.6 mmHg. Left Atrium: Left atrial size was not well visualized. Right Atrium: Right atrial size was not well visualized. Pericardium: A small pericardial effusion is present. Mitral Valve: The mitral valve was not well visualized. No evidence of mitral valve regurgitation. Tricuspid Valve: Tricuspid valve regurgitation is mild. Aortic Valve: Aortic valve regurgitation is not visualized. Pulmonic Valve: The pulmonic valve was not well visualized. Pulmonic valve regurgitation is not visualized. Aorta: The aortic root and ascending aorta are structurally normal, with no evidence of dilitation. Venous: The inferior vena cava is normal in size with greater than 50% respiratory variability, suggesting right  Final    ECHOCARDIOGRAM COMPLETE  Result Date: 02/27/2023    ECHOCARDIOGRAM REPORT   Patient Name:   Melissa [name] Date of Exam: 02/27/2023 Medical Rec #:  [id number]      Height:       63.0 in Accession #:    [id number]     Weight:       122.8 lb Date of Birth:  1934-12-19       BSA:          1.572 m Patient Age:    88 years       BP:           99/62 mmHg Patient Gender: F              HR:           99 bpm. Exam Location:  Inpatient Procedure: 2D Echo, Cardiac Doppler and Color Doppler Indications:    Pulmonary Embolus I26.09  History:        Patient has no prior history of Echocardiogram examinations. PAD                 and P.E.; Risk Factors:Diabetes, Hypertension, Non-Smoker and                 Dyslipidemia.  Sonographer:    Aron Baba Referring Phys: [id number] Verdene Lennert  Sonographer Comments: Technically challenging study due to limited acoustic windows and Technically difficult study due to poor echo windows. IMPRESSIONS  1. Cannot assess wall motion. Windows are limited. Left ventricular ejection fraction, by estimation, is 60 to 65%. The left ventricle has normal function. The left ventricle has no regional wall motion abnormalities. There is mild left ventricular hypertrophy. Left ventricular diastolic function could not be evaluated.  2. Right ventricular systolic function is normal. The right ventricular size is not well visualized. There is normal pulmonary artery systolic pressure. The estimated  right ventricular systolic pressure is 27.6 mmHg.  3. A small pericardial effusion is present.  4. The mitral valve was not well visualized. No evidence of mitral valve regurgitation.  5. Aortic valve regurgitation is not visualized.  6. The inferior vena cava is normal in size with greater than 50% respiratory variability, suggesting right atrial pressure of 3 mmHg. FINDINGS  Left Ventricle: Cannot assess wall motion. Windows are limited. Left ventricular ejection fraction, by estimation, is 60 to 65%. The left ventricle has normal function. The left ventricle has no regional wall motion abnormalities. The left ventricular internal cavity size was normal in size. There is mild left ventricular hypertrophy. Left ventricular diastolic function could not be evaluated. Right Ventricle: The right ventricular size is not well visualized. Right ventricular systolic function is normal. There is normal pulmonary artery systolic pressure. The tricuspid regurgitant velocity is 2.48 m/s, and with an assumed right atrial pressure of 3 mmHg, the estimated right ventricular systolic pressure is 27.6 mmHg. Left Atrium: Left atrial size was not well visualized. Right Atrium: Right atrial size was not well visualized. Pericardium: A small pericardial effusion is present. Mitral Valve: The mitral valve was not well visualized. No evidence of mitral valve regurgitation. Tricuspid Valve: Tricuspid valve regurgitation is mild. Aortic Valve: Aortic valve regurgitation is not visualized. Pulmonic Valve: The pulmonic valve was not well visualized. Pulmonic valve regurgitation is not visualized. Aorta: The aortic root and ascending aorta are structurally normal, with no evidence of dilitation. Venous: The inferior vena cava is normal in size with greater than 50% respiratory variability, suggesting right  every 6 (six) hours as needed for up to 20 doses for breakthrough pain.   Oyster Shell Calcium/D 250-125 MG-UNIT Tabs Take by mouth.   pantoprazole 40 MG tablet Commonly known as: PROTONIX Take 1 tablet (40 mg total) by mouth daily.        Contact information for follow-up providers     Kuneff, Renee A, DO. Schedule an appointment as soon as possible for a visit in 1 week(s).   Specialty: Family Medicine Contact information: [address] [place] [address] [phone]         Rodolph Bong, MD. Schedule an appointment as soon as possible for a visit in 1 week(s).   Specialties: Family Medicine, Emergency Medicine, Sports Medicine Contact information: [address] [place] [address] [phone]              Contact information for after-discharge care     Destination     [facility] .   Service: Skilled  Nursing Contact information: [address] [place] [address] [phone]                    No Known Allergies   Other Procedures/Studies: VAS [place] UPPER EXTREMITY VENOUS DUPLEX  Result Date: 02/28/2023 UPPER VENOUS STUDY  Patient Name:  Melissa [name]  Date of Exam:   02/28/2023 Medical Rec #: [id number]       Accession #:    [id number] Date of Birth: 21-Jan-1935        Patient Gender: F Patient Age:   88 years Exam Location:  [facility] Procedure:      VAS [place] UPPER EXTREMITY VENOUS DUPLEX Referring Phys: DAWOOD ELGERGAWY --------------------------------------------------------------------------------  Indications: pulmonary embolism Other Indications: Recent LUE fracture. Limitations: Limited range of motion due to pain (fractured proximal humerus). Comparison Study: No previous exams Performing Technologist: Jody Hill RVT, RDMS  Examination Guidelines: A complete evaluation includes B-mode imaging, spectral Doppler, color Doppler, and power Doppler as needed of all accessible portions of each vessel. Bilateral testing is considered an integral part of a complete examination. Limited examinations for reoccurring indications may be performed as noted.  Right Findings: +----------+------------+---------+-----------+----------+--------------------+ RIGHT     CompressiblePhasicitySpontaneousProperties      Summary        +----------+------------+---------+-----------+----------+--------------------+ Subclavian               Yes       Yes                   patent by                                                              color/doppler     +----------+------------+---------+-----------+----------+--------------------+  Left Findings: +----------+------------+---------+-----------+----------+--------------------+ LEFT      CompressiblePhasicitySpontaneousProperties      Summary         +----------+------------+---------+-----------+----------+--------------------+ IJV           Full       Yes       Yes                                   +----------+------------+---------+-----------+----------+--------------------+ Subclavian  Final    ECHOCARDIOGRAM COMPLETE  Result Date: 02/27/2023    ECHOCARDIOGRAM REPORT   Patient Name:   Melissa [name] Date of Exam: 02/27/2023 Medical Rec #:  [id number]      Height:       63.0 in Accession #:    [id number]     Weight:       122.8 lb Date of Birth:  1934-12-19       BSA:          1.572 m Patient Age:    88 years       BP:           99/62 mmHg Patient Gender: F              HR:           99 bpm. Exam Location:  Inpatient Procedure: 2D Echo, Cardiac Doppler and Color Doppler Indications:    Pulmonary Embolus I26.09  History:        Patient has no prior history of Echocardiogram examinations. PAD                 and P.E.; Risk Factors:Diabetes, Hypertension, Non-Smoker and                 Dyslipidemia.  Sonographer:    Aron Baba Referring Phys: [id number] Verdene Lennert  Sonographer Comments: Technically challenging study due to limited acoustic windows and Technically difficult study due to poor echo windows. IMPRESSIONS  1. Cannot assess wall motion. Windows are limited. Left ventricular ejection fraction, by estimation, is 60 to 65%. The left ventricle has normal function. The left ventricle has no regional wall motion abnormalities. There is mild left ventricular hypertrophy. Left ventricular diastolic function could not be evaluated.  2. Right ventricular systolic function is normal. The right ventricular size is not well visualized. There is normal pulmonary artery systolic pressure. The estimated  right ventricular systolic pressure is 27.6 mmHg.  3. A small pericardial effusion is present.  4. The mitral valve was not well visualized. No evidence of mitral valve regurgitation.  5. Aortic valve regurgitation is not visualized.  6. The inferior vena cava is normal in size with greater than 50% respiratory variability, suggesting right atrial pressure of 3 mmHg. FINDINGS  Left Ventricle: Cannot assess wall motion. Windows are limited. Left ventricular ejection fraction, by estimation, is 60 to 65%. The left ventricle has normal function. The left ventricle has no regional wall motion abnormalities. The left ventricular internal cavity size was normal in size. There is mild left ventricular hypertrophy. Left ventricular diastolic function could not be evaluated. Right Ventricle: The right ventricular size is not well visualized. Right ventricular systolic function is normal. There is normal pulmonary artery systolic pressure. The tricuspid regurgitant velocity is 2.48 m/s, and with an assumed right atrial pressure of 3 mmHg, the estimated right ventricular systolic pressure is 27.6 mmHg. Left Atrium: Left atrial size was not well visualized. Right Atrium: Right atrial size was not well visualized. Pericardium: A small pericardial effusion is present. Mitral Valve: The mitral valve was not well visualized. No evidence of mitral valve regurgitation. Tricuspid Valve: Tricuspid valve regurgitation is mild. Aortic Valve: Aortic valve regurgitation is not visualized. Pulmonic Valve: The pulmonic valve was not well visualized. Pulmonic valve regurgitation is not visualized. Aorta: The aortic root and ascending aorta are structurally normal, with no evidence of dilitation. Venous: The inferior vena cava is normal in size with greater than 50% respiratory variability, suggesting right  Final    ECHOCARDIOGRAM COMPLETE  Result Date: 02/27/2023    ECHOCARDIOGRAM REPORT   Patient Name:   Melissa [name] Date of Exam: 02/27/2023 Medical Rec #:  [id number]      Height:       63.0 in Accession #:    [id number]     Weight:       122.8 lb Date of Birth:  1934-12-19       BSA:          1.572 m Patient Age:    88 years       BP:           99/62 mmHg Patient Gender: F              HR:           99 bpm. Exam Location:  Inpatient Procedure: 2D Echo, Cardiac Doppler and Color Doppler Indications:    Pulmonary Embolus I26.09  History:        Patient has no prior history of Echocardiogram examinations. PAD                 and P.E.; Risk Factors:Diabetes, Hypertension, Non-Smoker and                 Dyslipidemia.  Sonographer:    Aron Baba Referring Phys: [id number] Verdene Lennert  Sonographer Comments: Technically challenging study due to limited acoustic windows and Technically difficult study due to poor echo windows. IMPRESSIONS  1. Cannot assess wall motion. Windows are limited. Left ventricular ejection fraction, by estimation, is 60 to 65%. The left ventricle has normal function. The left ventricle has no regional wall motion abnormalities. There is mild left ventricular hypertrophy. Left ventricular diastolic function could not be evaluated.  2. Right ventricular systolic function is normal. The right ventricular size is not well visualized. There is normal pulmonary artery systolic pressure. The estimated  right ventricular systolic pressure is 27.6 mmHg.  3. A small pericardial effusion is present.  4. The mitral valve was not well visualized. No evidence of mitral valve regurgitation.  5. Aortic valve regurgitation is not visualized.  6. The inferior vena cava is normal in size with greater than 50% respiratory variability, suggesting right atrial pressure of 3 mmHg. FINDINGS  Left Ventricle: Cannot assess wall motion. Windows are limited. Left ventricular ejection fraction, by estimation, is 60 to 65%. The left ventricle has normal function. The left ventricle has no regional wall motion abnormalities. The left ventricular internal cavity size was normal in size. There is mild left ventricular hypertrophy. Left ventricular diastolic function could not be evaluated. Right Ventricle: The right ventricular size is not well visualized. Right ventricular systolic function is normal. There is normal pulmonary artery systolic pressure. The tricuspid regurgitant velocity is 2.48 m/s, and with an assumed right atrial pressure of 3 mmHg, the estimated right ventricular systolic pressure is 27.6 mmHg. Left Atrium: Left atrial size was not well visualized. Right Atrium: Right atrial size was not well visualized. Pericardium: A small pericardial effusion is present. Mitral Valve: The mitral valve was not well visualized. No evidence of mitral valve regurgitation. Tricuspid Valve: Tricuspid valve regurgitation is mild. Aortic Valve: Aortic valve regurgitation is not visualized. Pulmonic Valve: The pulmonic valve was not well visualized. Pulmonic valve regurgitation is not visualized. Aorta: The aortic root and ascending aorta are structurally normal, with no evidence of dilitation. Venous: The inferior vena cava is normal in size with greater than 50% respiratory variability, suggesting right  Final    ECHOCARDIOGRAM COMPLETE  Result Date: 02/27/2023    ECHOCARDIOGRAM REPORT   Patient Name:   Melissa [name] Date of Exam: 02/27/2023 Medical Rec #:  [id number]      Height:       63.0 in Accession #:    [id number]     Weight:       122.8 lb Date of Birth:  1934-12-19       BSA:          1.572 m Patient Age:    88 years       BP:           99/62 mmHg Patient Gender: F              HR:           99 bpm. Exam Location:  Inpatient Procedure: 2D Echo, Cardiac Doppler and Color Doppler Indications:    Pulmonary Embolus I26.09  History:        Patient has no prior history of Echocardiogram examinations. PAD                 and P.E.; Risk Factors:Diabetes, Hypertension, Non-Smoker and                 Dyslipidemia.  Sonographer:    Aron Baba Referring Phys: [id number] Verdene Lennert  Sonographer Comments: Technically challenging study due to limited acoustic windows and Technically difficult study due to poor echo windows. IMPRESSIONS  1. Cannot assess wall motion. Windows are limited. Left ventricular ejection fraction, by estimation, is 60 to 65%. The left ventricle has normal function. The left ventricle has no regional wall motion abnormalities. There is mild left ventricular hypertrophy. Left ventricular diastolic function could not be evaluated.  2. Right ventricular systolic function is normal. The right ventricular size is not well visualized. There is normal pulmonary artery systolic pressure. The estimated  right ventricular systolic pressure is 27.6 mmHg.  3. A small pericardial effusion is present.  4. The mitral valve was not well visualized. No evidence of mitral valve regurgitation.  5. Aortic valve regurgitation is not visualized.  6. The inferior vena cava is normal in size with greater than 50% respiratory variability, suggesting right atrial pressure of 3 mmHg. FINDINGS  Left Ventricle: Cannot assess wall motion. Windows are limited. Left ventricular ejection fraction, by estimation, is 60 to 65%. The left ventricle has normal function. The left ventricle has no regional wall motion abnormalities. The left ventricular internal cavity size was normal in size. There is mild left ventricular hypertrophy. Left ventricular diastolic function could not be evaluated. Right Ventricle: The right ventricular size is not well visualized. Right ventricular systolic function is normal. There is normal pulmonary artery systolic pressure. The tricuspid regurgitant velocity is 2.48 m/s, and with an assumed right atrial pressure of 3 mmHg, the estimated right ventricular systolic pressure is 27.6 mmHg. Left Atrium: Left atrial size was not well visualized. Right Atrium: Right atrial size was not well visualized. Pericardium: A small pericardial effusion is present. Mitral Valve: The mitral valve was not well visualized. No evidence of mitral valve regurgitation. Tricuspid Valve: Tricuspid valve regurgitation is mild. Aortic Valve: Aortic valve regurgitation is not visualized. Pulmonic Valve: The pulmonic valve was not well visualized. Pulmonic valve regurgitation is not visualized. Aorta: The aortic root and ascending aorta are structurally normal, with no evidence of dilitation. Venous: The inferior vena cava is normal in size with greater than 50% respiratory variability, suggesting right  every 6 (six) hours as needed for up to 20 doses for breakthrough pain.   Oyster Shell Calcium/D 250-125 MG-UNIT Tabs Take by mouth.   pantoprazole 40 MG tablet Commonly known as: PROTONIX Take 1 tablet (40 mg total) by mouth daily.        Contact information for follow-up providers     Kuneff, Renee A, DO. Schedule an appointment as soon as possible for a visit in 1 week(s).   Specialty: Family Medicine Contact information: [address] [place] [address] [phone]         Rodolph Bong, MD. Schedule an appointment as soon as possible for a visit in 1 week(s).   Specialties: Family Medicine, Emergency Medicine, Sports Medicine Contact information: [address] [place] [address] [phone]              Contact information for after-discharge care     Destination     [facility] .   Service: Skilled  Nursing Contact information: [address] [place] [address] [phone]                    No Known Allergies   Other Procedures/Studies: VAS [place] UPPER EXTREMITY VENOUS DUPLEX  Result Date: 02/28/2023 UPPER VENOUS STUDY  Patient Name:  Melissa [name]  Date of Exam:   02/28/2023 Medical Rec #: [id number]       Accession #:    [id number] Date of Birth: 21-Jan-1935        Patient Gender: F Patient Age:   88 years Exam Location:  [facility] Procedure:      VAS [place] UPPER EXTREMITY VENOUS DUPLEX Referring Phys: DAWOOD ELGERGAWY --------------------------------------------------------------------------------  Indications: pulmonary embolism Other Indications: Recent LUE fracture. Limitations: Limited range of motion due to pain (fractured proximal humerus). Comparison Study: No previous exams Performing Technologist: Jody Hill RVT, RDMS  Examination Guidelines: A complete evaluation includes B-mode imaging, spectral Doppler, color Doppler, and power Doppler as needed of all accessible portions of each vessel. Bilateral testing is considered an integral part of a complete examination. Limited examinations for reoccurring indications may be performed as noted.  Right Findings: +----------+------------+---------+-----------+----------+--------------------+ RIGHT     CompressiblePhasicitySpontaneousProperties      Summary        +----------+------------+---------+-----------+----------+--------------------+ Subclavian               Yes       Yes                   patent by                                                              color/doppler     +----------+------------+---------+-----------+----------+--------------------+  Left Findings: +----------+------------+---------+-----------+----------+--------------------+ LEFT      CompressiblePhasicitySpontaneousProperties      Summary         +----------+------------+---------+-----------+----------+--------------------+ IJV           Full       Yes       Yes                                   +----------+------------+---------+-----------+----------+--------------------+ Subclavian  every 6 (six) hours as needed for up to 20 doses for breakthrough pain.   Oyster Shell Calcium/D 250-125 MG-UNIT Tabs Take by mouth.   pantoprazole 40 MG tablet Commonly known as: PROTONIX Take 1 tablet (40 mg total) by mouth daily.        Contact information for follow-up providers     Kuneff, Renee A, DO. Schedule an appointment as soon as possible for a visit in 1 week(s).   Specialty: Family Medicine Contact information: [address] [place] [address] [phone]         Rodolph Bong, MD. Schedule an appointment as soon as possible for a visit in 1 week(s).   Specialties: Family Medicine, Emergency Medicine, Sports Medicine Contact information: [address] [place] [address] [phone]              Contact information for after-discharge care     Destination     [facility] .   Service: Skilled  Nursing Contact information: [address] [place] [address] [phone]                    No Known Allergies   Other Procedures/Studies: VAS [place] UPPER EXTREMITY VENOUS DUPLEX  Result Date: 02/28/2023 UPPER VENOUS STUDY  Patient Name:  Melissa [name]  Date of Exam:   02/28/2023 Medical Rec #: [id number]       Accession #:    [id number] Date of Birth: 21-Jan-1935        Patient Gender: F Patient Age:   88 years Exam Location:  [facility] Procedure:      VAS [place] UPPER EXTREMITY VENOUS DUPLEX Referring Phys: DAWOOD ELGERGAWY --------------------------------------------------------------------------------  Indications: pulmonary embolism Other Indications: Recent LUE fracture. Limitations: Limited range of motion due to pain (fractured proximal humerus). Comparison Study: No previous exams Performing Technologist: Jody Hill RVT, RDMS  Examination Guidelines: A complete evaluation includes B-mode imaging, spectral Doppler, color Doppler, and power Doppler as needed of all accessible portions of each vessel. Bilateral testing is considered an integral part of a complete examination. Limited examinations for reoccurring indications may be performed as noted.  Right Findings: +----------+------------+---------+-----------+----------+--------------------+ RIGHT     CompressiblePhasicitySpontaneousProperties      Summary        +----------+------------+---------+-----------+----------+--------------------+ Subclavian               Yes       Yes                   patent by                                                              color/doppler     +----------+------------+---------+-----------+----------+--------------------+  Left Findings: +----------+------------+---------+-----------+----------+--------------------+ LEFT      CompressiblePhasicitySpontaneousProperties      Summary         +----------+------------+---------+-----------+----------+--------------------+ IJV           Full       Yes       Yes                                   +----------+------------+---------+-----------+----------+--------------------+ Subclavian  every 6 (six) hours as needed for up to 20 doses for breakthrough pain.   Oyster Shell Calcium/D 250-125 MG-UNIT Tabs Take by mouth.   pantoprazole 40 MG tablet Commonly known as: PROTONIX Take 1 tablet (40 mg total) by mouth daily.        Contact information for follow-up providers     Kuneff, Renee A, DO. Schedule an appointment as soon as possible for a visit in 1 week(s).   Specialty: Family Medicine Contact information: [address] [place] [address] [phone]         Rodolph Bong, MD. Schedule an appointment as soon as possible for a visit in 1 week(s).   Specialties: Family Medicine, Emergency Medicine, Sports Medicine Contact information: [address] [place] [address] [phone]              Contact information for after-discharge care     Destination     [facility] .   Service: Skilled  Nursing Contact information: [address] [place] [address] [phone]                    No Known Allergies   Other Procedures/Studies: VAS [place] UPPER EXTREMITY VENOUS DUPLEX  Result Date: 02/28/2023 UPPER VENOUS STUDY  Patient Name:  Melissa [name]  Date of Exam:   02/28/2023 Medical Rec #: [id number]       Accession #:    [id number] Date of Birth: 21-Jan-1935        Patient Gender: F Patient Age:   88 years Exam Location:  [facility] Procedure:      VAS [place] UPPER EXTREMITY VENOUS DUPLEX Referring Phys: DAWOOD ELGERGAWY --------------------------------------------------------------------------------  Indications: pulmonary embolism Other Indications: Recent LUE fracture. Limitations: Limited range of motion due to pain (fractured proximal humerus). Comparison Study: No previous exams Performing Technologist: Jody Hill RVT, RDMS  Examination Guidelines: A complete evaluation includes B-mode imaging, spectral Doppler, color Doppler, and power Doppler as needed of all accessible portions of each vessel. Bilateral testing is considered an integral part of a complete examination. Limited examinations for reoccurring indications may be performed as noted.  Right Findings: +----------+------------+---------+-----------+----------+--------------------+ RIGHT     CompressiblePhasicitySpontaneousProperties      Summary        +----------+------------+---------+-----------+----------+--------------------+ Subclavian               Yes       Yes                   patent by                                                              color/doppler     +----------+------------+---------+-----------+----------+--------------------+  Left Findings: +----------+------------+---------+-----------+----------+--------------------+ LEFT      CompressiblePhasicitySpontaneousProperties      Summary         +----------+------------+---------+-----------+----------+--------------------+ IJV           Full       Yes       Yes                                   +----------+------------+---------+-----------+----------+--------------------+ Subclavian  Final    ECHOCARDIOGRAM COMPLETE  Result Date: 02/27/2023    ECHOCARDIOGRAM REPORT   Patient Name:   Melissa [name] Date of Exam: 02/27/2023 Medical Rec #:  [id number]      Height:       63.0 in Accession #:    [id number]     Weight:       122.8 lb Date of Birth:  1934-12-19       BSA:          1.572 m Patient Age:    88 years       BP:           99/62 mmHg Patient Gender: F              HR:           99 bpm. Exam Location:  Inpatient Procedure: 2D Echo, Cardiac Doppler and Color Doppler Indications:    Pulmonary Embolus I26.09  History:        Patient has no prior history of Echocardiogram examinations. PAD                 and P.E.; Risk Factors:Diabetes, Hypertension, Non-Smoker and                 Dyslipidemia.  Sonographer:    Aron Baba Referring Phys: [id number] Verdene Lennert  Sonographer Comments: Technically challenging study due to limited acoustic windows and Technically difficult study due to poor echo windows. IMPRESSIONS  1. Cannot assess wall motion. Windows are limited. Left ventricular ejection fraction, by estimation, is 60 to 65%. The left ventricle has normal function. The left ventricle has no regional wall motion abnormalities. There is mild left ventricular hypertrophy. Left ventricular diastolic function could not be evaluated.  2. Right ventricular systolic function is normal. The right ventricular size is not well visualized. There is normal pulmonary artery systolic pressure. The estimated  right ventricular systolic pressure is 27.6 mmHg.  3. A small pericardial effusion is present.  4. The mitral valve was not well visualized. No evidence of mitral valve regurgitation.  5. Aortic valve regurgitation is not visualized.  6. The inferior vena cava is normal in size with greater than 50% respiratory variability, suggesting right atrial pressure of 3 mmHg. FINDINGS  Left Ventricle: Cannot assess wall motion. Windows are limited. Left ventricular ejection fraction, by estimation, is 60 to 65%. The left ventricle has normal function. The left ventricle has no regional wall motion abnormalities. The left ventricular internal cavity size was normal in size. There is mild left ventricular hypertrophy. Left ventricular diastolic function could not be evaluated. Right Ventricle: The right ventricular size is not well visualized. Right ventricular systolic function is normal. There is normal pulmonary artery systolic pressure. The tricuspid regurgitant velocity is 2.48 m/s, and with an assumed right atrial pressure of 3 mmHg, the estimated right ventricular systolic pressure is 27.6 mmHg. Left Atrium: Left atrial size was not well visualized. Right Atrium: Right atrial size was not well visualized. Pericardium: A small pericardial effusion is present. Mitral Valve: The mitral valve was not well visualized. No evidence of mitral valve regurgitation. Tricuspid Valve: Tricuspid valve regurgitation is mild. Aortic Valve: Aortic valve regurgitation is not visualized. Pulmonic Valve: The pulmonic valve was not well visualized. Pulmonic valve regurgitation is not visualized. Aorta: The aortic root and ascending aorta are structurally normal, with no evidence of dilitation. Venous: The inferior vena cava is normal in size with greater than 50% respiratory variability, suggesting right

## 2023-03-02 NOTE — TOC Progression Note (Addendum)
Transition of Care [facility]) - Progression Note    Patient Details  Name: Melissa [name] MRN: [id number] Date of Birth: 01-05-1935  [facility] CM/SW Contact  Mearl Latin, LCSW Phone Number: 03/02/2023, 9:01 AM  Clinical Narrative:    [name] awaiting insurance approval. Updated patient's son and patient.   Expected Discharge Plan: Skilled Nursing Facility Barriers to Discharge: Continued Medical Work up, English as a second language teacher  Expected Discharge Plan and Services In-house Referral: Clinical Social Work Discharge Planning Services: [employer] Consult Post Acute Care Choice: Skilled Nursing Facility Living arrangements for the past 2 months: Single Family Home                                       Social Determinants of Health (SDOH) Interventions SDOH Screenings   Food Insecurity: No Food Insecurity (02/26/2023)  Housing: Low Risk  (02/26/2023)  Transportation Needs: No Transportation Needs (02/26/2023)  Utilities: Not At Risk (02/26/2023)  Alcohol Screen: Low Risk  (08/26/2020)  Depression (PHQ2-9): Low Risk  (12/26/2022)  Financial Resource Strain: Low Risk  (10/26/2022)  Physical Activity: Insufficiently Active (10/26/2022)  Social Connections: Moderately Integrated (10/26/2022)  Stress: No Stress Concern Present (10/26/2022)  Tobacco Use: Low Risk  (02/25/2023)    Readmission Risk Interventions     No data to display

## 2023-03-02 NOTE — Plan of Care (Signed)

## 2023-03-03 DIAGNOSIS — I10 Essential (primary) hypertension: Secondary | ICD-10-CM | POA: Diagnosis not present

## 2023-03-03 DIAGNOSIS — E113213 Type 2 diabetes mellitus with mild nonproliferative diabetic retinopathy with macular edema, bilateral: Secondary | ICD-10-CM | POA: Diagnosis not present

## 2023-03-03 DIAGNOSIS — E441 Mild protein-calorie malnutrition: Secondary | ICD-10-CM | POA: Diagnosis not present

## 2023-03-03 DIAGNOSIS — K219 Gastro-esophageal reflux disease without esophagitis: Secondary | ICD-10-CM | POA: Diagnosis not present

## 2023-03-03 DIAGNOSIS — E119 Type 2 diabetes mellitus without complications: Secondary | ICD-10-CM | POA: Diagnosis not present

## 2023-03-03 DIAGNOSIS — R269 Unspecified abnormalities of gait and mobility: Secondary | ICD-10-CM | POA: Diagnosis not present

## 2023-03-03 DIAGNOSIS — R41841 Cognitive communication deficit: Secondary | ICD-10-CM | POA: Diagnosis not present

## 2023-03-03 DIAGNOSIS — D649 Anemia, unspecified: Secondary | ICD-10-CM | POA: Diagnosis not present

## 2023-03-03 DIAGNOSIS — F339 Major depressive disorder, recurrent, unspecified: Secondary | ICD-10-CM | POA: Diagnosis not present

## 2023-03-03 DIAGNOSIS — I739 Peripheral vascular disease, unspecified: Secondary | ICD-10-CM | POA: Diagnosis not present

## 2023-03-03 DIAGNOSIS — F419 Anxiety disorder, unspecified: Secondary | ICD-10-CM | POA: Diagnosis not present

## 2023-03-03 DIAGNOSIS — R002 Palpitations: Secondary | ICD-10-CM | POA: Diagnosis not present

## 2023-03-03 DIAGNOSIS — M6281 Muscle weakness (generalized): Secondary | ICD-10-CM | POA: Diagnosis not present

## 2023-03-03 DIAGNOSIS — I2699 Other pulmonary embolism without acute cor pulmonale: Secondary | ICD-10-CM | POA: Diagnosis not present

## 2023-03-03 DIAGNOSIS — S42302D Unspecified fracture of shaft of humerus, left arm, subsequent encounter for fracture with routine healing: Secondary | ICD-10-CM | POA: Diagnosis not present

## 2023-03-03 LAB — GLUCOSE, CAPILLARY: Glucose-Capillary: 116 mg/dL — ABNORMAL HIGH (ref 70–99)

## 2023-03-03 NOTE — Progress Notes (Signed)
Subjective: No complaints-awaiting SNF placement.  Lying comfortably in bed  Objective: Blood pressure (!) 109/49, pulse 93, temperature 97.8 F (36.6 C), temperature source Oral, resp. rate 19, weight 54.7 kg, SpO2 96%.   Chest: Clear to auscultation CVS: S1-S2 regular Abdomen: Soft nontender Extremities: No edema Neurology: Awake/alert/nonfocal  Assessment/plan: Pulmonary embolism: Eliquis/hematology evaluation as an outpatient Left humeral fracture: Continue sling-follow with sports medicine Debility/deconditioning: SNF when bed available  Medically stable for discharge if SNF bed available today

## 2023-03-03 NOTE — Plan of Care (Signed)

## 2023-03-03 NOTE — TOC Transition Note (Signed)
Transition of Care [facility]) - CM/SW Discharge Note   Patient Details  Name: Melissa [name] MRN: [id number] Date of Birth: 06-19-1935  Transition of Care [facility]) CM/SW Contact:  Mearl Latin, LCSW Phone Number: 03/03/2023, 9:17 AM   Clinical Narrative:    Patient will DC to: [facility] Anticipated DC date: 03/03/23 Family notified: Melissa [name] Transport by: Son Melissa [name] via car   Per MD patient ready for DC to [facility]. RN to call report prior to discharge [phone]). RN, patient, patient's family, and facility notified of DC. Discharge Summary and FL2 sent to facility. DC packet on chart including signed script. Please send any creams or inhalers in the room already with patient.   CSW will sign off for now as social work intervention is no longer needed. Please consult [place] again if new needs arise.     Final next level of care: Skilled Nursing Facility Barriers to Discharge: Barriers Resolved   Patient Goals and CMS Choice CMS Medicare.gov Compare Post Acute Care list provided to:: Patient Choice offered to / list presented to : Patient, Adult Children  Discharge Placement     Existing PASRR number confirmed : 03/03/23          Patient chooses bed at: [facility] Patient to be transferred to facility by: Car Name of family member notified: Melissa [name] Patient and family notified of of transfer: 03/03/23  Discharge Plan and Services Additional resources added to the After Visit Summary for   In-house Referral: Clinical Social Work Discharge Planning Services: CM Consult Post Acute Care Choice: Skilled Nursing Facility                               Social Determinants of Health (SDOH) Interventions SDOH Screenings   Food Insecurity: No Food Insecurity (02/26/2023)  Housing: Low Risk  (02/26/2023)  Transportation Needs: No Transportation Needs (02/26/2023)  Utilities: Not At Risk (02/26/2023)  Alcohol Screen: Low Risk  (08/26/2020)  Depression (PHQ2-9): Low Risk   (12/26/2022)  Financial Resource Strain: Low Risk  (10/26/2022)  Physical Activity: Insufficiently Active (10/26/2022)  Social Connections: Moderately Integrated (10/26/2022)  Stress: No Stress Concern Present (10/26/2022)  Tobacco Use: Low Risk  (02/25/2023)     Readmission Risk Interventions     No data to display

## 2023-03-03 NOTE — Progress Notes (Signed)
Mobility Specialist Progress Note:   03/03/23 0950  Mobility  Activity Ambulated with assistance in hallway  Level of Assistance Contact guard assist, steadying assist  Assistive Device Other (Comment) (HHA)  Distance Ambulated (ft) 400 ft  LUE Weight Bearing NWB  Activity Response Tolerated well  Mobility Referral Yes  $Mobility charge 1 Mobility  Mobility Specialist Start Time (ACUTE ONLY) 0950  Mobility Specialist Stop Time (ACUTE ONLY) 1003  Mobility Specialist Time Calculation (min) (ACUTE ONLY) 13 min   Pt agreeable to mobility session. Required steadying assist via HHA throughout ambulation. C/o minor dizziness in the mornings, no other complaints. Back in bed with all needs met, eager for d/c.  [name] Mobility Specialist Please contact via SecureChat or  Rehab office at [phone]

## 2023-03-03 NOTE — Progress Notes (Signed)
Occupational Therapy Treatment Patient Details Name: Melissa [name] MRN: [id number] DOB: 12/18/1934 Today's Date: 03/03/2023   History of present illness 88 y.o. female who presented to [facility] ED due to elevated heart rate. CTA +PE; bil LE dopplers pending  PMH significant of recent shoulder fracture, T2DM, HLD, burning tongue syndrome, macular degeneration.   OT comments  This 88 yo female seen for LUE exercises at bed level and education on donning shirts. Pt still with pain in LUE for elbow exercises but does have full AROM. She will continue to benefit from acute OT with follow up from continued inpatient follow up therapy, <3 hours/day.       If plan is discharge home, recommend the following:  A little help with walking and/or transfers;A lot of help with bathing/dressing/bathroom;Assistance with cooking/housework;Assist for transportation;Assistance with feeding;Direct supervision/assist for medications management;Direct supervision/assist for financial management   Equipment Recommendations  None recommended by OT    Recommendations for Other Services      Precautions / Restrictions Precautions Precautions: Fall Precaution Comments: recent  L humerus fx; OK for elbow/wrist/hand ROM Restrictions Weight Bearing Restrictions: Yes LUE Weight Bearing: Non weight bearing Other Position/Activity Restrictions: sling, seeing Dr Dion Saucier       Mobility Bed Mobility               General bed mobility comments: Pt able to use her RUE on bottom rail to pull herself forward (back off of bed) while HOB raised in order for me remove, re-apply and adjust shoulder immobilizer sling.          ADL either performed or assessed with clinical judgement   ADL                                         General ADL Comments: Pt is unsure of what her son is bringing her to wear to transfer to SNF. Educated her on if it is a Psychologist, occupational to thread RUE in shirt and  pull other side of shirt down over slinged arm (sling will be under the shirt). If the top he brings her opens in the front then thread her LUE in shirt first (leaning towards her left side so gravity moves her arm away from her body--she does not actively move her left shoulder) and then put the RUE in with ending by closing front of shirt.    Extremity/Trunk Assessment Upper Extremity Assessment Upper Extremity Assessment: RUE deficits/detail;LUE deficits/detail LUE Deficits / Details: tolerated AROM L elbow through full functional range x 10; forearm/wrist/hand AROM WFL x 10 reps of each            Vision Patient Visual Report: No change from baseline            Cognition Arousal: Alert Behavior During Therapy: WFL for tasks assessed/performed Overall Cognitive Status: Within Functional Limits for tasks assessed                                                     Pertinent Vitals/ Pain       Pain Assessment Pain Assessment: 0-10 Pain Score: 7  Pain Location: L shoulder Pain Descriptors / Indicators: Aching, Discomfort, Grimacing, Guarding Pain Intervention(s): Limited activity within patient's tolerance,  Monitored during session, Repositioned         Frequency  Min 1X/week        Progress Toward Goals  OT Goals(current goals can now be found in the care plan section)  Progress towards OT goals: Progressing toward goals  Acute Rehab OT Goals Patient Stated Goal: to go to rehab and get better OT Goal Formulation: With patient Time For Goal Achievement: 03/13/23 Potential to Achieve Goals: Good         AM-PAC OT "6 Clicks" Daily Activity     Outcome Measure   Help from another person eating meals?: A Little Help from another person taking care of personal grooming?: A Little Help from another person toileting, which includes using toliet, bedpan, or urinal?: A Lot Help from another person bathing (including washing, rinsing, drying)?:  A Lot Help from another person to put on and taking off regular upper body clothing?: A Lot Help from another person to put on and taking off regular lower body clothing?: A Lot 6 Click Score: 14    End of Session    OT Visit Diagnosis: Muscle weakness (generalized) (M62.81);Pain Pain - Right/Left: Left Pain - part of body: Shoulder   Activity Tolerance Patient tolerated treatment well   Patient Left in bed;with call bell/phone within reach;with bed alarm set   Nurse Communication  (how to A pt with donning shirts depending on what type of shirt son brings (refer to ADL section to note))        Time: 7425-9563 OT Time Calculation (min): 17 min  Charges: OT General Charges $OT Visit: 1 Visit OT Treatments $Therapeutic Exercise: 8-22 mins  Melissa [name] OT Acute Rehabilitation Services Office [phone]   Melissa [name] 03/03/2023, 10:11 AM

## 2023-03-03 NOTE — TOC Progression Note (Signed)
Transition of Care [facility]) - Progression Note    Patient Details  Name: Melissa [name] MRN: [id number] Date of Birth: 11-May-1935  Transition of Care [facility]) CM/SW Contact  Mearl Latin, LCSW Phone Number: 03/03/2023, 8:35 AM  Clinical Narrative:    [name] has received insurance approval. CSW spoke with patient's son, Bernette Redbird, and he will pick patient up around 10am.    Expected Discharge Plan: Skilled Nursing Facility Barriers to Discharge: Continued Medical Work up, English as a second language teacher  Expected Discharge Plan and Services In-house Referral: Clinical Social Work Discharge Planning Services: [employer] Consult Post Acute Care Choice: Skilled Nursing Facility Living arrangements for the past 2 months: Single Family Home Expected Discharge Date: 03/02/23                                     Social Determinants of Health (SDOH) Interventions SDOH Screenings   Food Insecurity: No Food Insecurity (02/26/2023)  Housing: Low Risk  (02/26/2023)  Transportation Needs: No Transportation Needs (02/26/2023)  Utilities: Not At Risk (02/26/2023)  Alcohol Screen: Low Risk  (08/26/2020)  Depression (PHQ2-9): Low Risk  (12/26/2022)  Financial Resource Strain: Low Risk  (10/26/2022)  Physical Activity: Insufficiently Active (10/26/2022)  Social Connections: Moderately Integrated (10/26/2022)  Stress: No Stress Concern Present (10/26/2022)  Tobacco Use: Low Risk  (02/25/2023)    Readmission Risk Interventions     No data to display

## 2023-03-06 DIAGNOSIS — I2699 Other pulmonary embolism without acute cor pulmonale: Secondary | ICD-10-CM | POA: Diagnosis not present

## 2023-03-06 DIAGNOSIS — R5381 Other malaise: Secondary | ICD-10-CM | POA: Diagnosis not present

## 2023-03-07 DIAGNOSIS — E441 Mild protein-calorie malnutrition: Secondary | ICD-10-CM | POA: Diagnosis not present

## 2023-03-07 DIAGNOSIS — K219 Gastro-esophageal reflux disease without esophagitis: Secondary | ICD-10-CM | POA: Diagnosis not present

## 2023-03-07 DIAGNOSIS — D649 Anemia, unspecified: Secondary | ICD-10-CM | POA: Diagnosis not present

## 2023-03-07 DIAGNOSIS — R5381 Other malaise: Secondary | ICD-10-CM | POA: Diagnosis not present

## 2023-03-07 DIAGNOSIS — F419 Anxiety disorder, unspecified: Secondary | ICD-10-CM | POA: Diagnosis not present

## 2023-03-07 DIAGNOSIS — I2699 Other pulmonary embolism without acute cor pulmonale: Secondary | ICD-10-CM | POA: Diagnosis not present

## 2023-03-07 DIAGNOSIS — S42302D Unspecified fracture of shaft of humerus, left arm, subsequent encounter for fracture with routine healing: Secondary | ICD-10-CM | POA: Diagnosis not present

## 2023-03-07 DIAGNOSIS — E119 Type 2 diabetes mellitus without complications: Secondary | ICD-10-CM | POA: Diagnosis not present

## 2023-03-07 DIAGNOSIS — I10 Essential (primary) hypertension: Secondary | ICD-10-CM | POA: Diagnosis not present

## 2023-03-07 DIAGNOSIS — I739 Peripheral vascular disease, unspecified: Secondary | ICD-10-CM | POA: Diagnosis not present

## 2023-03-10 ENCOUNTER — Ambulatory Visit: Payer: No Typology Code available for payment source | Admitting: Family Medicine

## 2023-03-10 DIAGNOSIS — D649 Anemia, unspecified: Secondary | ICD-10-CM | POA: Diagnosis not present

## 2023-03-10 DIAGNOSIS — I2699 Other pulmonary embolism without acute cor pulmonale: Secondary | ICD-10-CM | POA: Diagnosis not present

## 2023-03-10 DIAGNOSIS — E119 Type 2 diabetes mellitus without complications: Secondary | ICD-10-CM | POA: Diagnosis not present

## 2023-03-16 ENCOUNTER — Ambulatory Visit: Payer: No Typology Code available for payment source

## 2023-03-16 ENCOUNTER — Ambulatory Visit: Payer: No Typology Code available for payment source | Admitting: Family Medicine

## 2023-03-16 VITALS — BP 104/62 | HR 110 | Ht 63.0 in

## 2023-03-16 DIAGNOSIS — S42295A Other nondisplaced fracture of upper end of left humerus, initial encounter for closed fracture: Secondary | ICD-10-CM

## 2023-03-16 DIAGNOSIS — G8929 Other chronic pain: Secondary | ICD-10-CM | POA: Diagnosis not present

## 2023-03-16 DIAGNOSIS — M5442 Lumbago with sciatica, left side: Secondary | ICD-10-CM

## 2023-03-16 DIAGNOSIS — M19012 Primary osteoarthritis, left shoulder: Secondary | ICD-10-CM | POA: Diagnosis not present

## 2023-03-16 DIAGNOSIS — S42352D Displaced comminuted fracture of shaft of humerus, left arm, subsequent encounter for fracture with routine healing: Secondary | ICD-10-CM | POA: Diagnosis not present

## 2023-03-16 DIAGNOSIS — M25552 Pain in left hip: Secondary | ICD-10-CM | POA: Diagnosis not present

## 2023-03-16 DIAGNOSIS — N390 Urinary tract infection, site not specified: Secondary | ICD-10-CM | POA: Diagnosis not present

## 2023-03-16 NOTE — Patient Instructions (Signed)
Thank you for coming in today.   Plan for home health PT.  If that will not work we could try PT in the office in [place] but I am worried about you driving.   Recheck in about 1 month.   Let me know if this is not working.

## 2023-03-16 NOTE — Progress Notes (Signed)
   [name], PhD, LAT, ATC acting as a scribe for Melissa Graham, MD.  Melissa [name] is a 88 y.o. female who presents to [facility] today for f/u low back and L posterior hip pain. Of note she suffered a fx of the proximal aspect of her L humerus on 8/31 and was admitted to the hospital on the 7th for a PE. Pt was last seen by Dr. Denyse Amass on 02/16/23 and she was referred to PT in [place]. Gabapentin was refilled and she was prescribed prednisone as a back up.  Today, pt reports her LBP has gotten better. She had gotten her rx mixed up and it caused her to fall, fx her humerus. Her L proximal upper arm is painful. She has been wearing her sling. She is currently staying a a rehab facility in [place].   She lives in [place].  She does drive intermittently.  Prior to her injury  Dx imaging: 02/18/23 L shoulder & L humerus XR   Pertinent review of systems: No fevers or chills  Relevant historical information: PAD and diabetes.   Exam:  BP 104/62   Pulse (!) 110   Ht 5\' 3"  (1.6 m)   SpO2 97%   BMI 21.36 kg/m  General: Well Developed, well nourished, and in no acute distress.   MSK: Left shoulder normal-appearing decreased range of motion.  Strength and full range of motion not tested.    Lab and Radiology Results  X-ray images left shoulder obtained today personally and independently interpreted. Healing humeral neck fracture with good alignment.  Callus formation is present. Await formal radiology review    Assessment and Plan: 88 y.o. female with proximal humerus fracture treated conservatively over the last month with immobilization.  X-rays today are reassuring.  The fracture is well aligned with good for callus formation.  It is not fully healed yet.  We can start using the shoulder immobilizer less and less and encourage shoulder range of motion.  She states that she will be discharged soon from her skilled nursing facility.  Plan for home  health physical therapy if able.  If not able I guess we could try outpatient physical therapy in [place].  She was driving before the injury but I am worried about her physically being able to drive a car right now.  Recheck in 1 month. PDMP not reviewed this encounter. Orders Placed This Encounter  Procedures   DG Shoulder Left    Standing Status:   Future    Number of Occurrences:   1    Standing Expiration Date:   04/15/2023    Order Specific Question:   Reason for Exam (SYMPTOM  OR DIAGNOSIS REQUIRED)    Answer:   left shoulder pain    Order Specific Question:   Preferred imaging location?    Answer:   [facility]   Ambulatory referral to Home Health    Referral Priority:   Routine    Referral Type:   Home Health Care    Referral Reason:   Specialty Services Required    Requested Specialty:   Home Health Services    Number of Visits Requested:   1   No orders of the defined types were placed in this encounter.    Discussed warning signs or symptoms. Please see discharge instructions. Patient expresses understanding.   The above documentation has been reviewed and is accurate and complete Melissa [name], M.D.

## 2023-03-17 DIAGNOSIS — R8290 Unspecified abnormal findings in urine: Secondary | ICD-10-CM | POA: Diagnosis not present

## 2023-03-20 DIAGNOSIS — K5790 Diverticulosis of intestine, part unspecified, without perforation or abscess without bleeding: Secondary | ICD-10-CM | POA: Diagnosis not present

## 2023-03-20 DIAGNOSIS — Z86711 Personal history of pulmonary embolism: Secondary | ICD-10-CM | POA: Diagnosis not present

## 2023-03-20 DIAGNOSIS — N281 Cyst of kidney, acquired: Secondary | ICD-10-CM | POA: Diagnosis not present

## 2023-03-20 DIAGNOSIS — R000 Tachycardia, unspecified: Secondary | ICD-10-CM | POA: Diagnosis not present

## 2023-03-20 DIAGNOSIS — E1151 Type 2 diabetes mellitus with diabetic peripheral angiopathy without gangrene: Secondary | ICD-10-CM | POA: Diagnosis not present

## 2023-03-20 DIAGNOSIS — E785 Hyperlipidemia, unspecified: Secondary | ICD-10-CM | POA: Diagnosis not present

## 2023-03-20 DIAGNOSIS — Z7984 Long term (current) use of oral hypoglycemic drugs: Secondary | ICD-10-CM | POA: Diagnosis not present

## 2023-03-20 DIAGNOSIS — I959 Hypotension, unspecified: Secondary | ICD-10-CM | POA: Diagnosis not present

## 2023-03-20 DIAGNOSIS — I10 Essential (primary) hypertension: Secondary | ICD-10-CM | POA: Diagnosis not present

## 2023-03-20 DIAGNOSIS — R932 Abnormal findings on diagnostic imaging of liver and biliary tract: Secondary | ICD-10-CM | POA: Diagnosis not present

## 2023-03-20 DIAGNOSIS — Z7901 Long term (current) use of anticoagulants: Secondary | ICD-10-CM | POA: Diagnosis not present

## 2023-03-20 DIAGNOSIS — K5730 Diverticulosis of large intestine without perforation or abscess without bleeding: Secondary | ICD-10-CM | POA: Diagnosis not present

## 2023-03-20 DIAGNOSIS — Z90710 Acquired absence of both cervix and uterus: Secondary | ICD-10-CM | POA: Diagnosis not present

## 2023-03-20 DIAGNOSIS — Z9181 History of falling: Secondary | ICD-10-CM | POA: Diagnosis not present

## 2023-03-20 DIAGNOSIS — F419 Anxiety disorder, unspecified: Secondary | ICD-10-CM | POA: Diagnosis not present

## 2023-03-20 DIAGNOSIS — Z87891 Personal history of nicotine dependence: Secondary | ICD-10-CM | POA: Diagnosis not present

## 2023-03-20 DIAGNOSIS — K449 Diaphragmatic hernia without obstruction or gangrene: Secondary | ICD-10-CM | POA: Diagnosis not present

## 2023-03-20 DIAGNOSIS — R110 Nausea: Secondary | ICD-10-CM | POA: Diagnosis not present

## 2023-03-20 DIAGNOSIS — M6281 Muscle weakness (generalized): Secondary | ICD-10-CM | POA: Diagnosis not present

## 2023-03-20 DIAGNOSIS — E119 Type 2 diabetes mellitus without complications: Secondary | ICD-10-CM | POA: Diagnosis not present

## 2023-03-20 DIAGNOSIS — R269 Unspecified abnormalities of gait and mobility: Secondary | ICD-10-CM | POA: Diagnosis not present

## 2023-03-20 DIAGNOSIS — Z79899 Other long term (current) drug therapy: Secondary | ICD-10-CM | POA: Diagnosis not present

## 2023-03-20 DIAGNOSIS — R112 Nausea with vomiting, unspecified: Secondary | ICD-10-CM | POA: Diagnosis not present

## 2023-03-20 DIAGNOSIS — K219 Gastro-esophageal reflux disease without esophagitis: Secondary | ICD-10-CM | POA: Diagnosis not present

## 2023-03-21 DIAGNOSIS — E1151 Type 2 diabetes mellitus with diabetic peripheral angiopathy without gangrene: Secondary | ICD-10-CM | POA: Diagnosis not present

## 2023-03-21 DIAGNOSIS — K219 Gastro-esophageal reflux disease without esophagitis: Secondary | ICD-10-CM | POA: Diagnosis not present

## 2023-03-21 DIAGNOSIS — F419 Anxiety disorder, unspecified: Secondary | ICD-10-CM | POA: Diagnosis not present

## 2023-03-21 DIAGNOSIS — E785 Hyperlipidemia, unspecified: Secondary | ICD-10-CM | POA: Diagnosis not present

## 2023-03-21 DIAGNOSIS — K449 Diaphragmatic hernia without obstruction or gangrene: Secondary | ICD-10-CM | POA: Diagnosis not present

## 2023-03-22 DIAGNOSIS — S42295A Other nondisplaced fracture of upper end of left humerus, initial encounter for closed fracture: Secondary | ICD-10-CM | POA: Diagnosis not present

## 2023-03-23 ENCOUNTER — Ambulatory Visit: Payer: No Typology Code available for payment source | Admitting: Family Medicine

## 2023-03-23 DIAGNOSIS — M546 Pain in thoracic spine: Secondary | ICD-10-CM | POA: Diagnosis not present

## 2023-03-23 DIAGNOSIS — R262 Difficulty in walking, not elsewhere classified: Secondary | ICD-10-CM | POA: Diagnosis not present

## 2023-03-27 ENCOUNTER — Telehealth: Payer: Self-pay

## 2023-03-27 NOTE — Telephone Encounter (Signed)
Caller/Agency: [facility]  Callback Number: [phone]  Requesting OT/PT/Skilled Nursing/Social Work/Speech Therapy: PT and OT  Frequency:  Evaluation only  PT and OT evaluation to start tomorrow 10/8

## 2023-03-28 DIAGNOSIS — M79602 Pain in left arm: Secondary | ICD-10-CM | POA: Diagnosis not present

## 2023-03-28 DIAGNOSIS — E119 Type 2 diabetes mellitus without complications: Secondary | ICD-10-CM | POA: Diagnosis not present

## 2023-03-28 DIAGNOSIS — S42292D Other displaced fracture of upper end of left humerus, subsequent encounter for fracture with routine healing: Secondary | ICD-10-CM | POA: Diagnosis not present

## 2023-03-28 DIAGNOSIS — W19XXXD Unspecified fall, subsequent encounter: Secondary | ICD-10-CM | POA: Diagnosis not present

## 2023-03-28 DIAGNOSIS — E785 Hyperlipidemia, unspecified: Secondary | ICD-10-CM | POA: Diagnosis not present

## 2023-03-28 DIAGNOSIS — Z79891 Long term (current) use of opiate analgesic: Secondary | ICD-10-CM | POA: Diagnosis not present

## 2023-03-28 DIAGNOSIS — Z7984 Long term (current) use of oral hypoglycemic drugs: Secondary | ICD-10-CM | POA: Diagnosis not present

## 2023-03-28 NOTE — Telephone Encounter (Signed)
Okay for orders? 

## 2023-03-28 NOTE — Telephone Encounter (Signed)
Sent to other provider in error.  Okay for orders?

## 2023-03-29 NOTE — Telephone Encounter (Signed)
Home health orders received 03/29/2023 for [facility] Home health initiation orders: Yes.  Home health re-certification orders: No. Patient last seen by ordering physician for this condition: NO. Must be less than 30 days prior for initiation. Visit must have been for the condition the orders are being placed.  Patient meets criteria for Physician to sign orders: No.  patient does not meet criteria for orders to be signed> Has not seen provider for this concern, She did not have a hosp. F/u appt. From her admission 9/7   [facility]

## 2023-03-30 DIAGNOSIS — E785 Hyperlipidemia, unspecified: Secondary | ICD-10-CM | POA: Diagnosis not present

## 2023-03-30 DIAGNOSIS — S42292D Other displaced fracture of upper end of left humerus, subsequent encounter for fracture with routine healing: Secondary | ICD-10-CM | POA: Diagnosis not present

## 2023-03-30 DIAGNOSIS — Z7984 Long term (current) use of oral hypoglycemic drugs: Secondary | ICD-10-CM | POA: Diagnosis not present

## 2023-03-30 DIAGNOSIS — Z79891 Long term (current) use of opiate analgesic: Secondary | ICD-10-CM | POA: Diagnosis not present

## 2023-03-30 DIAGNOSIS — M79602 Pain in left arm: Secondary | ICD-10-CM | POA: Diagnosis not present

## 2023-03-30 DIAGNOSIS — E119 Type 2 diabetes mellitus without complications: Secondary | ICD-10-CM | POA: Diagnosis not present

## 2023-03-30 DIAGNOSIS — W19XXXD Unspecified fall, subsequent encounter: Secondary | ICD-10-CM | POA: Diagnosis not present

## 2023-03-30 NOTE — Telephone Encounter (Addendum)
Contacted pt to schedule appt and she was currently being seen by [facility]. Will call again at better time to schedule.

## 2023-03-31 DIAGNOSIS — S42295D Other nondisplaced fracture of upper end of left humerus, subsequent encounter for fracture with routine healing: Secondary | ICD-10-CM | POA: Diagnosis not present

## 2023-03-31 NOTE — Telephone Encounter (Signed)
LM for pt to return call to discuss.  

## 2023-04-04 ENCOUNTER — Ambulatory Visit (INDEPENDENT_AMBULATORY_CARE_PROVIDER_SITE_OTHER): Payer: No Typology Code available for payment source | Admitting: Family Medicine

## 2023-04-04 ENCOUNTER — Telehealth: Payer: Self-pay | Admitting: Family Medicine

## 2023-04-04 VITALS — BP 116/62 | HR 106 | Temp 97.6°F | Wt 119.8 lb

## 2023-04-04 DIAGNOSIS — R000 Tachycardia, unspecified: Secondary | ICD-10-CM

## 2023-04-04 DIAGNOSIS — S42252D Displaced fracture of greater tuberosity of left humerus, subsequent encounter for fracture with routine healing: Secondary | ICD-10-CM

## 2023-04-04 DIAGNOSIS — I2699 Other pulmonary embolism without acute cor pulmonale: Secondary | ICD-10-CM | POA: Diagnosis not present

## 2023-04-04 DIAGNOSIS — M79602 Pain in left arm: Secondary | ICD-10-CM | POA: Diagnosis not present

## 2023-04-04 DIAGNOSIS — W19XXXD Unspecified fall, subsequent encounter: Secondary | ICD-10-CM | POA: Diagnosis not present

## 2023-04-04 DIAGNOSIS — S42292D Other displaced fracture of upper end of left humerus, subsequent encounter for fracture with routine healing: Secondary | ICD-10-CM | POA: Diagnosis not present

## 2023-04-04 DIAGNOSIS — Z79891 Long term (current) use of opiate analgesic: Secondary | ICD-10-CM | POA: Diagnosis not present

## 2023-04-04 DIAGNOSIS — Z7984 Long term (current) use of oral hypoglycemic drugs: Secondary | ICD-10-CM | POA: Diagnosis not present

## 2023-04-04 DIAGNOSIS — E119 Type 2 diabetes mellitus without complications: Secondary | ICD-10-CM | POA: Diagnosis not present

## 2023-04-04 DIAGNOSIS — E785 Hyperlipidemia, unspecified: Secondary | ICD-10-CM | POA: Diagnosis not present

## 2023-04-04 LAB — HM DIABETES EYE EXAM

## 2023-04-04 MED ORDER — METOPROLOL SUCCINATE ER 25 MG PO TB24: 25.0000 mg | ORAL_TABLET | Freq: Every day | ORAL | 1 refills | Status: DC

## 2023-04-04 NOTE — Progress Notes (Signed)
Melissa [name] , Sep 16, 1934, 88 y.o., female MRN: [id number] Patient Care Team    Relationship Specialty Notifications Start End  Natalia Leatherwood, DO PCP - General Family Medicine  07/03/18   Rankin, Alford Highland, MD Consulting Physician Ophthalmology  07/03/18   Allie Bossier, MD Consulting Physician Obstetrics and Gynecology  07/04/18   Lennette Bihari, MD Consulting Physician Cardiology  07/04/18   Patton Salles, MD Consulting Physician Obstetrics and Gynecology  02/18/20   Ponciano Ort, [facility]    12/01/21     Chief Complaint  Patient presents with   Pulmonary embolism     Subjective: NICHOL Melissa [name] is a 88 y.o. Pt presents for an follow-up after multiple emergency room admissions, hospital admissions for recent fracture of humerus and then development of pulmonary embolism. Presents today with her son. Patient reports compliance with Eliquis and is taking correctly.  She currently is taking the Eliquis 5 mg twice daily.  Patient had had a recent admission with humerus fracture prior to onset of PE.  Inpatient team commented they felt it may have been an unprovoked PE, therefore requiring heme-onc to weigh in.  Fracture of humerus 02/18/2023 from mechanical fall.  Followed up with Dr. Denyse Amass 9/26>referred to PT in [place]> gabapentin/pred-patient reports she is recovering okay.  Pulmonary embolism: Admitted 02/25/2023- 03/03/2023 for PE.went to SNF> ED nausea 9/30  Significant studies: 9/7>> CTA chest: PE right middle lobe/subsegmental PE left upper lobe. 9/9>> bilateral lower extremity Doppler: No DVT 9/9>> echo: EF 60-65%, RV systolic function normal. 9/10>> left upper extremity Doppler: No DVT. Patient has been established with cardiology last appointment 2023       12/26/2022    9:33 AM 10/26/2022    1:32 PM 06/29/2022    1:53 PM 12/01/2021   10:07 AM 09/15/2021    9:37 AM  Depression screen PHQ 2/9  Decreased Interest 0 0 0 0 0  Down, Depressed,  Hopeless 0 0 0 1 0  PHQ - 2 Score 0 0 0 1 0  Altered sleeping    3   Tired, decreased energy    2   Change in appetite    0   Feeling bad or failure about yourself     0   Trouble concentrating    0   Moving slowly or fidgety/restless    0   Suicidal thoughts    0   PHQ-9 Score    6     No Known Allergies Social History   Social History Narrative   Marital status/children/pets: Widowed.   Education/employment: 12th grade education.  Retired.   Safety:      -smoke alarm in the home:Yes     - wears seatbelt: Yes     - Feels safe in their relationships: Yes   Past Medical History:  Diagnosis Date   Burning tongue syndrome 11/11/2015   Colon polyps    Cystocele with prolapse 2019   Dr. Marice [name]- Monitor for now.    Detached retina, right    Diabetes mellitus    Diaphragmatic hernia    GERD (gastroesophageal reflux disease)    Hyperlipidemia    Insomnia    Macular degeneration    Past Surgical History:  Procedure Laterality Date   ABDOMINAL HYSTERECTOMY     BREAST CYST ASPIRATION  11/03/2020   FOOT SURGERY     OOPHORECTOMY     Rt.ovary removed with Hyst   RETINAL  DETACHMENT SURGERY     TONSILLECTOMY     Family History  Problem Relation Age of Onset   Heart disease Other    Breast cancer Other    Breast cancer Mother        metastatic--to bone   CAD Father    Cancer Maternal Grandmother        unknown   Cancer Paternal Grandmother        colon   Allergies as of 04/04/2023   No Known Allergies      Medication List        Accurate as of April 04, 2023 11:59 PM. If you have any questions, ask your nurse or doctor.          STOP taking these medications    oxyCODONE 5 MG immediate release tablet Commonly known as: Roxicodone Stopped by: Felix Pacini       TAKE these medications    albuterol 108 (90 Base) MCG/ACT inhaler Commonly known as: VENTOLIN HFA Inhale 2 puffs into the lungs every 6 (six) hours as needed for wheezing or shortness of  breath.   apixaban 5 MG Tabs tablet Commonly known as: Eliquis Take 1 tablet (5 mg total) by mouth 2 (two) times daily. What changed:  how much to take how to take this when to take this additional instructions Changed by: Felix Pacini   atorvastatin 40 MG tablet Commonly known as: LIPITOR Take 1 tablet (40 mg total) by mouth daily.   escitalopram 5 MG tablet Commonly known as: Lexapro Take 1 tablet (5 mg total) by mouth daily.   fluocinonide cream 0.05 % Commonly known as: LIDEX Apply 1 Application topically 2 (two) times daily.   gabapentin 300 MG capsule Commonly known as: NEURONTIN Take 1 capsule (300 mg total) by mouth at bedtime.   metFORMIN 500 MG tablet Commonly known as: GLUCOPHAGE 500 mg (one tablet) in the morning and 1,000 mg (2 tablets) at night   metoprolol succinate 25 MG 24 hr tablet Commonly known as: TOPROL-XL Take 1 tablet (25 mg total) by mouth daily.   multivitamin tablet Take 1 tablet by mouth daily.   Oyster Shell Calcium/D 250-125 MG-UNIT Tabs Take by mouth.   pantoprazole 40 MG tablet Commonly known as: PROTONIX Take 1 tablet (40 mg total) by mouth daily.        All past medical history, surgical history, allergies, family history, immunizations andmedications were updated in the EMR today and reviewed under the history and medication portions of their EMR.     ROS Negative, with the exception of above mentioned in HPI   Objective:  BP 116/62   Pulse (!) 106   Temp 97.6 F (36.4 C)   Wt 119 lb 12.8 oz (54.3 kg)   SpO2 96%   BMI 21.22 kg/m  Body mass index is 21.22 kg/m. Physical Exam Vitals and nursing note reviewed.  Constitutional:      General: She is not in acute distress.    Appearance: Normal appearance. She is not ill-appearing, toxic-appearing or diaphoretic.  HENT:     Head: Normocephalic and atraumatic.  Eyes:     General: No scleral icterus.       Right eye: No discharge.        Left eye: No discharge.      Extraocular Movements: Extraocular movements intact.     Conjunctiva/sclera: Conjunctivae normal.     Pupils: Pupils are equal, round, and reactive to light.  Cardiovascular:  Rate and Rhythm: Normal rate and regular rhythm.  Pulmonary:     Effort: Pulmonary effort is normal. No respiratory distress.     Breath sounds: Normal breath sounds. No wheezing, rhonchi or rales.  Musculoskeletal:     Right lower leg: No edema.     Left lower leg: No edema.  Skin:    General: Skin is warm.     Findings: No rash.  Neurological:     Mental Status: She is alert and oriented to person, place, and time. Mental status is at baseline.     Motor: No weakness.     Gait: Gait normal.  Psychiatric:        Mood and Affect: Mood normal.        Behavior: Behavior normal.        Thought Content: Thought content normal.        Judgment: Judgment normal.    No results found. No results found. No results found for this or any previous visit (from the past 24 hour(s)).  Assessment/Plan: TRISTACA DADDIO is a 88 y.o. female present for OV for  acute pulmonary embolism without acute cor pulmonale (HCC) Refilled her Eliquis for her for 3 months, referred to heme-onc who will provide her with final recommendations on length of use. - Ambulatory referral to Hematology / Oncology - apixaban (ELIQUIS) 5 MG TABS tablet; Take 1 tablet (5 mg total) by mouth 2 (two) times daily.  Dispense: 60 tablet; Refill: 2  Closed displaced fracture of greater tuberosity of left humerus with routine healing, subsequent encounter Performing physical therapy.  Tolerating. Established with sports med  Tachycardia, unspecified Tachycardia could be multifactorial, they removed from metoprolol during hospitalization.  She has a pulmonary embolism and there may be some pain associated with her physical therapy. Restart metoprolol XL 25 mg daily  Reviewed expectations re: course of current medical issues. Discussed  self-management of symptoms. Outlined signs and symptoms indicating need for more acute intervention. Patient verbalized understanding and all questions were answered. Patient received an After-Visit Summary.    Orders Placed This Encounter  Procedures   Ambulatory referral to Hematology / Oncology   Meds ordered this encounter  Medications   metoprolol succinate (TOPROL-XL) 25 MG 24 hr tablet    Sig: Take 1 tablet (25 mg total) by mouth daily.    Dispense:  90 tablet    Refill:  1   apixaban (ELIQUIS) 5 MG TABS tablet    Sig: Take 1 tablet (5 mg total) by mouth 2 (two) times daily.    Dispense:  60 tablet    Refill:  2   Referral Orders         Ambulatory referral to Hematology / Oncology     60 minutes spent reviewing multiple admissions, labs, changes in medications, imaging results and counseling patient and her son  Note is dictated utilizing voice recognition software. Although note has been proof read prior to signing, occasional typographical errors still can be missed. If any questions arise, please do not hesitate to call for verification.   electronically signed by:  Felix Pacini, DO  [facility] [place]

## 2023-04-04 NOTE — Telephone Encounter (Signed)
Melissa [name] with Melissa [name] called to report that her heart rate is at rest 104-110 ongoing for a few weeks and with mild activity like walking it goes up to 116-123. Melissa [name] has an appointment today.  Melissa [name] can be reached at [phone]

## 2023-04-04 NOTE — Telephone Encounter (Signed)
Pt scheduled  

## 2023-04-05 ENCOUNTER — Telehealth: Payer: Self-pay | Admitting: Family Medicine

## 2023-04-05 MED ORDER — APIXABAN 5 MG PO TABS
5.0000 mg | ORAL_TABLET | Freq: Two times a day (BID) | ORAL | 2 refills | Status: DC
Start: 2023-04-05 — End: 2023-07-13

## 2023-04-05 NOTE — Telephone Encounter (Signed)
Patient reports y

## 2023-04-05 NOTE — Telephone Encounter (Signed)
Spoke with patient regarding results/recommendations.  

## 2023-04-05 NOTE — Telephone Encounter (Signed)
She is est with cardiology already and does not need a referral from [place] if she desires to see them again. Last seen 11/2021. Just needs to call that office.

## 2023-04-05 NOTE — Telephone Encounter (Signed)
Patient reports during her appointment yesterday she forgot about the Cardiologist she saw about three years ago. The Cardiologist is Dr. Nicki Guadalajara [phone]. This is where she would like to referred back to.

## 2023-04-06 NOTE — Progress Notes (Signed)
Left shoulder x-ray shows a healing fracture

## 2023-04-07 DIAGNOSIS — R000 Tachycardia, unspecified: Secondary | ICD-10-CM | POA: Insufficient documentation

## 2023-04-11 ENCOUNTER — Ambulatory Visit
Payer: No Typology Code available for payment source | Attending: Cardiovascular Disease | Admitting: Cardiovascular Disease

## 2023-04-11 ENCOUNTER — Encounter: Payer: Self-pay | Admitting: Cardiovascular Disease

## 2023-04-11 ENCOUNTER — Telehealth: Payer: Self-pay

## 2023-04-11 DIAGNOSIS — F419 Anxiety disorder, unspecified: Secondary | ICD-10-CM | POA: Diagnosis not present

## 2023-04-11 DIAGNOSIS — R251 Tremor, unspecified: Secondary | ICD-10-CM | POA: Diagnosis not present

## 2023-04-11 DIAGNOSIS — K219 Gastro-esophageal reflux disease without esophagitis: Secondary | ICD-10-CM | POA: Diagnosis not present

## 2023-04-11 DIAGNOSIS — I10 Essential (primary) hypertension: Secondary | ICD-10-CM

## 2023-04-11 DIAGNOSIS — R002 Palpitations: Secondary | ICD-10-CM

## 2023-04-11 DIAGNOSIS — E7800 Pure hypercholesterolemia, unspecified: Secondary | ICD-10-CM | POA: Diagnosis not present

## 2023-04-11 DIAGNOSIS — I4510 Unspecified right bundle-branch block: Secondary | ICD-10-CM | POA: Diagnosis not present

## 2023-04-11 DIAGNOSIS — I2699 Other pulmonary embolism without acute cor pulmonale: Secondary | ICD-10-CM

## 2023-04-11 DIAGNOSIS — G629 Polyneuropathy, unspecified: Secondary | ICD-10-CM

## 2023-04-11 DIAGNOSIS — D6859 Other primary thrombophilia: Secondary | ICD-10-CM | POA: Diagnosis not present

## 2023-04-11 DIAGNOSIS — S42412S Displaced simple supracondylar fracture without intercondylar fracture of left humerus, sequela: Secondary | ICD-10-CM

## 2023-04-11 MED ORDER — METOPROLOL SUCCINATE ER 25 MG PO TB24: 37.5000 mg | ORAL_TABLET | Freq: Every day | ORAL | 3 refills | Status: DC

## 2023-04-11 NOTE — Patient Instructions (Signed)
Medication Instructions:  Your physician has recommended you make the following change in your medication:   -Increase metoprolol succinate (Toprol- XL) 37.5mg  once daily.  *If you need a refill on your cardiac medications before your next appointment, please call your pharmacy*   Follow-Up: At [facility], you and your health needs are our priority.  As part of our continuing mission to provide you with exceptional heart care, we have created designated Provider Care Teams.  These Care Teams include your primary Cardiologist (physician) and Advanced Practice Providers (APPs -  Physician Assistants and Nurse Practitioners) who all work together to provide you with the care you need, when you need it.  We recommend signing up for the patient portal called "MyChart".  Sign up information is provided on this After Visit Summary.  MyChart is used to connect with patients for Virtual Visits (Telemedicine).  Patients are able to view lab/test results, encounter notes, upcoming appointments, etc.  Non-urgent messages can be sent to your provider as well.   To learn more about what you can do with MyChart, go to [email or web address].    Your next appointment:   3 month(s)  Provider:   Joni Reining, DNP, ANP

## 2023-04-11 NOTE — Telephone Encounter (Signed)
Received DM Eye Exam. Please advise of results/ Iris results placed in PCP office

## 2023-04-11 NOTE — Progress Notes (Signed)
Cardiology Office Note    Date:  04/16/2023   ID:  Melissa [name], DOB 09/22/1934, MRN [id number]  PCP:  Natalia Leatherwood, DO  Cardiologist:  Nicki Guadalajara, MD   History of Present Illness:  Melissa [name] is a 88 y.o. female who presents for a F/U cardiology evaluation initially referred through the courtesy of Dr. Betty Swaziland for evaluation of chest pain and palpitations. I last saw he on January 03, 2018.                             Melissa [name] admits to 3-4 month history of a "stinging "like chest pain.  This typically is nonexertional, but seems to occur whenever she feels that she has a lot to do and becomes anxious and overloaded.  She was seen by Dr. Betty Swaziland on 09/05/2017 and also complained  of retrosternal burning.  She also has noticed at times her pulse is high.  She has a history of hyperlipidemia but has not been regularly taking her statin. She also has a history of possible GERD.  She also has noticed some possible TMJ discomfort.  She denied PND, orthopnea.  She denies presyncope or syncope.   I saw her for initial evaluation on September 20, 2017 and last saw her on January 03, 2018.  At her initial evaluation I recommended she undergo an echo Doppler assessment.  I initiated Toprol-XL 25 mg in light of her somewhat atypical chest pain and increased heart rate.  Her echo Doppler study on October 03, 2017 showed an EF of 65 to 70%.  There was severe mitral annular calcification with very mild functional Melissa by valve area.  She has a history of significant lipid abnormality with prior laboratory showing a total cholesterol of 285 and initial LDL cholesterol 173.  She has now been on atorvastatin 40 mg.  Has tolerated the increased regimen and repeat laboratory June 2019 showed a total cholesterol at 176 and LDL cholesterol at 87.  She tells me that her primary physician recently reduced her Toprol dose back to 12.5 mg last week.  On the reduced dose she has noticed her heart rate increasing with  her pulse in the 90-100 range.    Since I last saw her, she has been evaluated by Joni Reining, NP.  She was last seen by her on November 26, 2021.  At time she had experienced some nonexertional chest pain and during that time also was complaining of right shoulder pain related to rotator cuff tear.  There were no plans for ischemic evaluation.  She was on metoprolol succinate 25 mg daily for palpitations.  She was on atorvastatin 40 mg for hyperlipidemia.  Recently, she she had sustained a closed displaced fracture of the proximal end of the left humerus following a mechanical fall and was evaluated at [facility] on February 18, 2023.  She was hospitalized on February 25, 2023 with increasing shortness of breath and was found to have PE in the right middle lobe and subsegmental PE in the left upper lobe.  Bilateral lower extremity Doppler studies did not reveal any DVT.  An echo Doppler study showed an EF of 60 to 65% with normal RV systolic function.  Left upper extremity Doppler did not show any DVT.  She was treated with Eliquis and was discharged on 10 mg twice a day until March 07, 2023 with dose reduction to 5 mg twice  a day thereafter.  It was advised that she will need hematologic evaluation as an outpatient before contemplating future potential stopping of anticoagulation.  She apparently was discharged to [facility] and rehab and apparently presented to [facility] and was admitted for hypertension on March 20, 2023.  She was given aggressive IV fluid hydration in the ED and also was treated with Zofran and Reglan.  A CT was negative for acute findings but showed a large hiatal hernia.  She was started on clear liquids and admitted for observation.  She is followed by Dr. Claiborne Billings.  She now presents to the office for cardiology evaluation.  Presently, Melissa [name] feels well.  She lives in [place].  She denies any significant recent shortness of breath.  She does have hand tremors.   She has continued to be on Eliquis.  She denies any chest pain.  She is on metoprolol succinate 25 mg daily for hypertension.  She continues to be on atorvastatin 40 mg for hyperlipidemia.  She takes Neurontin for neuropathy and takes Lexapro 5 mg daily.  She is on pantoprazole for GERD.  She presents for evaluation.   Past Medical History:  Diagnosis Date   Burning tongue syndrome 11/11/2015   Colon polyps    Cystocele with prolapse 2019   Dr. Marice Potter- Monitor for now.    Detached retina, right    Diabetes mellitus    Diaphragmatic hernia    GERD (gastroesophageal reflux disease)    Hyperlipidemia    Insomnia    Macular degeneration     Past Surgical History:  Procedure Laterality Date   ABDOMINAL HYSTERECTOMY     BREAST CYST ASPIRATION  11/03/2020   FOOT SURGERY     OOPHORECTOMY     Rt.ovary removed with Hyst   RETINAL DETACHMENT SURGERY     TONSILLECTOMY      Current Medications: Outpatient Medications Prior to Visit  Medication Sig Dispense Refill   albuterol (VENTOLIN HFA) 108 (90 Base) MCG/ACT inhaler Inhale 2 puffs into the lungs every 6 (six) hours as needed for wheezing or shortness of breath. 8 g 0   apixaban (ELIQUIS) 5 MG TABS tablet Take 1 tablet (5 mg total) by mouth 2 (two) times daily. 60 tablet 2   atorvastatin (LIPITOR) 40 MG tablet Take 1 tablet (40 mg total) by mouth daily. 90 tablet 1   Calcium Carbonate-Vitamin D (OYSTER SHELL CALCIUM/D) 250-125 MG-UNIT TABS Take by mouth.     escitalopram (LEXAPRO) 5 MG tablet Take 1 tablet (5 mg total) by mouth daily. 90 tablet 1   fluocinonide cream (LIDEX) 0.05 % Apply 1 Application topically 2 (two) times daily. 30 g 2   gabapentin (NEURONTIN) 300 MG capsule Take 1 capsule (300 mg total) by mouth at bedtime. 90 capsule 1   metFORMIN (GLUCOPHAGE) 500 MG tablet 500 mg (one tablet) in the morning and 1,000 mg (2 tablets) at night 270 tablet 1   Multiple Vitamin (MULTIVITAMIN) tablet Take 1 tablet by mouth daily.      pantoprazole (PROTONIX) 40 MG tablet Take 1 tablet (40 mg total) by mouth daily. 90 tablet 3   metoprolol succinate (TOPROL-XL) 25 MG 24 hr tablet Take 1 tablet (25 mg total) by mouth daily. 90 tablet 1   No facility-administered medications prior to visit.     Allergies:   Patient has no known allergies.   Social History   Socioeconomic History   Marital status: Widowed    Spouse name: Not on file  Number of children: Not on file   Years of education: Not on file   Highest education level: Not on file  Occupational History   Not on file  Tobacco Use   Smoking status: Never   Smokeless tobacco: Never  Vaping Use   Vaping status: Never Used  Substance and Sexual Activity   Alcohol use: No   Drug use: No   Sexual activity: Not Currently    Birth control/protection: Surgical    Comment: Hyst--Lt.ovary remains  Other Topics Concern   Not on file  Social History Narrative   Marital status/children/pets: Widowed.   Education/employment: 12th grade education.  Retired.   Safety:      -smoke alarm in the home:Yes     - wears seatbelt: Yes     - Feels safe in their relationships: Yes   Social Determinants of Health   Financial Resource Strain: Low Risk  (03/20/2023)   Received from [facility]   Overall Financial Resource Strain (CARDIA)    Difficulty of Paying Living Expenses: Not hard at all  Food Insecurity: No Food Insecurity (03/20/2023)   Received from [facility]   Hunger Vital Sign    Worried About Running Out of Food in the Last Year: Never true    Ran Out of Food in the Last Year: Never true  Transportation Needs: No Transportation Needs (03/20/2023)   Received from [facility] - Transportation    Lack of Transportation (Medical): No    Lack of Transportation (Non-Medical): No  Physical Activity: Insufficiently Active (03/20/2023)   Received from [facility]   Exercise Vital Sign    Days of Exercise per Week: 2 days    Minutes of  Exercise per Session: 10 min  Stress: No Stress Concern Present (03/20/2023)   Received from [facility] - Occupational Stress Questionnaire    Feeling of Stress : Not at all  Social Connections: Moderately Integrated (03/20/2023)   Received from [facility]   Social Connection and Isolation Panel [NHANES]    Frequency of Communication with Friends and Family: Not on file    Frequency of Social Gatherings with Friends and Family: Three times a week    Attends Religious Services: More than 4 times per year    Active Member of Clubs or Organizations: Yes    Attends Banker Meetings: More than 4 times per year    Marital Status: Widowed    Socially she is widowed since February 2019.  She previously worked in [employer] in the [employer]  system.  She completed 12th grade of education.  There is no tobacco or alcohol use.  She does not exercise.  Family History:  The patient's family history includes Breast cancer in her mother and another family member; CAD in her father; Cancer in her maternal grandmother and paternal grandmother; Heart disease in an other family member.  Her mother died at 60 with cancer.  Her father died at age 91 with heart problems.  She has a living brother age 25.  2 sisters are deceased.  ROS General: Negative; No fevers, chills, or night sweats;  HEENT: blind in her right eye.  TMJ discomfort. no sinus congestion, difficulty swallowing Pulmonary: PE September 2024, 1 week after upper left humeral fracture following mechanical fall Cardiovascular: See HPI GI: positive for GERD. GU: Negative; No dysuria, hematuria, or difficulty voiding Musculoskeletal: Negative; no  myalgias, joint pain, or weakness Hematologic/Oncology: Negative; no easy bruising, bleeding Endocrine: Diabetes on metformin Neuro: Negative; no changes in balance, headaches Skin: Negative; No rashes or skin  lesions Psychiatric: Negative; No behavioral problems, depression Sleep: Negative; No snoring, daytime sleepiness, hypersomnolence, bruxism, restless legs, hypnogognic hallucinations, no cataplexy Other comprehensive 14 point system review is negative.   PHYSICAL EXAM:   VS:  BP 128/72   Pulse 87   Ht 5\' 3"  (1.6 m)   Wt 120 lb (54.4 kg)   SpO2 97%   BMI 21.26 kg/m     Repeat BP blood pressure by me was 124/74  Wt Readings from Last 3 Encounters:  04/11/23 120 lb (54.4 kg)  04/04/23 119 lb 12.8 oz (54.3 kg)  03/02/23 120 lb 9.5 oz (54.7 kg)    General: Alert, oriented, no distress.  Skin: normal turgor, no rashes, warm and dry HEENT: Normocephalic, atraumatic. Pupils equal round and reactive to light; sclera anicteric; extraocular muscles intact;  Nose without nasal septal hypertrophy Mouth/Parynx benign; Mallinpatti scale 3 Neck: No JVD, no carotid bruits; normal carotid upstroke Lungs: clear to ausculatation and percussion; no wheezing or rales Chest wall: without tenderness to palpitation Heart: PMI not displaced, RRR, s1 s2 normal, 1/6 systolic murmur, no diastolic murmur, no rubs, gallops, thrills, or heaves Abdomen: soft, nontender; no hepatosplenomehaly, BS+; abdominal aorta nontender and not dilated by palpation. Back: no CVA tenderness Pulses 2+ Musculoskeletal: full range of motion, normal strength, no joint deformities Extremities: no clubbing cyanosis or edema, Homan's sign negative  Neurologic: grossly nonfocal; Cranial nerves grossly wnl Psychologic: Normal mood and affect    Studies/Labs Reviewed:   EKG Interpretation Date/Time:  Tuesday April 11 2023 10:17:20 EDT Ventricular Rate:  86 PR Interval:  144 QRS Duration:  118 QT Interval:  418 QTC Calculation: 500 R Axis:   114  Text Interpretation: Normal sinus rhythm Low voltage QRS Right bundle branch block Cannot rule out Anterior infarct , age undetermined When compared with ECG of 25-Feb-2023  16:45, Minimal criteria for Anterior infarct are now Present Confirmed by Nicki Guadalajara ([id number]) on 04/11/2023 10:18:37 AM    December 30, 2027 ECG (independently read by me): Sinus rhythm at 96 bpm.  No ectopy.  Normal intervals.  September 20, 2017 ECG (independently read by me): Normal Sinus rhythm at 96 bpm.  Possible left atrial enlargement.  Normal intervals.  Recent Labs:    Latest Ref Rng & Units 03/01/2023    6:36 AM 02/28/2023    3:04 AM 02/26/2023    1:38 PM  BMP  Glucose 70 - 99 mg/dL 237  628  315   BUN 8 - 23 mg/dL 14  13  17    Creatinine 0.44 - 1.00 mg/dL 1.76  1.60  7.37   Sodium 135 - 145 mmol/L 136  138  138   Potassium 3.5 - 5.1 mmol/L 4.0  3.8  4.0   Chloride 98 - 111 mmol/L 100  103  101   CO2 22 - 32 mmol/L 26  26  23    Calcium 8.9 - 10.3 mg/dL 9.4  9.5  8.9         Latest Ref Rng & Units 02/25/2023    5:23 PM 11/09/2022   10:17 AM 06/29/2022    4:30 PM  Hepatic Function  Total Protein 6.5 - 8.1 g/dL 6.0  6.9  7.8   Albumin 3.5 - 5.0 g/dL 3.5  4.2  4.0   AST 15 - 41 U/L 13  14  17   ALT 0 - 44 U/L 10  9  12    Alk Phosphatase 38 - 126 U/L 63  67  76   Total Bilirubin 0.3 - 1.2 mg/dL 0.5  0.5  0.6        Latest Ref Rng & Units 02/28/2023    3:04 AM 02/27/2023    4:12 AM 02/26/2023    1:38 PM  CBC  WBC 4.0 - 10.5 K/uL 7.1  7.3  6.8   Hemoglobin 12.0 - 15.0 g/dL 25.9  56.3  87.5   Hematocrit 36.0 - 46.0 % 31.4  32.7  33.9   Platelets 150 - 400 K/uL 329  314  321    Lab Results  Component Value Date   MCV 96.3 02/28/2023   MCV 96.2 02/27/2023   MCV 95.8 02/26/2023   Lab Results  Component Value Date   TSH 1.31 12/01/2021   Lab Results  Component Value Date   HGBA1C 7.0 (H) 02/26/2023     BNP    Component Value Date/Time   BNP 210.6 (H) 02/25/2023 2019    ProBNP No results found for: "PROBNP"   Lipid Panel     Component Value Date/Time   CHOL 185 11/09/2022 1017   CHOL 176 12/08/2017 1038   TRIG 108.0 11/09/2022 1017   HDL 74.40 11/09/2022  1017   HDL 67 12/08/2017 1038   CHOLHDL 2 11/09/2022 1017   VLDL 21.6 11/09/2022 1017   LDLCALC 89 11/09/2022 1017   LDLCALC 87 12/08/2017 1038   LDLDIRECT 112.6 01/17/2012 1051     RADIOLOGY: No results found.   Additional studies/ records that were reviewed today include:  I have reviewed the multiple records since my last evaluation in 2019.  Most recent hospitalization and ER evaluations were reviewed.  ECHO: 02/27/2023  1. Cannot assess wall motion. Windows are limited. Left ventricular  ejection fraction, by estimation, is 60 to 65%. The left ventricle has  normal function. The left ventricle has no regional wall motion  abnormalities. There is mild left ventricular  hypertrophy. Left ventricular diastolic function could not be evaluated.   2. Right ventricular systolic function is normal. The right ventricular  size is not well visualized. There is normal pulmonary artery systolic  pressure. The estimated right ventricular systolic pressure is 27.6 mmHg.   3. A small pericardial effusion is present.   4. The mitral valve was not well visualized. No evidence of mitral valve  regurgitation.   5. Aortic valve regurgitation is not visualized.   6. The inferior vena cava is normal in size with greater than 50%  respiratory variability, suggesting right atrial pressure of 3 mmHg.    LE Venous: 02/27/2023 Summary:  BILATERAL:  - No evidence of deep vein thrombosis seen in the lower extremities,  bilaterally.  -No evidence of popliteal cyst, bilaterally.   UE Venous: 02/28/2023 Summary:  Right:  No evidence of thrombosis in the subclavian.    Left:  No evidence of deep vein thrombosis in the upper extremity. No evidence of  superficial vein thrombosis in the upper extremity.         ASSESSMENT:    1. Bilateral pulmonary embolism (HCC)   2. Palpitations   3. Right bundle branch block   4. Hypercoagulable state (HCC)   5. Hypercholesterolemia   6. Gastroesophageal  reflux disease without esophagitis   7. Tremor of both hands   8. Neuropathy   9. Anxiety   10. Closed supracondylar  fracture of left humerus, sequela      PLAN:  Melissa [name] is an 88 year old female who was initially referred to me in 2019 after experiencing a 3 to 80-month history of stinging nonexertional atypical chest pain.  This seemed to be occurring during periods of increased anxiety and she had been recently widowed.  When I last saw her, I recommended initiation of Toprol-XL 25 mg.  She felt well with this, but her dose was recently reduced to 12.5 mg last week.  When I last saw her in July 2019 she had noted her heart rate increasing with beta-blocker reduction. An echo Doppler study showed moderate left ventricular hypertrophy with vigorous LV function and severe mitral annular calcification leading to mild functional mitral stenosis by valve area.  She has hyperlipidemia.  Her lipid studies significantly improved on her increased dose of atorvastatin now at 40 mg daily with LDL being reduced to 87 from initially 173.  Her blood pressure today is stable on her current regimen.  I have suggested she try resuming the Toprol and instead of 25 mg in the morning she will take 12.5 mg twice a day to see if she tolerates this better.  She was  diabetic on metformin and GERD was controlled on Protonix.  I had not seen over the past 5 years.  I extensively reviewed her recent hospitalizations following her left upper humerus fracture resulting from a mechanical fall with subsequent demonstration of bilateral pulmonary emboli.  Subsequent to her hospitalization, she also was rehospitalized for observation with low blood pressure.  Presently, she feels well.  Her blood pressure today by me was stable at 124/74.  Her ECG shows sinus rhythm with right bundle blanch block.  She admits to hand tremors which seem to be exacerbated when she is fatigued and tired.  She continues to be on Eliquis following  her PE and continues to be on 5 mg twice a day.  With her increased heart rate and tremors, I am suggesting slight titration of metoprolol succinate to 37.5 mg daily, present dose of 25 mg.  She continues to be on atorvastatin 40 mg and LDL in May 2024 was 89.  He was recommended that she establish care with hematology and she has an appointment to be seen in early November with Dr. Arlana Pouch regarding her duration and chronicity of ongoing anticoagulation.  She will continue to be followed by Dr. Claiborne Billings for primary care.  Since she had seen Metta Clines, NP in June 2023, I have suggested a follow-up Cardiologic evaluation in 3 months with Joni Reining, NP.   Medication Adjustments/Labs and Tests Ordered: Current medicines are reviewed at length with the patient today.  Concerns regarding medicines are outlined above.  Medication changes, Labs and Tests ordered today are listed in the Patient Instructions below. Patient Instructions  Medication Instructions:  Your physician has recommended you make the following change in your medication:   -Increase metoprolol succinate (Toprol- XL) 37.5mg  once daily.  *If you need a refill on your cardiac medications before your next appointment, please call your pharmacy*   Follow-Up: At [facility], you and your health needs are our priority.  As part of our continuing mission to provide you with exceptional heart care, we have created designated Provider Care Teams.  These Care Teams include your primary Cardiologist (physician) and Advanced Practice Providers (APPs -  Physician Assistants and Nurse Practitioners) who all work together to provide you with the care you need, when  you need it.  We recommend signing up for the patient portal called "MyChart".  Sign up information is provided on this After Visit Summary.  MyChart is used to connect with patients for Virtual Visits (Telemedicine).  Patients are able to view lab/test results,  encounter notes, upcoming appointments, etc.  Non-urgent messages can be sent to your provider as well.   To learn more about what you can do with MyChart, go to [email or web address].    Your next appointment:   3 month(s)  Provider:   Joni Reining, DNP, ANP       Signed, Nicki Guadalajara, MD  04/16/2023 11:52 AM    [facility] [address], [address], [place], [place]  [address] Phone: [phone]

## 2023-04-12 NOTE — Telephone Encounter (Signed)
Letter sent.

## 2023-04-12 NOTE — Telephone Encounter (Signed)
Please inform patient her eye exam was negative for diabetic retinopathy and negative for macular edema.  It did show evidence of her macular degeneration in her right eye-which she was aware of..   Recommend she continue to follow with her ophthalmologist concerning condition.

## 2023-04-12 NOTE — Telephone Encounter (Signed)
LM for pt to return call to discuss.  

## 2023-04-13 NOTE — Telephone Encounter (Signed)
Patient is aware of eye exam results. She had no additional questions or concerns at this time.

## 2023-04-16 ENCOUNTER — Encounter: Payer: Self-pay | Admitting: Cardiovascular Disease

## 2023-04-19 ENCOUNTER — Ambulatory Visit: Payer: No Typology Code available for payment source | Admitting: Family Medicine

## 2023-04-20 DIAGNOSIS — S42292D Other displaced fracture of upper end of left humerus, subsequent encounter for fracture with routine healing: Secondary | ICD-10-CM | POA: Diagnosis not present

## 2023-04-20 DIAGNOSIS — W19XXXD Unspecified fall, subsequent encounter: Secondary | ICD-10-CM | POA: Diagnosis not present

## 2023-04-20 DIAGNOSIS — E785 Hyperlipidemia, unspecified: Secondary | ICD-10-CM | POA: Diagnosis not present

## 2023-04-20 DIAGNOSIS — E119 Type 2 diabetes mellitus without complications: Secondary | ICD-10-CM | POA: Diagnosis not present

## 2023-04-20 DIAGNOSIS — Z7984 Long term (current) use of oral hypoglycemic drugs: Secondary | ICD-10-CM | POA: Diagnosis not present

## 2023-04-20 DIAGNOSIS — Z79891 Long term (current) use of opiate analgesic: Secondary | ICD-10-CM | POA: Diagnosis not present

## 2023-04-20 DIAGNOSIS — M79602 Pain in left arm: Secondary | ICD-10-CM | POA: Diagnosis not present

## 2023-04-24 ENCOUNTER — Inpatient Hospital Stay: Payer: No Typology Code available for payment source | Admitting: Oncology

## 2023-04-26 ENCOUNTER — Ambulatory Visit (INDEPENDENT_AMBULATORY_CARE_PROVIDER_SITE_OTHER): Payer: No Typology Code available for payment source | Admitting: Family Medicine

## 2023-04-26 ENCOUNTER — Encounter: Payer: Self-pay | Admitting: Family Medicine

## 2023-04-26 ENCOUNTER — Ambulatory Visit: Payer: No Typology Code available for payment source | Admitting: Family Medicine

## 2023-04-26 VITALS — BP 118/78 | HR 57 | Temp 97.9°F | Wt 119.6 lb

## 2023-04-26 DIAGNOSIS — G8929 Other chronic pain: Secondary | ICD-10-CM

## 2023-04-26 DIAGNOSIS — E1169 Type 2 diabetes mellitus with other specified complication: Secondary | ICD-10-CM | POA: Diagnosis not present

## 2023-04-26 DIAGNOSIS — Z7984 Long term (current) use of oral hypoglycemic drugs: Secondary | ICD-10-CM | POA: Diagnosis not present

## 2023-04-26 DIAGNOSIS — E785 Hyperlipidemia, unspecified: Secondary | ICD-10-CM | POA: Diagnosis not present

## 2023-04-26 DIAGNOSIS — Z23 Encounter for immunization: Secondary | ICD-10-CM

## 2023-04-26 DIAGNOSIS — M5442 Lumbago with sciatica, left side: Secondary | ICD-10-CM | POA: Diagnosis not present

## 2023-04-26 DIAGNOSIS — I10 Essential (primary) hypertension: Secondary | ICD-10-CM | POA: Diagnosis not present

## 2023-04-26 DIAGNOSIS — I2699 Other pulmonary embolism without acute cor pulmonale: Secondary | ICD-10-CM

## 2023-04-26 DIAGNOSIS — M25552 Pain in left hip: Secondary | ICD-10-CM

## 2023-04-26 DIAGNOSIS — F419 Anxiety disorder, unspecified: Secondary | ICD-10-CM | POA: Diagnosis not present

## 2023-04-26 DIAGNOSIS — E119 Type 2 diabetes mellitus without complications: Secondary | ICD-10-CM

## 2023-04-26 LAB — POCT GLYCOSYLATED HEMOGLOBIN (HGB A1C)
HbA1c POC (<> result, manual entry): 6.3 % (ref 4.0–5.6)
HbA1c, POC (controlled diabetic range): 6.3 % (ref 0.0–7.0)
HbA1c, POC (prediabetic range): 6.3 % (ref 5.7–6.4)
Hemoglobin A1C: 6.3 % — AB (ref 4.0–5.6)

## 2023-04-26 MED ORDER — GABAPENTIN 300 MG PO CAPS: 300.0000 mg | ORAL_CAPSULE | Freq: Every day | ORAL | 1 refills | Status: DC

## 2023-04-26 MED ORDER — ESCITALOPRAM OXALATE 5 MG PO TABS: 5.0000 mg | ORAL_TABLET | Freq: Every day | ORAL | 1 refills | Status: DC

## 2023-04-26 MED ORDER — METFORMIN HCL 500 MG PO TABS: ORAL_TABLET | ORAL | 1 refills | Status: DC

## 2023-04-26 MED ORDER — ATORVASTATIN CALCIUM 40 MG PO TABS
40.0000 mg | ORAL_TABLET | Freq: Every day | ORAL | 1 refills | Status: DC
Start: 2023-04-26 — End: 2023-12-08

## 2023-04-26 NOTE — Progress Notes (Signed)
Patient Care Team    Relationship Specialty Notifications Start End  Natalia Leatherwood, DO PCP - General Family Medicine  07/03/18   Rankin, Alford Highland, MD Consulting Physician Ophthalmology  07/03/18   Allie Bossier, MD Consulting Physician Obstetrics and Gynecology  07/04/18   Lennette Bihari, MD Consulting Physician Cardiology  07/04/18   Patton Salles, MD Consulting Physician Obstetrics and Gynecology  02/18/20   Ponciano Ort, [facility]    12/01/21     SUBJECTIVE Chief Complaint  Patient presents with   Diabetes    Pt is fasting    HPI: Melissa [name] is a 88 y.o. present for f/u [facility] Diabetes/macular degeneration/nonproliferative retinopathy:  Pt reports compliance with metformin 500 mg in the morning and 1000 mg in the afternoon. Patient denies dizziness, hyperglycemic or hypoglycemic events. Patient denies numbness, tingling in the extremities or nonhealing wounds of feet.  She is prescribed gabapentin 300 mg nightly for back pain.    Palpitations/hyperlipidemia/peripheral artery disease/anxiety: She reports compliance with her metoprolol 25 mg daily, lexapro 5 mg qd. She is prescribed Lipitor 40 mg daily and takes a daily baby aspirin.   Echo Doppler study on October 03, 2017 showed an EF of 65 to 70%.  There was severe mitral annular calcification with very mild functional MS by valve area.  Patient denies chest pain, shortness of breath, dizziness or lower extremity edema.    GERD: Patient reports Her GERD is controlled.  She is compliant with Protonix 40 mg daily.  She has been on this for quite some time and takes it daily. Without medication her symptoms return.   Chronic back pain  Patient reports compliance with gabapentin has been extremely helpful. She only occ.will use the Zanaflex and but make sure not to take at the same time as her gabapentin.  She deniesany oversedation. She has been out of her gabapentin for the last week and noticed a mild  tremor in her arm is worse.   ROS: See pertinent positives and negatives per HPI.  Patient Active Problem List   Diagnosis Date Noted   Tachycardia, unspecified 04/07/2023   Humerus fracture 02/26/2023   Essential hypertension 02/26/2023   Normocytic anemia 02/26/2023   Acute pulmonary embolism (HCC) 02/25/2023   Lumbar degenerative disc disease 01/27/2023   Retrolisthesis of vertebrae-L2 on L3 approximately 7-8 mm 01/27/2023   Diverticulitis 01/26/2022   Callus 10/21/2020   Early stage nonexudative age-related macular degeneration of left eye 04/13/2020   Advanced nonexudative age-related macular degeneration of right eye with subfoveal involvement 04/01/2020   Degenerative retinal drusen of left eye 04/01/2020   Pseudophakia, both eyes 04/01/2020   Posterior vitreous detachment of left eye 04/01/2020   Anxiety 03/05/2019   Chronic bilateral thoracic back pain 08/03/2018   Iron deficiency anemia 07/04/2018   Palpitations 07/04/2018   Peripheral arterial disease (HCC) 07/11/2017   Varicose veins of both lower extremities 08/31/2016   Macular degeneration 05/05/2015   Hiatal hernia 01/28/2015   Mild nonproliferative retinopathy of both eyes with macular edema associated with type 2 diabetes mellitus (HCC) 10/02/2013   Type 2 diabetes mellitus (HCC) 03/24/2009   GERD 03/24/2009    Social History   Tobacco Use   Smoking status: Never   Smokeless tobacco: Never  Substance Use Topics   Alcohol use: No    Current Outpatient Medications:    albuterol (VENTOLIN HFA) 108 (90 Base) MCG/ACT inhaler, Inhale 2 puffs into the lungs every  6 (six) hours as needed for wheezing or shortness of breath., Disp: 8 g, Rfl: 0   apixaban (ELIQUIS) 5 MG TABS tablet, Take 1 tablet (5 mg total) by mouth 2 (two) times daily., Disp: 60 tablet, Rfl: 2   Calcium Carbonate-Vitamin D (OYSTER SHELL CALCIUM/D) 250-125 MG-UNIT TABS, Take by mouth., Disp: , Rfl:    fluocinonide cream (LIDEX) 0.05 %, Apply 1  Application topically 2 (two) times daily., Disp: 30 g, Rfl: 2   metoprolol succinate (TOPROL-XL) 25 MG 24 hr tablet, Take 1.5 tablets (37.5 mg total) by mouth daily., Disp: 135 tablet, Rfl: 3   Multiple Vitamin (MULTIVITAMIN) tablet, Take 1 tablet by mouth daily., Disp: , Rfl:    pantoprazole (PROTONIX) 40 MG tablet, Take 1 tablet (40 mg total) by mouth daily., Disp: 90 tablet, Rfl: 3   atorvastatin (LIPITOR) 40 MG tablet, Take 1 tablet (40 mg total) by mouth daily., Disp: 90 tablet, Rfl: 1   escitalopram (LEXAPRO) 5 MG tablet, Take 1 tablet (5 mg total) by mouth daily., Disp: 90 tablet, Rfl: 1   gabapentin (NEURONTIN) 300 MG capsule, Take 1 capsule (300 mg total) by mouth at bedtime., Disp: 90 capsule, Rfl: 1   metFORMIN (GLUCOPHAGE) 500 MG tablet, 500 mg (one tablet) in the morning and 1,000 mg (2 tablets) at night, Disp: 270 tablet, Rfl: 1  No Known Allergies  OBJECTIVE: BP 118/78   Pulse (!) 57   Temp 97.9 F (36.6 C)   Wt 119 lb 9.6 oz (54.3 kg)   SpO2 97%   BMI 21.19 kg/m  Physical Exam Vitals and nursing note reviewed.  Constitutional:      General: She is not in acute distress.    Appearance: Normal appearance. She is not ill-appearing, toxic-appearing or diaphoretic.  HENT:     Head: Normocephalic and atraumatic.     Mouth/Throat:     Mouth: Mucous membranes are moist.  Eyes:     General: No scleral icterus.       Right eye: No discharge.        Left eye: No discharge.     Extraocular Movements: Extraocular movements intact.     Conjunctiva/sclera: Conjunctivae normal.     Pupils: Pupils are equal, round, and reactive to light.  Cardiovascular:     Rate and Rhythm: Normal rate and regular rhythm.  Pulmonary:     Effort: Pulmonary effort is normal. No respiratory distress.     Breath sounds: Normal breath sounds. No wheezing, rhonchi or rales.  Musculoskeletal:     Cervical back: Neck supple. No tenderness.     Right lower leg: No edema.     Left lower leg: No  edema.  Lymphadenopathy:     Cervical: No cervical adenopathy.  Skin:    General: Skin is warm and dry.     Coloration: Skin is not jaundiced or pale.     Findings: No erythema or rash.  Neurological:     Mental Status: She is alert and oriented to person, place, and time. Mental status is at baseline.     Motor: No weakness.     Gait: Gait normal.     Comments: Very mild essential tremor right hand.  Psychiatric:        Mood and Affect: Mood normal.        Behavior: Behavior normal.        Thought Content: Thought content normal.        Judgment: Judgment normal.    Diabetic  Foot Exam - Simple   Simple Foot Form Diabetic Foot exam was performed with the following findings: Yes 04/26/2023 10:28 AM  Visual Inspection No deformities, no ulcerations, no other skin breakdown bilaterally: Yes Sensation Testing Intact to touch and monofilament testing bilaterally: Yes Pulse Check Posterior Tibialis and Dorsalis pulse intact bilaterally: Yes Comments     Results for orders placed or performed in visit on 04/26/23 (from the past 24 hour(s))  POCT HgB A1C     Status: Abnormal   Collection Time: 04/26/23 10:35 AM  Result Value Ref Range   Hemoglobin A1C 6.3 (A) 4.0 - 5.6 %   HbA1c POC (<> result, manual entry) 6.3 4.0 - 5.6 %   HbA1c, POC (prediabetic range) 6.3 5.7 - 6.4 %   HbA1c, POC (controlled diabetic range) 6.3 0.0 - 7.0 %      ASSESSMENT AND PLAN: Melissa [name] is a 88 y.o. female present for  Controlled type 2 diabetes mellitus (HCC)/Type 2 DM mild nonproliferative retinopathy, macular edema, uncontrol (HCC) Stable Continue metformin 1500 mg total a day- may divide. PNA series: Completed 2019 Flu shot: Patient is having completed today at the pharmacy Urine microal: UTD 11/09/2022 Foot exam: completed 04/26/2023 Eye exam: Dr. Dione Booze, routine eye exams with macular degeneration.11/08/2022, Dr. Luciana Axe A1c: 6.5>>7.3 >>6.3>>6.4> 6.8> 6.5 > 6.4 >6.8 > 6.4>7.0>6.3 A1c   collected today   Hyperlipidemia associated (HCC)/Peripheral arterial disease (HCC)/Palpitations/IDA/CKD 3 - Continue to  avoid caffeine, stimulants-her vice is excess chocolate. -Continue metoprolol 37.5 mg QD> prescribed by cardio -Continue atorvastatin 40 mg daily> prescribed by cardio - Continue baby aspirin daily. - avoid NSAIDS; renal dose.   Anxiety Stable Continue lexapro 5 mg QD-tolerating She discontinued  Trazodone 25-50 mg nightly (felt made her shaky)  Gastroesophageal reflux disease without esophagitis Stable Continue Protonix -Encouraged her to try Biotene 2 times a day.    Fecal smearing/hemorrhoids: Evaluated by Dr. Kinnie Scales  Chronic thoracic back pain and new left jaw discomfort: Stable Continue gabapentin nightly  Continue  Zanaflex prn  Return in about 24 weeks (around 10/11/2023) for Routine chronic condition follow-up.    Orders Placed This Encounter  Procedures   POCT HgB A1C    Meds ordered this encounter  Medications   atorvastatin (LIPITOR) 40 MG tablet    Sig: Take 1 tablet (40 mg total) by mouth daily.    Dispense:  90 tablet    Refill:  1   escitalopram (LEXAPRO) 5 MG tablet    Sig: Take 1 tablet (5 mg total) by mouth daily.    Dispense:  90 tablet    Refill:  1   metFORMIN (GLUCOPHAGE) 500 MG tablet    Sig: 500 mg (one tablet) in the morning and 1,000 mg (2 tablets) at night    Dispense:  270 tablet    Refill:  1   gabapentin (NEURONTIN) 300 MG capsule    Sig: Take 1 capsule (300 mg total) by mouth at bedtime.    Dispense:  90 capsule    Refill:  1      Akayla Brass, DO 04/26/2023

## 2023-04-26 NOTE — Patient Instructions (Addendum)

## 2023-05-03 DIAGNOSIS — S42295D Other nondisplaced fracture of upper end of left humerus, subsequent encounter for fracture with routine healing: Secondary | ICD-10-CM | POA: Diagnosis not present

## 2023-05-08 ENCOUNTER — Telehealth: Payer: Self-pay

## 2023-05-08 ENCOUNTER — Other Ambulatory Visit: Payer: Self-pay | Admitting: *Deleted

## 2023-05-08 ENCOUNTER — Inpatient Hospital Stay: Payer: No Typology Code available for payment source | Attending: Oncology | Admitting: Oncology

## 2023-05-08 DIAGNOSIS — Z7901 Long term (current) use of anticoagulants: Secondary | ICD-10-CM | POA: Insufficient documentation

## 2023-05-08 DIAGNOSIS — Z79899 Other long term (current) drug therapy: Secondary | ICD-10-CM | POA: Insufficient documentation

## 2023-05-08 DIAGNOSIS — Z809 Family history of malignant neoplasm, unspecified: Secondary | ICD-10-CM | POA: Insufficient documentation

## 2023-05-08 DIAGNOSIS — Z9181 History of falling: Secondary | ICD-10-CM | POA: Insufficient documentation

## 2023-05-08 DIAGNOSIS — E785 Hyperlipidemia, unspecified: Secondary | ICD-10-CM | POA: Insufficient documentation

## 2023-05-08 DIAGNOSIS — Z803 Family history of malignant neoplasm of breast: Secondary | ICD-10-CM | POA: Insufficient documentation

## 2023-05-08 DIAGNOSIS — I2699 Other pulmonary embolism without acute cor pulmonale: Secondary | ICD-10-CM | POA: Insufficient documentation

## 2023-05-08 DIAGNOSIS — E119 Type 2 diabetes mellitus without complications: Secondary | ICD-10-CM | POA: Insufficient documentation

## 2023-05-08 DIAGNOSIS — I2609 Other pulmonary embolism with acute cor pulmonale: Secondary | ICD-10-CM

## 2023-05-08 NOTE — Telephone Encounter (Signed)
Patient went to [facility] today. They told her they could not find order in computer.  Patient is so confused. Please call [phone].  Patient was marked "no show" when in fact she went to [facility] this morning, and no one could find her in EPIC.

## 2023-05-09 ENCOUNTER — Encounter: Payer: Self-pay | Admitting: Oncology

## 2023-05-09 ENCOUNTER — Inpatient Hospital Stay (HOSPITAL_BASED_OUTPATIENT_CLINIC_OR_DEPARTMENT_OTHER): Payer: No Typology Code available for payment source | Admitting: Oncology

## 2023-05-09 ENCOUNTER — Inpatient Hospital Stay: Payer: No Typology Code available for payment source

## 2023-05-09 VITALS — BP 127/58 | HR 100 | Temp 97.7°F | Resp 18 | Ht 63.0 in | Wt 121.1 lb

## 2023-05-09 DIAGNOSIS — Z79899 Other long term (current) drug therapy: Secondary | ICD-10-CM | POA: Diagnosis not present

## 2023-05-09 DIAGNOSIS — Z9181 History of falling: Secondary | ICD-10-CM | POA: Insufficient documentation

## 2023-05-09 DIAGNOSIS — Z803 Family history of malignant neoplasm of breast: Secondary | ICD-10-CM | POA: Diagnosis not present

## 2023-05-09 DIAGNOSIS — Z7901 Long term (current) use of anticoagulants: Secondary | ICD-10-CM | POA: Diagnosis not present

## 2023-05-09 DIAGNOSIS — I2699 Other pulmonary embolism without acute cor pulmonale: Secondary | ICD-10-CM

## 2023-05-09 DIAGNOSIS — E119 Type 2 diabetes mellitus without complications: Secondary | ICD-10-CM

## 2023-05-09 DIAGNOSIS — Z809 Family history of malignant neoplasm, unspecified: Secondary | ICD-10-CM | POA: Diagnosis not present

## 2023-05-09 DIAGNOSIS — E785 Hyperlipidemia, unspecified: Secondary | ICD-10-CM

## 2023-05-09 LAB — COMPREHENSIVE METABOLIC PANEL
ALT: 7 U/L (ref 0–44)
AST: 14 U/L — ABNORMAL LOW (ref 15–41)
Albumin: 3.9 g/dL (ref 3.5–5.0)
Alkaline Phosphatase: 59 U/L (ref 38–126)
Anion gap: 9 (ref 5–15)
BUN: 26 mg/dL — ABNORMAL HIGH (ref 8–23)
CO2: 27 mmol/L (ref 22–32)
Calcium: 9.3 mg/dL (ref 8.9–10.3)
Chloride: 106 mmol/L (ref 98–111)
Creatinine, Ser: 1.07 mg/dL — ABNORMAL HIGH (ref 0.44–1.00)
GFR, Estimated: 51 mL/min — ABNORMAL LOW (ref >60)
Glucose, Bld: 99 mg/dL (ref 70–99)
Potassium: 4.1 mmol/L (ref 3.5–5.1)
Sodium: 142 mmol/L (ref 135–145)
Total Bilirubin: 0.4 mg/dL (ref <1.2)
Total Protein: 6.6 g/dL (ref 6.5–8.1)

## 2023-05-09 LAB — D-DIMER, QUANTITATIVE: D-Dimer, Quant: 0.32 ug{FEU}/mL (ref 0.00–0.50)

## 2023-05-09 LAB — CBC WITH DIFFERENTIAL/PLATELET
Abs Immature Granulocytes: 0.02 10*3/uL (ref 0.00–0.07)
Basophils Absolute: 0.1 10*3/uL (ref 0.0–0.1)
Basophils Relative: 1 %
Eosinophils Absolute: 0.1 10*3/uL (ref 0.0–0.5)
Eosinophils Relative: 3 %
HCT: 36.6 % (ref 36.0–46.0)
Hemoglobin: 11.8 g/dL — ABNORMAL LOW (ref 12.0–15.0)
Immature Granulocytes: 0 %
Lymphocytes Relative: 21 %
Lymphs Abs: 1 10*3/uL (ref 0.7–4.0)
MCH: 30.4 pg (ref 26.0–34.0)
MCHC: 32.2 g/dL (ref 30.0–36.0)
MCV: 94.3 fL (ref 80.0–100.0)
Monocytes Absolute: 0.5 10*3/uL (ref 0.1–1.0)
Monocytes Relative: 10 %
Neutro Abs: 3.3 10*3/uL (ref 1.7–7.7)
Neutrophils Relative %: 65 %
Platelets: 264 10*3/uL (ref 150–400)
RBC: 3.88 MIL/uL (ref 3.87–5.11)
RDW: 13.9 % (ref 11.5–15.5)
WBC: 5.1 10*3/uL (ref 4.0–10.5)
nRBC: 0 % (ref 0.0–0.2)

## 2023-05-09 NOTE — Progress Notes (Signed)
[facility]  HEMATOLOGY CLINIC CONSULTATION NOTE    PATIENT NAME: Melissa [name]   MR#: [id number] DOB: 1934/11/11  DATE OF SERVICE: 05/09/2023   REFERRING PHYSICIAN  Natalia Leatherwood, DO  Patient Care Team: Natalia Leatherwood, DO as PCP - General (Family Medicine) Edmon Crape, MD as Consulting Physician (Ophthalmology) Allie Bossier, MD as Consulting Physician (Obstetrics and Gynecology) Lennette Bihari, MD as Consulting Physician (Cardiology) Ardell Isaacs, Forrestine Him, MD as Consulting Physician (Obstetrics and Gynecology) [facility], [facility]   REASON FOR CONSULTATION/ CHIEF COMPLAINT:  Management decisions regarding anticoagulation in a patient with pulmonary embolism, diagnosed September 2024  HISTORY OF PRESENT ILLNESS:  Melissa [name] is a 88 y.o. lady with a past medical history of type 2 diabetes mellitus, dyslipidemia, history of shoulder fracture, was referred to our service for management decisions regarding anticoagulation, given her history of pulmonary embolism diagnosed in September 2024.  Discussed the use of AI scribe software for clinical note transcription with the patient, who gave verbal consent to proceed.   Patient was admitted to the hospital on 02/25/2023 after she presented with complaints of elevated heart rate, fatigue, dyspnea on exertion.  In the ED, D-dimer was elevated at 6.24.  CTA chest demonstrated a proximal segmental RML and small subsegmental LUL pulmonary embolus without evidence of right heart strain in addition to a small peripheral consolidation concerning for atelectasis versus infarct.  She was admitted for further management.  Initially she was on heparin drip and later transitioned to Eliquis.  Based on history, pulmonary embolism seems to be unprovoked.  Hence she was referred to [place] for further evaluation and also recommendations regarding anticoagulation.  She has been compliant with Eliquis 5 mg  twice daily and reports no problems with the medication and denies any bleeding or bruising. She lives alone and has a history of falls, including one prior to her hospital admission. She also has a large varicose vein in her leg that has not ruptured. She reports occasional dizziness upon waking up in the morning but waits for it to pass before getting up. She also mentions occasional wheezing when she exerts herself but has an inhaler for it. She used to smoke in her twenties but quit a long time ago.  She denies fever, cough, diarrhea, or other infectious symptoms.  She denies epistaxis, bloody stool, melena, hematuria, bruising or other bleeding symptoms. She also denies unintentional weight loss, night sweats or other constitutional symptoms.  MEDICAL HISTORY Past Medical History:  Diagnosis Date   Burning tongue syndrome 11/11/2015   Colon polyps    Cystocele with prolapse 2019   Dr. Marice Potter- Monitor for now.    Detached retina, right    Diabetes mellitus    Diaphragmatic hernia    GERD (gastroesophageal reflux disease)    Hyperlipidemia    Insomnia    Macular degeneration      SURGICAL HISTORY Past Surgical History:  Procedure Laterality Date   ABDOMINAL HYSTERECTOMY     BREAST CYST ASPIRATION  11/03/2020   FOOT SURGERY     OOPHORECTOMY     Rt.ovary removed with Hyst   RETINAL DETACHMENT SURGERY     TONSILLECTOMY       SOCIAL HISTORY: She reports that she has never smoked. She has never used smokeless tobacco. She reports that she does not drink alcohol and does not use drugs. Social History   Socioeconomic History   Marital status:  Widowed    Spouse name: Not on file   Number of children: Not on file   Years of education: Not on file   Highest education level: Not on file  Occupational History   Not on file  Tobacco Use   Smoking status: Never   Smokeless tobacco: Never  Vaping Use   Vaping status: Never Used  Substance and Sexual Activity   Alcohol use: No    Drug use: No   Sexual activity: Not Currently    Birth control/protection: Surgical    Comment: Hyst--Lt.ovary remains  Other Topics Concern   Not on file  Social History Narrative   Marital status/children/pets: Widowed.   Education/employment: 12th grade education.  Retired.   Safety:      -smoke alarm in the home:Yes     - wears seatbelt: Yes     - Feels safe in their relationships: Yes   Social Determinants of Health   Financial Resource Strain: Low Risk  (03/20/2023)   Received from [facility]   Overall Financial Resource Strain (CARDIA)    Difficulty of Paying Living Expenses: Not hard at all  Food Insecurity: No Food Insecurity (05/09/2023)   Hunger Vital Sign    Worried About Running Out of Food in the Last Year: Never true    Ran Out of Food in the Last Year: Never true  Transportation Needs: No Transportation Needs (05/09/2023)   PRAPARE - Administrator, Civil Service (Medical): No    Lack of Transportation (Non-Medical): No  Physical Activity: Insufficiently Active (03/20/2023)   Received from [facility]   Exercise Vital Sign    Days of Exercise per Week: 2 days    Minutes of Exercise per Session: 10 min  Stress: No Stress Concern Present (03/20/2023)   Received from [facility] - Occupational Stress Questionnaire    Feeling of Stress : Not at all  Social Connections: Moderately Integrated (03/20/2023)   Received from [facility]   Social Connection and Isolation Panel [NHANES]    Frequency of Communication with Friends and Family: Not on file    Frequency of Social Gatherings with Friends and Family: Three times a week    Attends Religious Services: More than 4 times per year    Active Member of Clubs or Organizations: Yes    Attends Banker Meetings: More than 4 times per year    Marital Status: Widowed  Intimate Partner Violence: Not At Risk (05/09/2023)   Humiliation,  Afraid, Rape, and Kick questionnaire    Fear of Current or Ex-Partner: No    Emotionally Abused: No    Physically Abused: No    Sexually Abused: No    FAMILY HISTORY: Her family history includes Breast cancer in her mother and another family member; CAD in her father; Cancer in her maternal grandmother and paternal grandmother; Heart disease in an other family member.  CURRENT MEDICATIONS   Current Outpatient Medications  Medication Instructions   albuterol (VENTOLIN HFA) 108 (90 Base) MCG/ACT inhaler 2 puffs, Inhalation, Every 6 hours PRN   apixaban (ELIQUIS) 5 mg, Oral, 2 times daily   atorvastatin (LIPITOR) 40 mg, Oral, Daily   Calcium Carbonate-Vitamin D (OYSTER SHELL CALCIUM/D) 250-125 MG-UNIT TABS Oral   escitalopram (LEXAPRO) 5 mg, Oral, Daily   fluocinonide cream (LIDEX) 0.05 % 1 Application, Topical, 2 times daily   gabapentin (NEURONTIN) 300 mg, Oral, Daily at bedtime  metFORMIN (GLUCOPHAGE) 500 MG tablet 500 mg (one tablet) in the morning and 1,000 mg (2 tablets) at night   metoprolol succinate (TOPROL-XL) 37.5 mg, Oral, Daily   Multiple Vitamin (MULTIVITAMIN) tablet 1 tablet, Oral, Daily,     pantoprazole (PROTONIX) 40 mg, Oral, Daily     ALLERGIES  She has No Known Allergies.  REVIEW OF SYSTEMS:  Review of Systems - Oncology   Rest of the pertinent review of systems is unremarkable except as mentioned above in HPI.  PHYSICAL EXAMINATION:  ECOG PERFORMANCE STATUS: 1 - Symptomatic but completely ambulatory  Vitals:   05/09/23 1011  BP: (!) 127/58  Pulse: 100  Resp: 18  Temp: 97.7 F (36.5 C)  SpO2: 96%   Filed Weights   05/09/23 1011  Weight: 121 lb 1.6 oz (54.9 kg)    Physical Exam Constitutional:      General: She is not in acute distress.    Appearance: Normal appearance.  HENT:     Head: Normocephalic and atraumatic.  Eyes:     General: No scleral icterus.    Conjunctiva/sclera: Conjunctivae normal.  Cardiovascular:     Rate and Rhythm:  Normal rate and regular rhythm.     Heart sounds: Normal heart sounds.  Pulmonary:     Effort: Pulmonary effort is normal.     Breath sounds: Normal breath sounds.  Abdominal:     General: There is no distension.  Musculoskeletal:     Right lower leg: No edema.     Left lower leg: No edema.     Comments: Varicose veins noted on both lower extremities  Neurological:     General: No focal deficit present.     Mental Status: She is alert and oriented to person, place, and time.  Psychiatric:        Mood and Affect: Mood normal.        Behavior: Behavior normal.        Thought Content: Thought content normal.      LABORATORY DATA:   I have reviewed the data as listed.  Results for orders placed or performed in visit on 05/09/23  D-dimer, quantitative  Result Value Ref Range   D-Dimer, Quant 0.32 0.00 - 0.50 ug/mL-FEU  CBC with Differential/Platelet  Result Value Ref Range   WBC 5.1 4.0 - 10.5 K/uL   RBC 3.88 3.87 - 5.11 MIL/uL   Hemoglobin 11.8 (L) 12.0 - 15.0 g/dL   HCT 79.8 92.1 - 19.4 %   MCV 94.3 80.0 - 100.0 fL   MCH 30.4 26.0 - 34.0 pg   MCHC 32.2 30.0 - 36.0 g/dL   RDW 17.4 08.1 - 44.8 %   Platelets 264 150 - 400 K/uL   nRBC 0.0 0.0 - 0.2 %   Neutrophils Relative % 65 %   Neutro Abs 3.3 1.7 - 7.7 K/uL   Lymphocytes Relative 21 %   Lymphs Abs 1.0 0.7 - 4.0 K/uL   Monocytes Relative 10 %   Monocytes Absolute 0.5 0.1 - 1.0 K/uL   Eosinophils Relative 3 %   Eosinophils Absolute 0.1 0.0 - 0.5 K/uL   Basophils Relative 1 %   Basophils Absolute 0.1 0.0 - 0.1 K/uL   Immature Granulocytes 0 %   Abs Immature Granulocytes 0.02 0.00 - 0.07 K/uL  Comprehensive metabolic panel  Result Value Ref Range   Sodium 142 135 - 145 mmol/L   Potassium 4.1 3.5 - 5.1 mmol/L   Chloride 106 98 - 111 mmol/L  CO2 27 22 - 32 mmol/L   Glucose, Bld 99 70 - 99 mg/dL   BUN 26 (H) 8 - 23 mg/dL   Creatinine, Ser 0.10 (H) 0.44 - 1.00 mg/dL   Calcium 9.3 8.9 - 27.2 mg/dL   Total Protein  6.6 6.5 - 8.1 g/dL   Albumin 3.9 3.5 - 5.0 g/dL   AST 14 (L) 15 - 41 U/L   ALT 7 0 - 44 U/L   Alkaline Phosphatase 59 38 - 126 U/L   Total Bilirubin 0.4 <1.2 mg/dL   GFR, Estimated 51 (L) >60 mL/min   Anion gap 9 5 - 15     Lab Results  Component Value Date   IRON 119 03/05/2019   TIBC 366 03/05/2019   FERRITIN 17 03/05/2019       RADIOGRAPHIC STUDIES:  Reviewed CT angiogram of chest and ultrasound of upper and lower extremities from September 2024.  ASSESSMENT & PLAN:  88 y.o. lady with a past medical history of type 2 diabetes mellitus, dyslipidemia, history of shoulder fracture, was referred to our service for management decisions regarding anticoagulation, given her history of pulmonary embolism diagnosed in September 2024.  Acute pulmonary embolism (HCC) - Bilateral pulmonary emboli diagnosed in September 2024. No recent surgeries, long trips, or immobilization. No current symptoms of chest pain or shortness of breath. On Eliquis 5 mg twice daily without any reported side effects or bleeding.  -Given bilateral pulmonary emboli, advised anticoagulation for minimum of 6 months.  -Based on history, pulmonary emboli seems to be unprovoked.  Hence it would be prudent to rule out any underlying hypercoagulable state.  Today we checked prothrombin gene mutation, factor V Leiden mutation, beta-2 glycoprotein antibodies, anticardiolipin antibodies for further evaluation.  Rest of the hypercoagulable workup will be deferred given the fact that she had recent thromboembolism and current Eliquis use, which can result in falsely abnormal labs.  -D-dimer is better at 0.32 today, indicating adequate anticoagulation.  Plan to continue Eliquis at current dosing.  -Plan to reassess in four months (March) for possible discontinuation of Eliquis at six months post-diagnosis, pending results of hypercoagulable workup.  At risk for falls History of falls, lives alone. On anticoagulation,  increasing risk of bleeding with falls. -Advised to be cautious to prevent falls, especially while on anticoagulation.  She was advised to use cane/walker for ambulation    Orders Placed This Encounter  Procedures   Comprehensive metabolic panel    Standing Status:   Future    Number of Occurrences:   1    Standing Expiration Date:   05/08/2024   CBC with Differential/Platelet    Standing Status:   Future    Number of Occurrences:   1    Standing Expiration Date:   05/08/2024   D-dimer, quantitative    Standing Status:   Future    Number of Occurrences:   1    Standing Expiration Date:   05/08/2024   Prothrombin gene mutation    Standing Status:   Future    Number of Occurrences:   1    Standing Expiration Date:   05/08/2024   Factor 5 Leiden*    Standing Status:   Future    Number of Occurrences:   1    Standing Expiration Date:   05/08/2024   Beta-2-glycoprotein i abs, IgG/M/A    Standing Status:   Future    Number of Occurrences:   1    Standing Expiration Date:   05/08/2024  Cardiolipin antibodies, IgG, IgM, IgA    Standing Status:   Future    Number of Occurrences:   1    Standing Expiration Date:   05/08/2024    The total time spent in the appointment was 55 minutes encounter with the patient, including review of chart and results of various tests, discussion about plan of care and coordination of care plan.  I reviewed lab results and outside records for this visit and discussed relevant results with the patient. Diagnosis, plan of care and treatment options were also discussed in detail with the patient. Opportunity provided to ask questions and answers provided to her apparent satisfaction. Provided instructions to call our clinic with any problems, questions or concerns prior to return visit. I recommended to continue follow-up with PCP and sub-specialists. She verbalized understanding and agreed with the plan. No barriers to learning was detected.   Future  Appointments  Date Time Provider Department Center  07/11/2023 11:20 AM Jodelle Gross, NP [facility] None  09/05/2023 11:15 AM [facility] PHLEBOTOMIST [facility] None  09/05/2023 11:40 AM Perris Tripathi, Archie Patten, MD [facility] None  10/10/2023 10:40 AM Claiborne Billings, Renee A, DO [facility]  11/01/2023  1:30 PM [facility] ANNUAL WELLNESS VISIT [facility]     Meryl Crutch, MD  05/09/2023 12:50 PM  [facility] - A DEPT OF [facility] [address] [place] [address] Dept: [phone] Dept Fax: [phone]    This document was completed utilizing speech recognition software. Grammatical errors, random word insertions, pronoun errors, and incomplete sentences are an occasional consequence of this system due to software limitations, ambient noise, and hardware issues. Any formal questions or concerns about the content, text or information contained within the body of this dictation should be directly addressed to the provider for clarification.

## 2023-05-09 NOTE — Assessment & Plan Note (Addendum)
-   Bilateral pulmonary emboli diagnosed in September 2024. No recent surgeries, long trips, or immobilization. No current symptoms of chest pain or shortness of breath. On Eliquis 5 mg twice daily without any reported side effects or bleeding.  -Given bilateral pulmonary emboli, advised anticoagulation for minimum of 6 months.  -Based on history, pulmonary emboli seems to be unprovoked.  Hence it would be prudent to rule out any underlying hypercoagulable state.  Today we checked prothrombin gene mutation, factor V Leiden mutation, beta-2 glycoprotein antibodies, anticardiolipin antibodies for further evaluation.  Rest of the hypercoagulable workup will be deferred given the fact that she had recent thromboembolism and current Eliquis use, which can result in falsely abnormal labs.  -D-dimer is better at 0.32 today, indicating adequate anticoagulation.  Plan to continue Eliquis at current dosing.  -Plan to reassess in four months (March) for possible discontinuation of Eliquis at six months post-diagnosis, pending results of hypercoagulable workup.

## 2023-05-09 NOTE — Assessment & Plan Note (Signed)
History of falls, lives alone. On anticoagulation, increasing risk of bleeding with falls. -Advised to be cautious to prevent falls, especially while on anticoagulation.  She was advised to use cane/walker for ambulation

## 2023-05-10 LAB — BETA-2-GLYCOPROTEIN I ABS, IGG/M/A
Beta-2 Glyco I IgG: <9 GPI IgG units (ref 0–20)
Beta-2-Glycoprotein I IgA: <9 GPI IgA units (ref 0–25)
Beta-2-Glycoprotein I IgM: <9 GPI IgM units (ref 0–32)

## 2023-05-10 LAB — CARDIOLIPIN ANTIBODIES, IGG, IGM, IGA
Anticardiolipin IgA: <9 U/mL (ref 0–11)
Anticardiolipin IgG: <9 [GPL'U]/mL (ref 0–14)
Anticardiolipin IgM: <9 [MPL'U]/mL (ref 0–12)

## 2023-05-15 ENCOUNTER — Telehealth: Payer: Self-pay

## 2023-05-15 LAB — FACTOR 5 LEIDEN

## 2023-05-15 NOTE — Telephone Encounter (Signed)
Patient had lab appt on 11/19 at [facility]. Patient has not received results yet. Her children are concerned.    Please call [phone]

## 2023-05-15 NOTE — Telephone Encounter (Signed)
Please advise. Labs were done on 11/19

## 2023-05-16 LAB — PROTHROMBIN GENE MUTATION

## 2023-05-16 NOTE — Telephone Encounter (Signed)
Per EMR review she was seen at oncology at 11/19 and they ordered and performed the labs.  She would need to call her oncology team for those results.

## 2023-05-16 NOTE — Telephone Encounter (Signed)
Spoke with patient regarding results/recommendations.

## 2023-05-19 ENCOUNTER — Emergency Department (HOSPITAL_BASED_OUTPATIENT_CLINIC_OR_DEPARTMENT_OTHER): Payer: No Typology Code available for payment source

## 2023-05-19 ENCOUNTER — Emergency Department (HOSPITAL_BASED_OUTPATIENT_CLINIC_OR_DEPARTMENT_OTHER): Payer: No Typology Code available for payment source | Admitting: Radiology

## 2023-05-19 ENCOUNTER — Observation Stay (HOSPITAL_BASED_OUTPATIENT_CLINIC_OR_DEPARTMENT_OTHER)
Admission: EM | Admit: 2023-05-19 | Discharge: 2023-05-21 | Disposition: A | Discharge status: Home / Self Care | Payer: No Typology Code available for payment source | Attending: Internal Medicine | Admitting: Internal Medicine

## 2023-05-19 ENCOUNTER — Other Ambulatory Visit: Payer: Self-pay

## 2023-05-19 ENCOUNTER — Encounter (HOSPITAL_BASED_OUTPATIENT_CLINIC_OR_DEPARTMENT_OTHER): Payer: Self-pay

## 2023-05-19 DIAGNOSIS — R059 Cough, unspecified: Secondary | ICD-10-CM | POA: Diagnosis not present

## 2023-05-19 DIAGNOSIS — R531 Weakness: Secondary | ICD-10-CM | POA: Insufficient documentation

## 2023-05-19 DIAGNOSIS — Z86711 Personal history of pulmonary embolism: Secondary | ICD-10-CM | POA: Diagnosis not present

## 2023-05-19 DIAGNOSIS — Z7984 Long term (current) use of oral hypoglycemic drugs: Secondary | ICD-10-CM | POA: Diagnosis not present

## 2023-05-19 DIAGNOSIS — R112 Nausea with vomiting, unspecified: Secondary | ICD-10-CM | POA: Diagnosis not present

## 2023-05-19 DIAGNOSIS — Z7901 Long term (current) use of anticoagulants: Secondary | ICD-10-CM | POA: Insufficient documentation

## 2023-05-19 DIAGNOSIS — I4891 Unspecified atrial fibrillation: Secondary | ICD-10-CM | POA: Diagnosis not present

## 2023-05-19 DIAGNOSIS — Z7902 Long term (current) use of antithrombotics/antiplatelets: Secondary | ICD-10-CM | POA: Diagnosis not present

## 2023-05-19 DIAGNOSIS — Z1152 Encounter for screening for COVID-19: Secondary | ICD-10-CM | POA: Insufficient documentation

## 2023-05-19 DIAGNOSIS — E119 Type 2 diabetes mellitus without complications: Secondary | ICD-10-CM | POA: Diagnosis not present

## 2023-05-19 DIAGNOSIS — R630 Anorexia: Secondary | ICD-10-CM | POA: Diagnosis not present

## 2023-05-19 DIAGNOSIS — N289 Disorder of kidney and ureter, unspecified: Secondary | ICD-10-CM | POA: Diagnosis not present

## 2023-05-19 DIAGNOSIS — Z79899 Other long term (current) drug therapy: Secondary | ICD-10-CM | POA: Diagnosis not present

## 2023-05-19 DIAGNOSIS — K449 Diaphragmatic hernia without obstruction or gangrene: Secondary | ICD-10-CM | POA: Insufficient documentation

## 2023-05-19 DIAGNOSIS — D649 Anemia, unspecified: Secondary | ICD-10-CM

## 2023-05-19 DIAGNOSIS — K7689 Other specified diseases of liver: Secondary | ICD-10-CM | POA: Diagnosis not present

## 2023-05-19 DIAGNOSIS — I2489 Other forms of acute ischemic heart disease: Secondary | ICD-10-CM | POA: Diagnosis not present

## 2023-05-19 DIAGNOSIS — R0602 Shortness of breath: Secondary | ICD-10-CM | POA: Insufficient documentation

## 2023-05-19 DIAGNOSIS — J449 Chronic obstructive pulmonary disease, unspecified: Secondary | ICD-10-CM | POA: Diagnosis not present

## 2023-05-19 DIAGNOSIS — I10 Essential (primary) hypertension: Secondary | ICD-10-CM

## 2023-05-19 DIAGNOSIS — K5730 Diverticulosis of large intestine without perforation or abscess without bleeding: Secondary | ICD-10-CM | POA: Diagnosis not present

## 2023-05-19 LAB — URINALYSIS, ROUTINE W REFLEX MICROSCOPIC
Bilirubin Urine: NEGATIVE
Glucose, UA: NEGATIVE mg/dL
Ketones, ur: NEGATIVE mg/dL
Nitrite: NEGATIVE
Protein, ur: NEGATIVE mg/dL
Specific Gravity, Urine: >1.046 — ABNORMAL HIGH (ref 1.005–1.030)
pH: 5 (ref 5.0–8.0)

## 2023-05-19 LAB — CBC WITH DIFFERENTIAL/PLATELET
Abs Immature Granulocytes: 0.03 10*3/uL (ref 0.00–0.07)
Basophils Absolute: 0.1 10*3/uL (ref 0.0–0.1)
Basophils Relative: 1 %
Eosinophils Absolute: 0.2 10*3/uL (ref 0.0–0.5)
Eosinophils Relative: 2 %
HCT: 38.1 % (ref 36.0–46.0)
Hemoglobin: 12.3 g/dL (ref 12.0–15.0)
Immature Granulocytes: 0 %
Lymphocytes Relative: 12 %
Lymphs Abs: 1.1 10*3/uL (ref 0.7–4.0)
MCH: 30 pg (ref 26.0–34.0)
MCHC: 32.3 g/dL (ref 30.0–36.0)
MCV: 92.9 fL (ref 80.0–100.0)
Monocytes Absolute: 1 10*3/uL (ref 0.1–1.0)
Monocytes Relative: 11 %
Neutro Abs: 6.7 10*3/uL (ref 1.7–7.7)
Neutrophils Relative %: 74 %
Platelets: 271 10*3/uL (ref 150–400)
RBC: 4.1 MIL/uL (ref 3.87–5.11)
RDW: 14.2 % (ref 11.5–15.5)
WBC: 9 10*3/uL (ref 4.0–10.5)
nRBC: 0 % (ref 0.0–0.2)

## 2023-05-19 LAB — RESP PANEL BY RT-PCR (RSV, FLU A&B, COVID)  RVPGX2
Influenza A by PCR: NEGATIVE
Influenza B by PCR: NEGATIVE
Resp Syncytial Virus by PCR: NEGATIVE
SARS Coronavirus 2 by RT PCR: NEGATIVE

## 2023-05-19 LAB — MAGNESIUM
Magnesium: 0.8 mg/dL — CL (ref 1.7–2.4)
Magnesium: 3.1 mg/dL — ABNORMAL HIGH (ref 1.7–2.4)

## 2023-05-19 LAB — COMPREHENSIVE METABOLIC PANEL
ALT: 8 U/L (ref 0–44)
AST: 16 U/L (ref 15–41)
Albumin: 3.9 g/dL (ref 3.5–5.0)
Alkaline Phosphatase: 63 U/L (ref 38–126)
Anion gap: 13 (ref 5–15)
BUN: 12 mg/dL (ref 8–23)
CO2: 23 mmol/L (ref 22–32)
Calcium: 9 mg/dL (ref 8.9–10.3)
Chloride: 102 mmol/L (ref 98–111)
Creatinine, Ser: 0.78 mg/dL (ref 0.44–1.00)
GFR, Estimated: >60 mL/min (ref >60)
Glucose, Bld: 191 mg/dL — ABNORMAL HIGH (ref 70–99)
Potassium: 3.8 mmol/L (ref 3.5–5.1)
Sodium: 138 mmol/L (ref 135–145)
Total Bilirubin: 0.6 mg/dL (ref <1.2)
Total Protein: 6.7 g/dL (ref 6.5–8.1)

## 2023-05-19 LAB — TROPONIN I (HIGH SENSITIVITY)
Troponin I (High Sensitivity): 23 ng/L — ABNORMAL HIGH (ref <18)
Troponin I (High Sensitivity): 28 ng/L — ABNORMAL HIGH (ref <18)

## 2023-05-19 LAB — GLUCOSE, CAPILLARY
Glucose-Capillary: 111 mg/dL — ABNORMAL HIGH (ref 70–99)
Glucose-Capillary: 174 mg/dL — ABNORMAL HIGH (ref 70–99)
Glucose-Capillary: 100 mg/dL — ABNORMAL HIGH (ref 70–99)

## 2023-05-19 LAB — TSH: TSH: 1.561 u[IU]/mL (ref 0.350–4.500)

## 2023-05-19 LAB — PHOSPHORUS: Phosphorus: 3.9 mg/dL (ref 2.5–4.6)

## 2023-05-19 LAB — LIPASE, BLOOD: Lipase: 24 U/L (ref 11–51)

## 2023-05-19 MED ORDER — METOPROLOL SUCCINATE ER 25 MG PO TB24
37.5000 mg | ORAL_TABLET | Freq: Every day | ORAL | Status: DC
  Administered 2023-05-20: 37.5 mg via ORAL
  Filled 2023-05-19: qty 2

## 2023-05-19 MED ORDER — MAGNESIUM SULFATE 2 GM/50ML IV SOLN
2.0000 g | Freq: Once | INTRAVENOUS | Status: AC
  Administered 2023-05-19: 2 g via INTRAVENOUS
  Filled 2023-05-19 (×2): qty 50

## 2023-05-19 MED ORDER — ALBUTEROL SULFATE (2.5 MG/3ML) 0.083% IN NEBU
2.5000 mg | INHALATION_SOLUTION | Freq: Four times a day (QID) | RESPIRATORY_TRACT | Status: DC | PRN
  Administered 2023-05-19: 2.5 mg via RESPIRATORY_TRACT
  Filled 2023-05-19: qty 3

## 2023-05-19 MED ORDER — PANTOPRAZOLE SODIUM 40 MG PO TBEC
40.0000 mg | DELAYED_RELEASE_TABLET | Freq: Every day | ORAL | Status: DC
  Administered 2023-05-19 – 2023-05-21 (×3): 40 mg via ORAL
  Filled 2023-05-19 (×3): qty 1

## 2023-05-19 MED ORDER — INSULIN ASPART 100 UNIT/ML IJ SOLN: 0.0000 [IU] | Freq: Three times a day (TID) | INTRAMUSCULAR | Status: DC

## 2023-05-19 MED ORDER — ACETAMINOPHEN 325 MG PO TABS
650.0000 mg | ORAL_TABLET | Freq: Four times a day (QID) | ORAL | Status: DC | PRN
  Administered 2023-05-19: 650 mg via ORAL
  Filled 2023-05-19: qty 2

## 2023-05-19 MED ORDER — DM-GUAIFENESIN ER 30-600 MG PO TB12: 1.0000 | ORAL_TABLET | Freq: Two times a day (BID) | ORAL | Status: DC

## 2023-05-19 MED ORDER — DM-GUAIFENESIN ER 30-600 MG PO TB12
1.0000 | ORAL_TABLET | Freq: Two times a day (BID) | ORAL | Status: DC | PRN
  Administered 2023-05-20 – 2023-05-21 (×2): 1 via ORAL
  Filled 2023-05-19 (×3): qty 1

## 2023-05-19 MED ORDER — ESCITALOPRAM OXALATE 10 MG PO TABS
5.0000 mg | ORAL_TABLET | Freq: Every day | ORAL | Status: DC
Start: 2023-05-19 — End: 2023-05-21
  Administered 2023-05-19 – 2023-05-21 (×3): 5 mg via ORAL
  Filled 2023-05-19 (×3): qty 1

## 2023-05-19 MED ORDER — MAGNESIUM SULFATE 4 GM/100ML IV SOLN
4.0000 g | Freq: Once | INTRAVENOUS | Status: AC
  Administered 2023-05-19: 4 g via INTRAVENOUS
  Filled 2023-05-19: qty 100

## 2023-05-19 MED ORDER — ONDANSETRON HCL 4 MG PO TABS: 4.0000 mg | ORAL_TABLET | Freq: Four times a day (QID) | ORAL | Status: DC | PRN

## 2023-05-19 MED ORDER — IOHEXOL 300 MG/ML  SOLN
80.0000 mL | Freq: Once | INTRAMUSCULAR | Status: AC | PRN
  Administered 2023-05-19: 80 mL via INTRAVENOUS

## 2023-05-19 MED ORDER — ADULT MULTIVITAMIN W/MINERALS CH
1.0000 | ORAL_TABLET | Freq: Every day | ORAL | Status: DC
  Administered 2023-05-19 – 2023-05-21 (×3): 1 via ORAL
  Filled 2023-05-19 (×3): qty 1

## 2023-05-19 MED ORDER — METFORMIN HCL 500 MG PO TABS: 1000.0000 mg | ORAL_TABLET | Freq: Two times a day (BID) | ORAL | Status: DC

## 2023-05-19 MED ORDER — ENSURE ENLIVE PO LIQD
237.0000 mL | Freq: Two times a day (BID) | ORAL | Status: DC
Start: 2023-05-19 — End: 2023-05-21
  Administered 2023-05-19 – 2023-05-21 (×5): 237 mL via ORAL

## 2023-05-19 MED ORDER — ATORVASTATIN CALCIUM 40 MG PO TABS
40.0000 mg | ORAL_TABLET | Freq: Every evening | ORAL | Status: DC
  Administered 2023-05-19 – 2023-05-20 (×2): 40 mg via ORAL
  Filled 2023-05-19 (×2): qty 1

## 2023-05-19 MED ORDER — APIXABAN 5 MG PO TABS
5.0000 mg | ORAL_TABLET | Freq: Two times a day (BID) | ORAL | Status: DC
  Administered 2023-05-19 – 2023-05-21 (×5): 5 mg via ORAL
  Filled 2023-05-19 (×5): qty 1

## 2023-05-19 MED ORDER — GABAPENTIN 300 MG PO CAPS
300.0000 mg | ORAL_CAPSULE | Freq: Every day | ORAL | Status: DC
  Administered 2023-05-19 – 2023-05-20 (×2): 300 mg via ORAL
  Filled 2023-05-19 (×2): qty 1

## 2023-05-19 MED ORDER — ONDANSETRON HCL 4 MG/2ML IJ SOLN
4.0000 mg | Freq: Once | INTRAMUSCULAR | Status: DC
  Filled 2023-05-19: qty 2

## 2023-05-19 MED ORDER — SENNOSIDES-DOCUSATE SODIUM 8.6-50 MG PO TABS
1.0000 | ORAL_TABLET | Freq: Every evening | ORAL | Status: DC | PRN
  Administered 2023-05-20: 1 via ORAL
  Filled 2023-05-19: qty 1

## 2023-05-19 MED ORDER — INSULIN ASPART 100 UNIT/ML IJ SOLN
0.0000 [IU] | Freq: Three times a day (TID) | INTRAMUSCULAR | Status: DC
  Administered 2023-05-19: 2 [IU] via SUBCUTANEOUS
  Administered 2023-05-20: 3 [IU] via SUBCUTANEOUS
  Administered 2023-05-20: 2 [IU] via SUBCUTANEOUS
  Administered 2023-05-21: 1 [IU] via SUBCUTANEOUS

## 2023-05-19 MED ORDER — METOPROLOL TARTRATE 25 MG PO TABS
37.5000 mg | ORAL_TABLET | Freq: Once | ORAL | Status: AC
  Administered 2023-05-19: 37.5 mg via ORAL
  Filled 2023-05-19: qty 2

## 2023-05-19 MED ORDER — METOPROLOL TARTRATE 5 MG/5ML IV SOLN
5.0000 mg | INTRAVENOUS | Status: AC | PRN
  Administered 2023-05-19 (×2): 5 mg via INTRAVENOUS
  Filled 2023-05-19: qty 5

## 2023-05-19 MED ORDER — ONDANSETRON HCL 4 MG/2ML IJ SOLN: 4.0000 mg | Freq: Four times a day (QID) | INTRAMUSCULAR | Status: DC | PRN

## 2023-05-19 NOTE — ED Notes (Signed)
Patient transported to CT 

## 2023-05-19 NOTE — H&P (Signed)
History and Physical    Patient: Melissa [name] ONG:[id number] DOB: [birth year] DOA: 05/19/2023 DOS: the patient was seen and examined on 05/19/2023 PCP: Natalia Leatherwood, DO  Patient coming from: [facility]  Chief Complaint:  Chief Complaint  Patient presents with   Abdominal Pain   HPI: Melissa [name] is a 88 y.o. female with medical history significant of PE on Eliquis, T2DM, GERD, depression, HLD and HTN who presented to [facility] ED for evaluation of nausea, poor appetite, palpitations and chest discomfort. Patient states that she ate at a [employer] Tuesday evening and since Tuesday night, she has felt unwell. She reports having nausea and no appetite. She had 1 episode of emesis 2 days ago and since then has had decreased p.o. intake. She has not taken most of her meds except her Eliquis for the last 2 days because she is afraid to take them on an empty stomach.  She reports a history of palpitations but not recently.  She endorsed mild cough but denies any fevers, chills, chest pain, abdominal pain, headaches or dizziness.  [facility] ED course: Significant tachycardia up to the 180s, improved to the 110s to 120s after 2 doses of IV metoprolol 5 mg, metoprolol tartrate 37.5 mg x 1. Rest of her vitals were normal. Labs showed normal CBC, K+ 3.8, creatinine 0.78, glucose 191, lipase 24, magnesium 0.8, Troponin 23-28, TSH 1.56, normal COVID and flu test. Initial EKG showed A-fib with RVR, HR 151 with RBBB.  Repeat EKG  after beta-blocker showed sinus rhythm with HR 75 and known RBBB.  Checks x-ray showed known COPD and large hiatal hernia but no acute cardiopulmonary disease. CT A/P with no acute intra-abdominal abnormalities. Patient admitted to [facility] service and transferred to [facility].  Review of Systems: As mentioned in the history of present illness. All other systems reviewed and are negative. Past Medical History:  Diagnosis Date   Burning tongue syndrome 11/11/2015    Colon polyps    Cystocele with prolapse 2019   Dr. Marice Potter- Monitor for now.    Detached retina, right    Diabetes mellitus    Diaphragmatic hernia    GERD (gastroesophageal reflux disease)    Hyperlipidemia    Insomnia    Macular degeneration    Past Surgical History:  Procedure Laterality Date   ABDOMINAL HYSTERECTOMY     BREAST CYST ASPIRATION  11/03/2020   FOOT SURGERY     OOPHORECTOMY     Rt.ovary removed with Hyst   RETINAL DETACHMENT SURGERY     TONSILLECTOMY     Social History:  reports that she has never smoked. She has never used smokeless tobacco. She reports that she does not drink alcohol and does not use drugs.  No Known Allergies  Family History  Problem Relation Age of Onset   Heart disease Other    Breast cancer Other    Breast cancer Mother        metastatic--to bone   CAD Father    Cancer Maternal Grandmother        unknown   Cancer Paternal Grandmother        colon    Prior to Admission medications   Medication Sig Start Date End Date Taking? Authorizing Provider  albuterol (VENTOLIN HFA) 108 (90 Base) MCG/ACT inhaler Inhale 2 puffs into the lungs every 6 (six) hours as needed for wheezing or shortness of breath. 08/19/22   Kuneff, Renee A, DO  apixaban (ELIQUIS) 5 MG TABS tablet Take 1  tablet (5 mg total) by mouth 2 (two) times daily. 04/05/23   Kuneff, Renee A, DO  atorvastatin (LIPITOR) 40 MG tablet Take 1 tablet (40 mg total) by mouth daily. 04/26/23   Kuneff, Renee A, DO  Calcium Carbonate-Vitamin D (OYSTER SHELL CALCIUM/D) 250-125 MG-UNIT TABS Take by mouth.    [provider]  escitalopram (LEXAPRO) 5 MG tablet Take 1 tablet (5 mg total) by mouth daily. 04/26/23   Kuneff, Renee A, DO  fluocinonide cream (LIDEX) 0.05 % Apply 1 Application topically 2 (two) times daily. Patient not taking: Reported on 05/09/2023 05/17/22   Felix Pacini A, DO  gabapentin (NEURONTIN) 300 MG capsule Take 1 capsule (300 mg total) by mouth at bedtime. 04/26/23    Kuneff, Renee A, DO  metFORMIN (GLUCOPHAGE) 500 MG tablet 500 mg (one tablet) in the morning and 1,000 mg (2 tablets) at night 04/26/23   Kuneff, Renee A, DO  metoprolol succinate (TOPROL-XL) 25 MG 24 hr tablet Take 1.5 tablets (37.5 mg total) by mouth daily. 04/11/23   Lennette Bihari, MD  Multiple Vitamin (MULTIVITAMIN) tablet Take 1 tablet by mouth daily.    [provider]  pantoprazole (PROTONIX) 40 MG tablet Take 1 tablet (40 mg total) by mouth daily. 11/09/22   Felix Pacini A, DO    Physical Exam: Vitals:   05/19/23 0605 05/19/23 0645 05/19/23 0917 05/19/23 1028  BP:  114/70 129/72 133/68  Pulse: 87 78 76   Resp: (!) 24 18 (!) 29 18  Temp:   98.3 F (36.8 C) 98.2 F (36.8 C)  TempSrc:   Oral Oral  SpO2: 92% 94% 93% 93%  Weight:      Height:       General: Pleasant, well-appearing elderly woman laying in bed. No acute distress. HEENT: [place]/AT. Anicteric sclera CV: RRR. No murmurs, rubs, or gallops. No LE edema Pulmonary: Lungs CTAB. Normal effort. No wheezing or rales. Abdominal: Soft, nontender, nondistended. Normal bowel sounds. Extremities: Palpable radial and DP pulses. Normal ROM. Skin: Warm and dry. No obvious rash or lesions. Neuro: A&Ox3. Moves all extremities. Normal sensation to light touch. No focal deficit. Psych: Normal mood and affect  Data Reviewed:  Labs showed normal CBC, K+ 3.8, creatinine 0.78, glucose 191, lipase 24, magnesium 0.8, Troponin 23-28, TSH 1.56, normal COVID and flu test. Initial EKG showed A-fib with RVR, HR 151 with RBBB.  Repeat EKG  after beta-blocker showed sinus rhythm with HR 75 and known RBBB.  Checks x-ray showed known COPD and large hiatal hernia but no acute cardiopulmonary disease. CT A/P with no acute intra-abdominal abnormalities.  Assessment and Plan: Melissa [name] is a 88 y.o. female with medical history significant of PE on Eliquis, T2DM, GERD, depression, HLD and HTN who presented to [facility] ED for evaluation of  nausea, poor appetite, cough and found to have new A-fib on admission.  # New onset A-fib, resolved Patient with history of tachycardia and PE on Eliquis presenting for evaluation after not feeling well for the past 3 days found to have new onset A-fib with RVR. Patient received IV and p.o. metoprolol and spontaneously converted to normal sinus in the ED. K+ 3.8, mag 0.8->3.1, Phos 3.9, TSH 1.56.  Troponins flat. She denies any chest pain or palpitations on exam. New A-fib likely secondary to hypomagnesemia. -Admit to telemetry bed -Continue Toprol-XL 37.5 mg daily -Trend and replete electrolytes  # Hypomagnesemia, resolved Patient found to have low magnesium of 0.8 on admission. Received a total of  6 g of IV mag with improvement to 3.1. Normal K+ and phosphate. -Telemetry -Trend and replete electrolytes  # Generalized weakness # Poor appetite, nausea Reports feeling overall weak but no focal weakness. She has had decreased p.o. intake over the last 2 days due to nausea and low appetite but denies any fatigue.  CT A/P shows diverticulosis but no acute intra-abdominal pathology.  -Registered dietitian consult, appreciate recs -Ensure twice daily between meals -As needed Zofran for nausea vomiting -PT/OT eval  # T2DM A1c 6.3% 3 weeks ago. Blood sugar of 191 on CMP. -SSI with sliding scale, CBG monitoring -Resume home metformin at discharge  # COPD -As needed albuterol for cough or wheeze  #Hx of PE -Eliquis 5 mg twice daily  # HLD -Atorvastatin 40 mg daily  # Large hiatal hernia # GERD -Protonix 40 mg daily  # Depression -Lexapro 5 mg daily   Advance Care Planning:   Code Status: Limited: Do not attempt resuscitation (DNR) -DNR-LIMITED -Do Not Intubate/DNI    Consults: None  Family Communication: No family at bedside  Severity of Illness: The appropriate patient status for this patient is INPATIENT. Inpatient status is judged to be reasonable and necessary in order to  provide the required intensity of service to ensure the patient's safety. The patient's presenting symptoms, physical exam findings, and initial radiographic and laboratory data in the context of their chronic comorbidities is felt to place them at high risk for further clinical deterioration. Furthermore, it is not anticipated that the patient will be medically stable for discharge from the hospital within 2 midnights of admission.   * I certify that at the point of admission it is my clinical judgment that the patient will require inpatient hospital care spanning beyond 2 midnights from the point of admission due to high intensity of service, high risk for further deterioration and high frequency of surveillance required.*  Author: Steffanie Rainwater, MD 05/19/2023 10:44 AM  For on call review [email or web address].

## 2023-05-19 NOTE — ED Provider Notes (Signed)
Assumed care from Dr. Wilkie Aye.  Patient here with multiple days of nausea, vomiting, feeling generally unwell with abdominal pain.  Patient also reports she has had a productive cough and felt like she was coming down with something starting on Tuesday.  Upon arrival here patient's heart rate was 180 and in atrial fibrillation which was new in onset.  Patient has a prior history of PE and was already anticoagulated however converted to sinus rhythm with beta-blocker.  Patient's magnesium significantly low today at 0.8 but potassium and calcium levels are normal.  Patient getting magnesium replacement.  CT negative for acute findings.  Chest x-ray without evidence of pneumonia.  However given patient's concern for dehydration, hypomagnesemia and new dysrhythmia which now thankfully has resolved will admit for ongoing replacement, hydration and monitoring.  Discussed with the patient and her family.  They are comfortable with this plan.   Gwyneth Sprout, MD 05/19/23 [phone]

## 2023-05-19 NOTE — ED Notes (Signed)
ED Provider at bedside. 

## 2023-05-19 NOTE — ED Notes (Signed)
ED TO INPATIENT HANDOFF REPORT  ED Nurse Name and Phone #: Taisia Fantini, MSN, RN, [place] [phone]  S Name/Age/Gender Melissa [name] 88 y.o. female Room/Bed: DB012/DB012  Code Status   Code Status: Prior  Home/SNF/Other Home Patient oriented to: self, place, time, and situation Is this baseline? Yes   Triage Complete: Triage complete  Chief Complaint New onset a-fib [facility]) [I48.91]  Triage Note Pt complaining of cough, mucus, nausea, and vomiting since Tuesday . Has not been able to eat either.  Did have some diarrhea on Tuesday.   Allergies No Known Allergies  Level of Care/Admitting Diagnosis ED Disposition     ED Disposition  Admit   Condition  --   Comment  Hospital Area: [facility] [[id number]]  Level of Care: Telemetry Medical [104]  May admit patient to [facility] or [facility] if equivalent level of care is available:: No  Interfacility transfer: Yes  Covid Evaluation: Asymptomatic - no recent exposure (last 10 days) testing not required  Diagnosis: New onset a-fib [facility]) [[id number]]  Admitting Physician: Steffanie Rainwater [[id number]]  Attending Physician: Steffanie Rainwater [[id number]]  Certification:: I certify this patient will need inpatient services for at least 2 midnights  Expected Medical Readiness: 05/21/2023          B Medical/Surgery History Past Medical History:  Diagnosis Date   Burning tongue syndrome 11/11/2015   Colon polyps    Cystocele with prolapse 2019   Dr. Marice Potter- Monitor for now.    Detached retina, right    Diabetes mellitus    Diaphragmatic hernia    GERD (gastroesophageal reflux disease)    Hyperlipidemia    Insomnia    Macular degeneration    Past Surgical History:  Procedure Laterality Date   ABDOMINAL HYSTERECTOMY     BREAST CYST ASPIRATION  11/03/2020   FOOT SURGERY     OOPHORECTOMY     Rt.ovary removed with Hyst   RETINAL DETACHMENT SURGERY     TONSILLECTOMY       A IV  Location/Drains/Wounds Patient Lines/Drains/Airways Status     Active Line/Drains/Airways     Name Placement date Placement time Site Days   Peripheral IV 05/19/23 20 G 1" Posterior;Right Forearm 05/19/23  0540  Forearm  less than 1            Intake/Output Last 24 hours No intake or output data in the 24 hours ending 05/19/23 0941  Labs/Imaging Results for orders placed or performed during the hospital encounter of 05/19/23 (from the past 48 hour(s))  CBC with Differential     Status: None   Collection Time: 05/19/23  5:39 AM  Result Value Ref Range   WBC 9.0 4.0 - 10.5 K/uL   RBC 4.10 3.87 - 5.11 MIL/uL   Hemoglobin 12.3 12.0 - 15.0 g/dL   HCT 69.6 29.5 - 28.4 %   MCV 92.9 80.0 - 100.0 fL   MCH 30.0 26.0 - 34.0 pg   MCHC 32.3 30.0 - 36.0 g/dL   RDW 13.2 44.0 - 10.2 %   Platelets 271 150 - 400 K/uL   nRBC 0.0 0.0 - 0.2 %   Neutrophils Relative % 74 %   Neutro Abs 6.7 1.7 - 7.7 K/uL   Lymphocytes Relative 12 %   Lymphs Abs 1.1 0.7 - 4.0 K/uL   Monocytes Relative 11 %   Monocytes Absolute 1.0 0.1 - 1.0 K/uL   Eosinophils Relative 2 %   Eosinophils Absolute 0.2 0.0 -  0.5 K/uL   Basophils Relative 1 %   Basophils Absolute 0.1 0.0 - 0.1 K/uL   Immature Granulocytes 0 %   Abs Immature Granulocytes 0.03 0.00 - 0.07 K/uL    Comment: Performed at [facility], [address], [place], [place] [address]  Comprehensive metabolic panel     Status: Abnormal   Collection Time: 05/19/23  5:39 AM  Result Value Ref Range   Sodium 138 135 - 145 mmol/L   Potassium 3.8 3.5 - 5.1 mmol/L   Chloride 102 98 - 111 mmol/L   CO2 23 22 - 32 mmol/L   Glucose, Bld 191 (H) 70 - 99 mg/dL    Comment: Glucose reference range applies only to samples taken after fasting for at least 8 hours.   BUN 12 8 - 23 mg/dL   Creatinine, Ser 1.91 0.44 - 1.00 mg/dL   Calcium 9.0 8.9 - 47.8 mg/dL   Total Protein 6.7 6.5 - 8.1 g/dL   Albumin 3.9 3.5 - 5.0 g/dL   AST 16 15 - 41 U/L    ALT 8 0 - 44 U/L   Alkaline Phosphatase 63 38 - 126 U/L   Total Bilirubin 0.6 <1.2 mg/dL   GFR, Estimated >29 >56 mL/min    Comment: (NOTE) Calculated using the CKD-EPI Creatinine Equation (2021)    Anion gap 13 5 - 15    Comment: Performed at [facility], [address] [place], [place] [address]  Lipase, blood     Status: None   Collection Time: 05/19/23  5:39 AM  Result Value Ref Range   Lipase 24 11 - 51 U/L    Comment: Performed at [facility], [address] [place], [place] [address]  Resp panel by RT-PCR (RSV, Flu A&B, Covid) Anterior Nasal Swab     Status: None   Collection Time: 05/19/23  5:39 AM   Specimen: Anterior Nasal Swab  Result Value Ref Range   SARS Coronavirus 2 by RT PCR NEGATIVE NEGATIVE    Comment: (NOTE) SARS-CoV-2 target nucleic acids are NOT DETECTED.  The SARS-CoV-2 RNA is generally detectable in upper respiratory specimens during the acute phase of infection. The lowest concentration of SARS-CoV-2 viral copies this assay can detect is 138 copies/mL. A negative result does not preclude SARS-Cov-2 infection and should not be used as the sole basis for treatment or other patient management decisions. A negative result may occur with  improper specimen collection/handling, submission of specimen other than nasopharyngeal swab, presence of viral mutation(s) within the areas targeted by this assay, and inadequate number of viral copies(<138 copies/mL). A negative result must be combined with clinical observations, patient history, and epidemiological information. The expected result is Negative.  Fact Sheet for Patients:  BloggerCourse.com  Fact Sheet for Healthcare Providers:  SeriousBroker.it  This test is no t yet approved or cleared by the [place] FDA and  has been authorized for detection and/or diagnosis of SARS-CoV-2 by FDA under an Emergency Use  Authorization (EUA). This EUA will remain  in effect (meaning this test can be used) for the duration of the COVID-19 declaration under Section 564(b)(1) of the Act, 21 U.S.C.section 360bbb-3(b)(1), unless the authorization is terminated  or revoked sooner.       Influenza A by PCR NEGATIVE NEGATIVE   Influenza B by PCR NEGATIVE NEGATIVE    Comment: (NOTE) The Xpert Xpress SARS-CoV-2/FLU/RSV plus assay is intended as an aid in the diagnosis of influenza from Nasopharyngeal swab specimens and  should not be used as a sole basis for treatment. Nasal washings and aspirates are unacceptable for Xpert Xpress SARS-CoV-2/FLU/RSV testing.  Fact Sheet for Patients: BloggerCourse.com  Fact Sheet for Healthcare Providers: SeriousBroker.it  This test is not yet approved or cleared by the [place] FDA and has been authorized for detection and/or diagnosis of SARS-CoV-2 by FDA under an Emergency Use Authorization (EUA). This EUA will remain in effect (meaning this test can be used) for the duration of the COVID-19 declaration under Section 564(b)(1) of the Act, 21 U.S.C. section 360bbb-3(b)(1), unless the authorization is terminated or revoked.     Resp Syncytial Virus by PCR NEGATIVE NEGATIVE    Comment: (NOTE) Fact Sheet for Patients: BloggerCourse.com  Fact Sheet for Healthcare Providers: SeriousBroker.it  This test is not yet approved or cleared by the [place] FDA and has been authorized for detection and/or diagnosis of SARS-CoV-2 by FDA under an Emergency Use Authorization (EUA). This EUA will remain in effect (meaning this test can be used) for the duration of the COVID-19 declaration under Section 564(b)(1) of the Act, 21 U.S.C. section 360bbb-3(b)(1), unless the authorization is terminated or revoked.  Performed at [facility], [address], [place], [place] [address]   Magnesium     Status: Abnormal   Collection Time: 05/19/23  5:39 AM  Result Value Ref Range   Magnesium 0.8 (LL) 1.7 - 2.4 mg/dL    Comment: REPEATED TO VERIFY CRITICAL RESULT CALLED TO, READ BACK BY AND VERIFIED WITH: Regan Lemming, RN AT [phone] ON 05/19/23 BY CM Performed at [facility], [address] [place], [place] [address]   Troponin I (High Sensitivity)     Status: Abnormal   Collection Time: 05/19/23  5:39 AM  Result Value Ref Range   Troponin I (High Sensitivity) 23 (H) <18 ng/L    Comment: (NOTE) Elevated high sensitivity troponin I (hsTnI) values and significant  changes across serial measurements may suggest ACS but many other  chronic and acute conditions are known to elevate hsTnI results.  Refer to the "Links" section for chest pain algorithms and additional  guidance. Performed at [facility], [address] [place], [place] [address]   Troponin I (High Sensitivity)     Status: Abnormal   Collection Time: 05/19/23  8:30 AM  Result Value Ref Range   Troponin I (High Sensitivity) 28 (H) <18 ng/L    Comment: (NOTE) Elevated high sensitivity troponin I (hsTnI) values and significant  changes across serial measurements may suggest ACS but many other  chronic and acute conditions are known to elevate hsTnI results.  Refer to the "Links" section for chest pain algorithms and additional  guidance. Performed at [facility], [address], [place], [place] [address]   TSH     Status: None   Collection Time: 05/19/23  8:35 AM  Result Value Ref Range   TSH 1.561 0.350 - 4.500 uIU/mL    Comment: Performed by a 3rd Generation assay with a functional sensitivity of <=0.01 uIU/mL. Performed at [facility], [address], [place], [place] [address]    CT ABDOMEN PELVIS W CONTRAST  Result Date: 05/19/2023 CLINICAL DATA:  88 year old female with history  of cough, mucus production, nausea and vomiting for the past several days. EXAM: CT ABDOMEN AND PELVIS WITH CONTRAST TECHNIQUE: Multidetector CT imaging of the abdomen and pelvis was performed using the standard protocol following bolus administration of intravenous contrast. RADIATION DOSE REDUCTION: This exam was  performed according to the departmental dose-optimization program which includes automated exposure control, adjustment of the mA and/or kV according to patient size and/or use of iterative reconstruction technique. CONTRAST:  80mL OMNIPAQUE IOHEXOL 300 MG/ML  SOLN COMPARISON:  CT the abdomen and pelvis 03/20/2023. FINDINGS: Lower chest: Massive hiatal hernia with predominantly intrathoracic stomach, similar to the prior examination. Severe calcifications of the mitral annulus. Hepatobiliary: Small low-attenuation lesions in the liver, compatible with tiny cysts and/or biliary hamartomas (no imaging follow-up recommended). No other suspicious appearing hepatic lesions. No intra or extrahepatic biliary ductal dilatation. Gallbladder is unremarkable in appearance. Pancreas: No pancreatic mass. No pancreatic ductal dilatation. No pancreatic or peripancreatic fluid collections or inflammatory changes. Spleen: Unremarkable. Adrenals/Urinary Tract: Low-attenuation lesions in both kidneys compatible with simple cysts (Bosniak class 1, no imaging follow-up recommended), largest of which measures up to 8.1 cm in the interpolar region of the left kidney. No suspicious renal lesions are noted. No hydroureteronephrosis. Urinary bladder is unremarkable in appearance. Stomach/Bowel: Intra-abdominal portion of the stomach is unremarkable. No pathologic dilatation of small bowel or colon. A few scattered colonic diverticula are noted, without surrounding inflammatory changes to indicate an acute diverticulitis at this time. The appendix is not confidently identified and may be surgically absent. Regardless, there are no  inflammatory changes noted adjacent to the cecum to suggest the presence of an acute appendicitis at this time. Vascular/Lymphatic: Atherosclerosis in the abdominal aorta and pelvic vasculature. No lymphadenopathy noted in the abdomen or pelvis. Reproductive: Status post hysterectomy. Ovaries are not confidently identified may be surgically absent or atrophic. Other: No significant volume of ascites.  No pneumoperitoneum. Musculoskeletal: There are no aggressive appearing lytic or blastic lesions noted in the visualized portions of the skeleton. IMPRESSION: 1. No definite acute findings are noted in the abdomen or pelvis to account for the patient's symptoms. 2. Extensive colonic diverticulosis without evidence of acute diverticulitis at this time. 3. Massive hiatal hernia with intrathoracic stomach, similar to the prior examination. 4. There are calcifications of the mitral annulus. Echocardiographic correlation for evaluation of potential valvular dysfunction may be warranted if clinically indicated. 5. Additional incidental findings, as above. Electronically Signed   By: Trudie Reed M.D.   On: 05/19/2023 07:31   DG Chest Port 1 View  Result Date: 05/19/2023 CLINICAL DATA:  [address] with cough, nausea and vomiting. EXAM: PORTABLE CHEST 1 VIEW COMPARISON:  CTA chest 02/25/2023 FINDINGS: The lungs are mildly emphysematous but clear. There is a large hiatal hernia. Mild cardiomegaly without overt CHF. Stable mediastinum with aortic atherosclerosis. The mitral ring is heavily calcified. There is osteopenia with a partially healed proximal left humeral fracture, which previously appeared more recent. No new osseous abnormality is seen. Degenerative change thoracic spine. IMPRESSION: 1. No evidence of acute chest disease.  Stable COPD chest. 2. Large hiatal hernia. 3. Mild cardiomegaly. 4. Aortic atherosclerosis. 5. Partially healed proximal left humeral fracture. Electronically Signed   By: Almira Bar M.D.    On: 05/19/2023 06:15    Pending Labs Unresulted Labs (From admission, onward)     Start     Ordered   05/19/23 2000  magnesium level  (magnesium replacement and lab)  Once-Timed,   STAT        05/19/23 0636   05/19/23 0528  Urinalysis, Routine w reflex microscopic -Urine, Clean Catch  Once,   URGENT       Question:  Specimen Source  Answer:  Urine, Clean Catch   05/19/23 1610  Vitals/Pain Today's Vitals   05/19/23 0645 05/19/23 0709 05/19/23 0900 05/19/23 0917  BP: 114/70   129/72  Pulse: 78   76  Resp: 18   (!) 29  Temp:    98.3 F (36.8 C)  TempSrc:    Oral  SpO2: 94%   93%  Weight:      Height:      PainSc:  5  0-No pain     Isolation Precautions No active isolations  Medications Medications  ondansetron (ZOFRAN) injection 4 mg (4 mg Intravenous Not Given 05/19/23 0636)  magnesium sulfate IVPB 4 g 100 mL (0 g Intravenous Stopping previously hung infusion 05/19/23 0927)    Followed by  magnesium sulfate IVPB 2 g 50 mL (2 g Intravenous New Bag/Given 05/19/23 0930)  metoprolol tartrate (LOPRESSOR) injection 5 mg (5 mg Intravenous Given 05/19/23 0548)  metoprolol tartrate (LOPRESSOR) tablet 37.5 mg (37.5 mg Oral Given 05/19/23 0601)  iohexol (OMNIPAQUE) 300 MG/ML solution 80 mL (80 mLs Intravenous Contrast Given 05/19/23 0716)    Mobility walks with person assist     Focused Assessments Cardiac Assessment Handoff:  Cardiac Rhythm: Atrial fibrillation No results found for: "CKTOTAL", "CKMB", "CKMBINDEX", "TROPONINI" Lab Results  Component Value Date   DDIMER 0.32 05/09/2023   Does the Patient currently have chest pain? No    R Recommendations: See Admitting Provider Note  Report given to:   Additional Notes:

## 2023-05-19 NOTE — ED Notes (Signed)
[facility] with cl called for transport

## 2023-05-19 NOTE — ED Notes (Signed)
[facility] at bedside to assume care of patient. 

## 2023-05-19 NOTE — Care Management Obs Status (Signed)
MEDICARE OBSERVATION STATUS NOTIFICATION   Patient Details  Name: EIRA NANTON MRN: [id number] Date of Birth: Nov 04, 1934   Medicare Observation Status Notification Given:       Mearl Latin, LCSW 05/19/2023, 4:54 PM

## 2023-05-19 NOTE — Care Management CC44 (Signed)
Condition Code 44 Documentation Completed  Patient Details  Name: Melissa [name] MRN: [id number] Date of Birth: [birth year]   Condition Code 44 given:  yes  Patient signature on Condition Code 44 notice:  yes  Documentation of 2 MD's agreement:  yes  Code 44 added to claim:  yes     Durenda Guthrie, RN 05/19/2023, 4:41 PM

## 2023-05-19 NOTE — ED Notes (Signed)
Attempt to call report x 1  

## 2023-05-19 NOTE — ED Triage Notes (Signed)
Pt complaining of cough, mucus, nausea, and vomiting since Tuesday . Has not been able to eat either.  Did have some diarrhea on Tuesday.

## 2023-05-19 NOTE — ED Provider Notes (Addendum)
EMERGENCY DEPARTMENT AT [facility] Provider Note   CSN: [id number] Arrival date & time: 05/19/23  4098     History  Chief Complaint  Patient presents with   Abdominal Pain    Melissa [name] is a 88 y.o. female.  HPI     This is an 88 year old female with history of hypertension, PE, tachycardia who presents with nausea, anorexia, chest pain.  Patient reports that she started feeling sick on Tuesday.  They thought she had a cold.  She has had a cough but no fevers.  She has had minimal p.o. intake since Tuesday.  Has had some liquid intake but has not been taking her medicines because she was afraid to take them on an empty stomach.  Reports that she felt nauseated but is not having significant abdominal pain.  Has had some chest discomfort.  No one around her has been sick.  Of note, patient noted to be significantly tachycardic.  Denies significant palpitations but does report history of tachycardia in the past.  No known history of atrial fibrillation.  Cardiology notes reviewed.  She is on metoprolol.  She also reports compliance with Eliquis for PE.  Home Medications Prior to Admission medications   Medication Sig Start Date End Date Taking? Authorizing Provider  albuterol (VENTOLIN HFA) 108 (90 Base) MCG/ACT inhaler Inhale 2 puffs into the lungs every 6 (six) hours as needed for wheezing or shortness of breath. 08/19/22   Kuneff, Renee A, DO  apixaban (ELIQUIS) 5 MG TABS tablet Take 1 tablet (5 mg total) by mouth 2 (two) times daily. 04/05/23   Kuneff, Renee A, DO  atorvastatin (LIPITOR) 40 MG tablet Take 1 tablet (40 mg total) by mouth daily. 04/26/23   Kuneff, Renee A, DO  Calcium Carbonate-Vitamin D (OYSTER SHELL CALCIUM/D) 250-125 MG-UNIT TABS Take by mouth.    [provider]  escitalopram (LEXAPRO) 5 MG tablet Take 1 tablet (5 mg total) by mouth daily. 04/26/23   Kuneff, Renee A, DO  fluocinonide cream (LIDEX) 0.05 % Apply 1 Application topically  2 (two) times daily. Patient not taking: Reported on 05/09/2023 05/17/22   Felix Pacini A, DO  gabapentin (NEURONTIN) 300 MG capsule Take 1 capsule (300 mg total) by mouth at bedtime. 04/26/23   Kuneff, Renee A, DO  metFORMIN (GLUCOPHAGE) 500 MG tablet 500 mg (one tablet) in the morning and 1,000 mg (2 tablets) at night 04/26/23   Kuneff, Renee A, DO  metoprolol succinate (TOPROL-XL) 25 MG 24 hr tablet Take 1.5 tablets (37.5 mg total) by mouth daily. 04/11/23   Lennette Bihari, MD  Multiple Vitamin (MULTIVITAMIN) tablet Take 1 tablet by mouth daily.    [provider]  pantoprazole (PROTONIX) 40 MG tablet Take 1 tablet (40 mg total) by mouth daily. 11/09/22   Kuneff, Renee A, DO      Allergies    Patient has no known allergies.    Review of Systems   Review of Systems  Constitutional:  Negative for fever.  Respiratory:  Positive for cough. Negative for shortness of breath.   Cardiovascular:  Positive for chest pain.  Gastrointestinal:  Positive for diarrhea, nausea and vomiting. Negative for abdominal pain.  All other systems reviewed and are negative.   Physical Exam Updated Vital Signs BP 106/66   Pulse 87   Temp 98.2 F (36.8 C) (Oral)   Resp (!) 24   Ht 1.6 m (5\' 3" )   Wt 55.8 kg   SpO2 92%  BMI 21.79 kg/m  Physical Exam Vitals and nursing note reviewed.  Constitutional:      Appearance: She is well-developed.  HENT:     Head: Normocephalic and atraumatic.  Eyes:     Pupils: Pupils are equal, round, and reactive to light.  Cardiovascular:     Rate and Rhythm: Tachycardia present. Rhythm irregular.     Heart sounds: Normal heart sounds.  Pulmonary:     Effort: Pulmonary effort is normal. No respiratory distress.     Breath sounds: No wheezing.  Abdominal:     General: Bowel sounds are normal.     Palpations: Abdomen is soft.     Tenderness: There is no abdominal tenderness.  Musculoskeletal:     Cervical back: Neck supple.  Skin:    General: Skin is  warm and dry.  Neurological:     Mental Status: She is alert and oriented to person, place, and time.  Psychiatric:        Mood and Affect: Mood normal.     ED Results / Procedures / Treatments   Labs (all labs ordered are listed, but only abnormal results are displayed) Labs Reviewed  COMPREHENSIVE METABOLIC PANEL - Abnormal; Notable for the following components:      Result Value   Glucose, Bld 191 (*)    All other components within normal limits  MAGNESIUM - Abnormal; Notable for the following components:   Magnesium 0.8 (*)    All other components within normal limits  TROPONIN I (HIGH SENSITIVITY) - Abnormal; Notable for the following components:   Troponin I (High Sensitivity) 23 (*)    All other components within normal limits  RESP PANEL BY RT-PCR (RSV, FLU A&B, COVID)  [id number]  CBC WITH DIFFERENTIAL/PLATELET  LIPASE, BLOOD  URINALYSIS, ROUTINE W REFLEX MICROSCOPIC  MAGNESIUM    EKG EKG Interpretation Date/Time:  Friday May 19 2023 05:33:58 EST Ventricular Rate:  151 PR Interval:    QRS Duration:  124 QT Interval:  346 QTC Calculation: 549 R Axis:   170  Text Interpretation: Atrial fibrillation Ventricular premature complex Right bundle branch block Afib new when compared to prior Confirmed by Ross Marcus ([id number]) on 05/19/2023 5:38:10 AM    EKG Interpretation Date/Time:  Friday May 19 2023 06:54:53 EST Ventricular Rate:  75 PR Interval:  135 QRS Duration:  129 QT Interval:  417 QTC Calculation: 466 R Axis:   120  Text Interpretation: Sinus rhythm RBBB and LPFB No longer in atrial fibrillation Confirmed by Ross Marcus ([id number]) on 05/19/2023 6:56:48 AM        Radiology DG Chest Port 1 View  Result Date: 05/19/2023 CLINICAL DATA:  10031 with cough, nausea and vomiting. EXAM: PORTABLE CHEST 1 VIEW COMPARISON:  CTA chest 02/25/2023 FINDINGS: The lungs are mildly emphysematous but clear. There is a large hiatal hernia. Mild  cardiomegaly without overt CHF. Stable mediastinum with aortic atherosclerosis. The mitral ring is heavily calcified. There is osteopenia with a partially healed proximal left humeral fracture, which previously appeared more recent. No new osseous abnormality is seen. Degenerative change thoracic spine. IMPRESSION: 1. No evidence of acute chest disease.  Stable COPD chest. 2. Large hiatal hernia. 3. Mild cardiomegaly. 4. Aortic atherosclerosis. 5. Partially healed proximal left humeral fracture. Electronically Signed   By: Almira Bar M.D.   On: 05/19/2023 06:15    Procedures .Critical Care  Performed by: Shon Baton, MD Authorized by: Shon Baton, MD   Critical care provider statement:  Critical care time (minutes):  60   Critical care was necessary to treat or prevent imminent or life-threatening deterioration of the following conditions: Atrial fibrillation, hypomagnesemia.   Critical care was time spent personally by me on the following activities:  Development of treatment plan with patient or surrogate, discussions with consultants, evaluation of patient's response to treatment, examination of patient, ordering and review of laboratory studies, ordering and review of radiographic studies, ordering and performing treatments and interventions, pulse oximetry, re-evaluation of patient's condition and review of old charts     Medications Ordered in ED Medications  ondansetron (ZOFRAN) injection 4 mg (4 mg Intravenous Not Given 05/19/23 0636)  magnesium sulfate IVPB 4 g 100 mL (has no administration in time range)    Followed by  magnesium sulfate IVPB 2 g 50 mL (has no administration in time range)  metoprolol tartrate (LOPRESSOR) injection 5 mg (5 mg Intravenous Given 05/19/23 0548)  metoprolol tartrate (LOPRESSOR) tablet 37.5 mg (37.5 mg Oral Given 05/19/23 0601)    ED Course/ Medical Decision Making/ A&P Clinical Course as of 05/19/23 0649  Fri May 19, 2023  2130  Patient heart rate improved to the 110s to 120s after 2 doses of IV metoprolol.  Confirms that she did not take her metoprolol yesterday.  Will give her oral dose.  Chest pain improved. [CH]  0641 Magnesium level 0.8.  This was replaced aggressively.  On recheck, heart rate in the 70s.  Appears to be in sinus rhythm.  Suspect magnesium may be related to recent decreased p.o. intake.  While labs are largely reassuring, will obtain CT scan to otherwise rule out any intra-abdominal pathology causing her to not tolerate p.o. [CH]    Clinical Course User Index [CH] Norwood Quezada, Mayer Masker, MD                                 Medical Decision Making Amount and/or Complexity of Data Reviewed Labs: ordered. Radiology: ordered.  Risk Prescription drug management.   This patient presents to the ED for concern of Upper respiratory symptoms, vomiting, decreased p.o. intake, this involves an extensive number of treatment options, and is a complaint that carries with it a high risk of complications and morbidity.  I considered the following differential and admission for this acute, potentially life threatening condition.  The differential diagnosis includes viral etiology, pneumonia, gastritis, gastroenteritis, atypical ACS, new onset of arrhythmia  MDM:    This is an 88 year old female who presents with concerns for decreased p.o. intake and upper respiratory symptoms.  She is overall nontoxic-appearing.  She is significantly tachycardic.  Her initial EKG shows what appears to be likely atrial fibrillation with her known right bundle branch block.  She has no history of the same but she does take metoprolol for a prior history of tachycardia and is on Eliquis for PE.  Patient was given 2 doses of IV metoprolol and 37.5 mg of metoprolol tartrate (normally on 37. Mg metoprolol XL).  This appears to have converted the patient back to sinus rhythm.  Abdominal labs obtained and reviewed.  Lipase normal.  LFTs normal.   Magnesium 0.8.  This could be the cause of the patient's tachycardia and A-fib.  This was replaced.  No significant leukocytosis.  Chest x-ray with hiatal hernia.  No obvious pneumonia.  Will obtain CT abdomen given that the patient has had vomiting and decreased p.o. over the last several days.  Anticipate admission.  (Labs, imaging, consults)  Labs: I Ordered, and personally interpreted labs.  The pertinent results include: CBC, CMP, lipase, troponin, urinalysis, magnesium  Imaging Studies ordered: I ordered imaging studies including chest x-ray, CT imaging pending I independently visualized and interpreted imaging. I agree with the radiologist interpretation  Additional history obtained from family at bedside.  External records from outside source obtained and reviewed including prior evaluations  Cardiac Monitoring: The patient was maintained on a cardiac monitor.  If on the cardiac monitor, I personally viewed and interpreted the cardiac monitored which showed an underlying rhythm of: Atrial fibrillation, then sinus  Reevaluation: After the interventions noted above, I reevaluated the patient and found that they have :improved  Social Determinants of Health:  lives with family  Disposition: Anticipate admission  Co morbidities that complicate the patient evaluation  Past Medical History:  Diagnosis Date   Burning tongue syndrome 11/11/2015   Colon polyps    Cystocele with prolapse 2019   Dr. Marice Potter- Monitor for now.    Detached retina, right    Diabetes mellitus    Diaphragmatic hernia    GERD (gastroesophageal reflux disease)    Hyperlipidemia    Insomnia    Macular degeneration      Medicines Meds ordered this encounter  Medications   metoprolol tartrate (LOPRESSOR) injection 5 mg   metoprolol tartrate (LOPRESSOR) tablet 37.5 mg   ondansetron (ZOFRAN) injection 4 mg   FOLLOWED BY Linked Order Group    magnesium sulfate IVPB 4 g 100 mL    magnesium sulfate IVPB 2  g 50 mL    I have reviewed the patients home medicines and have made adjustments as needed  Problem List / ED Course: Problem List Items Addressed This Visit   None Visit Diagnoses     New onset atrial fibrillation (HCC)    -  Primary   Relevant Medications   metoprolol tartrate (LOPRESSOR) injection 5 mg (Completed)   metoprolol tartrate (LOPRESSOR) tablet 37.5 mg (Completed)   Hypomagnesemia       Nausea and vomiting, unspecified vomiting type                       Final Clinical Impression(s) / ED Diagnoses Final diagnoses:  New onset atrial fibrillation (HCC)  Hypomagnesemia  Nausea and vomiting, unspecified vomiting type    Rx / DC Orders ED Discharge Orders     None         Claudene Gatliff, Mayer Masker, MD 05/19/23 1027    Shon Baton, MD 05/19/23 [id number]

## 2023-05-19 NOTE — ED Notes (Signed)
Patient presents to ED reporting N/V/D since Tuesday and not having an appetite since Tuesday. Patient also reports cough since Tuesday with mucus production.

## 2023-05-19 NOTE — Progress Notes (Signed)
Pt O2 sat dropped to 82-85% while sleeping. Placed on 1L [place] and sats increased to 92-96%.

## 2023-05-19 NOTE — ED Notes (Signed)
Patient reports she does feel her "heart racing" but is not having chest pain, just discomfort to mid-epigastric region of chest.

## 2023-05-20 ENCOUNTER — Other Ambulatory Visit (HOSPITAL_COMMUNITY): Payer: No Typology Code available for payment source

## 2023-05-20 ENCOUNTER — Observation Stay (HOSPITAL_COMMUNITY): Payer: No Typology Code available for payment source

## 2023-05-20 DIAGNOSIS — Z1152 Encounter for screening for COVID-19: Secondary | ICD-10-CM | POA: Diagnosis not present

## 2023-05-20 DIAGNOSIS — I4891 Unspecified atrial fibrillation: Secondary | ICD-10-CM

## 2023-05-20 DIAGNOSIS — J449 Chronic obstructive pulmonary disease, unspecified: Secondary | ICD-10-CM | POA: Diagnosis not present

## 2023-05-20 DIAGNOSIS — R112 Nausea with vomiting, unspecified: Secondary | ICD-10-CM

## 2023-05-20 DIAGNOSIS — Z7984 Long term (current) use of oral hypoglycemic drugs: Secondary | ICD-10-CM | POA: Diagnosis not present

## 2023-05-20 DIAGNOSIS — E119 Type 2 diabetes mellitus without complications: Secondary | ICD-10-CM | POA: Diagnosis not present

## 2023-05-20 DIAGNOSIS — Z86711 Personal history of pulmonary embolism: Secondary | ICD-10-CM | POA: Diagnosis not present

## 2023-05-20 DIAGNOSIS — Z7902 Long term (current) use of antithrombotics/antiplatelets: Secondary | ICD-10-CM | POA: Diagnosis not present

## 2023-05-20 DIAGNOSIS — Z79899 Other long term (current) drug therapy: Secondary | ICD-10-CM | POA: Diagnosis not present

## 2023-05-20 DIAGNOSIS — R531 Weakness: Secondary | ICD-10-CM | POA: Diagnosis not present

## 2023-05-20 DIAGNOSIS — I10 Essential (primary) hypertension: Secondary | ICD-10-CM | POA: Diagnosis not present

## 2023-05-20 DIAGNOSIS — Z7901 Long term (current) use of anticoagulants: Secondary | ICD-10-CM | POA: Diagnosis not present

## 2023-05-20 DIAGNOSIS — K449 Diaphragmatic hernia without obstruction or gangrene: Secondary | ICD-10-CM | POA: Diagnosis not present

## 2023-05-20 LAB — CBC
HCT: 38.8 % (ref 36.0–46.0)
Hemoglobin: 12 g/dL (ref 12.0–15.0)
MCH: 29.1 pg (ref 26.0–34.0)
MCHC: 30.9 g/dL (ref 30.0–36.0)
MCV: 93.9 fL (ref 80.0–100.0)
Platelets: 302 10*3/uL (ref 150–400)
RBC: 4.13 MIL/uL (ref 3.87–5.11)
RDW: 14.1 % (ref 11.5–15.5)
WBC: 5.8 10*3/uL (ref 4.0–10.5)
nRBC: 0 % (ref 0.0–0.2)

## 2023-05-20 LAB — ECHOCARDIOGRAM LIMITED
AR max vel: 2.27 cm2
AV Area VTI: 2.68 cm2
AV Area mean vel: 2.56 cm2
AV Mean grad: 5.6 mm[Hg]
AV Peak grad: 11.7 mm[Hg]
Ao pk vel: 1.71 m/s
Area-P 1/2: 2.69 cm2
Calc EF: 69.9 %
Height: 63 in
MV VTI: 1.62 cm2
S' Lateral: 2 cm
Single Plane A2C EF: 72.9 %
Single Plane A4C EF: 67.8 %
Weight: 1968 [oz_av]

## 2023-05-20 LAB — GLUCOSE, CAPILLARY
Glucose-Capillary: 163 mg/dL — ABNORMAL HIGH (ref 70–99)
Glucose-Capillary: 220 mg/dL — ABNORMAL HIGH (ref 70–99)
Glucose-Capillary: 118 mg/dL — ABNORMAL HIGH (ref 70–99)
Glucose-Capillary: 161 mg/dL — ABNORMAL HIGH (ref 70–99)

## 2023-05-20 LAB — BASIC METABOLIC PANEL
Anion gap: 8 (ref 5–15)
BUN: 13 mg/dL (ref 8–23)
CO2: 27 mmol/L (ref 22–32)
Calcium: 9.1 mg/dL (ref 8.9–10.3)
Chloride: 102 mmol/L (ref 98–111)
Creatinine, Ser: 0.85 mg/dL (ref 0.44–1.00)
GFR, Estimated: >60 mL/min (ref >60)
Glucose, Bld: 138 mg/dL — ABNORMAL HIGH (ref 70–99)
Potassium: 3.5 mmol/L (ref 3.5–5.1)
Sodium: 137 mmol/L (ref 135–145)

## 2023-05-20 LAB — MAGNESIUM: Magnesium: 2.3 mg/dL (ref 1.7–2.4)

## 2023-05-20 LAB — PHOSPHORUS: Phosphorus: 4.7 mg/dL — ABNORMAL HIGH (ref 2.5–4.6)

## 2023-05-20 MED ORDER — METOPROLOL SUCCINATE ER 25 MG PO TB24: 12.5000 mg | ORAL_TABLET | Freq: Once | ORAL | Status: DC

## 2023-05-20 MED ORDER — METOPROLOL TARTRATE 5 MG/5ML IV SOLN
5.0000 mg | INTRAVENOUS | Status: DC | PRN
  Administered 2023-05-20: 5 mg via INTRAVENOUS
  Filled 2023-05-20: qty 5

## 2023-05-20 MED ORDER — METOPROLOL TARTRATE 25 MG PO TABS
25.0000 mg | ORAL_TABLET | Freq: Two times a day (BID) | ORAL | Status: DC
  Administered 2023-05-20 – 2023-05-21 (×2): 25 mg via ORAL
  Filled 2023-05-20 (×2): qty 1

## 2023-05-20 MED ORDER — AMIODARONE LOAD VIA INFUSION
150.0000 mg | Freq: Once | INTRAVENOUS | Status: DC
  Filled 2023-05-20: qty 83.34

## 2023-05-20 MED ORDER — METOPROLOL SUCCINATE ER 50 MG PO TB24: 50.0000 mg | ORAL_TABLET | Freq: Every day | ORAL | Status: DC

## 2023-05-20 MED ORDER — POTASSIUM CHLORIDE CRYS ER 20 MEQ PO TBCR
40.0000 meq | EXTENDED_RELEASE_TABLET | Freq: Once | ORAL | Status: AC
  Administered 2023-05-20: 40 meq via ORAL
  Filled 2023-05-20: qty 2

## 2023-05-20 MED ORDER — AMIODARONE HCL IN DEXTROSE 360-4.14 MG/200ML-% IV SOLN: 30.0000 mg/h | INTRAVENOUS | Status: DC

## 2023-05-20 MED ORDER — AMIODARONE HCL IN DEXTROSE 360-4.14 MG/200ML-% IV SOLN: 60.0000 mg/h | INTRAVENOUS | Status: AC

## 2023-05-20 MED ORDER — LABETALOL HCL 5 MG/ML IV SOLN
5.0000 mg | INTRAVENOUS | Status: DC | PRN
  Filled 2023-05-20: qty 4

## 2023-05-20 NOTE — Progress Notes (Signed)
   05/20/23 0912  Assess: MEWS Score  BP 96/72  MAP (mmHg) 81  ECG Heart Rate (!) 142  SpO2 94 %  O2 Device Nasal Cannula  Patient Activity (if Appropriate) In bed  Assess: MEWS Score  MEWS Temp 0  MEWS Systolic 1  MEWS Pulse 3  MEWS RR 0  MEWS LOC 0  MEWS Score 4  MEWS Score Color Red  Assess: if the MEWS score is Yellow or Red  Were vital signs accurate and taken at a resting state? Yes  Does the patient meet 2 or more of the SIRS criteria? No  MEWS guidelines implemented  Yes, red  Treat  MEWS Interventions Considered administering scheduled or prn medications/treatments as ordered  Take Vital Signs  Increase Vital Sign Frequency  Red: Q1hr x2, continue Q4hrs until patient remains green for 12hrs  Escalate  MEWS: Escalate Red: Discuss with charge nurse and notify provider. Consider notifying RRT. If remains red for 2 hours consider need for higher level of care  Notify: Charge Nurse/RN  Name of Charge Nurse/RN Notified stephanie  Provider Notification  Provider Name/Title chiu  Date Provider Notified 05/20/23  Time Provider Notified [phone]  Method of Notification Page  Notification Reason New onset of dysrhythmia  Provider response See new orders  Date of Provider Response 05/20/23  Time of Provider Response 0927  Assess: SIRS CRITERIA  SIRS Temperature  0  SIRS Pulse 1  SIRS Respirations  0  SIRS WBC 0  SIRS Score Sum  1

## 2023-05-20 NOTE — Consult Note (Addendum)
Cardiology Consultation   Patient ID: Melissa [name] MRN: [id number]; DOB: 04-16-1935  Admit date: 05/19/2023 Date of Consult: 05/20/2023  PCP:  Natalia Leatherwood, DO   [facility] Providers Cardiologist:  None        Patient Profile:   Melissa [name] is a 88 y.o. female with a history of palpitations, RBBB, bilateral PE in 02/2023 on Eliquis, hyperlipidemia, type 2 diabetes mellitus, and GERD  who is being seen 05/20/2023 for the evaluation of new onset atrial fibrillation at the request of Dr. Rhona Leavens.  History of Present Illness:   Melissa [name] is a 88 year old female with the above history who is followed by Dr. Tresa Endo.  She has a history of atypical nonexertional chest pain but has not been felt to be cardiac in the past.  She also has a history of palpitations and has been on Toprol-XL for this. She fractured her left humerus in 01/2023 after a mechanical fall. She was then admitted in 02/2023 for acute bilateral PE after presenting with shortness of breath. Bilateral lower extremity doppler and a left upper extremity doppler were negative for DVT. Echo during that admission showed LVEF of 60-65% with normal wall motion and mild LVH, normal RV size and function, and a small pericardial effusion. She was started on Eliquis.  She was last seen by Dr. Tresa Endo on 04/11/2023 at which time she was doing well with no recent shortness of breath or chest pain. Her Toprol-XL was increased slightly to 37.5mg  daily  due to hand tremors and heart rates.  Patient presented to the ED yesterday for nausea/ vomiting. Patient states she was in her usual state of health until 05/17/2023 when she started having nausea, vomiting, and diarrhea. She ate [place]apane[place] food the night before so she assumed it was just something she at. She had 3 episodes of vomiting that day as well as some diarrhea but then continued to have no appetite and decreased PO intake since then. She also reports nasal congestion, chest  congestion, and cough since 05/17/2023. No known sick contacts. She denies any chest pain. She has some chronic shortness of breath with exertion but she states this is not new and is stable. No orthopnea, PND, or edema. She denies any palpitations when in rapid atrial fibrillation this admission. She does report some mild dizziness with standing at times which sounds like orthostasis. However, no syncope. No abnormal bleeding including hemoptysis, hematochezia, hematuria, melena, or hematuria.  Upon arrival to the ED, EKG showed atrial fibrillation, rate 151 bpm, with RBBB and mild diffuse ST depression (likely rate related).  High-sensitivity troponin was minimally elevated and flat at 23 >> 28.  Chest x-ray showed a large hiatal hernia, mild cardiomegaly, and known COPD but no acute findings.  Abdominal/pelvic CT showed no acute abdominal or pelvic findings to account for patient's symptoms but did show extensive colonic diverticulosis without evidence of diverticulitis, and massive hiatal hernia with intrathoracic stomach similar to prior examination, and calcifications of the mitral annulus. WBC 9.0, Hgb 12.3, Plts 271. Na 138, K 3.8, Glucose 191, BUN 12, Cr 0.78. LFTs normal. Lipase normal. Magnesium low at 0.8. TSH normal. Respiratory panel negative for COVID, infleunza, and RSV. She was given 2 dose of IV Lopressor as well as PO Lopressor and converted back to sinus rhythm. However, unfortunately she went back into rapid atrial fibrillation overnight. Cardiology consulted for further evaluation.  At the time of this evaluation, patient is resting comfortably in no acute distress.  She is thankfully back in normal sinus rhythm with rates in the 90s and is wondering when she can go home.  Past Medical History:  Diagnosis Date   Burning tongue syndrome 11/11/2015   Colon polyps    Cystocele with prolapse 2019   Dr. Marice Potter- Monitor for now.    Detached retina, right    Diabetes mellitus     Diaphragmatic hernia    GERD (gastroesophageal reflux disease)    Hyperlipidemia    Insomnia    Macular degeneration     Past Surgical History:  Procedure Laterality Date   ABDOMINAL HYSTERECTOMY     BREAST CYST ASPIRATION  11/03/2020   FOOT SURGERY     OOPHORECTOMY     Rt.ovary removed with Hyst   RETINAL DETACHMENT SURGERY     TONSILLECTOMY         Inpatient Medications: Scheduled Meds:  amiodarone  150 mg Intravenous Once   apixaban  5 mg Oral BID   atorvastatin  40 mg Oral QPM   escitalopram  5 mg Oral Daily   feeding supplement  237 mL Oral BID BM   gabapentin  300 mg Oral QHS   insulin aspart  0-9 Units Subcutaneous TID WC   [START ON 05/21/2023] metoprolol succinate  50 mg Oral Daily   multivitamin with minerals  1 tablet Oral Daily   ondansetron (ZOFRAN) IV  4 mg Intravenous Once   pantoprazole  40 mg Oral Daily   Continuous Infusions:  amiodarone     Followed by   amiodarone     PRN Meds: acetaminophen, albuterol, dextromethorphan-guaiFENesin, labetalol, ondansetron **OR** ondansetron (ZOFRAN) IV, senna-docusate  Allergies:   No Known Allergies  Social History:   Social History   Socioeconomic History   Marital status: Widowed    Spouse name: Not on file   Number of children: Not on file   Years of education: Not on file   Highest education level: Not on file  Occupational History   Not on file  Tobacco Use   Smoking status: Never   Smokeless tobacco: Never  Vaping Use   Vaping status: Never Used  Substance and Sexual Activity   Alcohol use: No   Drug use: No   Sexual activity: Not Currently    Birth control/protection: Surgical    Comment: Hyst--Lt.ovary remains  Other Topics Concern   Not on file  Social History Narrative   Marital status/children/pets: Widowed.   Education/employment: 12th grade education.  Retired.   Safety:      -smoke alarm in the home:Yes     - wears seatbelt: Yes     - Feels safe in their relationships: Yes    Social Determinants of Health   Financial Resource Strain: Low Risk  (03/20/2023)   Received from [facility]   Overall Financial Resource Strain (CARDIA)    Difficulty of Paying Living Expenses: Not hard at all  Food Insecurity: No Food Insecurity (05/19/2023)   Hunger Vital Sign    Worried About Running Out of Food in the Last Year: Never true    Ran Out of Food in the Last Year: Never true  Transportation Needs: No Transportation Needs (05/19/2023)   PRAPARE - Administrator, Civil Service (Medical): No    Lack of Transportation (Non-Medical): No  Physical Activity: Insufficiently Active (03/20/2023)   Received from [facility]   Exercise Vital Sign    Days of Exercise per Week: 2 days    Minutes  of Exercise per Session: 10 min  Stress: No Stress Concern Present (03/20/2023)   Received from [facility] - Occupational Stress Questionnaire    Feeling of Stress : Not at all  Social Connections: Moderately Integrated (03/20/2023)   Received from [facility]   Social Connection and Isolation Panel [NHANES]    Frequency of Communication with Friends and Family: Not on file    Frequency of Social Gatherings with Friends and Family: Three times a week    Attends Religious Services: More than 4 times per year    Active Member of Clubs or Organizations: Yes    Attends Banker Meetings: More than 4 times per year    Marital Status: Widowed  Intimate Partner Violence: Not At Risk (05/19/2023)   Humiliation, Afraid, Rape, and Kick questionnaire    Fear of Current or Ex-Partner: No    Emotionally Abused: No    Physically Abused: No    Sexually Abused: No    Family History:   Family History  Problem Relation Age of Onset   Heart disease Other    Breast cancer Other    Breast cancer Mother        metastatic--to bone   CAD Father    Cancer Maternal Grandmother        unknown   Cancer Paternal  Grandmother        colon     ROS:  Please see the history of present illness.  Review of Systems  Constitutional:  Negative for fever.  HENT:  Positive for congestion.   Respiratory:  Positive for cough and shortness of breath. Negative for hemoptysis.   Cardiovascular:  Negative for chest pain, palpitations, orthopnea, leg swelling and PND.  Gastrointestinal:  Positive for diarrhea, nausea and vomiting. Negative for blood in stool and melena.  Genitourinary:  Negative for hematuria.  Musculoskeletal:  Negative for falls.  Neurological:  Positive for dizziness. Negative for loss of consciousness.  Psychiatric/Behavioral:  Negative for substance abuse.    Physical Exam/Data:   Vitals:   05/20/23 0912 05/20/23 1013 05/20/23 1113 05/20/23 1141  BP: 96/72 (!) 101/51 103/66 103/66  Pulse:  95 91 95  Resp:  18 16 16   Temp:    98 F (36.7 C)  TempSrc:    Oral  SpO2: 94% 93% 96% 97%  Weight:      Height:        Intake/Output Summary (Last 24 hours) at 05/20/2023 1158 Last data filed at 05/19/2023 1644 Gross per 24 hour  Intake 150 ml  Output --  Net 150 ml      05/19/2023    5:27 AM 05/09/2023   10:11 AM 04/26/2023   10:18 AM  Last 3 Weights  Weight (lbs) 123 lb 121 lb 1.6 oz 119 lb 9.6 oz  Weight (kg) 55.792 kg 54.931 kg 54.25 kg     Body mass index is 21.79 kg/m.  General: 88 y.o. Caucasian female resting comfortably in no acute distress. HEENT: Normocephalic and atraumatic. Sclera clear. Neck: Supple. No carotid bruits. No JVD. Heart: RRR. No murmurs, gallops, or rubs.  Lungs: No increased work of breathing. Scattered expiratory wheezes noted. No crackles. Abdomen: Soft, non-distended, and non-tender to palpation.  Extremities: No lower edema.    Skin: Warm and dry. Neuro: Alert and oriented x3. No focal deficits. Psych: Normal affect. Responds appropriately.   EKG:  The EKG was personally reviewed and demonstrates:  Initial  EKG on 05/19/2023 showed atrial  fibrillation, rate 151 bpm, with RBBB and mild diffuse ST depression (likely rate related). Repeat EKG on 05/19/2023 showed normal sinus rhythm, rate 75 bpm, with RBBB and resolution of ST depressions. EKG on 05/20/2023 showed atrial fibrillation, rate 155 bpm, with RBBB, diffuse ST depression and some T wave abnormalities in inferior leads.    Telemetry:  Telemetry was personally reviewed and demonstrates:  Currently in normal sinus rhythm with rates in the 90s. However, she was in atrial fibrillation for several hours earlier today with rates mostly in the 110s to 150s but as high as the 170s at times.  Relevant CV Studies:  Echocardiogram 02/27/2023: Impressions: 1. Cannot assess wall motion. Windows are limited. Left ventricular  ejection fraction, by estimation, is 60 to 65%. The left ventricle has  normal function. The left ventricle has no regional wall motion  abnormalities. There is mild left ventricular  hypertrophy. Left ventricular diastolic function could not be evaluated.   2. Right ventricular systolic function is normal. The right ventricular  size is not well visualized. There is normal pulmonary artery systolic  pressure. The estimated right ventricular systolic pressure is 27.6 mmHg.   3. A small pericardial effusion is present.   4. The mitral valve was not well visualized. No evidence of mitral valve  regurgitation.   5. Aortic valve regurgitation is not visualized.   6. The inferior vena cava is normal in size with greater than 50%  respiratory variability, suggesting right atrial pressure of 3 mmHg.    Laboratory Data:  High Sensitivity Troponin:   Recent Labs  Lab 05/19/23 0539 05/19/23 0830  TROPONINIHS 23* 28*     Chemistry Recent Labs  Lab 05/19/23 0539 05/19/23 1146 05/20/23 0331  NA 138  --  137  K 3.8  --  3.5  CL 102  --  102  CO2 23  --  27  GLUCOSE 191*  --  138*  BUN 12  --  13  CREATININE 0.78  --  0.85  CALCIUM 9.0  --  9.1  MG 0.8*  3.1* 2.3  GFRNONAA >60  --  >60  ANIONGAP 13  --  8    Recent Labs  Lab 05/19/23 0539  PROT 6.7  ALBUMIN 3.9  AST 16  ALT 8  ALKPHOS 63  BILITOT 0.6   Lipids No results for input(s): "CHOL", "TRIG", "HDL", "LABVLDL", "LDLCALC", "CHOLHDL" in the last 168 hours.  Hematology Recent Labs  Lab 05/19/23 0539 05/20/23 0331  WBC 9.0 5.8  RBC 4.10 4.13  HGB 12.3 12.0  HCT 38.1 38.8  MCV 92.9 93.9  MCH 30.0 29.1  MCHC 32.3 30.9  RDW 14.2 14.1  PLT 271 302   Thyroid  Recent Labs  Lab 05/19/23 0835  TSH 1.561    BNPNo results for input(s): "BNP", "PROBNP" in the last 168 hours.  DDimer No results for input(s): "DDIMER" in the last 168 hours.   Radiology/Studies:  CT ABDOMEN PELVIS W CONTRAST  Result Date: 05/19/2023 CLINICAL DATA:  88 year old female with history of cough, mucus production, nausea and vomiting for the past several days. EXAM: CT ABDOMEN AND PELVIS WITH CONTRAST TECHNIQUE: Multidetector CT imaging of the abdomen and pelvis was performed using the standard protocol following bolus administration of intravenous contrast. RADIATION DOSE REDUCTION: This exam was performed according to the departmental dose-optimization program which includes automated exposure control, adjustment of the mA and/or kV according to patient size and/or use of iterative reconstruction  technique. CONTRAST:  80mL OMNIPAQUE IOHEXOL 300 MG/ML  SOLN COMPARISON:  CT the abdomen and pelvis 03/20/2023. FINDINGS: Lower chest: Massive hiatal hernia with predominantly intrathoracic stomach, similar to the prior examination. Severe calcifications of the mitral annulus. Hepatobiliary: Small low-attenuation lesions in the liver, compatible with tiny cysts and/or biliary hamartomas (no imaging follow-up recommended). No other suspicious appearing hepatic lesions. No intra or extrahepatic biliary ductal dilatation. Gallbladder is unremarkable in appearance. Pancreas: No pancreatic mass. No pancreatic ductal  dilatation. No pancreatic or peripancreatic fluid collections or inflammatory changes. Spleen: Unremarkable. Adrenals/Urinary Tract: Low-attenuation lesions in both kidneys compatible with simple cysts (Bosniak class 1, no imaging follow-up recommended), largest of which measures up to 8.1 cm in the interpolar region of the left kidney. No suspicious renal lesions are noted. No hydroureteronephrosis. Urinary bladder is unremarkable in appearance. Stomach/Bowel: Intra-abdominal portion of the stomach is unremarkable. No pathologic dilatation of small bowel or colon. A few scattered colonic diverticula are noted, without surrounding inflammatory changes to indicate an acute diverticulitis at this time. The appendix is not confidently identified and may be surgically absent. Regardless, there are no inflammatory changes noted adjacent to the cecum to suggest the presence of an acute appendicitis at this time. Vascular/Lymphatic: Atherosclerosis in the abdominal aorta and pelvic vasculature. No lymphadenopathy noted in the abdomen or pelvis. Reproductive: Status post hysterectomy. Ovaries are not confidently identified may be surgically absent or atrophic. Other: No significant volume of ascites.  No pneumoperitoneum. Musculoskeletal: There are no aggressive appearing lytic or blastic lesions noted in the visualized portions of the skeleton. IMPRESSION: 1. No definite acute findings are noted in the abdomen or pelvis to account for the patient's symptoms. 2. Extensive colonic diverticulosis without evidence of acute diverticulitis at this time. 3. Massive hiatal hernia with intrathoracic stomach, similar to the prior examination. 4. There are calcifications of the mitral annulus. Echocardiographic correlation for evaluation of potential valvular dysfunction may be warranted if clinically indicated. 5. Additional incidental findings, as above. Electronically Signed   By: Trudie Reed M.D.   On: 05/19/2023 07:31    DG Chest Port 1 View  Result Date: 05/19/2023 CLINICAL DATA:  [address] with cough, nausea and vomiting. EXAM: PORTABLE CHEST 1 VIEW COMPARISON:  CTA chest 02/25/2023 FINDINGS: The lungs are mildly emphysematous but clear. There is a large hiatal hernia. Mild cardiomegaly without overt CHF. Stable mediastinum with aortic atherosclerosis. The mitral ring is heavily calcified. There is osteopenia with a partially healed proximal left humeral fracture, which previously appeared more recent. No new osseous abnormality is seen. Degenerative change thoracic spine. IMPRESSION: 1. No evidence of acute chest disease.  Stable COPD chest. 2. Large hiatal hernia. 3. Mild cardiomegaly. 4. Aortic atherosclerosis. 5. Partially healed proximal left humeral fracture. Electronically Signed   By: Almira Bar M.D.   On: 05/19/2023 06:15     Assessment and Plan:   New Onset Atrial Fibrillation Patient presented with nausea/ vomiting, diarrhea, and nasal/ chest congestion and was noted to be in atrial fibrillation with RVR. Rates as high as the 170s to 180s. She was given 2 doses of IV Lopressor and 1 dose of PO Lopressor and converted back to normal sinus rhythm. Unfortunately, she went back into atrial fibrillation earlier this morning but has since converted back to normal rhythm.  - Currently in normal sinus rhythm with rates in the 90s.  - Mangesium was 0.8 on arrival. Repleted and 2.3 today. - Potassium 3.5 today. Repleted. - TSH normal. - LVEF normal on  Echo in 02/2023. Repeat limited Echo has been ordered. Agree with this reassess LV function. - Patient takes Toprol-XL 37.5mg  daily at home.Will switch to Lopressor 25mg  twice daily. BP is soft at times so will place hold parameters.  - Orders for IV Amiodarone bolus and drip were placed when patietn was in rapid atrial fibrillation. However, can hold off on this now that she is back in normal sinus rhythm. If she has recurrent atrial fibrillation, may need to  start this given soft BP. - CHA2DS2-VASc = 6 (HTN, DM, aortic atheroscloeris noted on prior CT, age x2, gender). She is already on Eliquis 5mg  twice daily given recent PE in 02/2023. From an atrial fibrillation standpoint, patient meets criteria for reduced dose of 2.5mg  twice daily given age and weight. Will defer how long he needs treatment dose for PE to primary team/ PCP but once she completes this would decrease to 2.5mg  twice daily.  Demand Ischemia High-sensitivity troponin minimally elevated and flat at 23 >> 28. EKG when in rapid atrial fibrillation did show some diffuse ST depressions but suspect this was rate related. Resolved when back in sinus rhythm. - No chest pain.  - Troponin elevation consistent with demand ischemia in setting of rapid atrial fibrillation. No ischemic evaluation necessary at this time.  History of Bilateral PE Diagnosed in 02/2023.  - Currently on Eliquis 5mg  twice daily.  COPD Patient denies any history of COPD but there is evidence of this on chest x-ray.  - Scattered expiratory wheezing noted on exam. - Continue PRN albuterol inhalers per primary team.  Nausea/ Vomiting/ Diarrhea Poor Appetite LFTs and lipase normal. Abdominal/ pelvic CT showed diverticulosis and large hiatal hernia but no acute findings to explain symptoms.  - Feeling better today. Actually able to eat. - Management per primary team.  Otherwise, per primary team: - Large hiatal hernia - GERD - Type 2 diabetes mellitus - Depression - Hyperlipidemia  Risk Assessment/Risk Scores:   CHA2DS2-VASc Score = 6  This indicates a 9.7% annual risk of stroke. The patient's score is based upon: CHF History: 0 HTN History: 1 Diabetes History: 1 Stroke History: 0 Vascular Disease History: 1 Age Score: 2 Gender Score: 1   For questions or updates, please contact [facility] Please consult [email or web address] for contact info under    Signed, Corrin Parker, PA-C  05/20/2023  11:58 AM  History and all data above reviewed.  Patient examined.  I agree with the findings as above.  The patient presented with nausea.  This happens to her not infrequently.  She was severe enough that she wanted to come to an urgent care yesterday and presented and was noted to be in atrial fibrillation.  She really would not notice her fibrillation although she knows she had a rapid heart rate.  She says occasionally she does that she gets tired and her heart is going fast just go and sit down.  She does not report any presyncope or syncope.  She has been on chronic anticoagulation because of her PEs.  The patient exam reveals COR:RRR  ,  Lungs: Clear  ,  Abd: Positive bowel sounds, no rebound no guarding, Ext No edema  .  All available labs, radiology testing, previous records reviewed. Agree with documented assessment and plan.   Atrial fib: She has paroxysmal atrial fibrillation.  She is already on anticoagulation and she will continue this.  We will try to increase her beta-blocker although she has some orthostatic symptoms.  We  will have to be very ginger about this.  I spent a long time talking to her about fall risk and in particular avoiding any rapid changes in position and being aware of symptoms that could be presyncope.  I talked with her and her family members about getting pulse oximeter to follow her heart rate.  We will have her wear a 2-week event monitor when she is discharged to see if we can quantify the burden of her atrial fibrillation.  Is not entirely clear that this was not related to the episode of nausea and electrolyte disturbance but I suspect she is having paroxysms.   She probably needs long-term prophylactic Eliquis even after she has completed her course of therapy for her pulmonary embolism.    Fayrene Fearing Damien Cisar  12:21 PM  05/20/2023

## 2023-05-20 NOTE — Progress Notes (Signed)
After going to the restroom, patient went into Afib with RVR. Confirmed by EKG. Howerter MD notified. See MAR for new orders

## 2023-05-20 NOTE — Hospital Course (Signed)
88 y.o. female with medical history significant of PE on Eliquis, T2DM, GERD, depression, HLD and HTN who presented to [facility] for evaluation of nausea, poor appetite, palpitations and chest discomfort. Patient states that she ate at a [employer] Tuesday evening and since Tuesday night, she has felt unwell. She reports having nausea and no appetite. She had 1 episode of emesis 2 days ago and since then has had decreased p.o. intake. She has not taken most of her meds except her Eliquis for the last 2 days because she is afraid to take them on an empty stomach.  She reports a history of palpitations but not recently.  She endorsed mild cough but denies any fevers, chills, chest pain, abdominal pain, headaches or dizziness.

## 2023-05-20 NOTE — Progress Notes (Signed)
Initial Nutrition Assessment  DOCUMENTATION CODES:   Not applicable  INTERVENTION:  Continue current diet as ordered Encourage PO intake Continue Ensure Enlive po BID, each supplement provides 350 kcal and 20 grams of protein. Add chocolate to flavor preference Continue MVI  NUTRITION DIAGNOSIS:   Inadequate oral intake related to vomiting, nausea as evidenced by per patient/family report.  GOAL:   Patient will meet greater than or equal to 90% of their needs  MONITOR:   PO intake, Supplement acceptance, Labs  REASON FOR ASSESSMENT:   Consult Assessment of nutrition requirement/status  ASSESSMENT:   Pt with hx of HTN, GERD, DM type 2, and HTN presented to ED with N/V/D, poor PO, and productive cough. Reports has not been taking her prescribed medications at home as she has been unable to eat. Found to be tachycardic in ED and in atrial fibrillation.  RD working remotely.  Called room and was able to speak to pt's niece over the phone. States that last night pt only ate a few bites of her food but that she had french toast for breakfast and they just called down her lunch. Seems to be getting her appetite back. Has been drinking colas and gingerale this AM.  Family reports that at home, pt generally has a good appetite. Pt lives alone but family goes over to check on her during the day to make sure that she has eaten. States that pt doesn't like to drink much water, gave tips for increasing fluids and recommended having packets like crystal light available for pt to utilize.   Pt does like ensure - prefers chocolate. Will add flavor preferences.   Admit / Current weight: 55.8 kg  Stable weight noted PTA  Average Meal Intake: 11/29: 25% intake x 1 recorded meals  Nutritionally Relevant Medications: Scheduled Meds:  atorvastatin  40 mg QPM   Ensure Plus High Protein  237 mL BID BM   insulin aspart  0-9 Units TID WC   multivitamin with minerals  1 tablet Daily    ondansetron (ZOFRAN) IV  4 mg Once   pantoprazole  40 mg Daily   PRN Meds: ondansetron, senna-docusate  Labs Reviewed: Phosphorus 4.7 CBG ranges from 100-220 mg/dL over the last 24 hours HgbA1c 6.3% (11/6)  NUTRITION - FOCUSED PHYSICAL EXAM: Defer to in-person assessment  Diet Order:   Diet Order             Diet Carb Modified Fluid consistency: Thin; Room service appropriate? Yes  Diet effective now                   EDUCATION NEEDS:   Education needs have been addressed  Skin:  Skin Assessment: Reviewed RN Assessment  Last BM:  PTA on 11/27  Height:   Ht Readings from Last 1 Encounters:  05/19/23 5\' 3"  (1.6 m)    Weight:   Wt Readings from Last 1 Encounters:  05/19/23 55.8 kg    Ideal Body Weight:  52.3 kg  BMI:  Body mass index is 21.79 kg/m.  Estimated Nutritional Needs:   Kcal:  1300-1500 kcal/d  Protein:  60-75g/d  Fluid:  >1.5L/d    Greig Castilla, RD, LDN Registered Dietitian II RD pager # available in [facility]  After hours/weekend pager # available in [facility]

## 2023-05-20 NOTE — Progress Notes (Signed)
Progress Note   Patient: Melissa [name] WGN:[id number] DOB: 1935-05-29 DOA: 05/19/2023     1 DOS: the patient was seen and examined on 05/20/2023   Brief hospital course: 88 y.o. female with medical history significant of PE on Eliquis, T2DM, GERD, depression, HLD and HTN who presented to [facility] for evaluation of nausea, poor appetite, palpitations and chest discomfort. Patient states that she ate at a [employer] Tuesday evening and since Tuesday night, she has felt unwell. She reports having nausea and no appetite. She had 1 episode of emesis 2 days ago and since then has had decreased p.o. intake. She has not taken most of her meds except her Eliquis for the last 2 days because she is afraid to take them on an empty stomach.  She reports a history of palpitations but not recently.  She endorsed mild cough but denies any fevers, chills, chest pain, abdominal pain, headaches or dizziness.   Assessment and Plan: # New onset A-fib, resolved Patient with history of tachycardia and PE on Eliquis presenting for evaluation after not feeling well for the past 3 days found to have new onset A-fib with RVR.  -continued afib RVR to the 140's this AM -Pt continued Toprol-XL 37.5 mg daily, later increased to 50mg  daily -BP soft, thus amiodarone gtt was ordered and Cardiology consulted. However, prior to initiating amiodarone, pt converted to sinus -Cardiology has recommended increasing BB with 2 week event monitor after discharge to quantify burden of her afib   # Hypomagnesemia, resolved Patient found to have low magnesium of 0.8 on admission. Received a total of 6 g of IV mag with improvement to 3.1. Normal K+ and phosphate. -cont to replace as needed   # Generalized weakness # Poor appetite, nausea Reports feeling overall weak but no focal weakness. She has had decreased p.o. intake over the last 2 days due to nausea and low appetite but denies any fatigue.  CT A/P shows diverticulosis  but no acute intra-abdominal pathology.  -Ensure twice daily between meals -As needed Zofran for nausea vomiting -PT/OT eval   # T2DM A1c 6.3% 3 weeks ago. Blood sugar of 191 on CMP. -SSI with sliding scale, CBG monitoring -Resume home metformin at discharge   # COPD -As needed albuterol for cough or wheeze   #Hx of PE -Eliquis 5 mg twice daily   # HLD -Atorvastatin 40 mg daily   # Large hiatal hernia # GERD -Protonix 40 mg daily   # Depression -Lexapro 5 mg daily      Subjective: Without complaints this AM. Denies chest pains  Physical Exam: Vitals:   05/20/23 1013 05/20/23 1113 05/20/23 1141 05/20/23 1544  BP: (!) 101/51 103/66 103/66 (!) 102/58  Pulse: 95 91 95 91  Resp: 18 16 16 16   Temp:   98 F (36.7 C) 98.1 F (36.7 C)  TempSrc:   Oral Oral  SpO2: 93% 96% 97% 92%  Weight:      Height:       General exam: Awake, laying in bed, in nad Respiratory system: Normal respiratory effort, no wheezing Cardiovascular system: tachycardic, s1, s2 Gastrointestinal system: Soft, nondistended, positive BS Central nervous system: CN2-12 grossly intact, strength intact Extremities: Perfused, no clubbing Skin: Normal skin turgor, no notable skin lesions seen Psychiatry: Mood normal // no visual hallucinations   Data Reviewed:  Labs reviewed: Na 137, K 3.5, Cr 0.85, WBC 5.8, Hgb 12.0, Plts 302  Family Communication: Pt in room, family not at bedside  Disposition: Status is: Observation The patient remains OBS appropriate and will d/c before 2 midnights.  Planned Discharge Destination: Home     Author: Rickey Barbara, MD 05/20/2023 4:56 PM  For on call review [email or web address].

## 2023-05-20 NOTE — Progress Notes (Signed)
OT Evaluation  Clinical Impression: Pt is typically independent in ADL and mobility. She lives alone and drives within her hometown of [place] for groceries. Today she presents very close to baseline as she demonstrated bed mobility, in room mobility without DME, toilet transfer, peri care, LB dressing and clothing management. She states that she has a life alert but does not use it. Recommended that she wear and use it, mild balance deficits observed - will defer to PT recommendations. Daughter present throughout session and very supportive. OT will follow acutely and at Pt request no post-acute OT recommended at this time.     05/20/23 1540  OT Visit Information  Last OT Received On 05/20/23  Assistance Needed +1  History of Present Illness Melissa [name] is a 88 y.o. female presented 05/19/23 with nausea, poor appetite, palpitations and chest discomfort. PMH includes PE on Eliquis, T2DM, GERD, depression, HLD and HTN, shoulder fx 8/24, macular degeneration, burning tongue syndrome.  Precautions  Precautions Fall  Precaution Comments watch HR  Restrictions  Weight Bearing Restrictions No  Home Living  Family/patient expects to be discharged to: Private residence  Living Arrangements Alone  Available Help at Discharge Family;Available PRN/intermittently  Type of Home House  Home Access Ramped entrance  Home Layout One level  Bathroom Shower/Tub Tub/shower unit;Walk-in Pension scheme manager No  Home Equipment BSC/3in1;Cane - single point  Additional Comments has life alert but does not use, family has been trying to help her make smarter choices (no going down into basement to do laundry, not driving outside of [place], wearing her lifealert)  Prior Function  Prior Level of Function  Driving;Independent/Modified Independent  Pain Assessment  Pain Assessment No/denies pain  Cognition  Arousal Alert  Behavior During Therapy WFL for tasks  assessed/performed  Overall Cognitive Status Impaired/Different from baseline  Area of Impairment Safety/judgement;Awareness  Safety/Judgement Decreased awareness of safety;Decreased awareness of deficits  Awareness Emergent  General Comments Pt overall is very cooperative and pleasant, however despite having items/DME/tools to maximize safety and independence she does not often use them  Communication  Communication Hearing impairment  Cueing Techniques Verbal cues;Gestural cues  Upper Extremity Assessment  Upper Extremity Assessment Generalized weakness  Lower Extremity Assessment  Lower Extremity Assessment Defer to PT evaluation  Vision- History  Baseline Vision/History 1 Wears glasses  Ability to See in Adequate Light 0 Adequate  Patient Visual Report No change from baseline  Vision- Assessment  Vision Assessment? No apparent visual deficits  ADL  Overall ADL's  At baseline  General ADL Comments Pt able to demonstrate LB dressing, toilet transfer, peri care, sink level grooming, with no noticeable LOB - but unsteady. She reports that she does do her own cooking/cleaning and even blowing her leaves.  Bed Mobility  Overal bed mobility Needs Assistance  Bed Mobility Supine to Sit  Supine to sit Supervision  General bed mobility comments line management  Transfers  Overall transfer level Needs assistance  Equipment used None  Transfers Sit to/from Stand  Sit to Stand Contact guard assist  General transfer comment unsteady, requiring CGA for safety  Balance  Overall balance assessment Mild deficits observed, not formally tested  General Comments  General comments (skin integrity, edema, etc.) daughter present throughout session. Reporting concern about driving into town  OT - End of Session  Activity Tolerance Patient tolerated treatment well  Patient left Other (comment) (walking in hallway with PT)  Nurse Communication Mobility status  OT Assessment  OT  Recommendation/Assessment Patient does not need any further OT services;Patient needs continued OT Services  OT Visit Diagnosis Other abnormalities of gait and mobility (R26.89);History of falling (Z91.81)  OT Problem List Impaired balance (sitting and/or standing);Decreased safety awareness  OT Plan  OT Frequency (ACUTE ONLY) Min 1X/week  AM-PAC OT "6 Clicks" Daily Activity Outcome Measure (Version 2)  Help from another person eating meals? 4  Help from another person taking care of personal grooming? 4  Help from another person toileting, which includes using toliet, bedpan, or urinal? 3  Help from another person bathing (including washing, rinsing, drying)? 3  Help from another person to put on and taking off regular upper body clothing? 4  Help from another person to put on and taking off regular lower body clothing? 4  6 Click Score 22  Progressive Mobility  What is the highest level of mobility based on the progressive mobility assessment? Level 5 (Walks with assist in room/hall) - Balance while stepping forward/back and can walk in room with assist - Complete  Mobility Referral Yes  Activity Ambulated with assistance in room  OT Recommendation  Recommendations for Other Services PT consult  Follow Up Recommendations No OT follow up  Patient can return home with the following Assistance with cooking/housework;Assist for transportation  Functional Status Assessent Patient has had a recent decline in their functional status and demonstrates the ability to make significant improvements in function in a reasonable and predictable amount of time.  OT Equipment None recommended by OT (Pt has appropriate DME)  Individuals Consulted  Consulted and Agree with Results and Recommendations Patient;Family member/caregiver  Family Member Consulted Daughter  Acute Rehab OT Goals  Patient Stated Goal keep as independent as possible  OT Goal Formulation With patient/family  Time For Goal  Achievement 06/03/23  Potential to Achieve Goals Good  OT Time Calculation  OT Start Time (ACUTE ONLY) 1455  OT Stop Time (ACUTE ONLY) 1513  OT Time Calculation (min) 18 min  OT General Charges  $OT Visit 1 Visit  OT Evaluation  $OT Eval Low Complexity 1 Low    Nyoka Cowden OTR/L Acute Rehabilitation Services Office: [phone]

## 2023-05-20 NOTE — Progress Notes (Addendum)
TRH night cross cover note:   I was notified by RN that this patient has gone back into atrial fibrillation with RVR, with sustained rates in the 120s to 140s.  She was admitted yesterday for new onset atrial fibrillation with RVR, which had spontaneously converted to normal sinus rhythm, before converting back into atrial fibrillation this evening.  She feels more short of breath with a conversion back to atrial fibrillation, but otherwise denies any symptoms, including no associated chest pain.  Her vital signs otherwise appears stable, including afebrile, most recent blood pressure 137/80; respiratory rate 20, oxygen saturation in the mid 90s on 2 L nasal cannula.  Her admitting labs included hypomagnesemia (mag level 0.9), for which she has undergone supplementation with IV magnesium sulfate, with ensuing improvement to 3.1.  Updated magnesium level is pending for this morning's labs.  BMP also pending this morning.  She has undergone updated EKG this morning, which shows atrial fibrillation with RVR, with right bundle branch block, heart rate 127, nonspecific T wave inversion in leads III as well as V1 through V3, of which T wave version in V1 was also noted to be present per most recent prior EKG showing sinus rhythm on the morning of 05/19/2023, with current EKG also showing nonspecific less than 1 mm ST depression in V1 and V2, felt to be rate related, without any evidence of ST elevation.  She is currently on metoprolol succinate 37.5 mg p.o. daily, with next dose to occur this morning.  Prior to this new diagnosis of atrial fibrillation, she was already anticoagulated on Eliquis for history of pulmonary embolism.  This has been continued.  I have asked that her morning dose of metoprolol succinate be given now, and I have also added prn IV Lopressor for sustained heart rates greater than 130.  Following for result of this morning's magnesium level as well as BMP result.   Update: morning  mag level is 2.3. morning potassium level is 3.5. I subsequently ordered kcl 40 meq po x 1 dose now.     Newton Pigg, DO Hospitalist

## 2023-05-20 NOTE — Evaluation (Signed)
Physical Therapy Evaluation Patient Details Name: Melissa [name] MRN: [id number] DOB: 1934/12/09 Today's Date: 05/20/2023  History of Present Illness  Melissa [name] is a 88 y.o. female presented 05/19/23 with nausea, poor appetite, palpitations and chest discomfort. PMH includes PE on Eliquis, T2DM, GERD, depression, HLD and HTN, shoulder fx 8/24, macular degeneration, burning tongue syndrome.  Clinical Impression  Pt admitted with/for the above dx/event.  Pt not at baseline needing cga for mild instability and gait deviation.  Pt currently limited functionally due to the problems listed below.  (see problems list.)  Pt will benefit from PT to maximize function and safety to be able to get home safely with available assist.         If plan is discharge home, recommend the following:     Can travel by private vehicle        Equipment Recommendations None recommended by PT  Recommendations for Other Services       Functional Status Assessment Patient has had a recent decline in their functional status and demonstrates the ability to make significant improvements in function in a reasonable and predictable amount of time.     Precautions / Restrictions Precautions Precautions: Fall Precaution Comments: watch HR Restrictions Weight Bearing Restrictions: No      Mobility  Bed Mobility Overal bed mobility: Needs Assistance Bed Mobility: Supine to Sit     Supine to sit: Supervision     General bed mobility comments: line management    Transfers Overall transfer level: Needs assistance   Transfers: Sit to/from Stand Sit to Stand: Contact guard assist           General transfer comment: cues for hand placement and positioning, no assist    Ambulation/Gait Ambulation/Gait assistance: Contact guard assist (1-2 episodes of brief light min for stability) Gait Distance (Feet): 180 Feet Assistive device: None Gait Pattern/deviations: Step-through pattern   Gait  velocity interpretation: 1.31 - 2.62 ft/sec, indicative of limited community ambulator   General Gait Details: mild instability with mild drift and scissor initially with some improvement overall, but still continued drift.  Related to pt and dtr that use of a cane outside or during errands would be beneficial and safer.  Stairs Stairs: Yes Stairs assistance: Contact guard assist Stair Management: One rail Right, Alternating pattern, Forwards Number of Stairs: 5 General stair comments: safe with the rails.  Wheelchair Mobility     Tilt Bed    Modified Rankin (Stroke Patients Only)       Balance Overall balance assessment: Needs assistance Sitting-balance support: No upper extremity supported, Feet supported Sitting balance-Leahy Scale: Good     Standing balance support: No upper extremity supported, During functional activity Standing balance-Leahy Scale: Fair                               Pertinent Vitals/Pain Pain Assessment Pain Assessment: No/denies pain    Home Living Family/patient expects to be discharged to:: Private residence Living Arrangements: Alone Available Help at Discharge: Family;Available PRN/intermittently Type of Home: House Home Access: Ramped entrance       Home Layout: One level Home Equipment: BSC/3in1;Cane - single point Additional Comments: has life alert but does not use    Prior Function Prior Level of Function : Driving;Independent/Modified Independent                     Extremity/Trunk Assessment   Upper Extremity Assessment  Upper Extremity Assessment: Defer to OT evaluation;Overall [facility] for tasks assessed    Lower Extremity Assessment Lower Extremity Assessment: Overall WFL for tasks assessed (general proximal weaknesses)    Cervical / Trunk Assessment Cervical / Trunk Assessment: Normal  Communication   Communication Communication: Hearing impairment Cueing Techniques: Verbal cues  Cognition  Arousal: Alert Behavior During Therapy: WFL for tasks assessed/performed Overall Cognitive Status: Impaired/Different from baseline Area of Impairment: Safety/judgement, Awareness                         Safety/Judgement: Decreased awareness of safety, Decreased awareness of deficits Awareness: Emergent   General Comments: Pt overall is very cooperative and pleasant, however despite having items/DME/tools to maximize safety and independence she does not often use them        General Comments General comments (skin integrity, edema, etc.): Discussed with pt and dtr. among other things, it would be safest and allow Independence at home for a longer period if pt would use a SPC at least outside and a life alert on her wrist.    Exercises     Assessment/Plan    PT Assessment Patient needs continued PT services  PT Problem List Decreased strength;Decreased activity tolerance;Decreased balance;Decreased mobility;Decreased safety awareness       PT Treatment Interventions DME instruction;Gait training;Stair training;Functional mobility training;Therapeutic activities;Balance training;Patient/family education    PT Goals (Current goals can be found in the Care Plan section)  Acute Rehab PT Goals Patient Stated Goal: home and continue to be independent PT Goal Formulation: With patient Time For Goal Achievement: 05/26/23 Potential to Achieve Goals: Good    Frequency Min 1X/week     Co-evaluation               AM-PAC PT "6 Clicks" Mobility  Outcome Measure Help needed turning from your back to your side while in a flat bed without using bedrails?: A Little Help needed moving from lying on your back to sitting on the side of a flat bed without using bedrails?: A Little Help needed moving to and from a bed to a chair (including a wheelchair)?: A Little Help needed standing up from a chair using your arms (e.g., wheelchair or bedside chair)?: A Little Help needed to  walk in hospital room?: A Little Help needed climbing 3-5 steps with a railing? : A Little 6 Click Score: 18    End of Session   Activity Tolerance: Patient tolerated treatment well Patient left: in chair;with call bell/phone within reach;with family/visitor present Nurse Communication: Mobility status PT Visit Diagnosis: Other abnormalities of gait and mobility (R26.89);Unsteadiness on feet (R26.81)    Time: 1610-9604 PT Time Calculation (min) (ACUTE ONLY): 22 min   Charges:   PT Evaluation $PT Eval Moderate Complexity: 1 Mod   PT General Charges $$ ACUTE PT VISIT: 1 Visit         05/20/2023  Jacinto Halim., PT Acute Rehabilitation Services [phone]  (office)  Eliseo Gum Brigett Estell 05/20/2023, 5:11 PM

## 2023-05-20 NOTE — Progress Notes (Signed)
  Echocardiogram 2D Echocardiogram has been performed.  Melissa [name] 05/20/2023, 2:11 PM

## 2023-05-20 NOTE — Plan of Care (Signed)
  Problem: Education: Goal: Knowledge of General Education information will improve Description: Including pain rating scale, medication(s)/side effects and non-pharmacologic comfort measures Outcome: Progressing   Problem: Health Behavior/Discharge Planning: Goal: Ability to manage health-related needs will improve Outcome: Progressing   Problem: Clinical Measurements: Goal: Ability to maintain clinical measurements within normal limits will improve Outcome: Progressing Goal: Will remain free from infection Outcome: Progressing Goal: Diagnostic test results will improve Outcome: Progressing Note: Pt is back in  NSR Goal: Respiratory complications will improve Outcome: Progressing Note: Pt is currently on RA Goal: Cardiovascular complication will be avoided Outcome: Progressing   Problem: Activity: Goal: Risk for activity intolerance will decrease Outcome: Progressing   Problem: Nutrition: Goal: Adequate nutrition will be maintained Outcome: Progressing   Problem: Coping: Goal: Level of anxiety will decrease Outcome: Progressing   Problem: Elimination: Goal: Will not experience complications related to urinary retention Outcome: Progressing   Problem: Pain Management: Goal: General experience of comfort will improve Outcome: Progressing   Problem: Safety: Goal: Ability to remain free from injury will improve Outcome: Progressing   Problem: Skin Integrity: Goal: Risk for impaired skin integrity will decrease Outcome: Progressing   Problem: Education: Goal: Knowledge of disease or condition will improve Outcome: Progressing Goal: Understanding of medication regimen will improve Outcome: Progressing   Problem: Activity: Goal: Ability to tolerate increased activity will improve Outcome: Progressing   Problem: Cardiac: Goal: Ability to achieve and maintain adequate cardiopulmonary perfusion will improve Outcome: Progressing   Problem: Health  Behavior/Discharge Planning: Goal: Ability to safely manage health-related needs after discharge will improve Outcome: Progressing   Problem: Coping: Goal: Ability to adjust to condition or change in health will improve Outcome: Progressing   Problem: Fluid Volume: Goal: Ability to maintain a balanced intake and output will improve Outcome: Progressing   Problem: Health Behavior/Discharge Planning: Goal: Ability to identify and utilize available resources and services will improve Outcome: Progressing Goal: Ability to manage health-related needs will improve Outcome: Progressing   Problem: Skin Integrity: Goal: Risk for impaired skin integrity will decrease Outcome: Progressing   Problem: Tissue Perfusion: Goal: Adequacy of tissue perfusion will improve Outcome: Progressing

## 2023-05-21 ENCOUNTER — Other Ambulatory Visit: Payer: Self-pay | Admitting: Student

## 2023-05-21 DIAGNOSIS — I4891 Unspecified atrial fibrillation: Secondary | ICD-10-CM | POA: Diagnosis not present

## 2023-05-21 DIAGNOSIS — I10 Essential (primary) hypertension: Secondary | ICD-10-CM | POA: Diagnosis not present

## 2023-05-21 DIAGNOSIS — I480 Paroxysmal atrial fibrillation: Secondary | ICD-10-CM

## 2023-05-21 HISTORY — DX: Paroxysmal atrial fibrillation: I48.0

## 2023-05-21 LAB — CBC
HCT: 36.6 % (ref 36.0–46.0)
Hemoglobin: 11.4 g/dL — ABNORMAL LOW (ref 12.0–15.0)
MCH: 29.2 pg (ref 26.0–34.0)
MCHC: 31.1 g/dL (ref 30.0–36.0)
MCV: 93.8 fL (ref 80.0–100.0)
Platelets: 294 10*3/uL (ref 150–400)
RBC: 3.9 MIL/uL (ref 3.87–5.11)
RDW: 13.9 % (ref 11.5–15.5)
WBC: 5.2 10*3/uL (ref 4.0–10.5)
nRBC: 0 % (ref 0.0–0.2)

## 2023-05-21 LAB — COMPREHENSIVE METABOLIC PANEL
ALT: 12 U/L (ref 0–44)
AST: 17 U/L (ref 15–41)
Albumin: 3.1 g/dL — ABNORMAL LOW (ref 3.5–5.0)
Alkaline Phosphatase: 63 U/L (ref 38–126)
Anion gap: 5 (ref 5–15)
BUN: 23 mg/dL (ref 8–23)
CO2: 28 mmol/L (ref 22–32)
Calcium: 9.4 mg/dL (ref 8.9–10.3)
Chloride: 104 mmol/L (ref 98–111)
Creatinine, Ser: 1.03 mg/dL — ABNORMAL HIGH (ref 0.44–1.00)
GFR, Estimated: 53 mL/min — ABNORMAL LOW (ref >60)
Glucose, Bld: 130 mg/dL — ABNORMAL HIGH (ref 70–99)
Potassium: 4.7 mmol/L (ref 3.5–5.1)
Sodium: 137 mmol/L (ref 135–145)
Total Bilirubin: 0.5 mg/dL (ref <1.2)
Total Protein: 6.3 g/dL — ABNORMAL LOW (ref 6.5–8.1)

## 2023-05-21 LAB — MAGNESIUM: Magnesium: 1.8 mg/dL (ref 1.7–2.4)

## 2023-05-21 LAB — GLUCOSE, CAPILLARY
Glucose-Capillary: 135 mg/dL — ABNORMAL HIGH (ref 70–99)
Glucose-Capillary: 112 mg/dL — ABNORMAL HIGH (ref 70–99)

## 2023-05-21 MED ORDER — METOPROLOL TARTRATE 25 MG PO TABS: 25.0000 mg | ORAL_TABLET | Freq: Two times a day (BID) | ORAL | 0 refills | Status: DC

## 2023-05-21 MED ORDER — OFF THE BEAT BOOK
Freq: Once | Status: AC
  Filled 2023-05-21: qty 1

## 2023-05-21 MED ORDER — ALBUTEROL SULFATE HFA 108 (90 BASE) MCG/ACT IN AERS: 2.0000 | INHALATION_SPRAY | RESPIRATORY_TRACT | 1 refills | Status: DC | PRN

## 2023-05-21 MED ORDER — PREDNISONE 10 MG PO TABS: ORAL_TABLET | ORAL | 0 refills | Status: AC

## 2023-05-21 NOTE — TOC Transition Note (Addendum)
Transition of Care [facility]) - CM/SW Discharge Note   Patient Details  Name: Melissa [name] MRN: [id number] Date of Birth: Jan 17, 1935  [facility]) CM/SW Contact:  Ronny Bacon, RN Phone Number: 05/21/2023, 2:43 PM   Clinical Narrative:   Secure message received from provider that patient has recommendations for outpatient PT. Referral # [id number], placed for [facility] for outpatient PT. Contact information placed on AVS.  Nurse reached out that patient unable to travel to [facility] for outpatient rehab. Spoke with daughter by phone, reports [facility] [place] mins away from patient home. Outpatient referral for [facility] clinic # [id number], placed and contact information on AVS.     Final next level of care: Home/Self Care Barriers to Discharge: No Barriers Identified   Patient Goals and CMS Choice      Discharge Placement                         Discharge Plan and Services Additional resources added to the After Visit Summary for                                       Social Determinants of Health (SDOH) Interventions SDOH Screenings   Food Insecurity: No Food Insecurity (05/19/2023)  Housing: Low Risk  (05/19/2023)  Transportation Needs: No Transportation Needs (05/19/2023)  Utilities: Not At Risk (05/19/2023)  Alcohol Screen: Low Risk  (08/26/2020)  Depression (PHQ2-9): Low Risk  (05/09/2023)  Financial Resource Strain: Low Risk  (03/20/2023)   Received from [facility]  Physical Activity: Insufficiently Active (03/20/2023)   Received from [facility]  Social Connections: Moderately Integrated (03/20/2023)   Received from [facility]  Stress: No Stress Concern Present (03/20/2023)   Received from [facility]  Tobacco Use: Low Risk  (05/19/2023)  Recent Concern: Tobacco Use - Medium Risk (03/21/2023)   Received from [facility] Literacy: Low Risk  (03/20/2023)   Received from [facility]      Readmission Risk Interventions     No data to display

## 2023-05-21 NOTE — Progress Notes (Signed)
Ordered 2 week Zio monitor for further evaluation of atrial fibrillation burden at the request of Dr. Antoine Poche. Please see his consult note from 05/20/2023 and rounding note from 05/21/2023 for more information.  Corrin Parker, PA-C 05/21/2023 1:18 PM

## 2023-05-21 NOTE — Progress Notes (Signed)
Progress Note  Patient Name: Melissa [name] Date of Encounter: 05/21/2023  Primary Cardiologist:   Nicki Guadalajara, MD   Subjective   Breathing better and wants to go home.  No pain.   Inpatient Medications    Scheduled Meds:  amiodarone  150 mg Intravenous Once   apixaban  5 mg Oral BID   atorvastatin  40 mg Oral QPM   escitalopram  5 mg Oral Daily   feeding supplement  237 mL Oral BID BM   gabapentin  300 mg Oral QHS   insulin aspart  0-9 Units Subcutaneous TID WC   metoprolol tartrate  25 mg Oral BID   multivitamin with minerals  1 tablet Oral Daily   ondansetron (ZOFRAN) IV  4 mg Intravenous Once   pantoprazole  40 mg Oral Daily   Continuous Infusions:  amiodarone     PRN Meds: acetaminophen, albuterol, dextromethorphan-guaiFENesin, labetalol, ondansetron **OR** ondansetron (ZOFRAN) IV, senna-docusate   Vital Signs    Vitals:   05/21/23 0752 05/21/23 0811 05/21/23 0945 05/21/23 1224  BP:  124/72  104/60  Pulse: 72 78 86   Resp:  18    Temp:  97.8 F (36.6 C)  98.2 F (36.8 C)  TempSrc:    Oral  SpO2: 97% 96%  96%  Weight:      Height:        Intake/Output Summary (Last 24 hours) at 05/21/2023 1301 Last data filed at 05/20/2023 1315 Gross per 24 hour  Intake 237 ml  Output --  Net 237 ml   Filed Weights   05/19/23 0527  Weight: 55.8 kg    Telemetry    NSR - Personally Reviewed  ECG    NA - Personally Reviewed  Physical Exam   GEN: No acute distress.   Neck: No  JVD Cardiac: RRR, no murmurs, rubs, or gallops.  Respiratory: Clear  to auscultation bilaterally. GI: Soft, nontender, non-distended  MS: No  edema; No deformity. Neuro:  Nonfocal  Psych: Normal affect   Labs    Chemistry Recent Labs  Lab 05/19/23 0539 05/20/23 0331 05/21/23 0442  NA 138 137 137  K 3.8 3.5 4.7  CL 102 102 104  CO2 23 27 28   GLUCOSE 191* 138* 130*  BUN 12 13 23   CREATININE 0.78 0.85 1.03*  CALCIUM 9.0 9.1 9.4  PROT 6.7  --  6.3*  ALBUMIN 3.9   --  3.1*  AST 16  --  17  ALT 8  --  12  ALKPHOS 63  --  63  BILITOT 0.6  --  0.5  GFRNONAA >60 >60 53*  ANIONGAP 13 8 5      Hematology Recent Labs  Lab 05/19/23 0539 05/20/23 0331 05/21/23 0442  WBC 9.0 5.8 5.2  RBC 4.10 4.13 3.90  HGB 12.3 12.0 11.4*  HCT 38.1 38.8 36.6  MCV 92.9 93.9 93.8  MCH 30.0 29.1 29.2  MCHC 32.3 30.9 31.1  RDW 14.2 14.1 13.9  PLT 271 302 294    Cardiac EnzymesNo results for input(s): "TROPONINI" in the last 168 hours. No results for input(s): "TROPIPOC" in the last 168 hours.   BNPNo results for input(s): "BNP", "PROBNP" in the last 168 hours.   DDimer No results for input(s): "DDIMER" in the last 168 hours.   Radiology    ECHOCARDIOGRAM LIMITED  Result Date: 05/20/2023    ECHOCARDIOGRAM LIMITED REPORT   Patient Name:   Melissa [name] Date of Exam: 05/20/2023 Medical Rec #:  [id number]      Height:       63.0 in Accession #:    [id number]     Weight:       123.0 lb Date of Birth:  Jan 13, 1935       BSA:          1.573 m Patient Age:    88 years       BP:           103/66 mmHg Patient Gender: F              HR:           90 bpm. Exam Location:  Inpatient Procedure: Limited Echo, Cardiac Doppler and Color Doppler Indications:    I48.91* Unspeicified atrial fibrillation  History:        Patient has prior history of Echocardiogram examinations, most                 recent 02/27/2023. Abnormal ECG, Arrythmias:Atrial Fibrillation                 and Tachycardia; Risk Factors:Hypertension. Pulmonary embolus.  Sonographer:    Sheralyn Boatman RDCS Referring Phys: [id number] STEPHEN K CHIU IMPRESSIONS  1. Left ventricular ejection fraction, by estimation, is 70 to 75%. The left ventricle has hyperdynamic function. The left ventricle has no regional wall motion abnormalities. There is mild left ventricular hypertrophy. Left ventricular diastolic parameters are indeterminate. Elevated left atrial pressure.  2. Right ventricular systolic function is normal. The right ventricular  size is normal. There is normal pulmonary artery systolic pressure.  3. The mitral valve is abnormal. No evidence of mitral valve regurgitation. Moderate mitral stenosis. The mean mitral valve gradient is 6.0 mmHg. HR 90 bpm. Severe mitral annular calcification.  4. Tricuspid valve regurgitation is moderate to severe.  5. The aortic valve has an indeterminant number of cusps. There is moderate calcification of the aortic valve. There is moderate thickening of the aortic valve. Aortic valve regurgitation is trivial. Aortic valve sclerosis is present, with no evidence of aortic valve stenosis.  6. There is Moderate (Grade III) atheroma plaque.  7. The inferior vena cava is normal in size with greater than 50% respiratory variability, suggesting right atrial pressure of 3 mmHg.  8. Limited echo in setting of new onset afib FINDINGS  Left Ventricle: Left ventricular ejection fraction, by estimation, is 70 to 75%. The left ventricle has hyperdynamic function. The left ventricle has no regional wall motion abnormalities. The left ventricular internal cavity size was small. There is mild left ventricular hypertrophy. Left ventricular diastolic parameters are indeterminate. Elevated left atrial pressure. Right Ventricle: The right ventricular size is normal. Right vetricular wall thickness was not well visualized. Right ventricular systolic function is normal. There is normal pulmonary artery systolic pressure. The tricuspid regurgitant velocity is 2.85 m/s, and with an assumed right atrial pressure of 3 mmHg, the estimated right ventricular systolic pressure is 35.5 mmHg. Pericardium: There is no evidence of pericardial effusion. Mitral Valve: The mitral valve is abnormal. There is severe calcification of the mitral valve leaflet(s). Severe mitral annular calcification. Moderate mitral valve stenosis. MV peak gradient, 11.8 mmHg. The mean mitral valve gradient is 6.0 mmHg. Tricuspid Valve: The tricuspid valve is grossly  normal. Tricuspid valve regurgitation is moderate to severe. No evidence of tricuspid stenosis. Aortic Valve: The aortic valve has an indeterminant number of cusps. There is moderate calcification of the aortic valve. There is moderate thickening of the aortic valve.  There is moderate aortic valve annular calcification. Aortic valve regurgitation is trivial. Aortic valve sclerosis is present, with no evidence of aortic valve stenosis. Aortic valve mean gradient measures 5.6 mmHg. Aortic valve peak gradient measures 11.7 mmHg. Aortic valve area, by VTI measures 2.68 cm. Pulmonic Valve: The pulmonic valve was grossly normal. Aorta: The aortic root and ascending aorta are structurally normal, with no evidence of dilitation. There is moderate (Grade III) atheroma plaque. Venous: The inferior vena cava is normal in size with greater than 50% respiratory variability, suggesting right atrial pressure of 3 mmHg. Additional Comments: Spectral Doppler performed. Color Doppler performed.  LEFT VENTRICLE PLAX 2D LVIDd:         3.50 cm     Diastology LVIDs:         2.00 cm     LV e' medial:    3.05 cm/s LV PW:         1.20 cm     LV E/e' medial:  53.7 LV IVS:        1.20 cm     LV e' lateral:   4.35 cm/s LVOT diam:     2.10 cm     LV E/e' lateral: 37.6 LV SV:         70 LV SV Index:   45 LVOT Area:     3.46 cm  LV Volumes (MOD) LV vol d, MOD A2C: 31.0 ml LV vol d, MOD A4C: 33.9 ml LV vol s, MOD A2C: 8.4 ml LV vol s, MOD A4C: 10.9 ml LV SV MOD A2C:     22.6 ml LV SV MOD A4C:     33.9 ml LV SV MOD BP:      23.4 ml IVC IVC diam: 1.30 cm LEFT ATRIUM         Index LA diam:    3.30 cm 2.10 cm/m  AORTIC VALVE AV Area (Vmax):    2.27 cm AV Area (Vmean):   2.56 cm AV Area (VTI):     2.68 cm AV Vmax:           170.77 cm/s AV Vmean:          108.707 cm/s AV VTI:            0.262 m AV Peak Grad:      11.7 mmHg AV Mean Grad:      5.6 mmHg LVOT Vmax:         112.00 cm/s LVOT Vmean:        80.500 cm/s LVOT VTI:          0.203 m LVOT/AV  VTI ratio: 0.77  AORTA Ao Asc diam: 3.20 cm MITRAL VALVE                TRICUSPID VALVE MV Area (PHT): 2.69 cm     TR Peak grad:   32.5 mmHg MV Area VTI:   1.62 cm     TR Vmax:        285.00 cm/s MV Peak grad:  11.8 mmHg MV Mean grad:  6.0 mmHg     SHUNTS MV Vmax:       1.72 m/s     Systemic VTI:  0.20 m MV Vmean:      121.0 cm/s   Systemic Diam: 2.10 cm MV Decel Time: 282 msec MV E velocity: 163.67 cm/s MV A velocity: 151.33 cm/s MV E/A ratio:  1.08 Dina Rich MD Electronically signed by Dina Rich MD Signature Date/Time: 05/20/2023/4:22:30 PM  Final     Cardiac Studies   Echo see above.   Patient Profile     88 y.o. female with a history of palpitations, RBBB, bilateral PE in 02/2023 on Eliquis, hyperlipidemia, type 2 diabetes mellitus, and GERD  who is being seen 05/20/2023 for the evaluation of new onset atrial fibrillation at the request of Dr. Rhona Leavens.   Assessment & Plan    Atrial fib:    Patient was already on anticoagulation prior to this presentation.  Continue.  We did slightly increase her beta blocker this admission.      We will arrange follow up in the office afterward.     Moderate to severe TR:  Medical management.    For questions or updates, please contact [facility] Please consult [email or web address] for contact info under Cardiology/STEMI.   Signed, Rollene Rotunda, MD  05/21/2023, 1:01 PM

## 2023-05-21 NOTE — Plan of Care (Addendum)
  Problem: Education: Goal: Knowledge of General Education information will improve Description: Including pain rating scale, medication(s)/side effects and non-pharmacologic comfort measures Outcome: Completed/Met   Problem: Health Behavior/Discharge Planning: Goal: Ability to manage health-related needs will improve Outcome: Completed/Met   Problem: Clinical Measurements: Goal: Ability to maintain clinical measurements within normal limits will improve Outcome: Completed/Met Goal: Will remain free from infection Outcome: Completed/Met Goal: Diagnostic test results will improve Outcome: Completed/Met Goal: Respiratory complications will improve Outcome: Completed/Met Goal: Cardiovascular complication will be avoided Outcome: Completed/Met   Problem: Activity: Goal: Risk for activity intolerance will decrease Outcome: Completed/Met   Problem: Nutrition: Goal: Adequate nutrition will be maintained Outcome: Completed/Met   Problem: Elimination: Goal: Will not experience complications related to bowel motility Outcome: Completed/Met Goal: Will not experience complications related to urinary retention Outcome: Completed/Met   Problem: Coping: Goal: Level of anxiety will decrease Outcome: Completed/Met   Problem: Pain Management: Goal: General experience of comfort will improve Outcome: Completed/Met   Problem: Safety: Goal: Ability to remain free from injury will improve Outcome: Completed/Met   Problem: Skin Integrity: Goal: Risk for impaired skin integrity will decrease Outcome: Completed/Met   Problem: Education: Goal: Knowledge of disease or condition will improve Outcome: Completed/Met Goal: Understanding of medication regimen will improve Outcome: Completed/Met Goal: Individualized Educational Video(s) Outcome: Completed/Met   Problem: Activity: Goal: Ability to tolerate increased activity will improve Outcome: Completed/Met   Problem: Health  Behavior/Discharge Planning: Goal: Ability to safely manage health-related needs after discharge will improve Outcome: Completed/Met   Problem: Education: Goal: Ability to describe self-care measures that may prevent or decrease complications (Diabetes Survival Skills Education) will improve Outcome: Completed/Met Goal: Individualized Educational Video(s) Outcome: Completed/Met   Problem: Cardiac: Goal: Ability to achieve and maintain adequate cardiopulmonary perfusion will improve Outcome: Completed/Met   Problem: Coping: Goal: Ability to adjust to condition or change in health will improve Outcome: Completed/Met   Problem: Fluid Volume: Goal: Ability to maintain a balanced intake and output will improve Outcome: Completed/Met   Problem: Health Behavior/Discharge Planning: Goal: Ability to identify and utilize available resources and services will improve Outcome: Completed/Met Goal: Ability to manage health-related needs will improve Outcome: Completed/Met   Problem: Metabolic: Goal: Ability to maintain appropriate glucose levels will improve Outcome: Completed/Met   Problem: Nutritional: Goal: Maintenance of adequate nutrition will improve Outcome: Completed/Met Goal: Progress toward achieving an optimal weight will improve Outcome: Completed/Met   Problem: Skin Integrity: Goal: Risk for impaired skin integrity will decrease Outcome: Completed/Met   Problem: Tissue Perfusion: Goal: Adequacy of tissue perfusion will improve Outcome: Completed/Met    Pt & dgtr Misty Stanley given home care  & discharge instructions. Pt's IV & monitor were removed. All of the pt's belongings returned ( clothes & glasses), & discharge instructions including but not limited to home care, medications & etc given to the pt. All questions were answered for pt & dgtr. Teachback used to confirm the education. Sanda Linger, RN  Pt's discharge summary (AVS) was updated for her to take the metformin  tonight & to also take the prednisone 1/2 tablets for the last 2 days. Prior to discharge. It was hand written on the 2nd AVS that was printed with the updated ot in [place]. Sanda Linger, RN

## 2023-05-21 NOTE — Discharge Summary (Signed)
Physician Discharge Summary   Patient: Melissa [name] MRN: [id number] DOB: 1935-06-17  Admit date:     05/19/2023  Discharge date: 05/21/23  Discharge Physician: Rickey Barbara   PCP: Natalia Leatherwood, DO   Recommendations at discharge:    Follow up with PCP in 1-2 weeks Follow up with Cardiology as scheduled F/u with 2 week Zio monitor as set up by [facility]  Discharge Diagnoses: Principal Problem:   New onset a-fib (HCC) Active Problems:   Hypomagnesemia   Nausea and vomiting  Resolved Problems:   * No resolved hospital problems. *  Hospital Course: 88 y.o. female with medical history significant of PE on Eliquis, T2DM, GERD, depression, HLD and HTN who presented to [facility] for evaluation of nausea, poor appetite, palpitations and chest discomfort. Patient states that she ate at a [employer] Tuesday evening and since Tuesday night, she has felt unwell. She reports having nausea and no appetite. She had 1 episode of emesis 2 days ago and since then has had decreased p.o. intake. She has not taken most of her meds except her Eliquis for the last 2 days because she is afraid to take them on an empty stomach.  She reports a history of palpitations but not recently.  She endorsed mild cough but denies any fevers, chills, chest pain, abdominal pain, headaches or dizziness.   Assessment and Plan: # New onset A-fib, resolved Patient with history of tachycardia and PE on Eliquis presenting for evaluation after not feeling well for the past 3 days found to have new onset A-fib with RVR.  -continued afib RVR to the 140's this AM -Cardiology has recommended changing metoprolol to tartrate 25mg  bid with 2 week event monitor after discharge to quantify burden of her afib -continue eliquis per below   # Hypomagnesemia, resolved -replaced   # Generalized weakness # Poor appetite, nausea likely secondary to URI Reports feeling overall weak but no focal weakness. She has had  decreased p.o. intake over the last 2 days due to nausea and low appetite but denies any fatigue.  CT A/P shows diverticulosis but no acute intra-abdominal pathology.  -Pt does have some upper respiratory symptoms. Afebrile, no leukocytosis, CXR clear -Would cont with supportive care for now. Prescribed PRN albuterol MDI and prednisone taper   # T2DM A1c 6.3% 3 weeks ago. Blood sugar of 191 on CMP. -SSI with sliding scale, CBG monitoring while in hospital -Resumed home metformin at discharge   # COPD -As needed albuterol for cough or wheeze   #Hx of PE -Eliquis 5 mg twice daily   # HLD -Atorvastatin 40 mg daily   # Large hiatal hernia # GERD -Protonix 40 mg daily   # Depression -Lexapro 5 mg daily       Consultants: Cardiology Procedures performed:   Disposition: Home Diet recommendation:  Cardiac diet DISCHARGE MEDICATION: Allergies as of 05/21/2023   No Known Allergies      Medication List     STOP taking these medications    fluocinonide cream 0.05 % Commonly known as: LIDEX   metoprolol succinate 25 MG 24 hr tablet Commonly known as: TOPROL-XL       TAKE these medications    albuterol 108 (90 Base) MCG/ACT inhaler Commonly known as: VENTOLIN HFA Inhale 2 puffs into the lungs every 6 (six) hours as needed for wheezing or shortness of breath.   apixaban 5 MG Tabs tablet Commonly known as: Eliquis Take 1 tablet (5 mg total) by mouth  2 (two) times daily.   atorvastatin 40 MG tablet Commonly known as: LIPITOR Take 1 tablet (40 mg total) by mouth daily.   escitalopram 5 MG tablet Commonly known as: Lexapro Take 1 tablet (5 mg total) by mouth daily.   gabapentin 300 MG capsule Commonly known as: NEURONTIN Take 1 capsule (300 mg total) by mouth at bedtime.   metFORMIN 500 MG tablet Commonly known as: GLUCOPHAGE 500 mg (one tablet) in the morning and 1,000 mg (2 tablets) at night   metoprolol tartrate 25 MG tablet Commonly known as:  LOPRESSOR Take 1 tablet (25 mg total) by mouth 2 (two) times daily.   multivitamin tablet Take 1 tablet by mouth daily.   Oyster Shell Calcium/D 250-125 MG-UNIT Tabs Take by mouth.   pantoprazole 40 MG tablet Commonly known as: PROTONIX Take 1 tablet (40 mg total) by mouth daily.        Follow-up Information     Corrin Parker, PA-C Follow up.   Specialty: Cardiology Why: Hospital follow-up with Cardiology scheduled for 06/27/2023 at 10:05am. Please arrive 15 minutes early for check-in. If this date/ time does not work for you, please call our office to reschedule. Contact information: [address] [place] [address] [place] [address] [phone]         Felix Pacini A, DO Follow up in 2 week(s).   Specialty: Family Medicine Why: Hospital follow up Contact information: [address] [place] [address] [phone]                Discharge Exam: Ceasar Mons Weights   05/19/23 0527  Weight: 55.8 kg   General exam: Awake, laying in bed, in nad Respiratory system: Normal respiratory effort, no wheezing Cardiovascular system: regular rate, s1, s2 Gastrointestinal system: Soft, nondistended, positive BS Central nervous system: CN2-12 grossly intact, strength intact Extremities: Perfused, no clubbing Skin: Normal skin turgor, no notable skin lesions seen Psychiatry: Mood normal // no visual hallucinations   Condition at discharge: fair  The results of significant diagnostics from this hospitalization (including imaging, microbiology, ancillary and laboratory) are listed below for reference.   Imaging Studies: ECHOCARDIOGRAM LIMITED  Result Date: 05/20/2023    ECHOCARDIOGRAM LIMITED REPORT   Patient Name:   Melissa [name] Date of Exam: 05/20/2023 Medical Rec #:  [id number]      Height:       63.0 in Accession #:    [id number]     Weight:       123.0 lb Date of Birth:  19-Oct-1934       BSA:          1.573 m Patient Age:    88 years       BP:           103/66  mmHg Patient Gender: F              HR:           90 bpm. Exam Location:  Inpatient Procedure: Limited Echo, Cardiac Doppler and Color Doppler Indications:    I48.91* Unspeicified atrial fibrillation  History:        Patient has prior history of Echocardiogram examinations, most                 recent 02/27/2023. Abnormal ECG, Arrythmias:Atrial Fibrillation                 and Tachycardia; Risk Factors:Hypertension. Pulmonary embolus.  Sonographer:    Sheralyn Boatman RDCS Referring Phys: [phone]  Chistopher Mangino K Grantley Savage IMPRESSIONS  1. Left ventricular ejection fraction, by estimation, is 70 to 75%. The left ventricle has hyperdynamic function. The left ventricle has no regional wall motion abnormalities. There is mild left ventricular hypertrophy. Left ventricular diastolic parameters are indeterminate. Elevated left atrial pressure.  2. Right ventricular systolic function is normal. The right ventricular size is normal. There is normal pulmonary artery systolic pressure.  3. The mitral valve is abnormal. No evidence of mitral valve regurgitation. Moderate mitral stenosis. The mean mitral valve gradient is 6.0 mmHg. HR 90 bpm. Severe mitral annular calcification.  4. Tricuspid valve regurgitation is moderate to severe.  5. The aortic valve has an indeterminant number of cusps. There is moderate calcification of the aortic valve. There is moderate thickening of the aortic valve. Aortic valve regurgitation is trivial. Aortic valve sclerosis is present, with no evidence of aortic valve stenosis.  6. There is Moderate (Grade III) atheroma plaque.  7. The inferior vena cava is normal in size with greater than 50% respiratory variability, suggesting right atrial pressure of 3 mmHg.  8. Limited echo in setting of new onset afib FINDINGS  Left Ventricle: Left ventricular ejection fraction, by estimation, is 70 to 75%. The left ventricle has hyperdynamic function. The left ventricle has no regional wall motion abnormalities. The left  ventricular internal cavity size was small. There is mild left ventricular hypertrophy. Left ventricular diastolic parameters are indeterminate. Elevated left atrial pressure. Right Ventricle: The right ventricular size is normal. Right vetricular wall thickness was not well visualized. Right ventricular systolic function is normal. There is normal pulmonary artery systolic pressure. The tricuspid regurgitant velocity is 2.85 m/s, and with an assumed right atrial pressure of 3 mmHg, the estimated right ventricular systolic pressure is 35.5 mmHg. Pericardium: There is no evidence of pericardial effusion. Mitral Valve: The mitral valve is abnormal. There is severe calcification of the mitral valve leaflet(s). Severe mitral annular calcification. Moderate mitral valve stenosis. MV peak gradient, 11.8 mmHg. The mean mitral valve gradient is 6.0 mmHg. Tricuspid Valve: The tricuspid valve is grossly normal. Tricuspid valve regurgitation is moderate to severe. No evidence of tricuspid stenosis. Aortic Valve: The aortic valve has an indeterminant number of cusps. There is moderate calcification of the aortic valve. There is moderate thickening of the aortic valve. There is moderate aortic valve annular calcification. Aortic valve regurgitation is trivial. Aortic valve sclerosis is present, with no evidence of aortic valve stenosis. Aortic valve mean gradient measures 5.6 mmHg. Aortic valve peak gradient measures 11.7 mmHg. Aortic valve area, by VTI measures 2.68 cm. Pulmonic Valve: The pulmonic valve was grossly normal. Aorta: The aortic root and ascending aorta are structurally normal, with no evidence of dilitation. There is moderate (Grade III) atheroma plaque. Venous: The inferior vena cava is normal in size with greater than 50% respiratory variability, suggesting right atrial pressure of 3 mmHg. Additional Comments: Spectral Doppler performed. Color Doppler performed.  LEFT VENTRICLE PLAX 2D LVIDd:         3.50 cm      Diastology LVIDs:         2.00 cm     LV e' medial:    3.05 cm/s LV PW:         1.20 cm     LV E/e' medial:  53.7 LV IVS:        1.20 cm     LV e' lateral:   4.35 cm/s LVOT diam:     2.10 cm  LV E/e' lateral: 37.6 LV SV:         70 LV SV Index:   45 LVOT Area:     3.46 cm  LV Volumes (MOD) LV vol d, MOD A2C: 31.0 ml LV vol d, MOD A4C: 33.9 ml LV vol s, MOD A2C: 8.4 ml LV vol s, MOD A4C: 10.9 ml LV SV MOD A2C:     22.6 ml LV SV MOD A4C:     33.9 ml LV SV MOD BP:      23.4 ml IVC IVC diam: 1.30 cm LEFT ATRIUM         Index LA diam:    3.30 cm 2.10 cm/m  AORTIC VALVE AV Area (Vmax):    2.27 cm AV Area (Vmean):   2.56 cm AV Area (VTI):     2.68 cm AV Vmax:           170.77 cm/s AV Vmean:          108.707 cm/s AV VTI:            0.262 m AV Peak Grad:      11.7 mmHg AV Mean Grad:      5.6 mmHg LVOT Vmax:         112.00 cm/s LVOT Vmean:        80.500 cm/s LVOT VTI:          0.203 m LVOT/AV VTI ratio: 0.77  AORTA Ao Asc diam: 3.20 cm MITRAL VALVE                TRICUSPID VALVE MV Area (PHT): 2.69 cm     TR Peak grad:   32.5 mmHg MV Area VTI:   1.62 cm     TR Vmax:        285.00 cm/s MV Peak grad:  11.8 mmHg MV Mean grad:  6.0 mmHg     SHUNTS MV Vmax:       1.72 m/s     Systemic VTI:  0.20 m MV Vmean:      121.0 cm/s   Systemic Diam: 2.10 cm MV Decel Time: 282 msec MV E velocity: 163.67 cm/s MV A velocity: 151.33 cm/s MV E/A ratio:  1.08 Dina Rich MD Electronically signed by Dina Rich MD Signature Date/Time: 05/20/2023/4:22:30 PM    Final    CT ABDOMEN PELVIS W CONTRAST  Result Date: 05/19/2023 CLINICAL DATA:  88 year old female with history of cough, mucus production, nausea and vomiting for the past several days. EXAM: CT ABDOMEN AND PELVIS WITH CONTRAST TECHNIQUE: Multidetector CT imaging of the abdomen and pelvis was performed using the standard protocol following bolus administration of intravenous contrast. RADIATION DOSE REDUCTION: This exam was performed according to the departmental  dose-optimization program which includes automated exposure control, adjustment of the mA and/or kV according to patient size and/or use of iterative reconstruction technique. CONTRAST:  80mL OMNIPAQUE IOHEXOL 300 MG/ML  SOLN COMPARISON:  CT the abdomen and pelvis 03/20/2023. FINDINGS: Lower chest: Massive hiatal hernia with predominantly intrathoracic stomach, similar to the prior examination. Severe calcifications of the mitral annulus. Hepatobiliary: Small low-attenuation lesions in the liver, compatible with tiny cysts and/or biliary hamartomas (no imaging follow-up recommended). No other suspicious appearing hepatic lesions. No intra or extrahepatic biliary ductal dilatation. Gallbladder is unremarkable in appearance. Pancreas: No pancreatic mass. No pancreatic ductal dilatation. No pancreatic or peripancreatic fluid collections or inflammatory changes. Spleen: Unremarkable. Adrenals/Urinary Tract: Low-attenuation lesions in both kidneys compatible with simple cysts (Bosniak class 1, no  imaging follow-up recommended), largest of which measures up to 8.1 cm in the interpolar region of the left kidney. No suspicious renal lesions are noted. No hydroureteronephrosis. Urinary bladder is unremarkable in appearance. Stomach/Bowel: Intra-abdominal portion of the stomach is unremarkable. No pathologic dilatation of small bowel or colon. A few scattered colonic diverticula are noted, without surrounding inflammatory changes to indicate an acute diverticulitis at this time. The appendix is not confidently identified and may be surgically absent. Regardless, there are no inflammatory changes noted adjacent to the cecum to suggest the presence of an acute appendicitis at this time. Vascular/Lymphatic: Atherosclerosis in the abdominal aorta and pelvic vasculature. No lymphadenopathy noted in the abdomen or pelvis. Reproductive: Status post hysterectomy. Ovaries are not confidently identified may be surgically absent or  atrophic. Other: No significant volume of ascites.  No pneumoperitoneum. Musculoskeletal: There are no aggressive appearing lytic or blastic lesions noted in the visualized portions of the skeleton. IMPRESSION: 1. No definite acute findings are noted in the abdomen or pelvis to account for the patient's symptoms. 2. Extensive colonic diverticulosis without evidence of acute diverticulitis at this time. 3. Massive hiatal hernia with intrathoracic stomach, similar to the prior examination. 4. There are calcifications of the mitral annulus. Echocardiographic correlation for evaluation of potential valvular dysfunction may be warranted if clinically indicated. 5. Additional incidental findings, as above. Electronically Signed   By: Trudie Reed M.D.   On: 05/19/2023 07:31   DG Chest Port 1 View  Result Date: 05/19/2023 CLINICAL DATA:  [address] with cough, nausea and vomiting. EXAM: PORTABLE CHEST 1 VIEW COMPARISON:  CTA chest 02/25/2023 FINDINGS: The lungs are mildly emphysematous but clear. There is a large hiatal hernia. Mild cardiomegaly without overt CHF. Stable mediastinum with aortic atherosclerosis. The mitral ring is heavily calcified. There is osteopenia with a partially healed proximal left humeral fracture, which previously appeared more recent. No new osseous abnormality is seen. Degenerative change thoracic spine. IMPRESSION: 1. No evidence of acute chest disease.  Stable COPD chest. 2. Large hiatal hernia. 3. Mild cardiomegaly. 4. Aortic atherosclerosis. 5. Partially healed proximal left humeral fracture. Electronically Signed   By: Almira Bar M.D.   On: 05/19/2023 06:15    Microbiology: Results for orders placed or performed during the hospital encounter of 05/19/23  Resp panel by RT-PCR (RSV, Flu A&B, Covid) Anterior Nasal Swab     Status: None   Collection Time: 05/19/23  5:39 AM   Specimen: Anterior Nasal Swab  Result Value Ref Range Status   SARS Coronavirus 2 by RT PCR NEGATIVE  NEGATIVE Final    Comment: (NOTE) SARS-CoV-2 target nucleic acids are NOT DETECTED.  The SARS-CoV-2 RNA is generally detectable in upper respiratory specimens during the acute phase of infection. The lowest concentration of SARS-CoV-2 viral copies this assay can detect is 138 copies/mL. A negative result does not preclude SARS-Cov-2 infection and should not be used as the sole basis for treatment or other patient management decisions. A negative result may occur with  improper specimen collection/handling, submission of specimen other than nasopharyngeal swab, presence of viral mutation(s) within the areas targeted by this assay, and inadequate number of viral copies(<138 copies/mL). A negative result must be combined with clinical observations, patient history, and epidemiological information. The expected result is Negative.  Fact Sheet for Patients:  BloggerCourse.com  Fact Sheet for Healthcare Providers:  SeriousBroker.it  This test is no t yet approved or cleared by the [place] FDA and  has been authorized for detection and/or  diagnosis of SARS-CoV-2 by FDA under an Emergency Use Authorization (EUA). This EUA will remain  in effect (meaning this test can be used) for the duration of the COVID-19 declaration under Section 564(b)(1) of the Act, 21 U.S.C.section 360bbb-3(b)(1), unless the authorization is terminated  or revoked sooner.       Influenza A by PCR NEGATIVE NEGATIVE Final   Influenza B by PCR NEGATIVE NEGATIVE Final    Comment: (NOTE) The Xpert Xpress SARS-CoV-2/FLU/RSV plus assay is intended as an aid in the diagnosis of influenza from Nasopharyngeal swab specimens and should not be used as a sole basis for treatment. Nasal washings and aspirates are unacceptable for Xpert Xpress SARS-CoV-2/FLU/RSV testing.  Fact Sheet for Patients: BloggerCourse.com  Fact Sheet for Healthcare  Providers: SeriousBroker.it  This test is not yet approved or cleared by the [place] FDA and has been authorized for detection and/or diagnosis of SARS-CoV-2 by FDA under an Emergency Use Authorization (EUA). This EUA will remain in effect (meaning this test can be used) for the duration of the COVID-19 declaration under Section 564(b)(1) of the Act, 21 U.S.C. section 360bbb-3(b)(1), unless the authorization is terminated or revoked.     Resp Syncytial Virus by PCR NEGATIVE NEGATIVE Final    Comment: (NOTE) Fact Sheet for Patients: BloggerCourse.com  Fact Sheet for Healthcare Providers: SeriousBroker.it  This test is not yet approved or cleared by the [place] FDA and has been authorized for detection and/or diagnosis of SARS-CoV-2 by FDA under an Emergency Use Authorization (EUA). This EUA will remain in effect (meaning this test can be used) for the duration of the COVID-19 declaration under Section 564(b)(1) of the Act, 21 U.S.C. section 360bbb-3(b)(1), unless the authorization is terminated or revoked.  Performed at [facility], [address], [place], [place] [address]     Labs: CBC: Recent Labs  Lab 05/19/23 0539 05/20/23 0331 05/21/23 0442  WBC 9.0 5.8 5.2  NEUTROABS 6.7  --   --   HGB 12.3 12.0 11.4*  HCT 38.1 38.8 36.6  MCV 92.9 93.9 93.8  PLT 271 302 294   Basic Metabolic Panel: Recent Labs  Lab 05/19/23 0539 05/19/23 1146 05/20/23 0331 05/21/23 0442  NA 138  --  137 137  K 3.8  --  3.5 4.7  CL 102  --  102 104  CO2 23  --  27 28  GLUCOSE 191*  --  138* 130*  BUN 12  --  13 23  CREATININE 0.78  --  0.85 1.03*  CALCIUM 9.0  --  9.1 9.4  MG 0.8* 3.1* 2.3 1.8  PHOS  --  3.9 4.7*  --    Liver Function Tests: Recent Labs  Lab 05/19/23 0539 05/21/23 0442  AST 16 17  ALT 8 12  ALKPHOS 63 63  BILITOT 0.6 0.5  PROT 6.7 6.3*  ALBUMIN 3.9  3.1*   CBG: Recent Labs  Lab 05/20/23 1139 05/20/23 1653 05/20/23 2125 05/21/23 0752 05/21/23 1222  GLUCAP 220* 118* 161* 135* 112*    Discharge time spent: less than 30 minutes.  Signed: Rickey Barbara, MD [facility] 05/21/2023

## 2023-05-23 ENCOUNTER — Ambulatory Visit: Payer: No Typology Code available for payment source | Attending: Student

## 2023-05-23 DIAGNOSIS — I4891 Unspecified atrial fibrillation: Secondary | ICD-10-CM

## 2023-05-23 NOTE — Progress Notes (Unsigned)
Enrolled patient for a 14 day Zio XT monitor to be mailed to patients home  [place] to read

## 2023-05-31 ENCOUNTER — Encounter: Payer: Self-pay | Admitting: Family Medicine

## 2023-05-31 ENCOUNTER — Ambulatory Visit: Payer: No Typology Code available for payment source | Admitting: Family Medicine

## 2023-05-31 VITALS — BP 118/62 | HR 99 | Temp 98.3°F | Wt 119.4 lb

## 2023-05-31 DIAGNOSIS — Z9289 Personal history of other medical treatment: Secondary | ICD-10-CM

## 2023-05-31 DIAGNOSIS — I4891 Unspecified atrial fibrillation: Secondary | ICD-10-CM

## 2023-05-31 MED ORDER — METOPROLOL TARTRATE 25 MG PO TABS: 25.0000 mg | ORAL_TABLET | Freq: Two times a day (BID) | ORAL | 0 refills | Status: DC

## 2023-05-31 NOTE — Progress Notes (Signed)
Melissa [name] , [birth year], 88 y.o., female MRN: [id number] Patient Care Team    Relationship Specialty Notifications Start End  Natalia Leatherwood, DO PCP - General Family Medicine  07/03/18   Lennette Bihari, MD PCP - Cardiology Cardiology  05/20/23   Edmon Crape, MD Consulting Physician Ophthalmology  07/03/18   Allie Bossier, MD Consulting Physician Obstetrics and Gynecology  07/04/18   Lennette Bihari, MD Consulting Physician Cardiology  07/04/18   Patton Salles, MD Consulting Physician Obstetrics and Gynecology  02/18/20   Ponciano Ort, [facility]    12/01/21     Chief Complaint  Patient presents with   Atrial Fibrillation     Subjective:  Melissa [name]  is a 88 y.o. female presents for hospital follow up after recent admission on 05/19/2023 for primary diagnosis new onset A-fib.  Patient was discharged on 05/21/2023 to home. Patients discharge summary has been reviewed, as well as all labs/image studies obtained during hospitalization.  Medication reconciliation completed today.  Patients hospital course: Patient presented to the emergency room at [facility] for evaluation of nausea, poor appetite, palpitations and chest discomfort that had been going on for about 3 days prior.  She was found to be in new onset A-fib and transferred for admission who has come.  Metoprolol to tartrate 25 mg twice daily was started.  Patient has been on chronic Eliquis for her history of pulmonary embolism.   Since hospital discharge patient reports patient reports since hospital discharge she has been feeling well.  She is taking the metoprolol tartrate 25 mg twice daily as prescribed.  She is compliant with her Eliquis twice daily.  She has had no recurrences of A-fib that she is aware of.  She has not placed a ZIO monitor on herself yet as she does not understand the directions.  No results for input(s): "HGB", "HCT", "WBC", "PLT" in the last 168 hours.    Latest  Ref Rng & Units 05/21/2023    4:42 AM 05/20/2023    3:31 AM 05/19/2023    5:39 AM  CMP  Glucose 70 - 99 mg/dL 865  784  696   BUN 8 - 23 mg/dL 23  13  12    Creatinine 0.44 - 1.00 mg/dL 2.95  2.84  1.32   Sodium 135 - 145 mmol/L 137  137  138   Potassium 3.5 - 5.1 mmol/L 4.7  3.5  3.8   Chloride 98 - 111 mmol/L 104  102  102   CO2 22 - 32 mmol/L 28  27  23    Calcium 8.9 - 10.3 mg/dL 9.4  9.1  9.0   Total Protein 6.5 - 8.1 g/dL 6.3   6.7   Total Bilirubin <1.2 mg/dL 0.5   0.6   Alkaline Phos 38 - 126 U/L 63   63   AST 15 - 41 U/L 17   16   ALT 0 - 44 U/L 12   8       ECHOCARDIOGRAM LIMITED Result Date: 05/20/2023  IMPRESSIONS   1. Left ventricular ejection fraction, by estimation, is 70 to 75%. The left ventricle has hyperdynamic function. The left ventricle has no regional wall motion abnormalities. There is mild left ventricular hypertrophy. Left ventricular diastolic parameters are indeterminate. Elevated left atrial pressure.   2. Right ventricular systolic function is normal. The right ventricular size is normal. There is normal pulmonary artery systolic pressure.  3. The mitral valve is abnormal. No evidence of mitral valve regurgitation. Moderate mitral stenosis. The mean mitral valve gradient is 6.0 mmHg. HR 90 bpm. Severe mitral annular calcification.   4. Tricuspid valve regurgitation is moderate to severe.   5. The aortic valve has an indeterminant number of cusps. There is moderate calcification of the aortic valve. There is moderate thickening of the aortic valve. Aortic valve regurgitation is trivial. Aortic valve sclerosis is present, with no evidence of aortic valve stenosis.   6. There is Moderate (Grade III) atheroma plaque.   7. The inferior vena cava is normal in size with greater than 50% respiratory variability, suggesting right atrial pressure of 3 mmHg.   8. Limited echo in setting of new onset afib CT ABDOMEN PELVIS W CONTRAST Result Date:  05/19/2023 IMPRESSION:  1. No definite acute findings are noted in the abdomen or pelvis to account for the patient's symptoms.  2. Extensive colonic diverticulosis without evidence of acute diverticulitis at this time.  3. Massive hiatal hernia with intrathoracic stomach, similar to the prior examination.  4. There are calcifications of the mitral annulus. Echocardiographic correlation for evaluation of potential valvular dysfunction may be warranted if clinically indicated.   DG Chest Port 1 View Result Date: 05/19/2023 IMPRESSION:  1. No evidence of acute chest disease.  Stable COPD chest.  2. Large hiatal hernia.  3. Mild cardiomegaly.  4. Aortic atherosclerosis.  5. Partially healed proximal left humeral fracture.       05/09/2023   10:28 AM 12/26/2022    9:33 AM 10/26/2022    1:32 PM 06/29/2022    1:53 PM 12/01/2021   10:07 AM  Depression screen PHQ 2/9  Decreased Interest 0 0 0 0 0  Down, Depressed, Hopeless 0 0 0 0 1  PHQ - 2 Score 0 0 0 0 1  Altered sleeping     3  Tired, decreased energy     2  Change in appetite     0  Feeling bad or failure about yourself      0  Trouble concentrating     0  Moving slowly or fidgety/restless     0  Suicidal thoughts     0  PHQ-9 Score     6    No Known Allergies Social History   Tobacco Use   Smoking status: Never   Smokeless tobacco: Never  Substance Use Topics   Alcohol use: No   Past Medical History:  Diagnosis Date   Burning tongue syndrome 11/11/2015   Colon polyps    Cystocele with prolapse 2019   Dr. Marice Potter- Monitor for now.    Detached retina, right    Diabetes mellitus    Diaphragmatic hernia    Diverticulitis 01/26/2022   GERD (gastroesophageal reflux disease)    Humerus fracture 02/26/2023   Hyperlipidemia    Insomnia    Macular degeneration    Past Surgical History:  Procedure Laterality Date   ABDOMINAL HYSTERECTOMY     BREAST CYST ASPIRATION  11/03/2020   FOOT SURGERY     OOPHORECTOMY     [place]  removed with Hyst   RETINAL DETACHMENT SURGERY     TONSILLECTOMY     Family History  Problem Relation Age of Onset   Heart disease Other    Breast cancer Other    Breast cancer Mother        metastatic--to bone   CAD Father    Cancer Maternal Grandmother  unknown   Cancer Paternal Grandmother        colon   Allergies as of 05/31/2023   No Known Allergies      Medication List        Accurate as of May 31, 2023  1:10 PM. If you have any questions, ask your nurse or doctor.          albuterol 108 (90 Base) MCG/ACT inhaler Commonly known as: VENTOLIN HFA Inhale 2 puffs into the lungs every 6 (six) hours as needed for wheezing or shortness of breath.   albuterol 108 (90 Base) MCG/ACT inhaler Commonly known as: VENTOLIN HFA Inhale 2 puffs into the lungs every 4 (four) hours as needed for wheezing or shortness of breath.   apixaban 5 MG Tabs tablet Commonly known as: Eliquis Take 1 tablet (5 mg total) by mouth 2 (two) times daily.   atorvastatin 40 MG tablet Commonly known as: LIPITOR Take 1 tablet (40 mg total) by mouth daily.   escitalopram 5 MG tablet Commonly known as: Lexapro Take 1 tablet (5 mg total) by mouth daily.   gabapentin 300 MG capsule Commonly known as: NEURONTIN Take 1 capsule (300 mg total) by mouth at bedtime.   metFORMIN 500 MG tablet Commonly known as: GLUCOPHAGE 500 mg (one tablet) in the morning and 1,000 mg (2 tablets) at night   metoprolol tartrate 25 MG tablet Commonly known as: LOPRESSOR Take 1 tablet (25 mg total) by mouth 2 (two) times daily.   multivitamin tablet Take 1 tablet by mouth daily.   Oyster Shell Calcium/D 250-125 MG-UNIT Tabs Take by mouth.   pantoprazole 40 MG tablet Commonly known as: PROTONIX Take 1 tablet (40 mg total) by mouth daily.        All past medical history, surgical history, allergies, family history, immunizations and medications were updated in the EMR today and reviewed under  the history and medication portions of their EMR.      ROS: Negative, with the exception of above mentioned in HPI   Objective:  BP 118/62   Pulse 99   Temp 98.3 F (36.8 C)   Wt 119 lb 6.4 oz (54.2 kg)   SpO2 94%   BMI 21.15 kg/m  Body mass index is 21.15 kg/m. Physical Exam Vitals and nursing note reviewed.  Constitutional:      General: She is not in acute distress.    Appearance: Normal appearance. She is not ill-appearing, toxic-appearing or diaphoretic.  HENT:     Head: Normocephalic and atraumatic.  Eyes:     General: No scleral icterus.       Right eye: No discharge.        Left eye: No discharge.     Extraocular Movements: Extraocular movements intact.     Conjunctiva/sclera: Conjunctivae normal.     Pupils: Pupils are equal, round, and reactive to light.  Cardiovascular:     Rate and Rhythm: Normal rate and regular rhythm.  Pulmonary:     Effort: Pulmonary effort is normal. No respiratory distress.     Breath sounds: Normal breath sounds. No wheezing, rhonchi or rales.  Musculoskeletal:     Right lower leg: No edema.     Left lower leg: No edema.  Skin:    General: Skin is warm.     Findings: No rash.  Neurological:     Mental Status: She is alert and oriented to person, place, and time. Mental status is at baseline.     Motor: No  weakness.     Gait: Gait normal.  Psychiatric:        Mood and Affect: Mood normal.        Behavior: Behavior normal.        Thought Content: Thought content normal.        Judgment: Judgment normal.    Assessment/Plan: Analyah Stutts is a 88 y.o. female present for OV for Hospital discharge follow up New onset a-fib (HCC)/History of recent hospitalization Patient reports she is doing very well. -Zio monitor was placed for her today by CMA. - Patient is compliant with metoprolol tartrate 25 mg twice daily - Patient is compliant with Eliquis 5 mg twice daily - Patient reports she feels well and at her baseline. -  Patient has cardiology follow-up January 7 to discuss admission and ZIO monitor results. - Follow-up as needed  Reviewed expectations re: course of current medical issues. Discussed self-management of symptoms. Outlined signs and symptoms indicating need for more acute intervention. Patient verbalized understanding and all questions were answered. Patient received an After-Visit Summary. Any changes in medications were reviewed and patient was provided with updated med list with their AVS.     No orders of the defined types were placed in this encounter.    Note is dictated utilizing voice recognition software. Although note has been proof read prior to signing, occasional typographical errors still can be missed. If any questions arise, please do not hesitate to call for verification.   electronically signed by:  Felix Pacini, DO  [facility] [place]

## 2023-05-31 NOTE — Patient Instructions (Addendum)

## 2023-06-18 NOTE — Progress Notes (Deleted)
 Cardiology Office Note:    Date:  06/18/2023   ID:  Melissa [name], DOB [birth year], MRN [id number]  PCP:  Natalia Leatherwood, DO  Cardiologist:  Nicki Guadalajara, MD { Click to update primary MD,subspecialty MD or APP then REFRESH:1}    Referring MD: Natalia Leatherwood, DO   Chief Complaint: hospital follow-up of new onset atrial fibrillation  History of Present Illness:    Melissa [name] is a 88 y.o. female with a history of paroxysmal atrial fibrillation on Eliquis, RBBB, moderate mitral stenosis, moderate to severe tricuspid regurgitation, bilateral PE in 02/2023 on Eliquis, hyperlipidemia, type 2 diabetes mellitus, and GERD who is followed by Dr. Tresa Endo and presents today for hospital follow-up of new onset atrial fibrillation.  Patient has a history of atypical nonexertional chest pain but has not been felt to be cardiac in the past.  She also has a history of palpitations and has been on Toprol-XL for this. She fractured her left humerus in 01/2023 after a mechanical fall. She was then admitted in 02/2023 for acute bilateral PE after presenting with shortness of breath. Bilateral lower extremity doppler and a left upper extremity doppler were negative for DVT. Echo during that admission showed LVEF of 60-65% with normal wall motion and mild LVH, normal RV size and function, and a small pericardial effusion. She was started on Eliquis.   She was last seen by Dr. Tresa Endo in 03/2023 at which time she was doing well with no recent shortness of breath or chest pain. Her Toprol-XL was increased slightly to 37.5mg  daily  due to hand tremors and heart rates.  She was recently admitted from 05/19/2023 to 05/21/2023 after presenting with nausea, vomiting, and diarrhea. She was found to be in new onset atrial fibrillation on arrival with rates in the 150s. Echo showed LVEF of 70-75% with normal wall motion and mild LVH, normal RV size and function, moderate mitral stenosis, and moderate to severe tricuspid  regurgitation. She converted back to normal sinus rhythm after a couple of dose of Lopressor. She was already on Eliquis for her recent PE. Outpatient monitor was ordered at discharge to assess for atrial fibrillation burden.   Patient presents today for follow-up. ***  Paroxysmal Atrial Fibrillation Patient was recently admitted in 04/2023 for new onset atrial fibrillation. She converted back to sinus rhythm after a couple of doses of a beta-blocker. Echo showed LVEF of 70-75%. Outpatient monitor showed *** - Maintaining sinus rhythm on EKG today. *** - Continue Lopressor 25mg  twice daily.  - Continue chronic anticoagulation with Eliquis. She is currently on 5mg  twice daily after recent PE in 02/2023. From an atrial fibrillation standpoint, patient meets criteria for reduced dose of 2.5mg  twice daily given age and weight. Will defer how long he needs treatment dose for PE to primary team/ PCP but once she completes this would decrease to 2.5mg  twice daily.   Moderate Mitral Stenosis Moderate to Severe Tricuspid Regurgitation Noted on recent Echo in 05/2023.  - Plan is for medical management given advanced age.   History of Bilateral PE Diagnosed in 02/2023.  - Currently on Eliquis 5mg  twice daily.  Hyperlipidemia Lipid pane lin 10/2022: Total Cholesterol 185, Triglycerides 108, HDL 74, LDL 89.  - Continue Lipitor 40mg  daily.   Type 2 Diabetes Mellitus Hemoglobin A1c 6.3% in 04/2023. - On Metformin. - Management per PCP.    EKGs/Labs/Other Studies Reviewed:    The following studies were reviewed:  Limited Echocardiogram 05/20/2023: Impressions: 1. Left  ventricular ejection fraction, by estimation, is 70 to 75%. The  left ventricle has hyperdynamic function. The left ventricle has no  regional wall motion abnormalities. There is mild left ventricular  hypertrophy. Left ventricular diastolic  parameters are indeterminate. Elevated left atrial pressure.   2. Right ventricular  systolic function is normal. The right ventricular  size is normal. There is normal pulmonary artery systolic pressure.   3. The mitral valve is abnormal. No evidence of mitral valve  regurgitation. Moderate mitral stenosis. The mean mitral valve gradient is  6.0 mmHg. HR 90 bpm. Severe mitral annular calcification.   4. Tricuspid valve regurgitation is moderate to severe.   5. The aortic valve has an indeterminant number of cusps. There is  moderate calcification of the aortic valve. There is moderate thickening  of the aortic valve. Aortic valve regurgitation is trivial. Aortic valve  sclerosis is present, with no evidence  of aortic valve stenosis.   6. There is Moderate (Grade III) atheroma plaque.   7. The inferior vena cava is normal in size with greater than 50%  respiratory variability, suggesting right atrial pressure of 3 mmHg.  _______________  Monitor ***:  EKG:  EKG ordered today. EKG personally reviewed and demonstrates ***.  Recent Labs: 02/25/2023: B Natriuretic Peptide 210.6 05/19/2023: TSH 1.561 05/21/2023: ALT 12; BUN 23; Creatinine, Ser 1.03; Hemoglobin 11.4; Magnesium 1.8; Platelets 294; Potassium 4.7; Sodium 137  Recent Lipid Panel    Component Value Date/Time   CHOL 185 11/09/2022 1017   CHOL 176 12/08/2017 1038   TRIG 108.0 11/09/2022 1017   HDL 74.40 11/09/2022 1017   HDL 67 12/08/2017 1038   CHOLHDL 2 11/09/2022 1017   VLDL 21.6 11/09/2022 1017   LDLCALC 89 11/09/2022 1017   LDLCALC 87 12/08/2017 1038   LDLDIRECT 112.6 01/17/2012 1051    Physical Exam:    Vital Signs: There were no vitals taken for this visit.    Wt Readings from Last 3 Encounters:  05/31/23 119 lb 6.4 oz (54.2 kg)  05/19/23 123 lb (55.8 kg)  05/09/23 121 lb 1.6 oz (54.9 kg)     General: 88 y.o. female in no acute distress. HEENT: Normocephalic and atraumatic. Sclera clear.  Neck: Supple. No carotid bruits. No JVD. Heart: *** RRR. Distinct S1 and S2. No murmurs, gallops, or  rubs.  Lungs: No increased work of breathing. Clear to ausculation bilaterally. No wheezes, rhonchi, or rales.  Abdomen: Soft, non-distended, and non-tender to palpation.  Extremities: No lower extremity edema.  Radial and distal pedal pulses 2+ and equal bilaterally. Skin: Warm and dry. Neuro: No focal deficits. Psych: Normal affect. Responds appropriately.   Assessment:    No diagnosis found.  Plan:     Disposition: Follow up in ***   Signed, Corrin Parker, PA-C  06/18/2023 8:43 PM    [facility]

## 2023-06-21 ENCOUNTER — Emergency Department (HOSPITAL_COMMUNITY): Payer: No Typology Code available for payment source

## 2023-06-21 ENCOUNTER — Emergency Department (HOSPITAL_COMMUNITY)
Admission: EM | Admit: 2023-06-21 | Discharge: 2023-06-21 | Disposition: A | Discharge status: Home / Self Care | Payer: No Typology Code available for payment source | Attending: Emergency Medicine | Admitting: Emergency Medicine

## 2023-06-21 ENCOUNTER — Other Ambulatory Visit: Payer: Self-pay

## 2023-06-21 DIAGNOSIS — I480 Paroxysmal atrial fibrillation: Secondary | ICD-10-CM | POA: Diagnosis not present

## 2023-06-21 DIAGNOSIS — R062 Wheezing: Secondary | ICD-10-CM | POA: Diagnosis not present

## 2023-06-21 DIAGNOSIS — R051 Acute cough: Secondary | ICD-10-CM | POA: Diagnosis not present

## 2023-06-21 DIAGNOSIS — R918 Other nonspecific abnormal finding of lung field: Secondary | ICD-10-CM | POA: Diagnosis not present

## 2023-06-21 DIAGNOSIS — R000 Tachycardia, unspecified: Secondary | ICD-10-CM | POA: Diagnosis not present

## 2023-06-21 DIAGNOSIS — R0600 Dyspnea, unspecified: Secondary | ICD-10-CM | POA: Diagnosis not present

## 2023-06-21 DIAGNOSIS — J069 Acute upper respiratory infection, unspecified: Secondary | ICD-10-CM | POA: Diagnosis not present

## 2023-06-21 DIAGNOSIS — K449 Diaphragmatic hernia without obstruction or gangrene: Secondary | ICD-10-CM | POA: Insufficient documentation

## 2023-06-21 DIAGNOSIS — I4891 Unspecified atrial fibrillation: Secondary | ICD-10-CM | POA: Diagnosis not present

## 2023-06-21 DIAGNOSIS — R059 Cough, unspecified: Secondary | ICD-10-CM | POA: Insufficient documentation

## 2023-06-21 DIAGNOSIS — Z7901 Long term (current) use of anticoagulants: Secondary | ICD-10-CM | POA: Insufficient documentation

## 2023-06-21 LAB — CBC
HCT: 36.8 % (ref 36.0–46.0)
Hemoglobin: 11.9 g/dL — ABNORMAL LOW (ref 12.0–15.0)
MCH: 29.8 pg (ref 26.0–34.0)
MCHC: 32.3 g/dL (ref 30.0–36.0)
MCV: 92.2 fL (ref 80.0–100.0)
Platelets: 292 10*3/uL (ref 150–400)
RBC: 3.99 MIL/uL (ref 3.87–5.11)
RDW: 14.5 % (ref 11.5–15.5)
WBC: 5.6 10*3/uL (ref 4.0–10.5)
nRBC: 0 % (ref 0.0–0.2)

## 2023-06-21 LAB — BASIC METABOLIC PANEL
Anion gap: 12 (ref 5–15)
BUN: 22 mg/dL (ref 8–23)
CO2: 21 mmol/L — ABNORMAL LOW (ref 22–32)
Calcium: 9.6 mg/dL (ref 8.9–10.3)
Chloride: 108 mmol/L (ref 98–111)
Creatinine, Ser: 1.11 mg/dL — ABNORMAL HIGH (ref 0.44–1.00)
GFR, Estimated: 48 mL/min — ABNORMAL LOW (ref >60)
Glucose, Bld: 109 mg/dL — ABNORMAL HIGH (ref 70–99)
Potassium: 4.1 mmol/L (ref 3.5–5.1)
Sodium: 141 mmol/L (ref 135–145)

## 2023-06-21 LAB — BRAIN NATRIURETIC PEPTIDE: B Natriuretic Peptide: 211.9 pg/mL — ABNORMAL HIGH (ref 0.0–100.0)

## 2023-06-21 LAB — TROPONIN I (HIGH SENSITIVITY): Troponin I (High Sensitivity): 11 ng/L (ref <18)

## 2023-06-21 MED ORDER — METOPROLOL TARTRATE 5 MG/5ML IV SOLN
2.5000 mg | Freq: Once | INTRAVENOUS | Status: AC
  Administered 2023-06-21: 2.5 mg via INTRAVENOUS
  Filled 2023-06-21: qty 5

## 2023-06-21 NOTE — ED Triage Notes (Signed)
 Patient arrives from PCP for elevated heart rate. Hx afib and family reports possibly not taking Metoprolol as prescribed. Sob, congestion, and cough x 2 days.

## 2023-06-21 NOTE — Discharge Instructions (Addendum)
 It was our pleasure to provide your ER care today - we hope that you feel better.  Drink adequate fluids/stay well hydrated.   Continue metoprolol .   Follow up closely with your cardiologist in the coming week.   Return to ER if worse, new symptoms, fevers, chest pain, increased trouble breathing, or other concern.

## 2023-06-21 NOTE — ED Notes (Signed)
 ED Provider at bedside.

## 2023-06-21 NOTE — ED Provider Notes (Signed)
[place] [facility] AT [facility] Provider Note   CSN: [id number] Arrival date & time: 06/21/23  1344     History  Chief Complaint  Patient presents with   Atrial Fibrillation    Melissa [name] is a 89 y.o. female.  Patient with c/o non prod cough, nasal congestion, and indicates was told by urgent care to come to ED for evaluation. Pt had covid and flu test there which was negative. Pt denies sore throat. Denies sob. No chest pain or discomfort. Hx afib. Indicates compliant w home meds. Denies fever or chills. No specific known ill contacts. No leg pain or swelling.   The history is provided by the patient, medical records and a relative.  Atrial Fibrillation Pertinent negatives include no chest pain, no abdominal pain, no headaches and no shortness of breath.       Home Medications Prior to Admission medications   Medication Sig Start Date End Date Taking? Authorizing Provider  albuterol  (VENTOLIN  HFA) 108 (90 Base) MCG/ACT inhaler Inhale 2 puffs into the lungs every 6 (six) hours as needed for wheezing or shortness of breath. 08/19/22   Kuneff, Renee A, DO  albuterol  (VENTOLIN  HFA) 108 (90 Base) MCG/ACT inhaler Inhale 2 puffs into the lungs every 4 (four) hours as needed for wheezing or shortness of breath. 05/21/23   Cindy Garnette POUR, MD  apixaban  (ELIQUIS ) 5 MG TABS tablet Take 1 tablet (5 mg total) by mouth 2 (two) times daily. 04/05/23   Kuneff, Renee A, DO  atorvastatin  (LIPITOR) 40 MG tablet Take 1 tablet (40 mg total) by mouth daily. 04/26/23   Kuneff, Renee A, DO  Calcium  Carbonate-Vitamin D  (OYSTER SHELL CALCIUM /D) 250-125 MG-UNIT TABS Take by mouth.    [provider]  escitalopram  (LEXAPRO ) 5 MG tablet Take 1 tablet (5 mg total) by mouth daily. 04/26/23   Kuneff, Renee A, DO  gabapentin  (NEURONTIN ) 300 MG capsule Take 1 capsule (300 mg total) by mouth at bedtime. 04/26/23   Kuneff, Renee A, DO  metFORMIN  (GLUCOPHAGE ) 500 MG tablet 500 mg  (one tablet) in the morning and 1,000 mg (2 tablets) at night 04/26/23   Kuneff, Renee A, DO  metoprolol  tartrate (LOPRESSOR ) 25 MG tablet Take 1 tablet (25 mg total) by mouth 2 (two) times daily. 05/31/23 06/30/23  Kuneff, Renee A, DO  Multiple Vitamin (MULTIVITAMIN) tablet Take 1 tablet by mouth daily.    [provider]  pantoprazole  (PROTONIX ) 40 MG tablet Take 1 tablet (40 mg total) by mouth daily. 11/09/22   Kuneff, Renee A, DO      Allergies    Patient has no known allergies.    Review of Systems   Review of Systems  Constitutional:  Negative for chills and fever.  HENT:  Positive for congestion and rhinorrhea. Negative for sinus pain, sore throat and trouble swallowing.   Eyes:  Negative for redness.  Respiratory:  Positive for cough. Negative for shortness of breath.   Cardiovascular:  Negative for chest pain, palpitations and leg swelling.  Gastrointestinal:  Negative for abdominal pain, diarrhea and vomiting.  Genitourinary:  Negative for dysuria and flank pain.  Musculoskeletal:  Negative for back pain and neck pain.  Skin:  Negative for rash.  Neurological:  Negative for light-headedness and headaches.    Physical Exam Updated Vital Signs BP (!) 120/54   Pulse 91   Temp 98.2 F (36.8 C) (Oral)   Resp (!) 22   SpO2 96%  Physical Exam Vitals  and nursing note reviewed.  Constitutional:      Appearance: Normal appearance. She is well-developed.  HENT:     Head: Atraumatic.     Nose: Congestion present.     Mouth/Throat:     Mouth: Mucous membranes are moist.     Pharynx: Oropharynx is clear. No oropharyngeal exudate or posterior oropharyngeal erythema.  Eyes:     General: No scleral icterus.    Conjunctiva/sclera: Conjunctivae normal.     Pupils: Pupils are equal, round, and reactive to light.  Neck:     Trachea: No tracheal deviation.     Comments: No stiffness or rigidity.  Cardiovascular:     Rate and Rhythm: Regular rhythm. Tachycardia present.      Pulses: Normal pulses.     Heart sounds: Normal heart sounds. No murmur heard.    No friction rub. No gallop.     Comments: HR 104 currently.  Pulmonary:     Effort: Pulmonary effort is normal. No respiratory distress.     Breath sounds: Normal breath sounds.  Abdominal:     General: Bowel sounds are normal. There is no distension.     Palpations: Abdomen is soft.     Tenderness: There is no abdominal tenderness.  Genitourinary:    Comments: No cva tenderness.  Musculoskeletal:        General: No swelling or tenderness.     Cervical back: Normal range of motion and neck supple. No rigidity. No muscular tenderness.     Right lower leg: No edema.     Left lower leg: No edema.  Lymphadenopathy:     Cervical: No cervical adenopathy.  Skin:    General: Skin is warm and dry.     Findings: No rash.  Neurological:     Mental Status: She is alert.     Comments: Alert, speech normal. Motor/sens grossly intact bil.   Psychiatric:        Mood and Affect: Mood normal.     ED Results / Procedures / Treatments   Labs (all labs ordered are listed, but only abnormal results are displayed) Results for orders placed or performed during the hospital encounter of 06/21/23  CBC   Collection Time: 06/21/23  2:24 PM  Result Value Ref Range   WBC 5.6 4.0 - 10.5 K/uL   RBC 3.99 3.87 - 5.11 MIL/uL   Hemoglobin 11.9 (L) 12.0 - 15.0 g/dL   HCT 63.1 63.9 - 53.9 %   MCV 92.2 80.0 - 100.0 fL   MCH 29.8 26.0 - 34.0 pg   MCHC 32.3 30.0 - 36.0 g/dL   RDW 85.4 88.4 - 84.4 %   Platelets 292 150 - 400 K/uL   nRBC 0.0 0.0 - 0.2 %  Basic metabolic panel   Collection Time: 06/21/23  2:24 PM  Result Value Ref Range   Sodium 141 135 - 145 mmol/L   Potassium 4.1 3.5 - 5.1 mmol/L   Chloride 108 98 - 111 mmol/L   CO2 21 (L) 22 - 32 mmol/L   Glucose, Bld 109 (H) 70 - 99 mg/dL   BUN 22 8 - 23 mg/dL   Creatinine, Ser 8.88 (H) 0.44 - 1.00 mg/dL   Calcium  9.6 8.9 - 10.3 mg/dL   GFR, Estimated 48 (L) >60  mL/min   Anion gap 12 5 - 15  Brain natriuretic peptide   Collection Time: 06/21/23  5:55 PM  Result Value Ref Range   B Natriuretic Peptide 211.9 (H) 0.0 -  100.0 pg/mL  Troponin I (High Sensitivity)   Collection Time: 06/21/23  5:55 PM  Result Value Ref Range   Troponin I (High Sensitivity) 11 <18 ng/L     EKG EKG Interpretation Date/Time:  Wednesday June 21 2023 19:30:23 EST Ventricular Rate:  90 PR Interval:  132 QRS Duration:  147 QT Interval:  407 QTC Calculation: 498 R Axis:   136  Text Interpretation: Sinus rhythm RBBB and LPFB Confirmed by Bernard Drivers ([id number]) on 06/21/2023 8:06:22 PM  Radiology DG Chest 2 View Result Date: 06/21/2023 CLINICAL DATA:  Dyspnea and atrial fibrillation EXAM: CHEST - 2 VIEW COMPARISON:  05/19/2023 chest radiograph. FINDINGS: Stable cardiomediastinal silhouette with normal heart size and large hiatal hernia. No pneumothorax. No pleural effusion. No pulmonary edema. Mild left basilar scarring versus atelectasis. No acute consolidative airspace disease. IMPRESSION: 1. Mild left basilar scarring versus atelectasis. 2. Large hiatal hernia. Electronically Signed   By: Selinda DELENA Blue M.D.   On: 06/21/2023 15:38    Procedures Procedures    Medications Ordered in ED Medications  metoprolol  tartrate (LOPRESSOR ) injection 2.5 mg (2.5 mg Intravenous Given 06/21/23 1925)    ED Course/ Medical Decision Making/ A&P                                 Medical Decision Making Problems Addressed: Paroxysmal atrial fibrillation [facility]): chronic illness or injury with exacerbation, progression, or side effects of treatment that poses a threat to life or bodily functions Tachycardia: acute illness or injury with systemic symptoms that poses a threat to life or bodily functions  Amount and/or Complexity of Data Reviewed Independent Historian:     Details: Family, hx External Data Reviewed: notes. Labs: ordered. Decision-making details documented in ED  Course. Radiology: ordered and independent interpretation performed. Decision-making details documented in ED Course. ECG/medicine tests: ordered and independent interpretation performed. Decision-making details documented in ED Course. Discussion of management or test interpretation with external provider(s): cardiology  Risk Prescription drug management. Decision regarding hospitalization.   Iv ns. Continuous pulse ox and cardiac monitoring. Labs ordered/sent. Imaging ordered.   Differential diagnosis includes viral uri, pna, chf, etc. Dispo decision including potential need for admission considered - will get labs and imaging and reassess.   Reviewed nursing notes and prior charts for additional history. External reports reviewed. Additional history from: fam. Covid and flu at pcp office neg. Strep neg.   Cardiac monitor: sinus rhythm, rate 102/  Po fluids.   Labs reviewed/interpreted by me - wbc normal. Hct normal. K normal. Bnp sl elev, similar to prior. Trop normal.   Xrays reviewed/interpreted by me - no pna.   Cardiology consulted re concern possible a flutter on initial ecg. Discussed with cardiologist on call (Branch) - he reviewed ecg, indicates could give extra dose of metoprolol , 2.5 mg iv, and d/c to home to have f/u in afib clinic, indicates would not cardiovert in ED given rate/vitals.   Metoprolol  iv.   Recheck hr 88, sinus. No chest pain or sob. No increased wob.   Pt currently appears stable for ed d/c w close outpatient cardiology f/u.   Rec close cardiology f/u.  Return precautions provided.          Final Clinical Impression(s) / ED Diagnoses Final diagnoses:  None    Rx / DC Orders ED Discharge Orders     None         Bernard Drivers, MD 06/21/23  2011  

## 2023-06-22 ENCOUNTER — Other Ambulatory Visit: Payer: Self-pay

## 2023-06-22 NOTE — Telephone Encounter (Signed)
 [facility] is requesting a refill on metoprolol  25 mg tablets.Dr. Burnard decrease medication at last office, but pt' PCP increased medication back to pt taking BID. Would Dr. Burnard like to refill this medication with pt taking Metoprolol  25 mg tablets BID? Please address

## 2023-06-23 ENCOUNTER — Other Ambulatory Visit: Payer: Self-pay | Admitting: Family Medicine

## 2023-06-23 ENCOUNTER — Telehealth: Payer: Self-pay | Admitting: Cardiovascular Disease

## 2023-06-23 DIAGNOSIS — I4891 Unspecified atrial fibrillation: Secondary | ICD-10-CM | POA: Diagnosis not present

## 2023-06-23 MED ORDER — METOPROLOL TARTRATE 25 MG PO TABS: 25.0000 mg | ORAL_TABLET | Freq: Two times a day (BID) | ORAL | 0 refills | Status: DC

## 2023-06-23 NOTE — Telephone Encounter (Signed)
 Irthythm with abnormal zio results

## 2023-06-23 NOTE — Telephone Encounter (Signed)
 Copied from CRM [id number]. Topic: Clinical - Medication Refill >> Jun 23, 2023  9:19 AM Curlee DEL wrote: Most Recent Primary Care Visit:  Provider: CATHERINE FULLER A  Department: [facility]  Visit Type: HOSPITAL FU  Date: 05/31/2023  Medication: metoprolol  tartrate (LOPRESSOR ) 25 MG tablet  Has the patient contacted their pharmacy? Yes, no refills left. (Agent: If no, request that the patient contact the pharmacy for the refill. If patient does not wish to contact the pharmacy document the reason why and proceed with request.) (Agent: If yes, when and what did the pharmacy advise?)  Is this the correct pharmacy for this prescription? Yes If no, delete pharmacy and type the correct one.  This is the patient's preferred pharmacy:  [facility] [place], [place] - [address] [place] [address] Phone: [phone] Fax: [phone]   Has the prescription been filled recently? No  Is the patient out of the medication? No  Has the patient been seen for an appointment in the last year OR does the patient have an upcoming appointment? Yes  Can we respond through MyChart? Yes  Agent: Please be advised that Rx refills may take up to 3 business days. We ask that you follow-up with your pharmacy.

## 2023-06-23 NOTE — Telephone Encounter (Signed)
   Cardiac Monitor Alert  Date of alert:  06/23/2023   Patient Name: Melissa [name]  DOB: [birth year]  MRN: [id number]   [facility] Cardiologist: Debby Sor, MD  [facility] EP:  None    Monitor Information: Long Term Monitor [ZioXT]  Reason:  Afib Ordering provider:  Aline Door   Alert Atrial Fibrillation/Flutter This is the 1st alert for this rhythm.  The patient has a hx of Atrial Fibrillation/Flutter.  The patient is not currently on anticoagulation.  Anticoagulation medication as of 06/23/2023           apixaban  (ELIQUIS ) 5 MG TABS tablet Take 1 tablet (5 mg total) by mouth 2 (two) times daily.       Next Cardiology Appointment   Date:  07/11/23  Provider:  Lamarr Satterfield, NP  The patient could NOT be reached by telephone today.         Other: Patient noted on 12/11 at 5:46 pm to have Rapid Afib 183 bpm for 60 seconds.  Spoke to Melissa [name] no changes. Will send message to Melissa [name] and Melissa [name].   Melissa Klutts, RN  06/23/2023 12:26 PM

## 2023-06-23 NOTE — Telephone Encounter (Signed)
 Rx sent

## 2023-06-26 NOTE — Telephone Encounter (Signed)
 Thank you. Patient is already on Metoprolol and Eliquis. No new recommendations. She should keep her follow-up visit with Joni Reining, NP, on 07/11/2023   Thank you!

## 2023-06-27 ENCOUNTER — Ambulatory Visit: Payer: No Typology Code available for payment source | Admitting: Student

## 2023-06-29 ENCOUNTER — Telehealth: Payer: Self-pay | Admitting: Cardiovascular Disease

## 2023-06-29 MED ORDER — METOPROLOL SUCCINATE ER 50 MG PO TB24: 50.0000 mg | ORAL_TABLET | Freq: Every day | ORAL | 3 refills | Status: DC

## 2023-06-29 NOTE — Telephone Encounter (Signed)
 Patient identification verified by 2 forms. Melissa Cooks, RN    Called and spoke to patient  Informed patient:   -Dr. Burnard recommends taking Metoprolol  Succinate 50mg  daily   -Take Metoprolol  Tartrate 25mg  PRN for palpitations   -New Rx for Metoprolol  Succinate sent to pharmacy  Patient used Teach-back, repeated/reviewed instructions  Patient confirmed she only has Rx for Metoprolol  Tartrate 25mg  at home, no others  Reviewed ED warning signs/precautions  Advised patient to keep 1/21 OV with NP Jerilynn for follow up  Patient verbalized understanding will pick up Rx today, no questions at this time

## 2023-06-29 NOTE — Telephone Encounter (Signed)
 RN spoke to Dr. Tresa Endo   -Increase Metoprolol Succinate 50mg  daily   -Takes Metoprolol Tartrate 25mg  PRN fotr palpitations

## 2023-06-29 NOTE — Telephone Encounter (Signed)
 Pt c/o medication issue:  1. Name of Medication:   metoprolol  tartrate (LOPRESSOR ) 25 MG tablet    2. How are you currently taking this medication (dosage and times per day)? As written   3. Are you having a reaction (difficulty breathing--STAT)? No    4. What is your medication issue? Pt called stating someone called to inform her that Dr. Catherine increased her medication but she thought it was the provider in the ED who changed it. She states she has been taking 2 daily she just wants to clarify that this is how Dr. Burnard would like for her to be taking it.

## 2023-07-09 NOTE — Progress Notes (Unsigned)
Cardiology Office Note:  .   Date:  07/11/2023  ID:  Melissa [name], DOB 12/10/1934, MRN [id number] PCP: Natalia Leatherwood, DO  [facility] Providers Cardiologist:  Nicki Guadalajara, MD  }   History of Present Illness: Melissa [name]   Melissa [name] is a 89 y.o. female we are following for new onset atrial fibrillation with history of PE on Eliquis, type 2 diabetes, GERD, depression, hyperlipidemia and hypertension.  She was seen in the ED during admission on 05/21/2023 and 100 after having eaten at a [employer] and had felt unwell since then.  She had nausea with no appetite.  She had some nausea and vomiting.    She was seen by cardiology who recommended increasing metoprolol to tartrate to 25 mg twice daily with 2-week event monitor after discharge and to continue Eliquis.  She was also diagnosed with upper respiratory infection.  ZIO monitor revealed predominantly normal sinus rhythm with some intermittent atrial fibrillation (burden less than 1%), and also had a couple of episodes of SVT only lasting 17 seconds.  She was to continue taking her increased dose of Toprol.  She is here for ongoing evaluation of her current status  She comes today without any cardiac complaints and is tolerating the Toprol very well.  She states if she is doing a lot of work around her house she does feel her heart rate go up some and she sits down and it gets back to normal.  She denies any chest pain dyspnea on exertion dizziness or fatigue over and above normal for her.  She is medically compliant.  She denies any bleeding on Eliquis.  ROS: As above otherwise negative  Studies Reviewed: .    Echocardiogram 05/20/2023    1. Left ventricular ejection fraction, by estimation, is 70 to 75%. The  left ventricle has hyperdynamic function. The left ventricle has no  regional wall motion abnormalities. There is mild left ventricular  hypertrophy. Left ventricular diastolic  parameters are indeterminate.  Elevated left atrial pressure.   2. Right ventricular systolic function is normal. The right ventricular  size is normal. There is normal pulmonary artery systolic pressure.   3. The mitral valve is abnormal. No evidence of mitral valve  regurgitation. Moderate mitral stenosis. The mean mitral valve gradient is  6.0 mmHg. HR 90 bpm. Severe mitral annular calcification.   4. Tricuspid valve regurgitation is moderate to severe.   5. The aortic valve has an indeterminant number of cusps. There is  moderate calcification of the aortic valve. There is moderate thickening  of the aortic valve. Aortic valve regurgitation is trivial. Aortic valve  sclerosis is present, with no evidence  of aortic valve stenosis.   6. There is Moderate (Grade III) atheroma plaque.   7. The inferior vena cava is normal in size with greater than 50%  respiratory variability, suggesting right atrial pressure of 3 mmHg.   8. Limited echo in setting of new onset afib     Physical Exam:   VS:  BP 130/60 (BP Location: Left Arm, Patient Position: Sitting, Cuff Size: Normal)   Pulse 77   Ht 5\' 3"  (1.6 m)   Wt 121 lb (54.9 kg)   SpO2 98%   BMI 21.43 kg/m    Wt Readings from Last 3 Encounters:  07/11/23 121 lb (54.9 kg)  05/31/23 119 lb 6.4 oz (54.2 kg)  05/19/23 123 lb (55.8 kg)    GEN: Well nourished, well developed in no acute  distress NECK: No JVD; No carotid bruits CARDIAC: RRR, soft systolic murmurs, rubs, gallops RESPIRATORY:  Clear to auscultation without rales, wheezing or rhonchi  ABDOMEN: Soft, non-tender, non-distended EXTREMITIES:  No edema; No deformity   ASSESSMENT AND PLAN: .    Paroxysmal atrial fibrillation: She has not been experiencing any rapid heart rhythms or palpitations since leaving the hospital.  She does state that sometimes when she is cleaning her house her heart rate will go up and when she sits down it returns to normal.  She denies any bleeding on Eliquis.  Continue the metoprolol  50 mg twice daily as directed.  2.  Hypercholesterolemia: Continue atorvastatin 40 mg daily as directed.  Labs are followed by primary care.  3.  Hypertension: Excellent control of blood pressure today.  Continue her on the metoprolol as directed.  She is not on any other antihypertensive medications.       Patient would like to be established with a cardiologist who goes to [facility] as Dr. Tresa Endo is retiring.  She will be scheduled with Dr. Antoine Poche in 6 months.  Signed, Bettey Mare. Liborio Nixon, ANP, AACC

## 2023-07-10 ENCOUNTER — Telehealth: Payer: Self-pay | Admitting: Cardiovascular Disease

## 2023-07-10 NOTE — Telephone Encounter (Signed)
 Returning call to nurse for results

## 2023-07-10 NOTE — Telephone Encounter (Signed)
Called and spoke to patient. Verified name and DOB. Below message relayed to patient. No questions at this time. Patient stated she plan on keeping her appt tomorrow weather permitting.  Melissa Parker, PA-C 07/03/2023  2:49 PM EST   Please notify patient of results: Monitor showed predominantly normal rhythm with some intermittent atrial fibrillation (burden 1%). The longest episode of atrial fibrillation occurred on 05/31/2023 and lasted for 1 hour and 13 minutes. She also had a couple of episodes of SVT (another fast heart rhythm coming from the top of the heart) but longest episode only lasted 17 seconds. It looks like her Toprol-XL was recently increased to 50mg  daily. Can continue this dose. Patient should keep her follow-up visit with Joni Reining, NP, on 07/11/2023.   Thank you!

## 2023-07-11 ENCOUNTER — Encounter: Payer: Self-pay | Admitting: Adult Health

## 2023-07-11 ENCOUNTER — Ambulatory Visit: Payer: No Typology Code available for payment source | Attending: Adult Health | Admitting: Adult Health

## 2023-07-11 VITALS — BP 130/60 | HR 77 | Ht 63.0 in | Wt 121.0 lb

## 2023-07-11 DIAGNOSIS — I480 Paroxysmal atrial fibrillation: Secondary | ICD-10-CM

## 2023-07-11 DIAGNOSIS — E7800 Pure hypercholesterolemia, unspecified: Secondary | ICD-10-CM | POA: Diagnosis not present

## 2023-07-11 DIAGNOSIS — I10 Essential (primary) hypertension: Secondary | ICD-10-CM | POA: Diagnosis not present

## 2023-07-11 NOTE — Patient Instructions (Signed)
Medication Instructions:  No Changes *If you need a refill on your cardiac medications before your next appointment, please call your pharmacy*   Lab Work: labs If you have labs (blood work) drawn today and your tests are completely normal, you will receive your results only by: MyChart Message (if you have MyChart) OR A paper copy in the mail If you have any lab test that is abnormal or we need to change your treatment, we will call you to review the results.   Testing/Procedures: No Testing   Follow-Up: At [facility], you and your health needs are our priority.  As part of our continuing mission to provide you with exceptional heart care, we have created designated Provider Care Teams.  These Care Teams include your primary Cardiologist (physician) and Advanced Practice Providers (APPs -  Physician Assistants and Nurse Practitioners) who all work together to provide you with the care you need, when you need it.  We recommend signing up for the patient portal called "MyChart".  Sign up information is provided on this After Visit Summary.  MyChart is used to connect with patients for Virtual Visits (Telemedicine).  Patients are able to view lab/test results, encounter notes, upcoming appointments, etc.  Non-urgent messages can be sent to your provider as well.   To learn more about what you can do with MyChart, go to [email or web address].    Your next appointment:   6 month(s)  Provider:   Rollene Rotunda, MD

## 2023-07-13 ENCOUNTER — Other Ambulatory Visit: Payer: Self-pay | Admitting: Family Medicine

## 2023-07-13 DIAGNOSIS — I2699 Other pulmonary embolism without acute cor pulmonale: Secondary | ICD-10-CM

## 2023-07-17 ENCOUNTER — Other Ambulatory Visit: Payer: Self-pay | Admitting: Cardiovascular Disease

## 2023-07-17 DIAGNOSIS — I2699 Other pulmonary embolism without acute cor pulmonale: Secondary | ICD-10-CM

## 2023-07-17 MED ORDER — APIXABAN 5 MG PO TABS: 5.0000 mg | ORAL_TABLET | Freq: Two times a day (BID) | ORAL | 5 refills | Status: DC

## 2023-07-17 MED ORDER — METOPROLOL SUCCINATE ER 50 MG PO TB24: 50.0000 mg | ORAL_TABLET | Freq: Every day | ORAL | 3 refills | Status: AC

## 2023-07-17 NOTE — Telephone Encounter (Signed)
*  STAT* If patient is at the pharmacy, call can be transferred to refill team.   1. Which medications need to be refilled? (please list name of each medication and dose if known)  ELIQUIS 5 MG TABS tablet    metoprolol succinate (TOPROL-XL) 50 MG 24 hr tablet  2. Which pharmacy/location (including street and city if local pharmacy) is medication to be sent to? [facility] -  [address], [place], [place] [address]  3. Do they need a 30 day or 90 day supply?   90 day supply  Patient is requesting to have her prescriptions transferred to [facility]. She states her currently pharmacy no longer takes her insurance.

## 2023-07-17 NOTE — Telephone Encounter (Signed)
Prescription refill request for Eliquis received. Indication: PAF Last office visit: 07/11/23  Harriet Pho NP Scr: 1.11 on 06/21/23  Epic Age: 89 Weight: 54.9kg  Based on above findings Eliquis 5mg  twice daily is the appropriate dose.  Refill approved.

## 2023-07-24 ENCOUNTER — Telehealth: Payer: Self-pay | Admitting: Cardiovascular Disease

## 2023-07-24 NOTE — Telephone Encounter (Signed)
Pt c/o medication issue:  1. Name of Medication: apixaban (ELIQUIS) 5 MG TABS tablet   2. How are you currently taking this medication (dosage and times per day)?   Take 1 tablet (5 mg total) by mouth 2 (two) times daily.    3. Are you having a reaction (difficulty breathing--STAT)? No  4. What is your medication issue? Pt would like to know if there is an alternate medication that she can take being that the cost has increased and being in the donut hole. Please advise

## 2023-07-24 NOTE — Telephone Encounter (Signed)
Called and spoke to patient. Verified name and DOB. Patient called about changing her Eliquis to something less expensive. She report when she went to pick up her medication it was over $500. Advised patient to reach out to her insurance company to find out her deductible and monthly payment after deductible is met. Patient verbalized understanding and agree.

## 2023-08-22 ENCOUNTER — Other Ambulatory Visit: Payer: Self-pay | Admitting: Family Medicine

## 2023-08-22 DIAGNOSIS — K219 Gastro-esophageal reflux disease without esophagitis: Secondary | ICD-10-CM

## 2023-08-28 ENCOUNTER — Other Ambulatory Visit: Payer: Self-pay | Admitting: Family Medicine

## 2023-08-28 DIAGNOSIS — K219 Gastro-esophageal reflux disease without esophagitis: Secondary | ICD-10-CM

## 2023-08-30 ENCOUNTER — Other Ambulatory Visit: Payer: Self-pay | Admitting: Oncology

## 2023-08-30 DIAGNOSIS — I2699 Other pulmonary embolism without acute cor pulmonale: Secondary | ICD-10-CM

## 2023-09-05 ENCOUNTER — Inpatient Hospital Stay: Payer: Self-pay

## 2023-09-05 ENCOUNTER — Encounter: Payer: Self-pay | Admitting: Oncology

## 2023-09-05 ENCOUNTER — Other Ambulatory Visit: Payer: No Typology Code available for payment source

## 2023-09-05 ENCOUNTER — Inpatient Hospital Stay: Payer: Self-pay | Attending: Oncology | Admitting: Oncology

## 2023-09-05 ENCOUNTER — Telehealth: Payer: Self-pay | Admitting: Oncology

## 2023-09-05 VITALS — BP 129/64 | HR 84 | Temp 98.1°F | Resp 18 | Ht 63.0 in | Wt 117.2 lb

## 2023-09-05 DIAGNOSIS — R296 Repeated falls: Secondary | ICD-10-CM | POA: Diagnosis not present

## 2023-09-05 DIAGNOSIS — Z79899 Other long term (current) drug therapy: Secondary | ICD-10-CM | POA: Insufficient documentation

## 2023-09-05 DIAGNOSIS — Z9181 History of falling: Secondary | ICD-10-CM | POA: Diagnosis not present

## 2023-09-05 DIAGNOSIS — Z86711 Personal history of pulmonary embolism: Secondary | ICD-10-CM | POA: Insufficient documentation

## 2023-09-05 DIAGNOSIS — I2699 Other pulmonary embolism without acute cor pulmonale: Secondary | ICD-10-CM

## 2023-09-05 LAB — CBC WITH DIFFERENTIAL (CANCER CENTER ONLY)
Abs Immature Granulocytes: 0.01 10*3/uL (ref 0.00–0.07)
Basophils Absolute: 0 10*3/uL (ref 0.0–0.1)
Basophils Relative: 1 %
Eosinophils Absolute: 0.1 10*3/uL (ref 0.0–0.5)
Eosinophils Relative: 3 %
HCT: 37.4 % (ref 36.0–46.0)
Hemoglobin: 12 g/dL (ref 12.0–15.0)
Immature Granulocytes: 0 %
Lymphocytes Relative: 23 %
Lymphs Abs: 1.1 10*3/uL (ref 0.7–4.0)
MCH: 30 pg (ref 26.0–34.0)
MCHC: 32.1 g/dL (ref 30.0–36.0)
MCV: 93.5 fL (ref 80.0–100.0)
Monocytes Absolute: 0.5 10*3/uL (ref 0.1–1.0)
Monocytes Relative: 10 %
Neutro Abs: 3.2 10*3/uL (ref 1.7–7.7)
Neutrophils Relative %: 63 %
Platelet Count: 291 10*3/uL (ref 150–400)
RBC: 4 MIL/uL (ref 3.87–5.11)
RDW: 14.4 % (ref 11.5–15.5)
WBC Count: 5 10*3/uL (ref 4.0–10.5)
nRBC: 0 % (ref 0.0–0.2)

## 2023-09-05 LAB — D-DIMER, QUANTITATIVE: D-Dimer, Quant: 0.38 ug{FEU}/mL (ref 0.00–0.50)

## 2023-09-05 MED ORDER — APIXABAN 2.5 MG PO TABS: 2.5000 mg | ORAL_TABLET | Freq: Two times a day (BID) | ORAL | 3 refills | Status: DC

## 2023-09-05 MED ORDER — APIXABAN 2.5 MG PO TABS: 2.5000 mg | ORAL_TABLET | Freq: Two times a day (BID) | ORAL | 5 refills | Status: DC

## 2023-09-05 NOTE — Assessment & Plan Note (Signed)
-   Bilateral pulmonary emboli diagnosed in September 2024. No recent surgeries, long trips, or immobilization. No current symptoms of chest pain or shortness of breath. On Eliquis 5 mg twice daily without any reported side effects or bleeding.  -Given bilateral pulmonary emboli, advised anticoagulation for minimum of 6 months.  -Based on history, pulmonary emboli seems to be unprovoked.   Hence we obtained thrombophilia workup on her initial consultation with [place] on 05/09/2023.  Prothrombin gene mutation, factor V Leiden mutation, beta-2 glycoprotein antibodies, anticardiolipin antibodies were all negative.  D-dimer was 0.32.  Rest of the hypercoagulable workup was deferred given the fact that she had recent thromboembolism and current Eliquis use, which can result in falsely abnormal labs.  Today we did check protein C activity, protein S activity, Antithrombin III activity, lupus anticoagulant, to complete workup.  Will follow-up on the results.  D-dimer remains low at 0.38.  Since she completed 6 months of anticoagulation, discussed discontinuing anticoagulation completely versus changing to prophylactic dose.  Since she had an unprovoked pulmonary emboli, patient opted to remain on prophylactic dose of Eliquis.  Eliquis was dose reduced to prophylactic dosing of 2.5 mg p.o. twice daily starting from today.  RTC in 6 months for follow-up.

## 2023-09-05 NOTE — Assessment & Plan Note (Signed)
 History of falls, lives alone. On anticoagulation, increasing risk of bleeding with falls. -Advised to be cautious to prevent falls, especially while on anticoagulation.  She was advised to use cane/walker for ambulation

## 2023-09-05 NOTE — Telephone Encounter (Signed)
 Spoke with patient confirming upcoming appointment

## 2023-09-05 NOTE — Progress Notes (Signed)
 [facility]  HEMATOLOGY CLINIC PROGRESS NOTE  PATIENT NAME: Melissa [name]   MR#: [id number] DOB: Nov 21, 1934  Patient Care Team: Natalia Leatherwood, DO as PCP - General (Family Medicine) Lennette Bihari, MD as PCP - Cardiology (Cardiology) Luciana Axe Alford Highland, MD as Consulting Physician (Ophthalmology) Allie Bossier, MD as Consulting Physician (Obstetrics and Gynecology) Lennette Bihari, MD as Consulting Physician (Cardiology) Patton Salles, MD as Consulting Physician (Obstetrics and Gynecology) Ponciano Ort, [facility]  Date of visit: 09/05/2023   ASSESSMENT & PLAN:   Melissa [name] is a 89 y.o. lady with a past medical history of type 2 diabetes mellitus, dyslipidemia, history of shoulder fracture, was referred to our service for management decisions regarding anticoagulation, given her history of pulmonary embolism diagnosed in September 2024.  Grossly negative hypercoagulable workup.  Hx pulmonary embolism - Bilateral pulmonary emboli diagnosed in September 2024. No recent surgeries, long trips, or immobilization. No current symptoms of chest pain or shortness of breath. On Eliquis 5 mg twice daily without any reported side effects or bleeding.  -Given bilateral pulmonary emboli, advised anticoagulation for minimum of 6 months.  -Based on history, pulmonary emboli seems to be unprovoked.   Hence we obtained thrombophilia workup on her initial consultation with [place] on 05/09/2023.  Prothrombin gene mutation, factor V Leiden mutation, beta-2 glycoprotein antibodies, anticardiolipin antibodies were all negative.  D-dimer was 0.32.  Rest of the hypercoagulable workup was deferred given the fact that she had recent thromboembolism and current Eliquis use, which can result in falsely abnormal labs.  Today we did check protein C activity, protein S activity, Antithrombin III activity, lupus anticoagulant, to complete workup.  Will follow-up on the  results.  D-dimer remains low at 0.38.  Since she completed 6 months of anticoagulation, discussed discontinuing anticoagulation completely versus changing to prophylactic dose.  Since she had an unprovoked pulmonary emboli, patient opted to remain on prophylactic dose of Eliquis.  Eliquis was dose reduced to prophylactic dosing of 2.5 mg p.o. twice daily starting from today.  RTC in 6 months for follow-up.  At risk for falls History of falls, lives alone. On anticoagulation, increasing risk of bleeding with falls. -Advised to be cautious to prevent falls, especially while on anticoagulation.  She was advised to use cane/walker for ambulation    I spent a total of 25 minutes during this encounter with the patient including review of chart and various tests results, discussions about plan of care and coordination of care plan.  I reviewed lab results and outside records for this visit and discussed relevant results with the patient. Diagnosis, plan of care and treatment options were also discussed in detail with the patient. Opportunity provided to ask questions and answers provided to her apparent satisfaction. Provided instructions to call our clinic with any problems, questions or concerns prior to return visit. I recommended to continue follow-up with PCP and sub-specialists. She verbalized understanding and agreed with the plan. No barriers to learning was detected.  Meryl Crutch, MD  09/05/2023 1:34 PM  [facility] [address] [place] [address] Dept: [phone] Dept Fax: [phone]   CHIEF COMPLAINT/ REASON FOR VISIT:  History of pulmonary embolism diagnosed in September 2024.  Unprovoked.  Grossly negative hypercoagulable workup.  INTERVAL HISTORY:  Discussed the use of AI scribe software for clinical note transcription with the patient,  who gave verbal consent to proceed.    Patient returns for follow-up.  She has been on Eliquis twice daily since the diagnosis and reports no side effects such as bleeding or bruising. Genetic testing for mutations that could increase the risk of clots was negative. The patient also has a longstanding varicose vein, which has not caused any issues. She has been experiencing some breathlessness, but it resolves with rest. The patient has also reported a decrease in appetite and a slight weight loss, but she is supplementing her diet with Ensure.  SUMMARY OF HEMATOLOGIC HISTORY:  Patient was admitted to the hospital on 02/25/2023 after she presented with complaints of elevated heart rate, fatigue, dyspnea on exertion.  In the ED, D-dimer was elevated at 6.24.  CTA chest demonstrated a proximal segmental RML and small subsegmental LUL pulmonary embolus without evidence of right heart strain in addition to a small peripheral consolidation concerning for atelectasis versus infarct.  She was admitted for further management.  Initially she was on heparin drip and later transitioned to Eliquis.   Based on history, pulmonary embolism seems to be unprovoked.  Hence she was referred to [place] for further evaluation and also recommendations regarding anticoagulation.   She has been compliant with Eliquis 5 mg twice daily and reports no problems with the medication and denies any bleeding or bruising. She lives alone and has a history of falls, including one prior to her hospital admission. She also has a large varicose vein in her leg that has not ruptured. She reports occasional dizziness upon waking up in the morning but waits for it to pass before getting up. She also mentions occasional wheezing when she exerts herself but has an inhaler for it. She used to smoke in her twenties but quit a long time ago.   Bilateral pulmonary emboli diagnosed in September 2024. No recent surgeries, long trips, or immobilization. No current symptoms of chest pain or  shortness of breath. On Eliquis 5 mg twice daily without any reported side effects or bleeding.   Given bilateral pulmonary emboli, advised anticoagulation for minimum of 6 months.   Based on history, pulmonary emboli seems to be unprovoked.  Hence we obtained thrombophilia workup on her initial consultation with [place] on 05/09/2023.  Prothrombin gene mutation, factor V Leiden mutation, beta-2 glycoprotein antibodies, anticardiolipin antibodies were all negative.  D-dimer was 0.32.  Rest of the hypercoagulable workup was deferred given the fact that she had recent thromboembolism and current Eliquis use, which can result in falsely abnormal labs.   Following completion of 6 months of anticoagulation, Eliquis was dose reduced to prophylactic dosing of 2.5 mg p.o. twice daily starting from March 2025.   I have reviewed the past medical history, past surgical history, social history and family history with the patient and they are unchanged from previous note.  ALLERGIES: She has no known allergies.  MEDICATIONS:  Current Outpatient Medications  Medication Sig Dispense Refill   atorvastatin (LIPITOR) 40 MG tablet Take 1 tablet (40 mg total) by mouth daily. 90 tablet 1   Calcium Carbonate-Vitamin D (OYSTER SHELL CALCIUM/D) 250-125 MG-UNIT TABS Take by mouth.     escitalopram (LEXAPRO) 5 MG tablet Take 1 tablet (5 mg total) by mouth daily. 90 tablet 1   gabapentin (NEURONTIN) 300 MG capsule Take 1 capsule (300 mg total) by mouth at bedtime. 90 capsule 1   metFORMIN (GLUCOPHAGE) 500 MG tablet TAKE 1 TABLET EVERY MORNING & 2 TABLETS AT NIGHT 270 tablet 1  metoprolol succinate (TOPROL-XL) 50 MG 24 hr tablet Take 1 tablet (50 mg total) by mouth daily. Take with or immediately following a meal. 90 tablet 3   Multiple Vitamin (MULTIVITAMIN) tablet Take 1 tablet by mouth daily.     pantoprazole (PROTONIX) 40 MG tablet TAKE ONE TABLET DAILY 90 tablet 0   albuterol (VENTOLIN HFA) 108 (90 Base) MCG/ACT inhaler  Inhale 2 puffs into the lungs every 6 (six) hours as needed for wheezing or shortness of breath. (Patient not taking: Reported on 09/05/2023) 8 g 0   albuterol (VENTOLIN HFA) 108 (90 Base) MCG/ACT inhaler Inhale 2 puffs into the lungs every 4 (four) hours as needed for wheezing or shortness of breath. (Patient not taking: Reported on 09/05/2023) 8 g 1   apixaban (ELIQUIS) 2.5 MG TABS tablet Take 1 tablet (2.5 mg total) by mouth 2 (two) times daily. 60 tablet 5   metoprolol tartrate (LOPRESSOR) 25 MG tablet Take 1 tablet (25 mg total) by mouth 2 (two) times daily. 60 tablet 0   No current facility-administered medications for this visit.     REVIEW OF SYSTEMS:    Review of Systems - Oncology  All other pertinent systems were reviewed with the patient and are negative.  PHYSICAL EXAMINATION:    Onc Performance Status - 09/05/23 1136       ECOG Perf Status   ECOG Perf Status Restricted in physically strenuous activity but ambulatory and able to carry out work of a light or sedentary nature, e.g., light house work, office work      KPS SCALE   KPS % SCORE Able to carry on normal activity, minor s/s of disease             Vitals:   09/05/23 1130  BP: 129/64  Pulse: 84  Resp: 18  Temp: 98.1 F (36.7 C)  SpO2: 99%   Filed Weights   09/05/23 1130  Weight: 117 lb 3.2 oz (53.2 kg)    Physical Exam Constitutional:      General: She is not in acute distress.    Appearance: Normal appearance.  HENT:     Head: Normocephalic and atraumatic.  Eyes:     General: No scleral icterus.    Conjunctiva/sclera: Conjunctivae normal.  Cardiovascular:     Rate and Rhythm: Normal rate and regular rhythm.     Heart sounds: Normal heart sounds.  Pulmonary:     Effort: Pulmonary effort is normal.     Breath sounds: Normal breath sounds.  Abdominal:     General: There is no distension.  Musculoskeletal:     Right lower leg: No edema.     Left lower leg: No edema.     Comments:  Varicose veins on RLE  Neurological:     General: No focal deficit present.     Mental Status: She is alert and oriented to person, place, and time.  Psychiatric:        Mood and Affect: Mood normal.        Behavior: Behavior normal.        Thought Content: Thought content normal.     LABORATORY DATA:   I have reviewed the data as listed.  Results for orders placed or performed in visit on 09/05/23  D-dimer, quantitative  Result Value Ref Range   D-Dimer, Quant 0.38 0.00 - 0.50 ug/mL-FEU  CBC with Differential (Cancer Center Only)  Result Value Ref Range   WBC Count 5.0 4.0 - 10.5 K/uL  RBC 4.00 3.87 - 5.11 MIL/uL   Hemoglobin 12.0 12.0 - 15.0 g/dL   HCT 16.1 09.6 - 04.5 %   MCV 93.5 80.0 - 100.0 fL   MCH 30.0 26.0 - 34.0 pg   MCHC 32.1 30.0 - 36.0 g/dL   RDW 40.9 81.1 - 91.4 %   Platelet Count 291 150 - 400 K/uL   nRBC 0.0 0.0 - 0.2 %   Neutrophils Relative % 63 %   Neutro Abs 3.2 1.7 - 7.7 K/uL   Lymphocytes Relative 23 %   Lymphs Abs 1.1 0.7 - 4.0 K/uL   Monocytes Relative 10 %   Monocytes Absolute 0.5 0.1 - 1.0 K/uL   Eosinophils Relative 3 %   Eosinophils Absolute 0.1 0.0 - 0.5 K/uL   Basophils Relative 1 %   Basophils Absolute 0.0 0.0 - 0.1 K/uL   Immature Granulocytes 0 %   Abs Immature Granulocytes 0.01 0.00 - 0.07 K/uL    RADIOGRAPHIC STUDIES:  No recent pertinent imaging studies available to review.  Orders Placed This Encounter  Procedures   CBC with Differential (Cancer Center Only)    Standing Status:   Future    Expiration Date:   09/04/2024   D-dimer, quantitative    Standing Status:   Future    Expiration Date:   09/04/2024     Future Appointments  Date Time Provider Department Center  10/10/2023 10:40 AM Felix Pacini A, DO [facility]  11/01/2023  1:50 PM [facility] ANNUAL WELLNESS VISIT [facility]     This document was completed utilizing speech recognition software. Grammatical errors, random word insertions, pronoun errors, and  incomplete sentences are an occasional consequence of this system due to software limitations, ambient noise, and hardware issues. Any formal questions or concerns about the content, text or information contained within the body of this dictation should be directly addressed to the provider for clarification.

## 2023-09-06 LAB — PROTEIN S PANEL
Protein S Activity: 88 % (ref 63–140)
Protein S Ag, Free: 110 % (ref 61–136)
Protein S Ag, Total: 98 % (ref 60–150)

## 2023-09-06 LAB — ANTITHROMBIN PANEL
AT III AG PPP IMM-ACNC: 103 % (ref 72–124)
Antithrombin Activity: 150 % — ABNORMAL HIGH (ref 75–135)

## 2023-09-06 LAB — LUPUS ANTICOAGULANT PANEL
DRVVT: 68.2 s — ABNORMAL HIGH (ref 0.0–47.0)
PTT Lupus Anticoagulant: 33.7 s (ref 0.0–43.5)

## 2023-09-06 LAB — DRVVT MIX: dRVVT Mix: 53.2 s — ABNORMAL HIGH (ref 0.0–40.4)

## 2023-09-06 LAB — PROTEIN C ACTIVITY: Protein C Activity: 126 % (ref 73–180)

## 2023-09-06 LAB — DRVVT CONFIRM: dRVVT Confirm: 1 ratio (ref 0.8–1.2)

## 2023-09-12 ENCOUNTER — Ambulatory Visit: Payer: Self-pay

## 2023-09-12 NOTE — Telephone Encounter (Signed)
Pt scheduled for 3/26

## 2023-09-12 NOTE — Telephone Encounter (Addendum)
 Copied from CRM [id number]. Topic: Clinical - Red Word Triage >> Sep 12, 2023  8:11 AM Melissa [name] wrote: Red Word that prompted transfer to Nurse Triage: Pain in back, can't bend down and is hard to walk.   Chief Complaint: Lower Back Pain Symptoms: pain Frequency: x 1 day Pertinent Negatives: Patient denies  fever, abdomen pain, burning with urination, blood in urine, weakness. Numbness, chest pain, diarrhea, vomiting, difficulty breathing, changes in bowel/bladder control. Disposition: [] ED /[] Urgent Care (no appt availability in office) / [] Appointment(In office/virtual)/ []  [facility]/ [] Home Care/ [x] Refused Recommended Disposition /[] [facility]/ []  Follow-up with PCP Additional Notes: Patient called and advised that she has been having lower back pain that started yesterday morning. She denies any pain radiating down either leg.  She did say that sometimes it feels like her abdomen hurts too but that isn't all the time and she currently is not having any abdominal pain at all. She states that last week she shampooed her carpet and then was outside raking sticks up outside. She states that she has used a heating pad on her lower back and also used Aspercreme and Biofreeze on her lower back with minimal relief. Patient states that she was shampooing her carpet last week and then was raking sticks outside last week as well.  She didn't know if she may have pulled a muscle doing these things or not. She states that her pain level is severe a 9 out of 10. Patient denies  fever, abdomen pain, burning with urination, blood in urine, weakness. Numbness, chest pain, diarrhea, vomiting, difficulty breathing. She denies any changes in her bowel/bladder control. Patient states that she didn't want to go to an urgent care because she has a history of Afib and if they find anything with her heart they will send her to the hospital. She is asked if she feels like her heart is beating  fast or different and she states that her heart is fine at this time. Patient is advised that the recommendation at this time with her pain being severe enough that she can't bend over is that she be seen by a provider in the next four hours for further evaluation. Patient states that she only wants to see her PCP [facility], DO. No appointments are available with her PCP today. Patient is advised that if anything worsens to go to the Emergency Room or call 911 for an ambulance to take her to the Emergency Room.  Patient states that she didn't want to end up doing that and just wanted a message sent to her PCP to see if she could be worked in just for "a shot" for her back. She states she has had this before and had to get a shot in the past and she thinks it is probably involving a muscle. Patient is reminded that at this time the recommendation is for her to be seen in the next 4 hours by a provider and she is again encouraged to seek immediate medical attention if anything gets worse. Patient verbalized understanding.  Reason for Disposition  [1] SEVERE back pain (e.g., excruciating, unable to do any normal activities) AND [2] not improved 2 hours after pain medicine  Answer Assessment - Initial Assessment Questions 1. ONSET: "When did the pain begin?"      X 1 day 2. LOCATION: "Where does it hurt?" (upper, mid or lower back)     Lower Back 3. SEVERITY: "How bad is the pain?"  (  e.g., Scale 1-10; mild, moderate, or severe)   - MILD (1-3): Doesn't interfere with normal activities.    - MODERATE (4-7): Interferes with normal activities or awakens from sleep.    - SEVERE (8-10): Excruciating pain, unable to do any normal activities.      9 4. PATTERN: "Is the pain constant?" (e.g., yes, no; constant, intermittent)      constant 5. RADIATION: "Does the pain shoot into your legs or somewhere else?"     "Sometimes to the stomach but not all the time" 6. CAUSE:  "What do you think is causing the  back pain?"      unsure 7. BACK OVERUSE:  "Any recent lifting of heavy objects, strenuous work or exercise?"     Shampooing carpet last week and raking up sticks outside 8. MEDICINES: "What have you taken so far for the pain?" (e.g., nothing, acetaminophen, NSAIDS)     Heating pad and rubbing Aspercreme and Biofreeze on it 9. NEUROLOGIC SYMPTOMS: "Do you have any weakness, numbness, or problems with bowel/bladder control?"     No 10. OTHER SYMPTOMS: "Do you have any other symptoms?" (e.g., fever, abdomen pain, burning with urination, blood in urine)       No  Protocols used: Back Pain-A-AH

## 2023-09-13 ENCOUNTER — Ambulatory Visit: Admitting: Family Medicine

## 2023-09-13 ENCOUNTER — Encounter: Payer: Self-pay | Admitting: Family Medicine

## 2023-09-13 ENCOUNTER — Ambulatory Visit (INDEPENDENT_AMBULATORY_CARE_PROVIDER_SITE_OTHER): Admitting: Family Medicine

## 2023-09-13 VITALS — BP 108/69 | HR 79 | Temp 98.0°F | Wt 120.0 lb

## 2023-09-13 DIAGNOSIS — M533 Sacrococcygeal disorders, not elsewhere classified: Secondary | ICD-10-CM | POA: Diagnosis not present

## 2023-09-13 DIAGNOSIS — M545 Low back pain, unspecified: Secondary | ICD-10-CM | POA: Diagnosis not present

## 2023-09-13 LAB — POC URINALSYSI DIPSTICK (AUTOMATED)
Bilirubin, UA: NEGATIVE
Glucose, UA: NEGATIVE
Ketones, UA: NEGATIVE
Nitrite, UA: NEGATIVE
Protein, UA: POSITIVE — AB
Spec Grav, UA: 1.025 (ref 1.010–1.025)
Urobilinogen, UA: NEGATIVE U/dL — AB
pH, UA: 6 (ref 5.0–8.0)

## 2023-09-13 MED ORDER — METHYLPREDNISOLONE ACETATE 80 MG/ML IJ SUSP
80.0000 mg | Freq: Once | INTRAMUSCULAR | Status: AC
Start: 2023-09-13 — End: 2023-09-13
  Administered 2023-09-13: 80 mg via INTRAMUSCULAR

## 2023-09-13 NOTE — Addendum Note (Signed)
 Addended by: Daron Offer A on: 09/13/2023 03:30 PM   Modules accepted: Orders

## 2023-09-13 NOTE — Progress Notes (Signed)
 Melissa [name] , 28-Mar-1935, 89 y.o., female MRN: [id number] Patient Care Team    Relationship Specialty Notifications Start End  Natalia Leatherwood, DO PCP - General Family Medicine  07/03/18   Lennette Bihari, MD PCP - Cardiology Cardiology  05/20/23   Edmon Crape, MD Consulting Physician Ophthalmology  07/03/18   Allie Bossier, MD Consulting Physician Obstetrics and Gynecology  07/04/18   Lennette Bihari, MD Consulting Physician Cardiology  07/04/18   Patton Salles, MD Consulting Physician Obstetrics and Gynecology  02/18/20   Ponciano Ort, [facility]    12/01/21       Chief Complaint  Patient presents with   Back Pain    Started Monday, lower back. C/o urinary frequency. Pt has taken tylenol.      Subjective: Melissa [name] is a 89 y.o. Pt presents for an OV with complaints of low back pain started Monday night. She reports she had shampooed her carpet Monday and raked her yard Tuesday. Pain located left lower back (points to SI joint).  Pain worse with bending forward.  Pt has tried advil to ease their symptoms.   She also mentions her urinary frequency, that did not start at the same time as her back pain, but has occurred intermittently. No fever or chills.      05/09/2023   10:28 AM 12/26/2022    9:33 AM 10/26/2022    1:32 PM 06/29/2022    1:53 PM 12/01/2021   10:07 AM  Depression screen PHQ 2/9  Decreased Interest 0 0 0 0 0  Down, Depressed, Hopeless 0 0 0 0 1  PHQ - 2 Score 0 0 0 0 1  Altered sleeping     3  Tired, decreased energy     2  Change in appetite     0  Feeling bad or failure about yourself      0  Trouble concentrating     0  Moving slowly or fidgety/restless     0  Suicidal thoughts     0  PHQ-9 Score     6    No Known Allergies Social History   Social History Narrative   Marital status/children/pets: Widowed.   Education/employment: 12th grade education.  Retired.   Safety:      -smoke alarm in the home:Yes      - wears seatbelt: Yes     - Feels safe in their relationships: Yes   Past Medical History:  Diagnosis Date   Burning tongue syndrome 11/11/2015   Colon polyps    Cystocele with prolapse 2019   Dr. Marice Potter- Monitor for now.    Detached retina, right    Diabetes mellitus    Diaphragmatic hernia    Diverticulitis 01/26/2022   GERD (gastroesophageal reflux disease)    Humerus fracture 02/26/2023   Hyperlipidemia    Insomnia    Macular degeneration    Past Surgical History:  Procedure Laterality Date   ABDOMINAL HYSTERECTOMY     BREAST CYST ASPIRATION  11/03/2020   FOOT SURGERY     OOPHORECTOMY     Rt.ovary removed with Hyst   RETINAL DETACHMENT SURGERY     TONSILLECTOMY     Family History  Problem Relation Age of Onset   Heart disease Other    Breast cancer Other    Breast cancer Mother        metastatic--to bone   CAD Father  Cancer Maternal Grandmother        unknown   Cancer Paternal Grandmother        colon   Allergies as of 09/13/2023   No Known Allergies      Medication List        Accurate as of September 13, 2023  2:43 PM. If you have any questions, ask your nurse or doctor.          albuterol 108 (90 Base) MCG/ACT inhaler Commonly known as: VENTOLIN HFA Inhale 2 puffs into the lungs every 6 (six) hours as needed for wheezing or shortness of breath.   albuterol 108 (90 Base) MCG/ACT inhaler Commonly known as: VENTOLIN HFA Inhale 2 puffs into the lungs every 4 (four) hours as needed for wheezing or shortness of breath.   apixaban 2.5 MG Tabs tablet Commonly known as: Eliquis Take 1 tablet (2.5 mg total) by mouth 2 (two) times daily.   atorvastatin 40 MG tablet Commonly known as: LIPITOR Take 1 tablet (40 mg total) by mouth daily.   escitalopram 5 MG tablet Commonly known as: Lexapro Take 1 tablet (5 mg total) by mouth daily.   gabapentin 300 MG capsule Commonly known as: NEURONTIN Take 1 capsule (300 mg total) by mouth at bedtime.    metFORMIN 500 MG tablet Commonly known as: GLUCOPHAGE TAKE 1 TABLET EVERY MORNING & 2 TABLETS AT NIGHT   metoprolol succinate 50 MG 24 hr tablet Commonly known as: TOPROL-XL Take 1 tablet (50 mg total) by mouth daily. Take with or immediately following a meal.   metoprolol tartrate 25 MG tablet Commonly known as: LOPRESSOR Take 1 tablet (25 mg total) by mouth 2 (two) times daily.   multivitamin tablet Take 1 tablet by mouth daily.   Oyster Shell Calcium/D 250-125 MG-UNIT Tabs Take by mouth.   pantoprazole 40 MG tablet Commonly known as: PROTONIX TAKE ONE TABLET DAILY        All past medical history, surgical history, allergies, family history, immunizations andmedications were updated in the EMR today and reviewed under the history and medication portions of their EMR.     ROS Negative, with the exception of above mentioned in HPI   Objective:  BP 108/69   Pulse 79   Temp 98 F (36.7 C)   Wt 120 lb (54.4 kg)   SpO2 95%   BMI 21.26 kg/m  Body mass index is 21.26 kg/m.  Physical Exam Vitals and nursing note reviewed.  Constitutional:      General: She is not in acute distress.    Appearance: Normal appearance. She is normal weight. She is not ill-appearing or toxic-appearing.  HENT:     Head: Normocephalic and atraumatic.  Eyes:     General: No scleral icterus.       Right eye: No discharge.        Left eye: No discharge.     Extraocular Movements: Extraocular movements intact.     Conjunctiva/sclera: Conjunctivae normal.     Pupils: Pupils are equal, round, and reactive to light.  Abdominal:     General: Abdomen is flat.     Palpations: Abdomen is soft.     Tenderness: There is no right CVA tenderness, left CVA tenderness, guarding or rebound.  Musculoskeletal:        General: Tenderness present.       Back:     Comments: Left SI TTP. Uncomfortable flexion and rotation. NV intact distally  Skin:    Findings: No rash.  Neurological:     Mental  Status: She is alert and oriented to person, place, and time. Mental status is at baseline.     Motor: No weakness.     Coordination: Coordination normal.     Gait: Gait normal.  Psychiatric:        Mood and Affect: Mood normal.        Behavior: Behavior normal.        Thought Content: Thought content normal.        Judgment: Judgment normal.      No results found. No results found. No results found for this or any previous visit (from the past 24 hours).  Assessment/Plan: Melissa [name] is a 89 y.o. female present for OV for  Acute low back pain, unspecified back pain laterality, unspecified whether sciatica present Pain of left sacroiliac joint (Primary) IM depo medrol 80 provided Can continue NSAID use with food for pain control.  Rest, heating pad.  F/u 2 weeks if not seeing improvement.   Urine freq: Unlikely associated with current sx. This is a chronic int. Complaint- will check urine and cx and treat if positive cx - Urinalysis w microscopic + reflex cultur - POCT Urinalysis Dipstick (Automated) Reviewed expectations re: course of current medical issues. Discussed self-management of symptoms. Outlined signs and symptoms indicating need for more acute intervention. Patient verbalized understanding and all questions were answered. Patient received an After-Visit Summary.    Orders Placed This Encounter  Procedures   Urinalysis w microscopic + reflex cultur   POCT Urinalysis Dipstick (Automated)   No orders of the defined types were placed in this encounter.  Referral Orders  No referral(s) requested today     Note is dictated utilizing voice recognition software. Although note has been proof read prior to signing, occasional typographical errors still can be missed. If any questions arise, please do not hesitate to call for verification.   electronically signed by:  Felix Pacini, DO  [facility] [place]

## 2023-09-15 LAB — URINALYSIS W MICROSCOPIC + REFLEX CULTURE
Bacteria, UA: NONE SEEN /HPF
Bilirubin Urine: NEGATIVE
Glucose, UA: NEGATIVE
Hgb urine dipstick: NEGATIVE
Hyaline Cast: NONE SEEN /LPF
Ketones, ur: NEGATIVE
Nitrites, Initial: NEGATIVE
Protein, ur: NEGATIVE
Specific Gravity, Urine: 1.023 (ref 1.001–1.035)
WBC, UA: NONE SEEN /HPF (ref 0–5)
pH: <=5 (ref 5.0–8.0)

## 2023-09-15 LAB — CULTURE INDICATED

## 2023-09-15 LAB — URINE CULTURE
MICRO NUMBER:: 16254353
Result:: NO GROWTH
SPECIMEN QUALITY:: ADEQUATE

## 2023-10-10 ENCOUNTER — Telehealth: Payer: Self-pay

## 2023-10-10 ENCOUNTER — Encounter: Payer: Self-pay | Admitting: Family Medicine

## 2023-10-10 ENCOUNTER — Ambulatory Visit (INDEPENDENT_AMBULATORY_CARE_PROVIDER_SITE_OTHER): Payer: No Typology Code available for payment source | Admitting: Family Medicine

## 2023-10-10 VITALS — BP 112/68 | HR 67 | Temp 97.9°F | Wt 117.8 lb

## 2023-10-10 DIAGNOSIS — F419 Anxiety disorder, unspecified: Secondary | ICD-10-CM

## 2023-10-10 DIAGNOSIS — M546 Pain in thoracic spine: Secondary | ICD-10-CM

## 2023-10-10 DIAGNOSIS — K219 Gastro-esophageal reflux disease without esophagitis: Secondary | ICD-10-CM | POA: Diagnosis not present

## 2023-10-10 DIAGNOSIS — E785 Hyperlipidemia, unspecified: Secondary | ICD-10-CM | POA: Diagnosis not present

## 2023-10-10 DIAGNOSIS — Z7984 Long term (current) use of oral hypoglycemic drugs: Secondary | ICD-10-CM

## 2023-10-10 DIAGNOSIS — M25552 Pain in left hip: Secondary | ICD-10-CM | POA: Diagnosis not present

## 2023-10-10 DIAGNOSIS — E1169 Type 2 diabetes mellitus with other specified complication: Secondary | ICD-10-CM

## 2023-10-10 DIAGNOSIS — G8929 Other chronic pain: Secondary | ICD-10-CM

## 2023-10-10 DIAGNOSIS — I4891 Unspecified atrial fibrillation: Secondary | ICD-10-CM | POA: Diagnosis not present

## 2023-10-10 DIAGNOSIS — E119 Type 2 diabetes mellitus without complications: Secondary | ICD-10-CM

## 2023-10-10 DIAGNOSIS — I10 Essential (primary) hypertension: Secondary | ICD-10-CM

## 2023-10-10 DIAGNOSIS — I739 Peripheral vascular disease, unspecified: Secondary | ICD-10-CM

## 2023-10-10 DIAGNOSIS — Z79899 Other long term (current) drug therapy: Secondary | ICD-10-CM

## 2023-10-10 DIAGNOSIS — E113213 Type 2 diabetes mellitus with mild nonproliferative diabetic retinopathy with macular edema, bilateral: Secondary | ICD-10-CM | POA: Diagnosis not present

## 2023-10-10 DIAGNOSIS — M5442 Lumbago with sciatica, left side: Secondary | ICD-10-CM

## 2023-10-10 DIAGNOSIS — D509 Iron deficiency anemia, unspecified: Secondary | ICD-10-CM | POA: Diagnosis not present

## 2023-10-10 DIAGNOSIS — M5136 Other intervertebral disc degeneration, lumbar region with discogenic back pain only: Secondary | ICD-10-CM | POA: Diagnosis not present

## 2023-10-10 LAB — B12 AND FOLATE PANEL
Folate: >25.2 ng/mL (ref >5.9)
Vitamin B-12: 377 pg/mL (ref 211–911)

## 2023-10-10 LAB — LIPID PANEL
Cholesterol: 174 mg/dL (ref 0–200)
HDL: 75.8 mg/dL (ref >39.00)
LDL Cholesterol: 73 mg/dL (ref 0–99)
NonHDL: 98.3
Total CHOL/HDL Ratio: 2
Triglycerides: 125 mg/dL (ref 0.0–149.0)
VLDL: 25 mg/dL (ref 0.0–40.0)

## 2023-10-10 LAB — HEMOGLOBIN A1C: Hgb A1c MFr Bld: 6.4 % (ref 4.6–6.5)

## 2023-10-10 LAB — MICROALBUMIN / CREATININE URINE RATIO
Creatinine,U: 108.9 mg/dL
Microalb Creat Ratio: 24.3 mg/g (ref 0.0–30.0)
Microalb, Ur: 2.6 mg/dL — ABNORMAL HIGH (ref 0.0–1.9)

## 2023-10-10 LAB — BASIC METABOLIC PANEL WITH GFR
BUN: 18 mg/dL (ref 6–23)
CO2: 28 meq/L (ref 19–32)
Calcium: 9.3 mg/dL (ref 8.4–10.5)
Chloride: 105 meq/L (ref 96–112)
Creatinine, Ser: 0.87 mg/dL (ref 0.40–1.20)
GFR: 60.1 mL/min (ref >60.00)
Glucose, Bld: 93 mg/dL (ref 70–99)
Potassium: 4.9 meq/L (ref 3.5–5.1)
Sodium: 141 meq/L (ref 135–145)

## 2023-10-10 LAB — VITAMIN D 25 HYDROXY (VIT D DEFICIENCY, FRACTURES): VITD: 106.13 ng/mL (ref 30.00–100.00)

## 2023-10-10 MED ORDER — GABAPENTIN 300 MG PO CAPS: 300.0000 mg | ORAL_CAPSULE | Freq: Every day | ORAL | 1 refills | Status: DC

## 2023-10-10 MED ORDER — DICYCLOMINE HCL 10 MG PO CAPS: ORAL_CAPSULE | ORAL | 5 refills | Status: DC

## 2023-10-10 MED ORDER — METFORMIN HCL 500 MG PO TABS: ORAL_TABLET | ORAL | 1 refills | Status: DC

## 2023-10-10 MED ORDER — PANTOPRAZOLE SODIUM 40 MG PO TBEC: 40.0000 mg | DELAYED_RELEASE_TABLET | Freq: Every day | ORAL | 1 refills | Status: DC

## 2023-10-10 MED ORDER — ESCITALOPRAM OXALATE 5 MG PO TABS
5.0000 mg | ORAL_TABLET | Freq: Every day | ORAL | 1 refills | Status: DC
Start: 2023-10-10 — End: 2024-03-25

## 2023-10-10 NOTE — Progress Notes (Signed)
 Patient Care Team    Relationship Specialty Notifications Start End  Mariel Shope, DO PCP - General Family Medicine  07/03/18   Millicent Ally, MD PCP - Cardiology Cardiology  05/20/23   Shon Downing, MD Consulting Physician Ophthalmology  07/03/18   Ana Balling, MD Consulting Physician Obstetrics and Gynecology  07/04/18   Millicent Ally, MD Consulting Physician Cardiology  07/04/18   Greta Leatherwood, MD Consulting Physician Obstetrics and Gynecology  02/18/20   Pllc, [facility]     12/01/21     SUBJECTIVE Chief Complaint  Patient presents with   Diabetes    Pt is fasting.     HPI: Melissa [name] is a 89 y.o. present for Chronic Conditions/illness Management  Diabetes/macular degeneration/nonproliferative retinopathy:  Pt reports compliance with metformin  500 mg in the morning and 1000 mg in the afternoon. Patient denies dizziness, hyperglycemic or hypoglycemic events. Patient denies numbness, tingling in the extremities or nonhealing wounds of feet.   A. Fib/htn//hyperlipidemia/peripheral artery disease She reports compliance with her metoprolol  50mg  daily. She is prescribed Lipitor 40 mg daily and takes a daily baby aspirin .   Echo Doppler study on October 03, 2017 showed an EF of 65 to 70%.  There was severe mitral annular calcification with very mild functional MS by valve area.  Patient denies chest pain, shortness of breath, dizziness or lower extremity edema.  Est with cardio  Anxiety: Pt reports compliance with lexapro  5 mg every day. She feels med is working well.  GERD: Patient reports Her GERD is controlled.  She is compliant with Protonix  40 mg daily.  She has been on this for quite some time and takes it daily. Without medication her symptoms return.   Chronic back pain  Patient reports compliance with gabapentin  has been extremely helpful. She only occ.will use the Zanaflex  and but make sure not to take at the same time as  her gabapentin .  She deniesany oversedation.   ROS: See pertinent positives and negatives per HPI.  Patient Active Problem List   Diagnosis Date Noted   Hypomagnesemia 05/20/2023   New onset a-fib (HCC) 05/19/2023   At risk for falls 05/09/2023   Essential hypertension 02/26/2023   Normocytic anemia 02/26/2023   Hx pulmonary embolism 02/25/2023   Lumbar degenerative disc disease 01/27/2023   Retrolisthesis of vertebrae-L2 on L3 approximately 7-8 mm 01/27/2023   Callus 10/21/2020   Early stage nonexudative age-related macular degeneration of left eye 04/13/2020   Advanced nonexudative age-related macular degeneration of right eye with subfoveal involvement 04/01/2020   Degenerative retinal drusen of left eye 04/01/2020   Pseudophakia, both eyes 04/01/2020   Posterior vitreous detachment of left eye 04/01/2020   Anxiety 03/05/2019   Chronic bilateral thoracic back pain 08/03/2018   Iron deficiency anemia 07/04/2018   Peripheral arterial disease (HCC) 07/11/2017   Varicose veins of both lower extremities 08/31/2016   Macular degeneration 05/05/2015   Hiatal hernia 01/28/2015   Mild nonproliferative retinopathy of both eyes with macular edema associated with type 2 diabetes mellitus (HCC) 10/02/2013   Type 2 diabetes mellitus (HCC) 03/24/2009   GERD 03/24/2009    Social History   Tobacco Use   Smoking status: Never   Smokeless tobacco: Never  Substance Use Topics   Alcohol use: No    Current Outpatient Medications:    atorvastatin  (LIPITOR) 40 MG tablet, Take 1 tablet (40 mg total) by mouth daily., Disp: 90 tablet, Rfl: 1  Calcium  Carbonate-Vitamin D  (OYSTER SHELL CALCIUM /D) 250-125 MG-UNIT TABS, Take by mouth., Disp: , Rfl:    escitalopram  (LEXAPRO ) 5 MG tablet, Take 1 tablet (5 mg total) by mouth daily., Disp: 90 tablet, Rfl: 1   gabapentin  (NEURONTIN ) 300 MG capsule, Take 1 capsule (300 mg total) by mouth at bedtime., Disp: 90 capsule, Rfl: 1   metFORMIN  (GLUCOPHAGE ) 500  MG tablet, TAKE 1 TABLET EVERY MORNING & 2 TABLETS AT NIGHT, Disp: 270 tablet, Rfl: 1   metoprolol  succinate (TOPROL -XL) 50 MG 24 hr tablet, Take 1 tablet (50 mg total) by mouth daily. Take with or immediately following a meal., Disp: 90 tablet, Rfl: 3   Multiple Vitamin (MULTIVITAMIN) tablet, Take 1 tablet by mouth daily., Disp: , Rfl:    pantoprazole  (PROTONIX ) 40 MG tablet, TAKE ONE TABLET DAILY, Disp: 90 tablet, Rfl: 0   albuterol  (VENTOLIN  HFA) 108 (90 Base) MCG/ACT inhaler, Inhale 2 puffs into the lungs every 6 (six) hours as needed for wheezing or shortness of breath. (Patient not taking: Reported on 09/05/2023), Disp: 8 g, Rfl: 0   albuterol  (VENTOLIN  HFA) 108 (90 Base) MCG/ACT inhaler, Inhale 2 puffs into the lungs every 4 (four) hours as needed for wheezing or shortness of breath. (Patient not taking: Reported on 09/05/2023), Disp: 8 g, Rfl: 1   apixaban  (ELIQUIS ) 2.5 MG TABS tablet, Take 1 tablet (2.5 mg total) by mouth 2 (two) times daily. (Patient not taking: Reported on 10/10/2023), Disp: 60 tablet, Rfl: 5  No Known Allergies  OBJECTIVE: BP 112/68   Pulse 67   Temp 97.9 F (36.6 C)   Wt 117 lb 12.8 oz (53.4 kg)   SpO2 95%   BMI 20.87 kg/m  Physical Exam Vitals and nursing note reviewed.  Constitutional:      General: She is not in acute distress.    Appearance: Normal appearance. She is not ill-appearing, toxic-appearing or diaphoretic.  HENT:     Head: Normocephalic and atraumatic.  Eyes:     General: No scleral icterus.       Right eye: No discharge.        Left eye: No discharge.     Extraocular Movements: Extraocular movements intact.     Conjunctiva/sclera: Conjunctivae normal.     Pupils: Pupils are equal, round, and reactive to light.  Cardiovascular:     Rate and Rhythm: Normal rate and regular rhythm.  Pulmonary:     Effort: Pulmonary effort is normal. No respiratory distress.     Breath sounds: Normal breath sounds. No wheezing, rhonchi or rales.   Musculoskeletal:     Right lower leg: No edema.     Left lower leg: No edema.  Skin:    General: Skin is warm.     Findings: No rash.  Neurological:     Mental Status: She is alert and oriented to person, place, and time. Mental status is at baseline.     Motor: No weakness.     Gait: Gait normal.  Psychiatric:        Mood and Affect: Mood normal.        Behavior: Behavior normal.        Thought Content: Thought content normal.        Judgment: Judgment normal.    No results found for this or any previous visit (from the past 24 hours).   ASSESSMENT AND PLAN: Melissa [name] is a 89 y.o. female present for  Controlled type 2 diabetes mellitus (HCC)/Type 2 DM mild  nonproliferative retinopathy, macular edema, uncontrol (HCC) Continue  metformin  1500 mg total a day- may divide. PNA series: Completed 2019 Flu shot: Patient is having completed today at the pharmacy Urine microal: UTD 11/09/2022 Foot exam: completed 04/26/2023 Eye exam: Dr. Candi Chafe, routine eye exams with macular degeneration.03/2023 Dr. Seward Dao A1c: 6.5>>7.3 >>6.3>>6.4> 6.8> 6.5 > 6.4 >6.8 > 6.4>7.0>6.3 > A1c  collected today   Hyperlipidemia associated (HCC)/Peripheral arterial disease (HCC)/Palpitations/IDA/CKD 3 - Continue to  avoid caffeine, stimulants-her vice is excess chocolate. -Continue metoprolol  50 mg QD> prescribed by cardio -Continue atorvastatin  40 mg daily> prescribed by cardio - Continue baby aspirin  daily. - avoid NSAIDS; renal dose.   Anxiety Stable Continue lexapro  5 mg QD-tolerating She discontinued  Trazodone  25-50 mg nightly (felt made her shaky)  Gastroesophageal reflux disease without esophagitis Stable Continue Protonix  -Encouraged her to try Biotene 2 times a day.    Fecal smearing/hemorrhoids: Evaluated by Dr. Andriette Keeling  Chronic thoracic back pain and new left jaw discomfort: Stable Continue gabapentin  nightly  Continue  Zanaflex  prn  Return in about 24 weeks (around  03/26/2024).    Orders Placed This Encounter  Procedures   Basic Metabolic Panel (BMET)   Hemoglobin A1c   Lipid panel   Urine Microalbumin w/creat. ratio   Vitamin D  (25 hydroxy)   B12 and Folate Panel    No orders of the defined types were placed in this encounter.     Napolean Backbone, DO 10/10/2023

## 2023-10-10 NOTE — Telephone Encounter (Signed)
 CRITICAL VALUE STICKER  CRITICAL VALUE: Vitamin D  106.13  RECEIVER (on-site recipient of call):Tysheem Accardo J, CMA  DATE & TIME NOTIFIED: 10/10/23 @ 4:42p  MESSENGER (representative from lab): Siah   MD NOTIFIED: Sandra Crouch, MD   TIME OF NOTIFICATION:4:50p  RESPONSE: [facility] for provider

## 2023-10-10 NOTE — Patient Instructions (Addendum)
 Return in about 24 weeks (around 03/26/2024).  Dicyclomine  is the medication you can take 30 minutes before a meal to help prevent urgent bowel movement.      Great to see you today.  I have refilled the medication(s) we provide.   If labs were collected or images ordered, we will inform you of  results once we have received them and reviewed. We will contact you either by echart message, or telephone call.  Please give ample time to the testing facility, and our office to run,  receive and review results. Please do not call inquiring of results, even if you can see them in your chart. We will contact you as soon as we are able. If it has been over 1 week since the test was completed, and you have not yet heard from us , then please call us .    - echart message- for normal results that have been seen by the patient already.   - telephone call: abnormal results or if patient has not viewed results in their echart.  If a referral to a specialist was entered for you, please call us  in 2 weeks if you have not heard from the specialist office to schedule.

## 2023-10-11 ENCOUNTER — Encounter: Payer: Self-pay | Admitting: Internal Medicine

## 2023-11-01 ENCOUNTER — Ambulatory Visit (INDEPENDENT_AMBULATORY_CARE_PROVIDER_SITE_OTHER): Payer: No Typology Code available for payment source | Admitting: *Deleted

## 2023-11-01 DIAGNOSIS — Z0000 Encounter for general adult medical examination without abnormal findings: Secondary | ICD-10-CM | POA: Diagnosis not present

## 2023-11-01 NOTE — Patient Instructions (Signed)
 Ms. Melissa [name] , Thank you for taking time to come for your Medicare Wellness Visit. I appreciate your ongoing commitment to your health goals. Please review the following plan we discussed and let me know if I can assist you in the future.   Screening recommendations/referrals: Colonoscopy: no longer required Mammogram: no longer required Bone Density:  Recommended yearly ophthalmology/optometry visit for glaucoma screening and checkup Recommended yearly dental visit for hygiene and checkup  Vaccinations: Influenza vaccine: up to date Pneumococcal vaccine: up to date Tdap vaccine: up to date Shingles vaccine: up to date       Preventive Care 65 Years and Older, Female Preventive care refers to lifestyle choices and visits with your health care provider that can promote health and wellness. What does preventive care include? A yearly physical exam. This is also called an annual well check. Dental exams once or twice a year. Routine eye exams. Ask your health care provider how often you should have your eyes checked. Personal lifestyle choices, including: Daily care of your teeth and gums. Regular physical activity. Eating a healthy diet. Avoiding tobacco and drug use. Limiting alcohol use. Practicing safe sex. Taking low-dose aspirin  every day. Taking vitamin and mineral supplements as recommended by your health care provider. What happens during an annual well check? The services and screenings done by your health care provider during your annual well check will depend on your age, overall health, lifestyle risk factors, and family history of disease. Counseling  Your health care provider may ask you questions about your: Alcohol use. Tobacco use. Drug use. Emotional well-being. Home and relationship well-being. Sexual activity. Eating habits. History of falls. Memory and ability to understand (cognition). Work and work Astronomer. Reproductive health. Screening  You  may have the following tests or measurements: Height, weight, and BMI. Blood pressure. Lipid and cholesterol levels. These may be checked every 5 years, or more frequently if you are over 37 years old. Skin check. Lung cancer screening. You may have this screening every year starting at age 68 if you have a 30-pack-year history of smoking and currently smoke or have quit within the past 15 years. Fecal occult blood test (FOBT) of the stool. You may have this test every year starting at age 51. Flexible sigmoidoscopy or colonoscopy. You may have a sigmoidoscopy every 5 years or a colonoscopy every 10 years starting at age 32. Hepatitis C blood test. Hepatitis B blood test. Sexually transmitted disease (STD) testing. Diabetes screening. This is done by checking your blood sugar (glucose) after you have not eaten for a while (fasting). You may have this done every 1-3 years. Bone density scan. This is done to screen for osteoporosis. You may have this done starting at age 62. Mammogram. This may be done every 1-2 years. Talk to your health care provider about how often you should have regular mammograms. Talk with your health care provider about your test results, treatment options, and if necessary, the need for more tests. Vaccines  Your health care provider may recommend certain vaccines, such as: Influenza vaccine. This is recommended every year. Tetanus, diphtheria, and acellular pertussis (Tdap, Td) vaccine. You may need a Td booster every 10 years. Zoster vaccine. You may need this after age 13. Pneumococcal 13-valent conjugate (PCV13) vaccine. One dose is recommended after age 59. Pneumococcal polysaccharide (PPSV23) vaccine. One dose is recommended after age 35. Talk to your health care provider about which screenings and vaccines you need and how often you need them. This information  is not intended to replace advice given to you by your health care provider. Make sure you discuss any  questions you have with your health care provider. Document Released: 07/03/2015 Document Revised: 02/24/2016 Document Reviewed: 04/07/2015 Elsevier Interactive Patient Education  2017 [employer].  Fall Prevention in the Home Falls can cause injuries. They can happen to people of all ages. There are many things you can do to make your home safe and to help prevent falls. What can I do on the outside of my home? Regularly fix the edges of walkways and driveways and fix any cracks. Remove anything that might make you trip as you walk through a door, such as a raised step or threshold. Trim any bushes or trees on the path to your home. Use bright outdoor lighting. Clear any walking paths of anything that might make someone trip, such as rocks or tools. Regularly check to see if handrails are loose or broken. Make sure that both sides of any steps have handrails. Any raised decks and porches should have guardrails on the edges. Have any leaves, snow, or ice cleared regularly. Use sand or salt on walking paths during winter. Clean up any spills in your garage right away. This includes oil or grease spills. What can I do in the bathroom? Use night lights. Install grab bars by the toilet and in the tub and shower. Do not use towel bars as grab bars. Use non-skid mats or decals in the tub or shower. If you need to sit down in the shower, use a plastic, non-slip stool. Keep the floor dry. Clean up any water that spills on the floor as soon as it happens. Remove soap buildup in the tub or shower regularly. Attach bath mats securely with double-sided non-slip rug tape. Do not have throw rugs and other things on the floor that can make you trip. What can I do in the bedroom? Use night lights. Make sure that you have a light by your bed that is easy to reach. Do not use any sheets or blankets that are too big for your bed. They should not hang down onto the floor. Have a firm chair that has side  arms. You can use this for support while you get dressed. Do not have throw rugs and other things on the floor that can make you trip. What can I do in the kitchen? Clean up any spills right away. Avoid walking on wet floors. Keep items that you use a lot in easy-to-reach places. If you need to reach something above you, use a strong step stool that has a grab bar. Keep electrical cords out of the way. Do not use floor polish or wax that makes floors slippery. If you must use wax, use non-skid floor wax. Do not have throw rugs and other things on the floor that can make you trip. What can I do with my stairs? Do not leave any items on the stairs. Make sure that there are handrails on both sides of the stairs and use them. Fix handrails that are broken or loose. Make sure that handrails are as long as the stairways. Check any carpeting to make sure that it is firmly attached to the stairs. Fix any carpet that is loose or worn. Avoid having throw rugs at the top or bottom of the stairs. If you do have throw rugs, attach them to the floor with carpet tape. Make sure that you have a light switch at the top of  the stairs and the bottom of the stairs. If you do not have them, ask someone to add them for you. What else can I do to help prevent falls? Wear shoes that: Do not have high heels. Have rubber bottoms. Are comfortable and fit you well. Are closed at the toe. Do not wear sandals. If you use a stepladder: Make sure that it is fully opened. Do not climb a closed stepladder. Make sure that both sides of the stepladder are locked into place. Ask someone to hold it for you, if possible. Clearly mark and make sure that you can see: Any grab bars or handrails. First and last steps. Where the edge of each step is. Use tools that help you move around (mobility aids) if they are needed. These include: Canes. Walkers. Scooters. Crutches. Turn on the lights when you go into a dark area.  Replace any light bulbs as soon as they burn out. Set up your furniture so you have a clear path. Avoid moving your furniture around. If any of your floors are uneven, fix them. If there are any pets around you, be aware of where they are. Review your medicines with your doctor. Some medicines can make you feel dizzy. This can increase your chance of falling. Ask your doctor what other things that you can do to help prevent falls. This information is not intended to replace advice given to you by your health care provider. Make sure you discuss any questions you have with your health care provider. Document Released: 04/02/2009 Document Revised: 11/12/2015 Document Reviewed: 07/11/2014 Elsevier Interactive Patient Education  2017 [employer].

## 2023-11-01 NOTE — Progress Notes (Signed)
 Subjective:   Melissa [name] is a 89 y.o. female who presents for Medicare Annual (Subsequent) preventive examination.  Visit Complete: Virtual I connected with  Melissa [name] on 11/01/23 by a audio enabled telemedicine application and verified that I am speaking with the correct person using two identifiers.  Patient Location: Home  Provider Location: Home Office  I discussed the limitations of evaluation and management by telemedicine. The patient expressed understanding and agreed to proceed.  Vital Signs: Because this visit was a virtual/telehealth visit, some criteria may be missing or patient reported. Any vitals not documented were not able to be obtained and vitals that have been documented are patient reported.  Cardiac Risk Factors include: advanced age (>81men, >67 women);diabetes mellitus;hypertension     Objective:     There were no vitals filed for this visit. There is no height or weight on file to calculate BMI.     11/01/2023    1:56 PM 09/05/2023   11:34 AM 05/19/2023   10:38 AM 05/19/2023    5:28 AM 05/09/2023   10:15 AM 02/26/2023    2:50 PM 02/25/2023    4:48 PM  Advanced Directives  Does Patient Have a Medical Advance Directive? Yes Yes Yes No Yes Yes No  Type of Estate agent of [employer] Power of [place];Living will Healthcare Power of [place];Living will  Living will Healthcare Power of Attorney   Does patient want to make changes to medical advance directive?  No - Patient declined No - Patient declined  No - Patient declined No - Patient declined   Copy of Healthcare Power of Attorney in Chart? No - copy requested No - copy requested No - copy requested   No - copy requested   Would patient like information on creating a medical advance directive?      No - Patient declined No - Patient declined    Current Medications (verified) Outpatient Encounter Medications as of 11/01/2023  Medication Sig   atorvastatin   (LIPITOR) 40 MG tablet Take 1 tablet (40 mg total) by mouth daily.   Calcium  Carbonate-Vitamin D  (OYSTER SHELL CALCIUM /D) 250-125 MG-UNIT TABS Take by mouth.   dicyclomine  (BENTYL ) 10 MG capsule 1 tab daily prn , take about 30 minutes before meal.   escitalopram  (LEXAPRO ) 5 MG tablet Take 1 tablet (5 mg total) by mouth daily.   gabapentin  (NEURONTIN ) 300 MG capsule Take 1 capsule (300 mg total) by mouth at bedtime.   metFORMIN  (GLUCOPHAGE ) 500 MG tablet TAKE 1 TABLET EVERY MORNING & 2 TABLETS AT NIGHT   metoprolol  succinate (TOPROL -XL) 50 MG 24 hr tablet Take 1 tablet (50 mg total) by mouth daily. Take with or immediately following a meal.   Multiple Vitamin (MULTIVITAMIN) tablet Take 1 tablet by mouth daily.   pantoprazole  (PROTONIX ) 40 MG tablet Take 1 tablet (40 mg total) by mouth daily.   albuterol  (VENTOLIN  HFA) 108 (90 Base) MCG/ACT inhaler Inhale 2 puffs into the lungs every 6 (six) hours as needed for wheezing or shortness of breath. (Patient not taking: Reported on 09/05/2023)   albuterol  (VENTOLIN  HFA) 108 (90 Base) MCG/ACT inhaler Inhale 2 puffs into the lungs every 4 (four) hours as needed for wheezing or shortness of breath. (Patient not taking: Reported on 11/01/2023)   apixaban  (ELIQUIS ) 2.5 MG TABS tablet Take 1 tablet (2.5 mg total) by mouth 2 (two) times daily. (Patient not taking: Reported on 09/13/2023)   No facility-administered encounter medications on file as of 11/01/2023.  Allergies (verified) Patient has no known allergies.   History: Past Medical History:  Diagnosis Date   Burning tongue syndrome 11/11/2015   Colon polyps    Cystocele with prolapse 2019   Dr. Everardo Hitch- Monitor for now.    Detached retina, right    Diabetes mellitus    Diaphragmatic hernia    Diverticulitis 01/26/2022   GERD (gastroesophageal reflux disease)    Humerus fracture 02/26/2023   Hyperlipidemia    Insomnia    Macular degeneration    Past Surgical History:  Procedure Laterality Date    ABDOMINAL HYSTERECTOMY     BREAST CYST ASPIRATION  11/03/2020   FOOT SURGERY     OOPHORECTOMY     Rt.ovary removed with Hyst   RETINAL DETACHMENT SURGERY     TONSILLECTOMY     Family History  Problem Relation Age of Onset   Heart disease Other    Breast cancer Other    Breast cancer Mother        metastatic--to bone   CAD Father    Cancer Maternal Grandmother        unknown   Cancer Paternal Grandmother        colon   Social History   Socioeconomic History   Marital status: Widowed    Spouse name: Not on file   Number of children: Not on file   Years of education: Not on file   Highest education level: Not on file  Occupational History   Not on file  Tobacco Use   Smoking status: Never   Smokeless tobacco: Never  Vaping Use   Vaping status: Never Used  Substance and Sexual Activity   Alcohol use: No   Drug use: No   Sexual activity: Not Currently    Birth control/protection: Surgical    Comment: Hyst--Lt.ovary remains  Other Topics Concern   Not on file  Social History Narrative   Marital status/children/pets: Widowed.   Education/employment: 12th grade education.  Retired.   Safety:      -smoke alarm in the home:Yes     - wears seatbelt: Yes     - Feels safe in their relationships: Yes   Social Drivers of Corporate investment banker Strain: Low Risk  (11/01/2023)   Overall Financial Resource Strain (CARDIA)    Difficulty of Paying Living Expenses: Not hard at all  Food Insecurity: No Food Insecurity (11/01/2023)   Hunger Vital Sign    Worried About Running Out of Food in the Last Year: Never true    Ran Out of Food in the Last Year: Never true  Transportation Needs: No Transportation Needs (11/01/2023)   PRAPARE - Administrator, Civil Service (Medical): No    Lack of Transportation (Non-Medical): No  Physical Activity: Inactive (11/01/2023)   Exercise Vital Sign    Days of Exercise per Week: 0 days    Minutes of Exercise per Session: 0  min  Stress: No Stress Concern Present (11/01/2023)   [employer] of Occupational Health - Occupational Stress Questionnaire    Feeling of Stress : Not at all  Social Connections: Moderately Isolated (11/01/2023)   Social Connection and Isolation Panel [NHANES]    Frequency of Communication with Friends and Family: Twice a week    Frequency of Social Gatherings with Friends and Family: Three times a week    Attends Religious Services: More than 4 times per year    Active Member of Clubs or Organizations: No    Attends  Club or Organization Meetings: Never    Marital Status: Widowed    Tobacco Counseling Counseling given: Not Answered   Clinical Intake:  Pre-visit preparation completed: No  Pain : No/denies pain     Diabetes: Yes CBG done?: No Did pt. bring in CBG monitor from home?: No  How often do you need to have someone help you when you read instructions, pamphlets, or other written materials from your doctor or pharmacy?: 1 - Never  Interpreter Needed?: No  Information entered by :: Kieth Pelt LPN   Activities of Daily Living    11/01/2023    1:57 PM 05/19/2023   10:39 AM  In your present state of health, do you have any difficulty performing the following activities:  Hearing? 1   Vision? 0   Difficulty concentrating or making decisions? 0   Walking or climbing stairs? 0   Dressing or bathing? 0   Doing errands, shopping? 0 0  Preparing Food and eating ? N   Using the Toilet? N   In the past six months, have you accidently leaked urine? Y   Do you have problems with loss of bowel control? N   Managing your Medications? N   Managing your Finances? N   Housekeeping or managing your Housekeeping? N     Patient Care Team: Mariel Shope, DO as PCP - General (Family Medicine) Millicent Ally, MD as PCP - Cardiology (Cardiology) Seward Dao Alleen Arbour, MD as Consulting Physician (Ophthalmology) Ana Balling, MD as Consulting Physician (Obstetrics and  Gynecology) Millicent Ally, MD as Consulting Physician (Cardiology) Jorie Newness, Blondie Burke, MD as Consulting Physician (Obstetrics and Gynecology) [facility], [facility]   Indicate any recent Medical Services you may have received from other than [facility] providers in the past year (date may be approximate).     Assessment:    This is a routine wellness examination for Melissa [name].  Hearing/Vision screen Hearing Screening - Comments:: Hearing aids  Vision Screening - Comments:: Up to date My eye doctor    [place]   Goals Addressed             This Visit's Progress    patient   On track    Keep maintaining your health      Patient Stated   On track    Keep on moving      Patient Stated       Continue current lifestyle       Depression Screen    11/01/2023    1:59 PM 05/09/2023   10:28 AM 12/26/2022    9:33 AM 10/26/2022    1:32 PM 06/29/2022    1:53 PM 12/01/2021   10:07 AM 09/15/2021    9:37 AM  PHQ 2/9 Scores  PHQ - 2 Score 2 0 0 0 0 1 0  PHQ- 9 Score 4     6     Fall Risk    11/01/2023    1:54 PM 12/26/2022    9:33 AM 10/26/2022    1:37 PM 06/29/2022    1:53 PM 09/15/2021    9:38 AM  Fall Risk   Falls in the past year? 0 0 0 0 0  Number falls in past yr: 0 0 0 0 0  Injury with Fall? 0 0 0 0 0  Risk for fall due to :   Impaired vision No Fall Risks Impaired vision  Follow up Falls evaluation completed;Education provided;Falls prevention discussed Falls evaluation  completed Falls prevention discussed Falls evaluation completed Falls prevention discussed    MEDICARE RISK AT HOME: Medicare Risk at Home Any stairs in or around the home?: Yes If so, are there any without handrails?: No Home free of loose throw rugs in walkways, pet beds, electrical cords, etc?: Yes Adequate lighting in your home to reduce risk of falls?: Yes Life alert?: No Use of a cane, walker or w/c?: No Grab bars in the bathroom?: Yes Shower chair or bench in shower?:  Yes Elevated toilet seat or a handicapped toilet?: Yes  TIMED UP AND GO:  Was the test performed?  No    Cognitive Function:    08/31/2016   10:01 AM  MMSE - Mini Mental State Exam  Not completed: --        11/01/2023    1:58 PM 10/26/2022    1:38 PM 09/15/2021    9:43 AM  6CIT Screen  What Year? 0 points 0 points 0 points  What month? 0 points 0 points 0 points  What time? 0 points 0 points 0 points  Count back from 20 0 points 0 points 0 points  Months in reverse 0 points 0 points 0 points  Repeat phrase 2 points 2 points 4 points  Total Score 2 points 2 points 4 points    Immunizations Immunization History  Administered Date(s) Administered   Fluad Quad(high Dose 65+) 03/06/2019, 04/03/2019, 04/15/2020, 04/13/2021, 03/23/2022   Influenza Inj Mdck Quad Pf 03/06/2019, 04/03/2019, 04/15/2020   Influenza Split 04/18/2011, 04/05/2013   Influenza Whole 03/24/2009, 04/17/2014   Influenza, High Dose Seasonal PF 04/17/2014, 03/20/2016, 03/06/2019   Influenza, Seasonal, Injecte, Preservative Fre 04/07/2015   Influenza,inj,Quad PF,6+ Mos 04/16/2018   Influenza-Unspecified 04/18/2011, 03/20/2013, 04/05/2013, 04/07/2015, 04/16/2018, 04/03/2019, 04/29/2023   Meningococcal Conjugate 04/05/2017   Moderna Covid-19 Vaccine Bivalent Booster 72yrs & up 06/29/2021   Moderna Sars-Covid-2 Vaccination 08/21/2019, 09/18/2019, 04/15/2020, 10/13/2020   PNEUMOCOCCAL CONJUGATE-20 04/13/2021   Pneumococcal Conjugate-13 10/02/2013, 10/02/2013, 01/03/2018   Pneumococcal Polysaccharide-23 06/21/2003, 05/21/2019   Respiratory Syncytial Virus Vaccine,Recomb Aduvanted(Arexvy) 03/23/2022   Tdap 01/06/2021   Zoster Recombinant(Shingrix) 05/21/2019, 07/23/2019   Zoster, Live 08/16/2014   Zoster, Unspecified 05/21/2019    TDAP status: Up to date  Flu Vaccine status: Up to date  Pneumococcal vaccine status: Up to date  Covid-19 vaccine status: Information provided on how to obtain vaccines.    Qualifies for Shingles Vaccine? No   Zostavax completed Yes   Shingrix Completed?: Yes  Screening Tests Health Maintenance  Topic Date Due   MAMMOGRAM  04/25/2024 (Originally 09/26/2022)   INFLUENZA VACCINE  01/19/2024   OPHTHALMOLOGY EXAM  04/03/2024   HEMOGLOBIN A1C  04/10/2024   FOOT EXAM  04/25/2024   Medicare Annual Wellness (AWV)  10/31/2024   DTaP/Tdap/Td (2 - Td or Tdap) 01/07/2031   Pneumonia Vaccine 44+ Years old  Completed   Zoster Vaccines- Shingrix  Completed   HPV VACCINES  Aged Out   Meningococcal B Vaccine  Aged Out   DEXA SCAN  Discontinued   COVID-19 Vaccine  Discontinued    Health Maintenance  There are no preventive care reminders to display for this patient.   Colorectal cancer screening: No longer required.   Mammogram status: No longer required due to  .  Bone Density  declined  Lung Cancer Screening: (Low Dose CT Chest recommended if Age 63-80 years, 20 pack-year currently smoking OR have quit w/in 15years.) does not qualify.   Lung Cancer Screening Referral:   Additional Screening:  Hepatitis C Screening: does not qualify;   Vision Screening: Recommended annual ophthalmology exams for early detection of glaucoma and other disorders of the eye. Is the patient up to date with their annual eye exam?  Yes  Who is the provider or what is the name of the office in which the patient attends annual eye exams? My eye doctor If pt is not established with a provider, would they like to be referred to a provider to establish care? No .   Dental Screening: Recommended annual dental exams for proper oral hygiene  Nutrition Risk Assessment:  Has the patient had any N/V/D within the last 2 months?  No  Does the patient have any non-healing wounds?  No  Has the patient had any unintentional weight loss or weight gain?  No   Diabetes:  Is the patient diabetic?  Yes  If diabetic, was a CBG obtained today?  No  Did the patient bring in their glucometer  from home?  No  How often do you monitor your CBG's? Only at doctor.   Financial Strains and Diabetes Management:  Are you having any financial strains with the device, your supplies or your medication? No .  Does the patient want to be seen by Chronic Care Management for management of their diabetes?  No  Would the patient like to be referred to a Nutritionist or for Diabetic Management?  No   Diabetic Exams:  Diabetic Eye Exam: Completed .  Diabetic Foot Exam: Pt has been advised about the importance in completing this exam. .    Community Resource Referral / Chronic Care Management: CRR required this visit?  No   CCM required this visit?  No     Plan:     I have personally reviewed and noted the following in the patient's chart:   Medical and social history Use of alcohol, tobacco or illicit drugs  Current medications and supplements including opioid prescriptions. Patient is not currently taking opioid prescriptions. Functional ability and status Nutritional status Physical activity Advanced directives List of other physicians Hospitalizations, surgeries, and ER visits in previous 12 months Vitals Screenings to include cognitive, depression, and falls Referrals and appointments  In addition, I have reviewed and discussed with patient certain preventive protocols, quality metrics, and best practice recommendations. A written personalized care plan for preventive services as well as general preventive health recommendations were provided to patient.     Kieth Pelt, LPN   03/03/7828   After Visit Summary: (MyChart) Due to this being a telephonic visit, the after visit summary with patients personalized plan was offered to patient via MyChart   Nurse Notes:

## 2023-11-23 DIAGNOSIS — H43812 Vitreous degeneration, left eye: Secondary | ICD-10-CM | POA: Diagnosis not present

## 2023-11-23 DIAGNOSIS — E113292 Type 2 diabetes mellitus with mild nonproliferative diabetic retinopathy without macular edema, left eye: Secondary | ICD-10-CM | POA: Diagnosis not present

## 2023-11-23 DIAGNOSIS — H353114 Nonexudative age-related macular degeneration, right eye, advanced atrophic with subfoveal involvement: Secondary | ICD-10-CM | POA: Diagnosis not present

## 2023-11-23 DIAGNOSIS — E113551 Type 2 diabetes mellitus with stable proliferative diabetic retinopathy, right eye: Secondary | ICD-10-CM | POA: Diagnosis not present

## 2023-11-23 DIAGNOSIS — H353121 Nonexudative age-related macular degeneration, left eye, early dry stage: Secondary | ICD-10-CM | POA: Diagnosis not present

## 2023-11-23 DIAGNOSIS — Z9889 Other specified postprocedural states: Secondary | ICD-10-CM | POA: Diagnosis not present

## 2023-12-08 ENCOUNTER — Other Ambulatory Visit: Payer: Self-pay | Admitting: Family Medicine

## 2023-12-08 DIAGNOSIS — E1169 Type 2 diabetes mellitus with other specified complication: Secondary | ICD-10-CM

## 2023-12-08 MED ORDER — ATORVASTATIN CALCIUM 40 MG PO TABS: 40.0000 mg | ORAL_TABLET | Freq: Every day | ORAL | 1 refills | Status: DC

## 2023-12-08 NOTE — Telephone Encounter (Signed)
 Copied from CRM [id number]. Topic: Clinical - Medication Refill >> Dec 08, 2023  2:09 PM Adrionna Y wrote: Medication: atorvastatin  (LIPITOR) 40 MG tablet   Has the patient contacted their pharmacy? Yes (Agent: If no, request that the patient contact the pharmacy for the refill. If patient does not wish to contact the pharmacy document the reason why and proceed with request.) (Agent: If yes, when and what did the pharmacy advise?)  This is the patient's preferred pharmacy:  [facility] [place], [place] - [address] [place] [address] Phone: [phone] Fax: [phone]  Is this the correct pharmacy for this prescription? Yes If no, delete pharmacy and type the correct one.   Has the prescription been filled recently? No  Is the patient out of the medication? No  Has the patient been seen for an appointment in the last year OR does the patient have an upcoming appointment? Yes  Can we respond through MyChart? No  Agent: Please be advised that Rx refills may take up to 3 business days. We ask that you follow-up with your pharmacy.

## 2024-01-21 NOTE — Progress Notes (Unsigned)
 Cardiology Office Note:   Date:  01/24/2024  ID:  Melissa [name], DOB [birth year], MRN [id number] PCP: Catherine Charlies LABOR, DO  [facility] Providers Cardiologist:  Debby Sor, MD (Inactive) {  History of Present Illness:   Melissa [name] is a 89 y.o. female who had previously seen Dr. Sor.  She has a history of atrial fib.   She was initially seen in 2019.  She had some nonanginal chest pain.  She has had some mild mitral stenosis.  Following her humerus fracture in 2024 she developed a pulmonary embolism.  She was last seen in our clinic in January.  She had had atrial fibrillation in December 2024.  At that time she had been on anticoagulation because of the pulmonary emboli.  She subsequently did have follow-up with hematology and they suggested she did not need chronic anticoagulation necessarily based on the PE but they were considering it to be unprovoked and the conclusion had been for her to continue it at low dose.  However, she took herself off of it deciding that it was optional and she did not want to pay for it.  She did wear a monitor and follow-up with us  in January of this year and she had very little fibrillation lasting for less than an hour.  She does not feel any palpitations.  She does not have any chest pressure, neck or arm discomfort.  She has not had any weight gain or edema.  She lives alone.  She does her own driving and household chores.   ROS: As stated in the HPI and negative for all other systems.  Studies Reviewed:    EKG:   EKG Interpretation Date/Time:  Wednesday January 24 2024 10:53:54 EDT Ventricular Rate:  77 PR Interval:  132 QRS Duration:  120 QT Interval:  410 QTC Calculation: 463 R Axis:   108  Text Interpretation: Normal sinus rhythm Low voltage QRS Right bundle branch block When compared with ECG of 21-Jun-2023 19:30, PREVIOUS ECG IS PRESENT Confirmed by Lavona Agent ([id number]) on 01/24/2024 11:01:59 AM    Risk  Assessment/Calculations:    CHA2DS2-VASc Score = 6   This indicates a 9.7% annual risk of stroke. The patient's score is based upon: CHF History: 0 HTN History: 1 Diabetes History: 1 Stroke History: 0 Vascular Disease History: 1 Age Score: 2 Gender Score: 1       Physical Exam:   VS:  BP (!) 100/58   Pulse 77   Ht 5' 1 (1.549 m)   Wt 119 lb (54 kg)   BMI 22.48 kg/m    Wt Readings from Last 3 Encounters:  01/24/24 119 lb (54 kg)  10/10/23 117 lb 12.8 oz (53.4 kg)  09/13/23 120 lb (54.4 kg)     GEN: Well nourished, well developed in no acute distress NECK: No JVD; No carotid bruits CARDIAC: RRR, very soft apical systolic murmur radiating slightly at the aortic outflow tract murmurs, rubs, gallops RESPIRATORY:  Clear to auscultation without rales, wheezing or rhonchi  ABDOMEN: Soft, non-tender, non-distended EXTREMITIES:  No edema; No deformity   ASSESSMENT AND PLAN:   Paroxysmal atrial fibrillation: I thoroughly reviewed the chart.  I talked with her.  She has had some paroxysmal atrial fibrillation but really did not want to continue the anticoagulation in part because of the expense.   There was some provocation for the fibrillation as she was sick following eating a restaurant.   When she saw hematology oncology there was  no hypercoagulable state and it was thought that there was no absolute indication to remain on anticoagulation.  It was all optional.  Therefore, after patient engagement and enrollment in her care she wants to stay off anticoagulation.  She should take an aspirin .  She would let me know if she has palpitations in the future.  Hypercholesterolemia: Her lipids are at target.  Continue the meds as listed.    Hypertension: No change in therapy.  Pulmonary embolism: See above  TR:  I will follow this clinically.      Follow up with me in 1 year  Signed, Lynwood Schilling, MD

## 2024-01-24 ENCOUNTER — Encounter: Payer: Self-pay | Admitting: Cardiology

## 2024-01-24 ENCOUNTER — Ambulatory Visit: Admitting: Cardiology

## 2024-01-24 VITALS — BP 100/58 | HR 77 | Ht 61.0 in | Wt 119.0 lb

## 2024-01-24 DIAGNOSIS — I10 Essential (primary) hypertension: Secondary | ICD-10-CM

## 2024-01-24 DIAGNOSIS — I480 Paroxysmal atrial fibrillation: Secondary | ICD-10-CM | POA: Diagnosis not present

## 2024-01-24 DIAGNOSIS — E785 Hyperlipidemia, unspecified: Secondary | ICD-10-CM

## 2024-01-24 NOTE — Patient Instructions (Addendum)
 Medication Instructions:  Your physician recommends that you continue on your current medications as directed. Please refer to the Current Medication list given to you today.  Labwork: none  Testing/Procedures: none  Follow-Up: Your physician recommends that you schedule a follow-up appointment in: 1 year. You will receive a reminder call in about 8 months reminding you to schedule your appointment. If you don't receive this call, please contact our office.  Any Other Special Instructions Will Be Listed Below (If Applicable).  If you need a refill on your cardiac medications before your next appointment, please call your pharmacy.

## 2024-03-05 ENCOUNTER — Inpatient Hospital Stay

## 2024-03-05 ENCOUNTER — Inpatient Hospital Stay: Attending: Oncology | Admitting: Oncology

## 2024-03-05 VITALS — BP 124/81 | HR 78 | Temp 97.2°F | Resp 16 | Wt 117.9 lb

## 2024-03-05 DIAGNOSIS — E119 Type 2 diabetes mellitus without complications: Secondary | ICD-10-CM | POA: Insufficient documentation

## 2024-03-05 DIAGNOSIS — Z87891 Personal history of nicotine dependence: Secondary | ICD-10-CM | POA: Diagnosis not present

## 2024-03-05 DIAGNOSIS — Z86711 Personal history of pulmonary embolism: Secondary | ICD-10-CM

## 2024-03-05 DIAGNOSIS — Z7901 Long term (current) use of anticoagulants: Secondary | ICD-10-CM | POA: Diagnosis not present

## 2024-03-05 DIAGNOSIS — Z79899 Other long term (current) drug therapy: Secondary | ICD-10-CM | POA: Diagnosis not present

## 2024-03-05 DIAGNOSIS — I480 Paroxysmal atrial fibrillation: Secondary | ICD-10-CM | POA: Diagnosis not present

## 2024-03-05 LAB — CBC WITH DIFFERENTIAL (CANCER CENTER ONLY)
Abs Immature Granulocytes: 0.01 K/uL (ref 0.00–0.07)
Basophils Absolute: 0.1 K/uL (ref 0.0–0.1)
Basophils Relative: 1 %
Eosinophils Absolute: 0.2 K/uL (ref 0.0–0.5)
Eosinophils Relative: 3 %
HCT: 39.6 % (ref 36.0–46.0)
Hemoglobin: 12.7 g/dL (ref 12.0–15.0)
Immature Granulocytes: 0 %
Lymphocytes Relative: 28 %
Lymphs Abs: 1.9 K/uL (ref 0.7–4.0)
MCH: 30 pg (ref 26.0–34.0)
MCHC: 32.1 g/dL (ref 30.0–36.0)
MCV: 93.4 fL (ref 80.0–100.0)
Monocytes Absolute: 0.6 K/uL (ref 0.1–1.0)
Monocytes Relative: 9 %
Neutro Abs: 4 K/uL (ref 1.7–7.7)
Neutrophils Relative %: 59 %
Platelet Count: 264 K/uL (ref 150–400)
RBC: 4.24 MIL/uL (ref 3.87–5.11)
RDW: 14.5 % (ref 11.5–15.5)
WBC Count: 6.8 K/uL (ref 4.0–10.5)
nRBC: 0 % (ref 0.0–0.2)

## 2024-03-05 LAB — D-DIMER, QUANTITATIVE: D-Dimer, Quant: 1.22 ug{FEU}/mL — ABNORMAL HIGH (ref 0.00–0.50)

## 2024-03-05 MED ORDER — APIXABAN 5 MG PO TABS: ORAL_TABLET | ORAL | 3 refills | Status: DC

## 2024-03-05 NOTE — Assessment & Plan Note (Addendum)
-   Bilateral pulmonary emboli diagnosed in September 2024. No recent surgeries, long trips, or immobilization. No current symptoms of chest pain or shortness of breath. On Eliquis  5 mg twice daily without any reported side effects or bleeding.  -Given bilateral pulmonary emboli, advised anticoagulation for minimum of 6 months.  -Based on history, pulmonary emboli seems to be unprovoked.   Hence we obtained thrombophilia workup on her initial consultation with us  on 05/09/2023.  Prothrombin gene mutation, factor V Leiden mutation, beta-2  glycoprotein antibodies, anticardiolipin antibodies were all negative.  D-dimer was 0.32.  Rest of the hypercoagulable workup was deferred given the fact that she had recent thromboembolism and current Eliquis  use, which can result in falsely abnormal labs.  Following completion of 6 months of anticoagulation, Eliquis  was dose reduced to prophylactic dosing of 2.5 mg p.o. twice daily starting from March 2025.  We completed remaining thrombophilia workup in March 2025.  Protein C activity, protein S activity, Antithrombin III  activity were all normal.  No evidence of lupus anticoagulant.  There was a misunderstanding and patient discontinued anticoagulation.  On her follow-up visit with us  on 03/05/2024, D-dimer was increased at 1.22.  Given chronic varicose veins with increased risk of clotting, patient was agreeable to going back on Eliquis  2.5 mg twice daily.  Resumed this from 03/05/2024.  RTC in 3 months for follow-up to assess tolerance and compliance with Eliquis .  Will check D-dimer on return visit.

## 2024-03-05 NOTE — Progress Notes (Signed)
 [facility]  HEMATOLOGY CLINIC PROGRESS NOTE  PATIENT NAME: Melissa [name]   MR#: [id number] DOB: 31-May-1935  Patient Care Team: Catherine Charlies LABOR, DO as PCP - General (Family Medicine) Burnard Debby LABOR, MD (Inactive) as PCP - Cardiology (Cardiology) Elner Arley LABOR, MD as Consulting Physician (Ophthalmology) Starla Harland BROCKS, MD as Consulting Physician (Obstetrics and Gynecology) Cathlyn BROCKS Cary, Bobie BRAVO, MD as Consulting Physician (Obstetrics and Gynecology) Roni, [facility]   Date of visit: 03/05/2024   ASSESSMENT & PLAN:   Melissa [name] is a 89 y.o. lady with a past medical history of type 2 diabetes mellitus, dyslipidemia, paroxysmal atrial fibrillation, varicose veins, history of shoulder fracture, was referred to our service for management decisions regarding anticoagulation, given her history of pulmonary embolism diagnosed in September 2024.  Grossly negative hypercoagulable workup.  Hx pulmonary embolism - Bilateral pulmonary emboli diagnosed in September 2024. No recent surgeries, long trips, or immobilization. No current symptoms of chest pain or shortness of breath. On Eliquis  5 mg twice daily without any reported side effects or bleeding.  -Given bilateral pulmonary emboli, advised anticoagulation for minimum of 6 months.  -Based on history, pulmonary emboli seems to be unprovoked.   Hence we obtained thrombophilia workup on her initial consultation with us  on 05/09/2023.  Prothrombin gene mutation, factor V Leiden mutation, beta-2  glycoprotein antibodies, anticardiolipin antibodies were all negative.  D-dimer was 0.32.  Rest of the hypercoagulable workup was deferred given the fact that she had recent thromboembolism and current Eliquis  use, which can result in falsely abnormal labs.  Following completion of 6 months of anticoagulation, Eliquis  was dose reduced to prophylactic dosing of 2.5 mg p.o. twice daily starting from March  2025.  We completed remaining thrombophilia workup in March 2025.  Protein C activity, protein S activity, Antithrombin III  activity were all normal.  No evidence of lupus anticoagulant.  There was a misunderstanding and patient discontinued anticoagulation.  On her follow-up visit with us  on 03/05/2024, D-dimer was increased at 1.22.  Given chronic varicose veins with increased risk of clotting, patient was agreeable to going back on Eliquis  2.5 mg twice daily.  Resumed this from 03/05/2024.  RTC in 3 months for follow-up to assess tolerance and compliance with Eliquis .  Will check D-dimer on return visit.  I spent a total of 20 minutes during this encounter with the patient including review of chart and various tests results, discussions about plan of care and coordination of care plan.  I reviewed lab results and outside records for this visit and discussed relevant results with the patient. Diagnosis, plan of care and treatment options were also discussed in detail with the patient. Opportunity provided to ask questions and answers provided to her apparent satisfaction. Provided instructions to call our clinic with any problems, questions or concerns prior to return visit. I recommended to continue follow-up with PCP and sub-specialists. She verbalized understanding and agreed with the plan. No barriers to learning was detected.  Chinita Patten, MD  03/05/2024 12:21 PM  [facility] OF [facility] [address] [place] [address] Dept: [phone] Dept Fax: [phone]   CHIEF COMPLAINT/ REASON FOR VISIT:  History of pulmonary embolism diagnosed in September 2024.  Unprovoked.  Grossly negative hypercoagulable workup.  INTERVAL HISTORY:  Discussed the use of AI scribe software for clinical note transcription with the patient, who gave verbal consent to proceed.  History of Present Illness Melissa [name] is an 89 year old female who presents for follow-up regarding anticoagulation therapy.  She was previously taking Eliquis  (apixaban ) at a dose of 2.5 mg twice daily but has since stopped. A cardiologist she visited on August 6th questioned the discontinuation, and she explained that her blood tests suggested she might not need it. She is concerned about her varicose veins and wonders if continuing Eliquis  might prevent clotting in those veins.  Her D-dimer level is currently 1.22. She experiences shortness of breath with exertion, such as during household chores. No swelling in her legs, although she has a 'bad vein' that extends up her leg.  She is considering changing her insurance to find a better plan that covers her medications more affordably. Currently, her insurance, [employer], allows her to get Eliquis  for approximately forty dollars. She previously had insurance with [employer], which she preferred, but it did not cover Eliquis .   SUMMARY OF HEMATOLOGIC HISTORY:  Patient was admitted to the hospital on 02/25/2023 after she presented with complaints of elevated heart rate, fatigue, dyspnea on exertion.  In the ED, D-dimer was elevated at 6.24.  CTA chest demonstrated a proximal segmental RML and small subsegmental LUL pulmonary embolus without evidence of right heart strain in addition to a small peripheral consolidation concerning for atelectasis versus infarct.  She was admitted for further management.  Initially she was on heparin  drip and later transitioned to Eliquis .   Based on history, pulmonary embolism seems to be unprovoked.  Hence she was referred to us  for further evaluation and also recommendations regarding anticoagulation.   She has been compliant with Eliquis  5 mg twice daily and reports no problems with the medication and denies any bleeding or bruising. She lives alone and has a history of falls, including one prior to her hospital admission. She also has a  large varicose vein in her leg that has not ruptured. She reports occasional dizziness upon waking up in the morning but waits for it to pass before getting up. She also mentions occasional wheezing when she exerts herself but has an inhaler for it. She used to smoke in her twenties but quit a long time ago.   Bilateral pulmonary emboli diagnosed in September 2024. No recent surgeries, long trips, or immobilization. No current symptoms of chest pain or shortness of breath. On Eliquis  5 mg twice daily without any reported side effects or bleeding.   Given bilateral pulmonary emboli, advised anticoagulation for minimum of 6 months.   Based on history, pulmonary emboli seems to be unprovoked.  Hence we obtained thrombophilia workup on her initial consultation with us  on 05/09/2023.  Prothrombin gene mutation, factor V Leiden mutation, beta-2  glycoprotein antibodies, anticardiolipin antibodies were all negative.  D-dimer was 0.32.  Rest of the hypercoagulable workup was deferred given the fact that she had recent thromboembolism and current Eliquis  use, which can result in falsely abnormal labs.   Following completion of 6 months of anticoagulation, Eliquis  was dose reduced to prophylactic dosing of 2.5 mg p.o. twice daily starting from March 2025.  We completed remaining thrombophilia workup in March 2025.  Protein C activity, protein S activity, Antithrombin III  activity were all normal.  No evidence of lupus anticoagulant.  There was a misunderstanding and patient discontinued anticoagulation.  On her follow-up visit with us  on 03/05/2024, D-dimer was increased at 1.22.  Given chronic varicose veins with increased risk of clotting, patient was agreeable to going back on Eliquis  2.5 mg  twice daily.  Resumed this from 03/05/2024.   I have reviewed the past medical history, past surgical history, social history and family history with the patient and they are unchanged from previous note.  ALLERGIES: She  has no known allergies.  MEDICATIONS:  Current Outpatient Medications  Medication Sig Dispense Refill   albuterol  (VENTOLIN  HFA) 108 (90 Base) MCG/ACT inhaler Inhale 2 puffs into the lungs every 6 (six) hours as needed for wheezing or shortness of breath. 8 g 0   apixaban  (ELIQUIS ) 5 MG TABS tablet Take half tablet (2.5 mg) twice daily 60 tablet 3   atorvastatin  (LIPITOR) 40 MG tablet Take 1 tablet (40 mg total) by mouth daily. 90 tablet 1   Calcium  Carbonate-Vitamin D  (OYSTER SHELL CALCIUM /D) 250-125 MG-UNIT TABS Take by mouth.     dicyclomine  (BENTYL ) 10 MG capsule 1 tab daily prn , take about 30 minutes before meal. 30 capsule 5   escitalopram  (LEXAPRO ) 5 MG tablet Take 1 tablet (5 mg total) by mouth daily. 90 tablet 1   gabapentin  (NEURONTIN ) 300 MG capsule Take 1 capsule (300 mg total) by mouth at bedtime. 90 capsule 1   metFORMIN  (GLUCOPHAGE ) 500 MG tablet TAKE 1 TABLET EVERY MORNING & 2 TABLETS AT NIGHT 270 tablet 1   metoprolol  succinate (TOPROL -XL) 50 MG 24 hr tablet Take 1 tablet (50 mg total) by mouth daily. Take with or immediately following a meal. 90 tablet 3   Multiple Vitamin (MULTIVITAMIN) tablet Take 1 tablet by mouth daily.     pantoprazole  (PROTONIX ) 40 MG tablet Take 1 tablet (40 mg total) by mouth daily. 90 tablet 1   No current facility-administered medications for this visit.     REVIEW OF SYSTEMS:    Review of Systems - Oncology  All other pertinent systems were reviewed with the patient and are negative.  PHYSICAL EXAMINATION:    Onc Performance Status - 03/05/24 1204       ECOG Perf Status   ECOG Perf Status Restricted in physically strenuous activity but ambulatory and able to carry out work of a light or sedentary nature, e.g., light house work, office work      KPS SCALE   KPS % SCORE Able to carry on normal activity, minor s/s of disease          Vitals:   03/05/24 1150  BP: 124/81  Pulse: 78  Resp: 16  Temp: (!) 97.2 F (36.2 C)  SpO2:  99%   Filed Weights   03/05/24 1150  Weight: 117 lb 14.4 oz (53.5 kg)    Physical Exam Constitutional:      General: She is not in acute distress.    Appearance: Normal appearance.  HENT:     Head: Normocephalic and atraumatic.  Eyes:     General: No scleral icterus.    Conjunctiva/sclera: Conjunctivae normal.  Cardiovascular:     Rate and Rhythm: Normal rate and regular rhythm.     Heart sounds: Normal heart sounds.  Pulmonary:     Effort: Pulmonary effort is normal.     Breath sounds: Normal breath sounds.  Abdominal:     General: There is no distension.  Musculoskeletal:     Right lower leg: No edema.     Left lower leg: No edema.     Comments: Varicose veins on RLE  Neurological:     General: No focal deficit present.     Mental Status: She is alert and oriented to person, place, and time.  Psychiatric:        Mood and Affect: Mood normal.        Behavior: Behavior normal.        Thought Content: Thought content normal.     LABORATORY DATA:   I have reviewed the data as listed.  Results for orders placed or performed in visit on 03/05/24  D-dimer, quantitative  Result Value Ref Range   D-Dimer, Quant 1.22 (H) 0.00 - 0.50 ug/mL-FEU  CBC with Differential (Cancer Center Only)  Result Value Ref Range   WBC Count 6.8 4.0 - 10.5 K/uL   RBC 4.24 3.87 - 5.11 MIL/uL   Hemoglobin 12.7 12.0 - 15.0 g/dL   HCT 60.3 63.9 - 53.9 %   MCV 93.4 80.0 - 100.0 fL   MCH 30.0 26.0 - 34.0 pg   MCHC 32.1 30.0 - 36.0 g/dL   RDW 85.4 88.4 - 84.4 %   Platelet Count 264 150 - 400 K/uL   nRBC 0.0 0.0 - 0.2 %   Neutrophils Relative % 59 %   Neutro Abs 4.0 1.7 - 7.7 K/uL   Lymphocytes Relative 28 %   Lymphs Abs 1.9 0.7 - 4.0 K/uL   Monocytes Relative 9 %   Monocytes Absolute 0.6 0.1 - 1.0 K/uL   Eosinophils Relative 3 %   Eosinophils Absolute 0.2 0.0 - 0.5 K/uL   Basophils Relative 1 %   Basophils Absolute 0.1 0.0 - 0.1 K/uL   Immature Granulocytes 0 %   Abs Immature  Granulocytes 0.01 0.00 - 0.07 K/uL    RADIOGRAPHIC STUDIES:  No recent pertinent imaging studies available to review.  Orders Placed This Encounter  Procedures   CBC with Differential (Cancer Center Only)    Standing Status:   Future    Expected Date:   06/04/2024    Expiration Date:   03/05/2025   D-dimer, quantitative    Standing Status:   Future    Expected Date:   06/04/2024    Expiration Date:   03/05/2025     Future Appointments  Date Time Provider Department Center  03/26/2024 10:20 AM Catherine Charlies LABOR, DO [facility] [address]  06/04/2024  9:30 AM [facility] PHLEBOTOMIST [facility] None  06/04/2024  9:45 AM Leeasia Secrist, Chinita, MD [facility] None  11/06/2024  1:50 PM [facility] ANNUAL WELLNESS VISIT [facility] [address]     This document was completed utilizing speech recognition software. Grammatical errors, random word insertions, pronoun errors, and incomplete sentences are an occasional consequence of this system due to software limitations, ambient noise, and hardware issues. Any formal questions or concerns about the content, text or information contained within the body of this dictation should be directly addressed to the provider for clarification.

## 2024-03-06 ENCOUNTER — Telehealth: Payer: Self-pay | Admitting: Oncology

## 2024-03-06 NOTE — Telephone Encounter (Signed)
 Patient has been scheduled for follow-up visit per 03/06/24 LOS.  LVM notifying pt of appt details, provided my direct number to pt if appt changes need to be made.

## 2024-03-11 ENCOUNTER — Encounter: Payer: Self-pay | Admitting: Oncology

## 2024-03-25 NOTE — Progress Notes (Unsigned)
 Patient Care Team    Relationship Specialty Notifications Start End  Catherine Charlies LABOR, DO PCP - General Family Medicine  07/03/18   Burnard Debby LABOR, MD (Inactive) PCP - Cardiology Cardiology  05/20/23   Elner Arley LABOR, MD Consulting Physician Ophthalmology  07/03/18   Starla Harland BROCKS, MD Consulting Physician Obstetrics and Gynecology  07/04/18   Cathlyn BROCKS Nikki Bobie FORBES, MD Consulting Physician Obstetrics and Gynecology  02/18/20   [facility], [facility]     12/01/21     SUBJECTIVE No chief complaint on file.   HPI: Melissa [name] is a 89 y.o. present for Chronic Conditions/illness Management  Diabetes/macular degeneration/nonproliferative retinopathy:  Pt reports compliance with metformin  500 mg in the morning and 1000 mg in the afternoon. Patient denies dizziness, hyperglycemic or hypoglycemic events. Patient denies numbness, tingling in the extremities or nonhealing wounds of feet.   A. Fib/htn//hyperlipidemia/peripheral artery disease She reports compliance with her metoprolol  50mg  daily. She is prescribed Lipitor 40 mg daily and takes a daily baby aspirin .   Echo Doppler study on October 03, 2017 showed an EF of 65 to 70%.  There was severe mitral annular calcification with very mild functional MS by valve area.  Patient denies chest pain, shortness of breath, dizziness or lower extremity edema.   Est with cardio  Anxiety: Pt reports compliance with lexapro  5 mg every day. She feels med is working well  GERD: Patient reports Her GERD is well-controlled.  She is compliant with Protonix  40 mg daily.  She has been on this for quite some time and takes it daily. Without medication her symptoms return.   Chronic back pain  Patient reports compliance with gabapentin  has been extremely helpful. She only occ.will use the Zanaflex  and but make sure not to take at the same time as her gabapentin .  She deniesany oversedation.   ROS: See pertinent positives and  negatives per HPI.  Patient Active Problem List   Diagnosis Date Noted   Hypomagnesemia 05/20/2023   New onset a-fib (HCC) 05/19/2023   At risk for falls 05/09/2023   Essential hypertension 02/26/2023   Normocytic anemia 02/26/2023   Hx pulmonary embolism 02/25/2023   Lumbar degenerative disc disease 01/27/2023   Retrolisthesis of vertebrae-L2 on L3 approximately 7-8 mm 01/27/2023   Callus 10/21/2020   Early stage nonexudative age-related macular degeneration of left eye 04/13/2020   Advanced nonexudative age-related macular degeneration of right eye with subfoveal involvement 04/01/2020   Degenerative retinal drusen of left eye 04/01/2020   Pseudophakia, both eyes 04/01/2020   Posterior vitreous detachment of left eye 04/01/2020   Anxiety 03/05/2019   Chronic bilateral thoracic back pain 08/03/2018   Iron deficiency anemia 07/04/2018   Peripheral arterial disease 07/11/2017   Varicose veins of both lower extremities 08/31/2016   Macular degeneration 05/05/2015   Hiatal hernia 01/28/2015   Mild nonproliferative retinopathy of both eyes with macular edema associated with type 2 diabetes mellitus (HCC) 10/02/2013   Type 2 diabetes mellitus (HCC) 03/24/2009   GERD 03/24/2009    Social History   Tobacco Use   Smoking status: Never   Smokeless tobacco: Never  Substance Use Topics   Alcohol use: No    Current Outpatient Medications:    albuterol  (VENTOLIN  HFA) 108 (90 Base) MCG/ACT inhaler, Inhale 2 puffs into the lungs every 6 (six) hours as needed for wheezing or shortness of breath., Disp: 8 g, Rfl: 0   apixaban  (ELIQUIS ) 5 MG TABS tablet, Take half  tablet (2.5 mg) twice daily, Disp: 60 tablet, Rfl: 3   atorvastatin  (LIPITOR) 40 MG tablet, Take 1 tablet (40 mg total) by mouth daily., Disp: 90 tablet, Rfl: 1   Calcium  Carbonate-Vitamin D  (OYSTER SHELL CALCIUM /D) 250-125 MG-UNIT TABS, Take by mouth., Disp: , Rfl:    dicyclomine  (BENTYL ) 10 MG capsule, 1 tab daily prn , take  about 30 minutes before meal., Disp: 30 capsule, Rfl: 5   escitalopram  (LEXAPRO ) 5 MG tablet, Take 1 tablet (5 mg total) by mouth daily., Disp: 90 tablet, Rfl: 1   gabapentin  (NEURONTIN ) 300 MG capsule, Take 1 capsule (300 mg total) by mouth at bedtime., Disp: 90 capsule, Rfl: 1   metFORMIN  (GLUCOPHAGE ) 500 MG tablet, TAKE 1 TABLET EVERY MORNING & 2 TABLETS AT NIGHT, Disp: 270 tablet, Rfl: 1   metoprolol  succinate (TOPROL -XL) 50 MG 24 hr tablet, Take 1 tablet (50 mg total) by mouth daily. Take with or immediately following a meal., Disp: 90 tablet, Rfl: 3   Multiple Vitamin (MULTIVITAMIN) tablet, Take 1 tablet by mouth daily., Disp: , Rfl:    pantoprazole  (PROTONIX ) 40 MG tablet, Take 1 tablet (40 mg total) by mouth daily., Disp: 90 tablet, Rfl: 1  No Known Allergies  OBJECTIVE: There were no vitals taken for this visit. Physical Exam Vitals and nursing note reviewed.  Constitutional:      General: She is not in acute distress.    Appearance: Normal appearance. She is not ill-appearing, toxic-appearing or diaphoretic.  HENT:     Head: Normocephalic and atraumatic.  Eyes:     General: No scleral icterus.       Right eye: No discharge.        Left eye: No discharge.     Extraocular Movements: Extraocular movements intact.     Conjunctiva/sclera: Conjunctivae normal.     Pupils: Pupils are equal, round, and reactive to light.  Cardiovascular:     Rate and Rhythm: Normal rate and regular rhythm.  Pulmonary:     Effort: Pulmonary effort is normal. No respiratory distress.     Breath sounds: Normal breath sounds. No wheezing, rhonchi or rales.  Musculoskeletal:     Right lower leg: No edema.     Left lower leg: No edema.  Skin:    General: Skin is warm.     Findings: No rash.  Neurological:     Mental Status: She is alert and oriented to person, place, and time. Mental status is at baseline.     Motor: No weakness.     Gait: Gait normal.  Psychiatric:        Mood and Affect: Mood  normal.        Behavior: Behavior normal.        Thought Content: Thought content normal.        Judgment: Judgment normal.    Diabetic Foot Exam - Simple   No data filed     No results found for this or any previous visit (from the past 24 hours).   ASSESSMENT AND PLAN: Lulla Linville is a 89 y.o. female present for  Controlled type 2 diabetes mellitus (HCC)/Type 2 DM mild nonproliferative retinopathy, macular edema, uncontrol (HCC) Continue metformin  1500 mg total a day- may divide. PNA series: Completed 2019 Flu shot:*** Urine microal: UTD 11/09/2022*** Foot exam: completed 04/26/2023*** Eye exam: Dr. Octavia, routine eye exams with macular degeneration.UTD 11/2023 Dr. Elner A1c: 6.5>>7.3 >>6.3>>6.4> 6.8> 6.5 > 6.4 >6.8 > 6.4>7.0>6.3 >***A1c  collected today  Hyperlipidemia associated (HCC)/Peripheral arterial disease (HCC)/Palpitations/IDA/CKD 3 - Continue to  avoid caffeine, stimulants-her vice is excess chocolate. - Continue metoprolol  50 mg QD> prescribed by cardio - Continue atorvastatin  40 mg daily> prescribed by cardio - Continue baby aspirin  daily. - avoid NSAIDS; renal dose.   Anxiety Stable Continue lexapro  5 mg QD-tolerating She discontinued  Trazodone  25-50 mg nightly (felt made her shaky)  Gastroesophageal reflux disease without esophagitis Stable Continue Protonix  -Encouraged her to try Biotene 2 times a day.    Fecal smearing/hemorrhoids: Evaluated by Dr. Luis  Chronic thoracic back pain and new left jaw discomfort: Stable Continue gabapentin  nightly  Continue Zanaflex  prn  Influenza vaccine***  No follow-ups on file.    No orders of the defined types were placed in this encounter.   No orders of the defined types were placed in this encounter.     Charlies Bellini, DO 03/25/2024

## 2024-03-25 NOTE — Patient Instructions (Incomplete)
 Return in about 24 weeks (around 09/10/2024) for cpe (20 min), Routine chronic condition follow-up (40 min).        Great to see you today.  I have refilled the medication(s) we provide.   If labs were collected or images ordered, we will inform you of  results once we have received them and reviewed. We will contact you either by echart message, or telephone call.  Please give ample time to the testing facility, and our office to run,  receive and review results. Please do not call inquiring of results, even if you can see them in your chart. We will contact you as soon as we are able. If it has been over 1 week since the test was completed, and you have not yet heard from us , then please call us .    - echart message- for normal results that have been seen by the patient already.   - telephone call: abnormal results or if patient has not viewed results in their echart.  If a referral to a specialist was entered for you, please call us  in 2 weeks if you have not heard from the specialist office to schedule.

## 2024-03-26 ENCOUNTER — Encounter: Payer: Self-pay | Admitting: Family Medicine

## 2024-03-26 ENCOUNTER — Ambulatory Visit: Admitting: Family Medicine

## 2024-03-26 VITALS — BP 110/74 | HR 77 | Temp 98.2°F | Wt 117.0 lb

## 2024-03-26 DIAGNOSIS — I739 Peripheral vascular disease, unspecified: Secondary | ICD-10-CM | POA: Diagnosis not present

## 2024-03-26 DIAGNOSIS — Z7984 Long term (current) use of oral hypoglycemic drugs: Secondary | ICD-10-CM | POA: Diagnosis not present

## 2024-03-26 DIAGNOSIS — I4891 Unspecified atrial fibrillation: Secondary | ICD-10-CM

## 2024-03-26 DIAGNOSIS — K219 Gastro-esophageal reflux disease without esophagitis: Secondary | ICD-10-CM

## 2024-03-26 DIAGNOSIS — F419 Anxiety disorder, unspecified: Secondary | ICD-10-CM

## 2024-03-26 DIAGNOSIS — E785 Hyperlipidemia, unspecified: Secondary | ICD-10-CM | POA: Diagnosis not present

## 2024-03-26 DIAGNOSIS — E1169 Type 2 diabetes mellitus with other specified complication: Secondary | ICD-10-CM

## 2024-03-26 DIAGNOSIS — G8929 Other chronic pain: Secondary | ICD-10-CM

## 2024-03-26 DIAGNOSIS — M546 Pain in thoracic spine: Secondary | ICD-10-CM

## 2024-03-26 DIAGNOSIS — Z23 Encounter for immunization: Secondary | ICD-10-CM

## 2024-03-26 DIAGNOSIS — I10 Essential (primary) hypertension: Secondary | ICD-10-CM | POA: Diagnosis not present

## 2024-03-26 LAB — MICROALBUMIN / CREATININE URINE RATIO
Creatinine,U: 116 mg/dL
Microalb Creat Ratio: 22.8 mg/g (ref 0.0–30.0)
Microalb, Ur: 2.6 mg/dL — ABNORMAL HIGH (ref 0.0–1.9)

## 2024-03-26 LAB — POCT GLYCOSYLATED HEMOGLOBIN (HGB A1C)
HbA1c POC (<> result, manual entry): 6.3 % (ref 4.0–5.6)
HbA1c, POC (controlled diabetic range): 6.3 % (ref 0.0–7.0)
HbA1c, POC (prediabetic range): 6.3 % (ref 5.7–6.4)
Hemoglobin A1C: 6.3 % — AB (ref 4.0–5.6)

## 2024-03-26 MED ORDER — ESCITALOPRAM OXALATE 5 MG PO TABS: 5.0000 mg | ORAL_TABLET | Freq: Every day | ORAL | 1 refills | Status: AC

## 2024-03-26 MED ORDER — PANTOPRAZOLE SODIUM 40 MG PO TBEC: 40.0000 mg | DELAYED_RELEASE_TABLET | Freq: Every day | ORAL | 1 refills | Status: AC

## 2024-03-26 MED ORDER — GABAPENTIN 300 MG PO CAPS: 600.0000 mg | ORAL_CAPSULE | Freq: Every day | ORAL | 1 refills | Status: AC

## 2024-03-26 MED ORDER — METFORMIN HCL 500 MG PO TABS: ORAL_TABLET | ORAL | 1 refills | Status: AC

## 2024-03-26 MED ORDER — GABAPENTIN 300 MG PO CAPS: 300.0000 mg | ORAL_CAPSULE | Freq: Every day | ORAL | 1 refills | Status: DC

## 2024-03-26 MED ORDER — ATORVASTATIN CALCIUM 40 MG PO TABS
40.0000 mg | ORAL_TABLET | Freq: Every day | ORAL | 1 refills | Status: AC
Start: 2024-03-26 — End: ?

## 2024-03-27 ENCOUNTER — Ambulatory Visit: Payer: Self-pay | Admitting: Family Medicine

## 2024-05-28 ENCOUNTER — Telehealth: Payer: Self-pay | Admitting: Oncology

## 2024-06-04 ENCOUNTER — Inpatient Hospital Stay: Attending: Oncology

## 2024-06-04 ENCOUNTER — Inpatient Hospital Stay: Admitting: Oncology

## 2024-06-04 VITALS — BP 113/65 | HR 77 | Temp 96.2°F | Resp 16 | Wt 115.2 lb

## 2024-06-04 DIAGNOSIS — Z79899 Other long term (current) drug therapy: Secondary | ICD-10-CM | POA: Insufficient documentation

## 2024-06-04 DIAGNOSIS — Z86711 Personal history of pulmonary embolism: Secondary | ICD-10-CM

## 2024-06-04 DIAGNOSIS — I480 Paroxysmal atrial fibrillation: Secondary | ICD-10-CM | POA: Diagnosis not present

## 2024-06-04 DIAGNOSIS — Z87891 Personal history of nicotine dependence: Secondary | ICD-10-CM | POA: Diagnosis not present

## 2024-06-04 DIAGNOSIS — Z7901 Long term (current) use of anticoagulants: Secondary | ICD-10-CM | POA: Insufficient documentation

## 2024-06-04 LAB — CBC WITH DIFFERENTIAL (CANCER CENTER ONLY)
Abs Immature Granulocytes: 0.01 K/uL (ref 0.00–0.07)
Basophils Absolute: 0 K/uL (ref 0.0–0.1)
Basophils Relative: 1 %
Eosinophils Absolute: 0.2 K/uL (ref 0.0–0.5)
Eosinophils Relative: 4 %
HCT: 39.8 % (ref 36.0–46.0)
Hemoglobin: 12.9 g/dL (ref 12.0–15.0)
Immature Granulocytes: 0 %
Lymphocytes Relative: 29 %
Lymphs Abs: 1.7 K/uL (ref 0.7–4.0)
MCH: 31.2 pg (ref 26.0–34.0)
MCHC: 32.4 g/dL (ref 30.0–36.0)
MCV: 96.1 fL (ref 80.0–100.0)
Monocytes Absolute: 0.5 K/uL (ref 0.1–1.0)
Monocytes Relative: 7 %
Neutro Abs: 3.6 K/uL (ref 1.7–7.7)
Neutrophils Relative %: 59 %
Platelet Count: 305 K/uL (ref 150–400)
RBC: 4.14 MIL/uL (ref 3.87–5.11)
RDW: 12.9 % (ref 11.5–15.5)
WBC Count: 6.1 K/uL (ref 4.0–10.5)
nRBC: 0 % (ref 0.0–0.2)

## 2024-06-04 LAB — D-DIMER, QUANTITATIVE: D-Dimer, Quant: 0.93 ug{FEU}/mL — ABNORMAL HIGH (ref 0.00–0.50)

## 2024-06-04 NOTE — Progress Notes (Signed)
 "  [facility]  HEMATOLOGY CLINIC PROGRESS NOTE  PATIENT NAME: Melissa [name]   MR#: [id number] DOB: 1935/02/13  Patient Care Team: Catherine Charlies LABOR, DO as PCP - General (Family Medicine) Burnard Debby LABOR, MD (Inactive) as PCP - Cardiology (Cardiology) Elner Arley LABOR, MD as Consulting Physician (Ophthalmology) Starla Harland BROCKS, MD as Consulting Physician (Obstetrics and Gynecology) Cathlyn BROCKS Cary, Bobie BRAVO, MD as Consulting Physician (Obstetrics and Gynecology) [place], [facility]   Date of visit: 06/04/2024   ASSESSMENT & PLAN:   Melissa [name] is a 89 y.o. lady with a past medical history of type 2 diabetes mellitus, dyslipidemia, paroxysmal atrial fibrillation, varicose veins, history of shoulder fracture, was referred to our service for management decisions regarding anticoagulation, given her history of pulmonary embolism diagnosed in September 2024.  Grossly negative hypercoagulable workup.  Hx pulmonary embolism - Bilateral pulmonary emboli diagnosed in September 2024. No recent surgeries, long trips, or immobilization. No current symptoms of chest pain or shortness of breath. On Eliquis  5 mg twice daily without any reported side effects or bleeding.  -Given bilateral pulmonary emboli, advised anticoagulation for minimum of 6 months.  -Based on history, pulmonary emboli seems to be unprovoked.   Hence we obtained thrombophilia workup on her initial consultation with us  on 05/09/2023.  Prothrombin gene mutation, factor V Leiden mutation, beta-2  glycoprotein antibodies, anticardiolipin antibodies were all negative.  D-dimer was 0.32.  Rest of the hypercoagulable workup was deferred given the fact that she had recent thromboembolism and current Eliquis  use, which can result in falsely abnormal labs.  Following completion of 6 months of anticoagulation, Eliquis  was dose reduced to prophylactic dosing of 2.5 mg p.o. twice daily starting from March  2025.  We completed remaining thrombophilia workup in March 2025.  Protein C activity, protein S activity, Antithrombin III  activity were all normal.  No evidence of lupus anticoagulant.  There was a misunderstanding and patient discontinued anticoagulation.  On her follow-up visit with us  on 03/05/2024, D-dimer was increased at 1.22.  Given chronic varicose veins with increased risk of clotting, patient was agreeable to going back on Eliquis  2.5 mg twice daily.  Resumed this from 03/05/2024.  Patient opted to remain off of anticoagulation and maintained on daily aspirin . D-dimer has improved since the acute event but remains mildly elevated. She is currently asymptomatic for recurrence. Due to advanced age and elevated bleeding risk, anticoagulation has been withheld, with a plan for close monitoring unless recurrence occurs. - Reviewed D-dimer results and trend with her. - Advised continuation of daily aspirin . - Discussed that if D-dimer increases or symptoms recur, imaging will be ordered to evaluate for recurrent thromboembolism. - Discussed that recurrent pulmonary embolism would necessitate lifelong anticoagulation. - Scheduled follow-up in four months for close monitoring given she is not on anticoagulation.  Paroxysmal atrial fibrillation She has paroxysmal atrial fibrillation managed with metoprolol  for rate control. She experiences intermittent dyspnea but no acute symptoms. She remains under cardiology care and is not currently on anticoagulation for stroke prevention, given her bleeding risk and shared decision making in the context of her prior pulmonary embolism. - Reviewed current management with metoprolol  for rate control. - Assessed for symptoms and confirmed no acute issues requiring intervention.  I spent a total of 22 minutes during this encounter with the patient including review of chart and various tests results, discussions about plan of care and coordination of care  plan.  I reviewed lab results and outside records  for this visit and discussed relevant results with the patient. Diagnosis, plan of care and treatment options were also discussed in detail with the patient. Opportunity provided to ask questions and answers provided to her apparent satisfaction. Provided instructions to call our clinic with any problems, questions or concerns prior to return visit. I recommended to continue follow-up with PCP and sub-specialists. She verbalized understanding and agreed with the plan. No barriers to learning was detected.  Chinita Patten, MD  06/04/2024 10:21 AM  [facility] - A DEPT OF [facility] [address] [place] [address] Dept: [phone] Dept Fax: [phone]   CHIEF COMPLAINT/ REASON FOR VISIT:  History of pulmonary embolism diagnosed in September 2024.  Unprovoked.  Grossly negative hypercoagulable workup.  INTERVAL HISTORY:  Discussed the use of AI scribe software for clinical note transcription with the patient, who gave verbal consent to proceed.  History of Present Illness  Melissa [name] is an 89 year old female with venous thromboembolism and paroxysmal atrial fibrillation who presents for follow-up to monitor for recurrence of thromboembolic disease and assess anticoagulation needs.  She reports no new symptoms of dyspnea or chest pain over the past three months. She discontinued Eliquis , previously taken at half tablet twice daily, and currently takes daily aspirin . She inquired about indications for resuming anticoagulation and discussed her D-dimer trends, which were 6.24 in September 2024, decreased to 0.38-0.32 while on Eliquis , and measured 1.29 in September 2025. She has not experienced falls or bleeding events.  She describes longstanding varicose veins in her right leg, present for over fifty years, with a sensation of hanging in her knee but no  significant discomfort or desire for intervention. She experiences occasional indigestion attributed to a large hernia, which she manages with dietary adjustments, and denies significant gastrointestinal changes.  She reports paroxysmal atrial fibrillation and intermittent shortness of breath. She reports that she does not have high blood pressure. She maintains regular follow-up with cardiology, last seen in August 2025.  She has experienced unintentional weight loss of approximately three pounds since August 2025, without changes in appetite or dietary habits. She lives independently and has family residing out of state.   SUMMARY OF HEMATOLOGIC HISTORY:  Patient was admitted to the hospital on 02/25/2023 after she presented with complaints of elevated heart rate, fatigue, dyspnea on exertion.  In the ED, D-dimer was elevated at 6.24.  CTA chest demonstrated a proximal segmental RML and small subsegmental LUL pulmonary embolus without evidence of right heart strain in addition to a small peripheral consolidation concerning for atelectasis versus infarct.  She was admitted for further management.  Initially she was on heparin  drip and later transitioned to Eliquis .   Based on history, pulmonary embolism seems to be unprovoked.  Hence she was referred to us  for further evaluation and also recommendations regarding anticoagulation.   She has been compliant with Eliquis  5 mg twice daily and reports no problems with the medication and denies any bleeding or bruising. She lives alone and has a history of falls, including one prior to her hospital admission. She also has a large varicose vein in her leg that has not ruptured. She reports occasional dizziness upon waking up in the morning but waits for it to pass before getting up. She also mentions occasional wheezing when she exerts herself but has an inhaler for it. She used to smoke in her twenties but quit a long time ago.   Bilateral pulmonary  emboli  diagnosed in September 2024. No recent surgeries, long trips, or immobilization. No current symptoms of chest pain or shortness of breath. On Eliquis  5 mg twice daily without any reported side effects or bleeding.   Given bilateral pulmonary emboli, advised anticoagulation for minimum of 6 months.   Based on history, pulmonary emboli seems to be unprovoked.  Hence we obtained thrombophilia workup on her initial consultation with us  on 05/09/2023.  Prothrombin gene mutation, factor V Leiden mutation, beta-2  glycoprotein antibodies, anticardiolipin antibodies were all negative.  D-dimer was 0.32.  Rest of the hypercoagulable workup was deferred given the fact that she had recent thromboembolism and current Eliquis  use, which can result in falsely abnormal labs.   Following completion of 6 months of anticoagulation, Eliquis  was dose reduced to prophylactic dosing of 2.5 mg p.o. twice daily starting from March 2025.  We completed remaining thrombophilia workup in March 2025.  Protein C activity, protein S activity, Antithrombin III  activity were all normal.  No evidence of lupus anticoagulant.  There was a misunderstanding and patient discontinued anticoagulation.  On her follow-up visit with us  on 03/05/2024, D-dimer was increased at 1.22.  Given chronic varicose veins with increased risk of clotting, patient was agreeable to going back on Eliquis  2.5 mg twice daily.  Resumed this from 03/05/2024.   I have reviewed the past medical history, past surgical history, social history and family history with the patient and they are unchanged from previous note.  ALLERGIES: She has no known allergies.  MEDICATIONS:  Current Outpatient Medications  Medication Sig Dispense Refill   albuterol  (VENTOLIN  HFA) 108 (90 Base) MCG/ACT inhaler Inhale 2 puffs into the lungs every 6 (six) hours as needed for wheezing or shortness of breath. 8 g 0   atorvastatin  (LIPITOR) 40 MG tablet Take 1 tablet (40 mg total) by mouth  daily. 90 tablet 1   Calcium  Carbonate-Vitamin D  (OYSTER SHELL CALCIUM /D) 250-125 MG-UNIT TABS Take by mouth.     dicyclomine  (BENTYL ) 10 MG capsule 1 tab daily prn , take about 30 minutes before meal. 30 capsule 5   escitalopram  (LEXAPRO ) 5 MG tablet Take 1 tablet (5 mg total) by mouth daily. 90 tablet 1   gabapentin  (NEURONTIN ) 300 MG capsule Take 2 capsules (600 mg total) by mouth at bedtime. 180 capsule 1   metFORMIN  (GLUCOPHAGE ) 500 MG tablet TAKE 1 TABLET EVERY MORNING & 2 TABLETS AT NIGHT 270 tablet 1   metoprolol  succinate (TOPROL -XL) 50 MG 24 hr tablet Take 1 tablet (50 mg total) by mouth daily. Take with or immediately following a meal. 90 tablet 3   Multiple Vitamin (MULTIVITAMIN) tablet Take 1 tablet by mouth daily.     pantoprazole  (PROTONIX ) 40 MG tablet Take 1 tablet (40 mg total) by mouth daily. 90 tablet 1   apixaban  (ELIQUIS ) 5 MG TABS tablet Take half tablet (2.5 mg) twice daily (Patient not taking: Reported on 06/04/2024) 60 tablet 3   No current facility-administered medications for this visit.     REVIEW OF SYSTEMS:    Review of Systems - Oncology  All other pertinent systems were reviewed with the patient and are negative.  PHYSICAL EXAMINATION:    Onc Performance Status - 06/04/24 1007       ECOG Perf Status   ECOG Perf Status Restricted in physically strenuous activity but ambulatory and able to carry out work of a light or sedentary nature, e.g., light house work, office work      KPS SCALE  KPS % SCORE Able to carry on normal activity, minor s/s of disease          Vitals:   06/04/24 1002  BP: 113/65  Pulse: 77  Resp: 16  Temp: (!) 96.2 F (35.7 C)  SpO2: 97%   Filed Weights   06/04/24 1002  Weight: 115 lb 3.2 oz (52.3 kg)    Physical Exam Constitutional:      General: She is not in acute distress.    Appearance: Normal appearance.  HENT:     Head: Normocephalic and atraumatic.  Eyes:     General: No scleral icterus.     Conjunctiva/sclera: Conjunctivae normal.  Cardiovascular:     Rate and Rhythm: Normal rate and regular rhythm.     Heart sounds: Normal heart sounds.  Pulmonary:     Effort: Pulmonary effort is normal.     Breath sounds: Normal breath sounds.  Abdominal:     General: There is no distension.  Musculoskeletal:     Right lower leg: No edema.     Left lower leg: No edema.     Comments: Varicose veins on RLE  Neurological:     General: No focal deficit present.     Mental Status: She is alert and oriented to person, place, and time.  Psychiatric:        Mood and Affect: Mood normal.        Behavior: Behavior normal.        Thought Content: Thought content normal.     LABORATORY DATA:   I have reviewed the data as listed.  Results for orders placed or performed in visit on 06/04/24  D-dimer, quantitative  Result Value Ref Range   D-Dimer, Quant 0.93 (H) 0.00 - 0.50 ug/mL-FEU  CBC with Differential (Cancer Center Only)  Result Value Ref Range   WBC Count 6.1 4.0 - 10.5 K/uL   RBC 4.14 3.87 - 5.11 MIL/uL   Hemoglobin 12.9 12.0 - 15.0 g/dL   HCT 60.1 63.9 - 53.9 %   MCV 96.1 80.0 - 100.0 fL   MCH 31.2 26.0 - 34.0 pg   MCHC 32.4 30.0 - 36.0 g/dL   RDW 87.0 88.4 - 84.4 %   Platelet Count 305 150 - 400 K/uL   nRBC 0.0 0.0 - 0.2 %   Neutrophils Relative % 59 %   Neutro Abs 3.6 1.7 - 7.7 K/uL   Lymphocytes Relative 29 %   Lymphs Abs 1.7 0.7 - 4.0 K/uL   Monocytes Relative 7 %   Monocytes Absolute 0.5 0.1 - 1.0 K/uL   Eosinophils Relative 4 %   Eosinophils Absolute 0.2 0.0 - 0.5 K/uL   Basophils Relative 1 %   Basophils Absolute 0.0 0.0 - 0.1 K/uL   Immature Granulocytes 0 %   Abs Immature Granulocytes 0.01 0.00 - 0.07 K/uL    RADIOGRAPHIC STUDIES:  No recent pertinent imaging studies available to review.  Orders Placed This Encounter  Procedures   CBC with Differential (Cancer Center Only)    Standing Status:   Future    Expiration Date:   06/04/2025   D-dimer,  quantitative    Standing Status:   Future    Expiration Date:   06/04/2025     Future Appointments  Date Time Provider Department Center  09/10/2024 10:20 AM Catherine Charlies LABOR, DO [facility] [address]  10/01/2024  9:30 AM [facility] PHLEBOTOMIST [facility] None  10/01/2024  9:45 AM Rekita Miotke, Chinita, MD [facility] None  11/06/2024  1:50 PM [facility] ANNUAL WELLNESS VISIT [facility] [address]     This document was completed utilizing speech recognition software. Grammatical errors, random word insertions, pronoun errors, and incomplete sentences are an occasional consequence of this system due to software limitations, ambient noise, and hardware issues. Any formal questions or concerns about the content, text or information contained within the body of this dictation should be directly addressed to the provider for clarification.  "

## 2024-06-08 ENCOUNTER — Emergency Department (HOSPITAL_COMMUNITY)
Admission: EM | Admit: 2024-06-08 | Discharge: 2024-06-08 | Disposition: A | Discharge status: Home / Self Care | Attending: Emergency Medicine | Admitting: Emergency Medicine

## 2024-06-08 ENCOUNTER — Emergency Department (HOSPITAL_COMMUNITY)

## 2024-06-08 ENCOUNTER — Encounter (HOSPITAL_COMMUNITY): Payer: Self-pay

## 2024-06-08 ENCOUNTER — Other Ambulatory Visit: Payer: Self-pay

## 2024-06-08 DIAGNOSIS — M79631 Pain in right forearm: Secondary | ICD-10-CM | POA: Insufficient documentation

## 2024-06-08 DIAGNOSIS — S42201A Unspecified fracture of upper end of right humerus, initial encounter for closed fracture: Secondary | ICD-10-CM | POA: Insufficient documentation

## 2024-06-08 DIAGNOSIS — E119 Type 2 diabetes mellitus without complications: Secondary | ICD-10-CM | POA: Diagnosis not present

## 2024-06-08 DIAGNOSIS — Y93K1 Activity, walking an animal: Secondary | ICD-10-CM | POA: Diagnosis not present

## 2024-06-08 DIAGNOSIS — Z7984 Long term (current) use of oral hypoglycemic drugs: Secondary | ICD-10-CM | POA: Insufficient documentation

## 2024-06-08 DIAGNOSIS — Z7901 Long term (current) use of anticoagulants: Secondary | ICD-10-CM | POA: Diagnosis not present

## 2024-06-08 DIAGNOSIS — I4891 Unspecified atrial fibrillation: Secondary | ICD-10-CM | POA: Insufficient documentation

## 2024-06-08 DIAGNOSIS — W010XXA Fall on same level from slipping, tripping and stumbling without subsequent striking against object, initial encounter: Secondary | ICD-10-CM | POA: Insufficient documentation

## 2024-06-08 DIAGNOSIS — M25511 Pain in right shoulder: Secondary | ICD-10-CM | POA: Diagnosis present

## 2024-06-08 MED ORDER — ACETAMINOPHEN 325 MG PO TABS
650.0000 mg | ORAL_TABLET | Freq: Four times a day (QID) | ORAL | Status: AC | PRN
  Administered 2024-06-08: 650 mg via ORAL
  Filled 2024-06-08: qty 2

## 2024-06-08 MED ORDER — APIXABAN 2.5 MG PO TABS: 2.5000 mg | ORAL_TABLET | Freq: Two times a day (BID) | ORAL | 0 refills | Status: AC

## 2024-06-08 MED ORDER — OXYCODONE HCL 5 MG PO TABS: 5.0000 mg | ORAL_TABLET | Freq: Four times a day (QID) | ORAL | 0 refills | Status: DC | PRN

## 2024-06-08 MED ORDER — OXYCODONE-ACETAMINOPHEN 5-325 MG PO TABS
1.0000 | ORAL_TABLET | Freq: Once | ORAL | Status: AC
  Administered 2024-06-08: 1 via ORAL
  Filled 2024-06-08: qty 1

## 2024-06-08 NOTE — ED Provider Notes (Signed)
 " [place] EMERGENCY DEPARTMENT AT [facility] Provider Note   CSN: [id number] Arrival date & time: 06/08/24  9168     Patient presents with: Melissa [name] is a 89 y.o. female.    Fall     Patient has history of diabetes acid reflux hyperlipidemia.  Patient states she was walking her dog this morning when she tripped and fell landing on her right side.  Patient states she is having pain now in her right shoulder elbow and forearm.  Patient states the pain is mostly in the shoulder.  It hurts to move.  She denies hitting her head.  She did not lose consciousness.  She is not having any headache or neck pain.  She does not have any chest pain or abdominal pain.  No lower extremity pain  Prior to Admission medications  Medication Sig Start Date End Date Taking? Authorizing Provider  apixaban  (ELIQUIS ) 2.5 MG TABS tablet Take 1 tablet (2.5 mg total) by mouth 2 (two) times daily. 06/08/24 06/22/24 Yes Randol Simmonds, MD  oxyCODONE  (ROXICODONE ) 5 MG immediate release tablet Take 1 tablet (5 mg total) by mouth every 6 (six) hours as needed for severe pain (pain score 7-10). 06/08/24  Yes Randol Simmonds, MD  albuterol  (VENTOLIN  HFA) [phone] Base) MCG/ACT inhaler Inhale 2 puffs into the lungs every 6 (six) hours as needed for wheezing or shortness of breath. 08/19/22   Kuneff, Renee A, DO  atorvastatin  (LIPITOR) 40 MG tablet Take 1 tablet (40 mg total) by mouth daily. 03/26/24   Kuneff, Renee A, DO  Calcium  Carbonate-Vitamin D  (OYSTER SHELL CALCIUM /D) 250-125 MG-UNIT TABS Take by mouth.    [provider]  dicyclomine  (BENTYL ) 10 MG capsule 1 tab daily prn , take about 30 minutes before meal. 10/10/23   Kuneff, Renee A, DO  escitalopram  (LEXAPRO ) 5 MG tablet Take 1 tablet (5 mg total) by mouth daily. 03/26/24   Kuneff, Renee A, DO  gabapentin  (NEURONTIN ) 300 MG capsule Take 2 capsules (600 mg total) by mouth at bedtime. 03/26/24   Kuneff, Renee A, DO  metFORMIN  (GLUCOPHAGE ) 500  MG tablet TAKE 1 TABLET EVERY MORNING & 2 TABLETS AT NIGHT 03/26/24   Kuneff, Renee A, DO  metoprolol  succinate (TOPROL -XL) 50 MG 24 hr tablet Take 1 tablet (50 mg total) by mouth daily. Take with or immediately following a meal. 07/17/23 06/04/24  Burnard Debby LABOR, MD  Multiple Vitamin (MULTIVITAMIN) tablet Take 1 tablet by mouth daily.    [provider]  pantoprazole  (PROTONIX ) 40 MG tablet Take 1 tablet (40 mg total) by mouth daily. 03/26/24   Kuneff, Renee A, DO    Allergies: Patient has no known allergies.    Review of Systems  Updated Vital Signs BP 101/73   Pulse 74   Temp 97.9 F (36.6 C) (Oral)   Resp 17   Ht 1.549 m (5' 1)   Wt 52.2 kg   SpO2 97%   BMI 21.73 kg/m   Physical Exam Vitals and nursing note reviewed.  Constitutional:      General: She is not in acute distress.    Appearance: She is well-developed. She is not diaphoretic.  HENT:     Head: Normocephalic and atraumatic.     Right Ear: External ear normal.     Left Ear: External ear normal.  Eyes:     General: No scleral icterus.       Right eye: No discharge.  Left eye: No discharge.     Conjunctiva/sclera: Conjunctivae normal.  Neck:     Trachea: No tracheal deviation.  Cardiovascular:     Rate and Rhythm: Normal rate.  Pulmonary:     Effort: Pulmonary effort is normal. No respiratory distress.     Breath sounds: No stridor.  Abdominal:     General: There is no distension.  Musculoskeletal:        General: Tenderness present. No swelling or deformity.     Right shoulder: Tenderness present. Decreased range of motion.     Right elbow: Tenderness present.     Right forearm: Tenderness present. No swelling or deformity.     Cervical back: Neck supple.  Skin:    General: Skin is warm and dry.     Findings: No rash.  Neurological:     Mental Status: She is alert. Mental status is at baseline.     Cranial Nerves: No dysarthria or facial asymmetry.     Motor: No seizure activity.      (all labs ordered are listed, but only abnormal results are displayed) Labs Reviewed - No data to display  EKG: None  Radiology: DG Shoulder Right Result Date: 06/08/2024 EXAM: 1 VIEW(S) XRAY OF THE RIGHT SHOULDER 06/08/2024 09:33:00 AM COMPARISON: None available. CLINICAL HISTORY: fall fall FINDINGS: BONES AND JOINTS: There is a comminuted fracture of the right humeral head and neck. The bony glenoid is intact. There are mild degenerative changes of the acromioclavicular joint. The scapula and clavicle appeared to be intact. SOFT TISSUES: No abnormal calcifications. Visualized lung is unremarkable. IMPRESSION: 1. Comminuted fracture of the right humeral head and neck. 2. Mild degenerative changes of the acromioclavicular joint. Electronically signed by: Evalene Coho MD 06/08/2024 09:41 AM EST RP Workstation: [id number]   DG Forearm Right Result Date: 06/08/2024 EXAM: VIEW(S) XRAY OF THE FOREARM 06/08/2024 09:33:00 AM COMPARISON: None available. CLINICAL HISTORY: fall FINDINGS: FINDINGS: BONES AND JOINTS: The bones are osteopenic. No acute fracture. No malalignment. Degenerative changes of the wrist with chondrocalcinosis. Widening of the scapholunate interval. SOFT TISSUES: The soft tissues are unremarkable. IMPRESSION: 1. No acute fracture or dislocation. 2. Widening of the scapholunate interval incidentally noted, which may reflect underlying SLAC wrist. Electronically signed by: Waddell Calk MD 06/08/2024 09:40 AM EST RP Workstation: [id number]   DG Humerus Right Result Date: 06/08/2024 EXAM: 1 VIEW(S) XRAY OF THE RIGHT HUMERUS 06/08/2024 09:33:00 AM COMPARISON: Right shoulder series dated 06/08/2024. CLINICAL HISTORY: fall FINDINGS: BONES AND JOINTS: There is a comminuted fracture of the humeral head and neck. The dominant fracture fragments are within the glenoid fossa. There is mild ventral displacement of the proximal humeral shaft. SOFT TISSUES: The soft tissues are  unremarkable. IMPRESSION: 1. Comminuted fracture of the right humeral head and neck with dominant fracture fragments within the glenoid fossa and mild ventral displacement of the proximal humeral shaft. Electronically signed by: Evalene Coho MD 06/08/2024 09:40 AM EST RP Workstation: [id number]     Procedures   Medications Ordered in the ED  acetaminophen  (TYLENOL ) tablet 650 mg (650 mg Oral Given 06/08/24 0844)  oxyCODONE -acetaminophen  (PERCOCET/ROXICET) 5-325 MG per tablet 1 tablet (1 tablet Oral Given 06/08/24 1018)                                    Medical Decision Making Problems Addressed: Closed fracture of proximal end of right humerus, unspecified fracture morphology, initial encounter: acute illness  or injury that poses a threat to life or bodily functions  Amount and/or Complexity of Data Reviewed Radiology: ordered and independent interpretation performed.  Risk OTC drugs. Prescription drug management.   Patient presented to ED for evaluation of a mechanical fall.  Patient denies any head injury.  Patient primarily having pain in her right shoulder.  Patient's x-rays do show a proximal humerus fracture.  No evidence of elbow or wrist fracture.  Patient was placed in a sling.  Pain medications were provided.  Patient was concerned about possible DVT.  Patient states she had DVT following a humerus fracture previously.  She also previously had been on Eliquis  for atrial fibrillation although she is currently not on that.  Had a long discussion of risks versus benefits regarding Eliquis .  Explained to patient and her niece that typically patients do not require DVT prophylaxis after this type of injury.  Did discuss that she could potentially have worsening swelling and bruising associated with the Eliquis .  Patient understands and would like to proceed considering her prior history.  I will give a 2-week prescription for DVT prophylaxis dosing of Eliquis  in addition  to oxycodone  for pain.  Outpatient follow-up with orthopedics.  Patient previously saw Dr. Myra     Final diagnoses:  Closed fracture of proximal end of right humerus, unspecified fracture morphology, initial encounter    ED Discharge Orders          Ordered    oxyCODONE  (ROXICODONE ) 5 MG immediate release tablet  Every 6 hours PRN        06/08/24 1017    apixaban  (ELIQUIS ) 2.5 MG TABS tablet  2 times daily        06/08/24 1019               Randol Simmonds, MD 06/08/24 1022  "

## 2024-06-08 NOTE — ED Triage Notes (Addendum)
 Pt was walking her dog when she tripped and fell into the grass, landing on her right arm. Pt c.o pain from her right shoulder down to her forearm. Denies head injury.

## 2024-06-08 NOTE — ED Notes (Signed)
 Pt given shoulder immobilizer in triage

## 2024-06-08 NOTE — Discharge Instructions (Addendum)
 Take the pain medications as needed.  As we discussed, I also wrote a prescription for Eliquis  for DVT prevention.  Follow-up with the orthopedic doctor as we discussed.  Call the office on Monday to schedule an appointment.  I reviewed the records and previously you saw Dr. Josefina.

## 2024-06-10 ENCOUNTER — Ambulatory Visit: Admitting: Family Medicine

## 2024-06-10 ENCOUNTER — Encounter: Payer: Self-pay | Admitting: Family Medicine

## 2024-06-10 VITALS — BP 122/70 | HR 70 | Temp 98.2°F | Wt 115.8 lb

## 2024-06-10 DIAGNOSIS — S42201D Unspecified fracture of upper end of right humerus, subsequent encounter for fracture with routine healing: Secondary | ICD-10-CM | POA: Diagnosis not present

## 2024-06-10 DIAGNOSIS — R110 Nausea: Secondary | ICD-10-CM

## 2024-06-10 MED ORDER — ONDANSETRON 4 MG PO TBDP: 4.0000 mg | ORAL_TABLET | Freq: Three times a day (TID) | ORAL | 0 refills | Status: DC | PRN

## 2024-06-10 MED ORDER — OXYCODONE-ACETAMINOPHEN 5-325 MG PO TABS: 1.0000 | ORAL_TABLET | Freq: Four times a day (QID) | ORAL | 0 refills | Status: AC | PRN

## 2024-06-10 NOTE — Patient Instructions (Signed)
 Return if symptoms worsen or fail to improve.  Follow up with orthopedics.       Great to see you today.  I have refilled the medication(s) we provide.   If labs were collected or images ordered, we will inform you of  results once we have received them and reviewed. We will contact you either by echart message, or telephone call.  Please give ample time to the testing facility, and our office to run,  receive and review results. Please do not call inquiring of results, even if you can see them in your chart. We will contact you as soon as we are able. If it has been over 1 week since the test was completed, and you have not yet heard from us , then please call us .    - echart message- for normal results that have been seen by the patient already.   - telephone call: abnormal results or if patient has not viewed results in their echart.  If a referral to a specialist was entered for you, please call us  in 2 weeks if you have not heard from the specialist office to schedule.

## 2024-06-10 NOTE — Progress Notes (Unsigned)
 "      Melissa [name] , 1934/09/23, 89 y.o., female MRN: [id number] Patient Care Team    Relationship Specialty Notifications Start End  Melissa [name] LABOR, DO PCP - General Family Medicine  07/03/18   [name], Melissa LABOR, MD Consulting Physician Ophthalmology  07/03/18   Melissa Harland BROCKS, MD Consulting Physician Obstetrics and Gynecology  07/04/18   Melissa [name] Melissa Bobie FORBES, MD Consulting Physician Obstetrics and Gynecology  02/18/20   Pll[facility], [facility]     12/01/21   Melissa Chew, MD Consulting Physician Orthopedic Surgery  06/10/24     Chief Complaint  Patient presents with   Arm Injury    ED F/U.      Subjective: Melissa [name] is a 89 y.o. Pt presents for an OV for a follow-up visit after ED 06/08/2024 for closed fracture of proximal end of right humerus.  Patient reported that she was walking her dog when she tripped, landing on her right side.  Patient presented to the ED with pain in her right shoulder, elbow and forearm.  Patient was placed in a sling and prescribed***for pain. She was restarted on Eliquis  prophylaxis for 2 weeks by emergency room, due to her prior history of DVT with fracture. Patient reports she did not lose consciousness or hit her head.     03/05/2024   12:04 PM 11/01/2023    1:59 PM 05/09/2023   10:28 AM 12/26/2022    9:33 AM 10/26/2022    1:32 PM  Depression screen PHQ 2/9  Decreased Interest 0 0 0 0 0  Down, Depressed, Hopeless 0 2 0 0 0  PHQ - 2 Score 0 2 0 0 0  Altered sleeping  1     Tired, decreased energy  0     Change in appetite  1     Feeling bad or failure about yourself   0     Trouble concentrating  0     Moving slowly or fidgety/restless  0     Suicidal thoughts  0     PHQ-9 Score  4      Difficult doing work/chores  Not difficult at all        Data saved with a previous flowsheet row definition    Allergies[1] Social History   Social History Narrative   Marital status/children/pets: Widowed.    Education/employment: 12th grade education.  Retired.   Safety:      -smoke alarm in the home:Yes     - wears seatbelt: Yes     - Feels safe in their relationships: Yes   Past Medical History:  Diagnosis Date   Burning tongue syndrome 11/11/2015   Colon polyps    Cystocele with prolapse 2019   Dr. Starla- Monitor for now.    Detached retina, right    Diabetes mellitus    Diaphragmatic hernia    Diverticulitis 01/26/2022   GERD (gastroesophageal reflux disease)    Humerus fracture 02/26/2023   Hyperlipidemia    Insomnia    Macular degeneration    Past Surgical History:  Procedure Laterality Date   ABDOMINAL HYSTERECTOMY     BREAST CYST ASPIRATION  11/03/2020   FOOT SURGERY     OOPHORECTOMY     Rt.ovary removed with Hyst   RETINAL DETACHMENT SURGERY     TONSILLECTOMY     Family History  Problem Relation Age of Onset   Heart disease Other    Breast cancer Other  Breast cancer Mother        metastatic--to bone   CAD Father    Cancer Maternal Grandmother        unknown   Cancer Paternal Grandmother        colon   Allergies as of 06/10/2024   No Known Allergies      Medication List        Accurate as of June 10, 2024 11:56 AM. If you have any questions, ask your nurse or doctor.          albuterol  108 (90 Base) MCG/ACT inhaler Commonly known as: VENTOLIN  HFA Inhale 2 puffs into the lungs every 6 (six) hours as needed for wheezing or shortness of breath.   apixaban  2.5 MG Tabs tablet Commonly known as: Eliquis  Take 1 tablet (2.5 mg total) by mouth 2 (two) times daily.   atorvastatin  40 MG tablet Commonly known as: LIPITOR Take 1 tablet (40 mg total) by mouth daily.   dicyclomine  10 MG capsule Commonly known as: BENTYL  1 tab daily prn , take about 30 minutes before meal.   escitalopram  5 MG tablet Commonly known as: Lexapro  Take 1 tablet (5 mg total) by mouth daily.   gabapentin  300 MG capsule Commonly known as: NEURONTIN  Take 2 capsules  (600 mg total) by mouth at bedtime.   metFORMIN  500 MG tablet Commonly known as: GLUCOPHAGE  TAKE 1 TABLET EVERY MORNING & 2 TABLETS AT NIGHT   metoprolol  succinate 50 MG 24 hr tablet Commonly known as: TOPROL -XL Take 1 tablet (50 mg total) by mouth daily. Take with or immediately following a meal.   multivitamin tablet Take 1 tablet by mouth daily.   ondansetron  4 MG disintegrating tablet Commonly known as: ZOFRAN -ODT Take 1 tablet (4 mg total) by mouth every 8 (eight) hours as needed for nausea or vomiting. Started by: [name] Bellini, DO   oxyCODONE  5 MG immediate release tablet Commonly known as: Roxicodone  Take 1 tablet (5 mg total) by mouth every 6 (six) hours as needed for severe pain (pain score 7-10).   Oyster Shell Calcium /D 250-125 MG-UNIT Tabs Take by mouth.   pantoprazole  40 MG tablet Commonly known as: PROTONIX  Take 1 tablet (40 mg total) by mouth daily.        All past medical history, surgical history, allergies, family history, immunizations andmedications were updated in the EMR today and reviewed under the history and medication portions of their EMR.     ROS Negative, with the exception of above mentioned in HPI   Objective:  BP 122/70   Pulse 70   Temp 98.2 F (36.8 C)   Wt 115 lb 12.8 oz (52.5 kg)   SpO2 96%   BMI 21.88 kg/m  Body mass index is 21.88 kg/m. Physical Exam Vitals and nursing note reviewed.  Constitutional:      General: She is not in acute distress.    Appearance: Normal appearance. She is normal weight. She is not ill-appearing or toxic-appearing.  HENT:     Head: Normocephalic and atraumatic.  Eyes:     General: No scleral icterus.       Right eye: No discharge.        Left eye: No discharge.     Extraocular Movements: Extraocular movements intact.     Conjunctiva/sclera: Conjunctivae normal.     Pupils: Pupils are equal, round, and reactive to light.  Skin:    Findings: No rash.  Neurological:     Mental Status: She  is alert  and oriented to person, place, and time. Mental status is at baseline.     Motor: No weakness.     Coordination: Coordination normal.     Gait: Gait normal.  Psychiatric:        Mood and Affect: Mood normal.        Behavior: Behavior normal.        Thought Content: Thought content normal.        Judgment: Judgment normal.    ***  No results found. No results found. No results found for this or any previous visit (from the past 24 hours).  Assessment/Plan: Disa Riedlinger is a 89 y.o. female present for OV for  *** Reviewed expectations re: course of current medical issues. Discussed self-management of symptoms.  She had got off the couch and went to the bathroom by herself and then she got would you stop that she just make another mass I got a clean up so yeah I said let me help you because she was in there so you so you are you were you able to get in the house, so she came over I got a call at 645, last Saturday when she was calling back to go but she had more she said she started getting she had a little nausea that might just come with the pain or following send she just dry heaves she and then she gets a little nauseous but yesterday because I gave her Peppermint with meal yeah but the pain medicines make you nauseous but we did not you can get nauseous to the following people's you get a very nauseated feeling and that can sometimes mean he fractured something and her she does not have very good appetite eating right I think she is losing weight every time she comes to being less but she just said so just harder all what she is in a pizza like dominoes crucial following so the last night she got home has like so what do you want also we settled well a foot long so from and she agreed that I also got it back there.  I am making it is already cut in half and she may cut her half-and-half so she only has to let her leg up this morning I did she got some oatmeal and fiber things  because myxedema causing her to call things where it nerves I will paralegal but I think the notes in my AMS while sleeping and it was yeah so but she did all that and I gave her medicine we put it in some applesauce but she needs something for her appetite, we can get we can get there down the road because I do not want to mix anything really oxycodone  oxy okay yeah I got a cut in his low she got Outlined signs and symptoms indicating need for more acute intervention. Patient verbalized understanding and all questions were answered. Patient received an After-Visit Summary.    No orders of the defined types were placed in this encounter.  Meds ordered this encounter  Medications   ondansetron  (ZOFRAN -ODT) 4 MG disintegrating tablet    Sig: Take 1 tablet (4 mg total) by mouth every 8 (eight) hours as needed for nausea or vomiting.    Dispense:  20 tablet    Refill:  0   Referral Orders  No referral(s) requested today     Note is dictated utilizing voice recognition software. Although note has been proof read prior to signing,  occasional typographical errors still can be missed. If any questions arise, please do not hesitate to call for verification.   electronically signed by:  [name] Bellini, DO  [facility] [place]       [1] No Known Allergies  "

## 2024-06-11 ENCOUNTER — Encounter: Payer: Self-pay | Admitting: Family Medicine

## 2024-06-13 ENCOUNTER — Encounter: Payer: Self-pay | Admitting: Oncology

## 2024-06-13 NOTE — Assessment & Plan Note (Addendum)
-   Bilateral pulmonary emboli diagnosed in September 2024. No recent surgeries, long trips, or immobilization. No current symptoms of chest pain or shortness of breath. On Eliquis  5 mg twice daily without any reported side effects or bleeding.  -Given bilateral pulmonary emboli, advised anticoagulation for minimum of 6 months.  -Based on history, pulmonary emboli seems to be unprovoked.   Hence we obtained thrombophilia workup on her initial consultation with us  on 05/09/2023.  Prothrombin gene mutation, factor V Leiden mutation, beta-2  glycoprotein antibodies, anticardiolipin antibodies were all negative.  D-dimer was 0.32.  Rest of the hypercoagulable workup was deferred given the fact that she had recent thromboembolism and current Eliquis  use, which can result in falsely abnormal labs.  Following completion of 6 months of anticoagulation, Eliquis  was dose reduced to prophylactic dosing of 2.5 mg p.o. twice daily starting from March 2025.  We completed remaining thrombophilia workup in March 2025.  Protein C activity, protein S activity, Antithrombin III  activity were all normal.  No evidence of lupus anticoagulant.  There was a misunderstanding and patient discontinued anticoagulation.  On her follow-up visit with us  on 03/05/2024, D-dimer was increased at 1.22.  Given chronic varicose veins with increased risk of clotting, patient was agreeable to going back on Eliquis  2.5 mg twice daily.  Resumed this from 03/05/2024.  Patient opted to remain off of anticoagulation and maintained on daily aspirin . D-dimer has improved since the acute event but remains mildly elevated. She is currently asymptomatic for recurrence. Due to advanced age and elevated bleeding risk, anticoagulation has been withheld, with a plan for close monitoring unless recurrence occurs. - Reviewed D-dimer results and trend with her. - Advised continuation of daily aspirin . - Discussed that if D-dimer increases or symptoms recur,  imaging will be ordered to evaluate for recurrent thromboembolism. - Discussed that recurrent pulmonary embolism would necessitate lifelong anticoagulation. - Scheduled follow-up in four months for close monitoring given she is not on anticoagulation.

## 2024-06-24 ENCOUNTER — Other Ambulatory Visit: Payer: Self-pay

## 2024-06-24 ENCOUNTER — Encounter (HOSPITAL_COMMUNITY): Payer: Self-pay | Admitting: Orthopedic Surgery

## 2024-06-24 NOTE — Progress Notes (Signed)
 Attempted to obtain medical history via telephone, unable to reach at this time. HIPAA compliant voicemail message left requesting return call to pre surgical testing department.

## 2024-06-24 NOTE — Progress Notes (Addendum)
 Unable to speak with [name] or family member via telephone left pre op instructions on Melissa [name] (daughter) voicemail [phone].  I will give chart to Melissa Ellen RN to attempt to reach [name] again before our department closes for the day.  The information below was obtained via chart review only   For Anesthesia: PCP - Melissa DELENA Bellini, DO last office Visit note 06/10/24 in [facility] Cardiologist - Melissa Agent, MD last office Visit note 01/24/24 in [facility] Oncologist-Melissa [name], Chinita, MD last office Visit note 06/04/24   Bowel Prep reminder:  Chest x-ray - greater than 1 year in [facility] EKG - [facility] Test -  ECHO - 05/20/23 in [facility] Cardiac Cath -  Pacemaker/ICD device last checked: Pacemaker orders received: Device Rep notified:  Spinal Cord Stimulator:  Sleep Study -  CPAP -   Fasting Blood Sugar -  Checks Blood Sugar _____ times a day Date and result of last Hgb A1c-  Last dose of GLP1 agonist-  GLP1 instructions: Hold 7 days prior to schedule (Hold 24 hours-daily)   Last dose of SGLT-2 inhibitors-  SGLT-2 instructions: Hold 72 hours prior to surgery  Blood Thinner Instructions: Eliquis  Last Dose: Time last taken:  Aspirin  Instructions: Last Dose: Time last taken:  Activity level: Can go up a flight of stairs and activities of daily living without stopping and without chest pain and/or shortness of breath   Able to exercise without chest pain and/or shortness of breath   Unable to go up a flight of stairs without chest pain and/or shortness of breath     Anesthesia review: Paroxysmal atrial fibrillation, Diabetes, History of pulmonary embolism, Moderate Mitral Valve stenosis,    [name] denies shortness of breath, fever, cough and chest pain at PAT appointment   [name] verbalized understanding of instructions that were reviewed over the telephone.

## 2024-06-24 NOTE — Progress Notes (Signed)
 Sent message, via epic in basket, requesting orders in epic from Careers adviser.

## 2024-06-24 NOTE — Progress Notes (Addendum)
 Anesthesia Review:  PCP: Jenna Bellini  LOV 06/10/2024  Cardiologist : hochrein LOV 01/24/24   PPM/ ICD: Device Orders: Rep Notified:  Chest x 02/03/24 -ray : EKG :02/03/24  Echo : Stress test: Cardiac Cath :   Activity level: can do a flight of stairs without difficutly  Sleep Study/ CPAP : none  Fasting Blood Sugar :      / Checks Blood Sugar -- times a day:     DM- type 2 - checks glucose 3x per week   Blood Thinner/ Instructions /Last Dose: ASA / Instructions/ Last Dose :    Eliquis - last dose on 06/24/24 in the am per daughter.  Burnard Odis BARRE aware.    Completed med hx with daughter via phone.  Completed preop instructions with daughter via phone on 06/24/24.  Daughter is aware pt should take a sponge bath tonite and place clean sheets on bed and clean pajamas prior to bedtime.   No foods after midnite but pt may have water , apple juice, white grape juice and white cranberryjuice , coffee and tea with no milk cream or creamer and gatorade utnil 1015 am.  PT to eat a good healthy snack prior to midnite such as peanut butter crackers, cheese crackers or turkey sandwich and fruit.   No food after midnite.

## 2024-06-24 NOTE — H&P (Signed)
 SHOULDER PREOPERATIVE H&P  Chief Complaint: right shoulder pain  HPI: Melissa [name] is a 89 y.o. female who presents for preoperative history and physical with a diagnosis of a right proximal humerus fracture.  Approximately two weeks ago, she sustained a fall four days before Christmas, resulting in a severe fracture of the right shoulder. She has a history of a similar injury to the left shoulder in 2024, which was treated non-surgically due to less severity. Melissa [name] has diabetes and GERD, and she is currently on Eliquis  due to a recent pulmonary embolism following this fall. She reports a history of blood clots in her lungs, which were identified after a nurse's home visit. X-rays reveal a comminuted fracture of the humeral head with multiple fragments. This is significantly impairing activities of daily living.  She has elected for surgical management.   Past Medical History:  Diagnosis Date   Burning tongue syndrome 11/11/2015   Colon polyps    Cystocele with prolapse 2019   Dr. Starla- Monitor for now.    Detached retina, right    Diabetes mellitus    Diaphragmatic hernia    Diverticulitis 01/26/2022   Dyspnea    GERD (gastroesophageal reflux disease)    Humerus fracture 02/26/2023   Hyperlipidemia    Insomnia    Macular degeneration    Moderate mitral valve stenosis    Noted on ECHO   Paroxysmal atrial fibrillation (HCC) 05/2023   PONV (postoperative nausea and vomiting)    Pulmonary embolism (HCC) 2024   after humerus fracture   Right bundle branch block (RBBB)    Noted on EKG   Past Surgical History:  Procedure Laterality Date   ABDOMINAL HYSTERECTOMY     BREAST CYST ASPIRATION  11/03/2020   FOOT SURGERY     OOPHORECTOMY     Rt.ovary removed with Hyst   RETINAL DETACHMENT SURGERY     TONSILLECTOMY     Social History   Socioeconomic History   Marital status: Widowed    Spouse name: Not on file   Number of children: Not on file   Years of education: Not on  file   Highest education level: Not on file  Occupational History   Not on file  Tobacco Use   Smoking status: Never   Smokeless tobacco: Never  Vaping Use   Vaping status: Never Used  Substance and Sexual Activity   Alcohol use: No   Drug use: No   Sexual activity: Not Currently    Birth control/protection: Surgical    Comment: Hyst--Lt.ovary remains  Other Topics Concern   Not on file  Social History Narrative   Marital status/children/pets: Widowed.   Education/employment: 12th grade education.  Retired.   Safety:      -smoke alarm in the home:Yes     - wears seatbelt: Yes     - Feels safe in their relationships: Yes   Social Drivers of Health   Tobacco Use: Low Risk (06/24/2024)   Patient History    Smoking Tobacco Use: Never    Smokeless Tobacco Use: Never    Passive Exposure: Not on file  Financial Resource Strain: Low Risk (11/01/2023)   Overall Financial Resource Strain (CARDIA)    Difficulty of Paying Living Expenses: Not hard at all  Food Insecurity: No Food Insecurity (11/01/2023)   Hunger Vital Sign    Worried About Running Out of Food in the Last Year: Never true    Ran Out of Food in the Last Year: Never true  Transportation Needs: No Transportation Needs (11/01/2023)   PRAPARE - Administrator, Civil Service (Medical): No    Lack of Transportation (Non-Medical): No  Physical Activity: Inactive (11/01/2023)   Exercise Vital Sign    Days of Exercise per Week: 0 days    Minutes of Exercise per Session: 0 min  Stress: No Stress Concern Present (11/01/2023)   [employer] of Occupational Health - Occupational Stress Questionnaire    Feeling of Stress : Not at all  Social Connections: Moderately Isolated (11/01/2023)   Social Connection and Isolation Panel    Frequency of Communication with Friends and Family: Twice a week    Frequency of Social Gatherings with Friends and Family: Three times a week    Attends Religious Services: More than 4  times per year    Active Member of Clubs or Organizations: No    Attends Banker Meetings: Never    Marital Status: Widowed  Depression (PHQ2-9): Low Risk (03/05/2024)   Depression (PHQ2-9)    PHQ-2 Score: 0  Alcohol Screen: Low Risk (11/01/2023)   Alcohol Screen    Last Alcohol Screening Score (AUDIT): 0  Housing: Unknown (11/01/2023)   Housing Stability Vital Sign    Unable to Pay for Housing in the Last Year: No    Number of Times Moved in the Last Year: Not on file    Homeless in the Last Year: No  Utilities: Not At Risk (11/01/2023)   AHC Utilities    Threatened with loss of utilities: No  Health Literacy: Adequate Health Literacy (11/01/2023)   B1300 Health Literacy    Frequency of need for help with medical instructions: Never   Family History  Problem Relation Age of Onset   Heart disease Other    Breast cancer Other    Breast cancer Mother        metastatic--to bone   CAD Father    Cancer Maternal Grandmother        unknown   Cancer Paternal Grandmother        colon   Allergies[1] Prior to Admission medications  Medication Sig Start Date End Date Taking? Authorizing Provider  apixaban  (ELIQUIS ) 2.5 MG TABS tablet Take 1 tablet (2.5 mg total) by mouth 2 (two) times daily. Patient taking differently: Take 2.5 mg by mouth 2 (two) times daily. Last dose 06/24/24 am 06/08/24 06/22/24  Randol Simmonds, MD  atorvastatin  (LIPITOR) 40 MG tablet Take 1 tablet (40 mg total) by mouth daily. 03/26/24   Kuneff, Renee A, DO  escitalopram  (LEXAPRO ) 5 MG tablet Take 1 tablet (5 mg total) by mouth daily. 03/26/24   Kuneff, Renee A, DO  gabapentin  (NEURONTIN ) 300 MG capsule Take 2 capsules (600 mg total) by mouth at bedtime. 03/26/24   Kuneff, Renee A, DO  metFORMIN  (GLUCOPHAGE ) 500 MG tablet TAKE 1 TABLET EVERY MORNING & 2 TABLETS AT NIGHT Patient taking differently: Take 500 mg by mouth daily. TAKE 1 TABLET EVERY MORNING & 2 TABLETS AT NIGHT 03/26/24   Kuneff, Renee A, DO  metoprolol   succinate (TOPROL -XL) 50 MG 24 hr tablet Take 1 tablet (50 mg total) by mouth daily. Take with or immediately following a meal. 07/17/23 06/10/24  Burnard Debby LABOR, MD  Multiple Vitamin (MULTIVITAMIN) tablet Take 1 tablet by mouth daily.    [provider]  oxyCODONE  (ROXICODONE ) 5 MG immediate release tablet Take 1 tablet (5 mg total) by mouth every 6 (six) hours as needed for severe pain (pain score 7-10).  06/08/24   Randol Simmonds, MD  pantoprazole  (PROTONIX ) 40 MG tablet Take 1 tablet (40 mg total) by mouth daily. 03/26/24   Kuneff, Renee A, DO     Positive ROS: All other systems have been reviewed and were otherwise negative with the exception of those mentioned in the HPI and as above.  Physical Exam: General: Alert, no acute distress Cardiovascular: No pedal edema Respiratory: No cyanosis, no use of accessory musculature GI: No organomegaly, abdomen is soft and non-tender Skin: No lesions in the area of chief complaint Neurologic: Sensation intact distally Psychiatric: Patient is competent for consent with normal mood and affect Lymphatic: No axillary or cervical lymphadenopathy  MUSCULOSKELETAL: On examination, the patient is able to wiggle her fingers, indicating some preserved function. There is significant tenderness and limited range of motion in the right shoulder. The left shoulder appears to have healed well from the previous fracture.  X-rays of the right shoulder taken at our office, along with previous emergency room X-rays, reveal a comminuted fracture of the humeral head with multiple fragments. Independent interpretation confirms the severity of the fracture, necessitating surgical intervention.  Assessment: Comminuted right proximal humerus fracture   Plan: Plan for Procedures: ARTHROPLASTY, SHOULDER, TOTAL, REVERSE  The risks benefits and alternatives were discussed with the patient including but not limited to the risks of nonoperative treatment, versus  surgical intervention including infection, bleeding, nerve injury,  blood clots, cardiopulmonary complications, morbidity, mortality, among others, and they were willing to proceed.    Shabnam Ladd K Emmert Roethler, PA-C    06/24/2024 4:35 PM    [1] No Known Allergies

## 2024-06-24 NOTE — Progress Notes (Signed)
 " Case: [id number] Date/Time: 06/25/24 1314   Procedure: ARTHROPLASTY, SHOULDER, TOTAL, REVERSE (Right: Shoulder)   Anesthesia type: General   Diagnosis: Other closed displaced fracture of proximal end of right humerus, initial encounter [S42.291A]   Pre-op diagnosis: closed displaced fracture of proximal end of right humerus   Location: [facility] ROOM 05 / [facility]   Surgeons: Josefina Chew, MD       DISCUSSION: Melissa [name] is a 89 yo female with PMH of hx of PE, PAF no anticoagulated, moderate mitral stenosis, mod-severe TR, diaphragmatic hernia, GERD, DM.  Patient seen in the ED for a fall on 06/08/24 and diagnosed with a R humerus fx. She was restarted on low dose Eliquis  which had previously been discontinued due to hx of DVT after a prior humerus fx. Now scheduled for surgery above.   Patient follows with Cardiology for PAF. She is not anticoagulated due to financial reasons reportedly. Last event monitoring in 06/2023 showed A.fib burden of 1%. She was last seen in clinic on 01/24/24 by Dr. Lavona: When she saw hematology oncology there was no hypercoagulable state and it was thought that there was no absolute indication to remain on anticoagulation.  It was all optional.  Therefore, after patient engagement and enrollment in her care she wants to stay off anticoagulation.  She should take an aspirin .  She would let me know if she has palpitations in the future.  Patient diagnosed with a PE in 02/2023. Hypercoagulable w/u was negative. She was advised to remain on low dose Eliquis  however patient did not want to do that so is on ASA only.  LD Eliquis : 1/5 AM  VS:  Wt Readings from Last 3 Encounters:  06/10/24 52.5 kg  06/08/24 52.2 kg  06/04/24 52.3 kg   Temp Readings from Last 3 Encounters:  06/10/24 36.8 C  06/08/24 36.6 C (Oral)  06/04/24 (!) 35.7 C (Temporal)   BP Readings from Last 3 Encounters:  06/10/24 122/70  06/08/24 101/73  06/04/24 113/65   Pulse Readings from Last 3  Encounters:  06/10/24 70  06/08/24 74  06/04/24 77     PROVIDERS: Kuneff, Renee A, DO   LABS: Obtain DOS   EKG 01/24/24:  NSR Low voltage QRS RBBB   Event monitor 06/23/23:  The predominant rhythm was sinus rhythm with an average rate at 84 bpm (range 62 to 135 bpm).  The patient had an atrial fibrillation burden of 1% with the fastest interval on December 11 at 8:21 PM with an average rate of 146 (range 99-1 87.  Longest atrial fibrillation episode was on May 31, 2023 lasting 1 hour and 13 minutes.  There were 4 episodes of SVT, maximal rate 152 bpm, average 120 with the longest interval lasting 17 seconds.  There were rare atrial and ventricular premature beats.  Echo 05/20/23:  IMPRESSIONS    1. Left ventricular ejection fraction, by estimation, is 70 to 75%. The left ventricle has hyperdynamic function. The left ventricle has no regional wall motion abnormalities. There is mild left ventricular hypertrophy. Left ventricular diastolic parameters are indeterminate. Elevated left atrial pressure.  2. Right ventricular systolic function is normal. The right ventricular size is normal. There is normal pulmonary artery systolic pressure.  3. The mitral valve is abnormal. No evidence of mitral valve regurgitation. Moderate mitral stenosis. The mean mitral valve gradient is 6.0 mmHg. HR 90 bpm. Severe mitral annular calcification.  4. Tricuspid valve regurgitation is moderate to severe.  5. The aortic valve has  an indeterminant number of cusps. There is moderate calcification of the aortic valve. There is moderate thickening of the aortic valve. Aortic valve regurgitation is trivial. Aortic valve sclerosis is present, with no evidence of aortic valve stenosis.   6. There is Moderate (Grade III) atheroma plaque.  7. The inferior vena cava is normal in size with greater than 50% respiratory variability, suggesting right atrial pressure of 3 mmHg.  8. Limited echo in setting  of new onset afib  Past Medical History:  Diagnosis Date   Burning tongue syndrome 11/11/2015   Colon polyps    Cystocele with prolapse 2019   Dr. Starla- Monitor for now.    Detached retina, right    Diabetes mellitus    Diaphragmatic hernia    Diverticulitis 01/26/2022   GERD (gastroesophageal reflux disease)    Humerus fracture 02/26/2023   Hyperlipidemia    Insomnia    Macular degeneration    Moderate mitral valve stenosis    Noted on ECHO   Paroxysmal atrial fibrillation (HCC) 05/2023   Pulmonary embolism (HCC) 2024   after humerus fracture   Right bundle branch block (RBBB)    Noted on EKG    Past Surgical History:  Procedure Laterality Date   ABDOMINAL HYSTERECTOMY     BREAST CYST ASPIRATION  11/03/2020   FOOT SURGERY     OOPHORECTOMY     Rt.ovary removed with Hyst   RETINAL DETACHMENT SURGERY     TONSILLECTOMY      MEDICATIONS:  albuterol  (VENTOLIN  HFA) 108 (90 Base) MCG/ACT inhaler   apixaban  (ELIQUIS ) 2.5 MG TABS tablet   atorvastatin  (LIPITOR) 40 MG tablet   Calcium  Carbonate-Vitamin D  (OYSTER SHELL CALCIUM /D) 250-125 MG-UNIT TABS   dicyclomine  (BENTYL ) 10 MG capsule   escitalopram  (LEXAPRO ) 5 MG tablet   gabapentin  (NEURONTIN ) 300 MG capsule   metFORMIN  (GLUCOPHAGE ) 500 MG tablet   metoprolol  succinate (TOPROL -XL) 50 MG 24 hr tablet   Multiple Vitamin (MULTIVITAMIN) tablet   ondansetron  (ZOFRAN -ODT) 4 MG disintegrating tablet   oxyCODONE  (ROXICODONE ) 5 MG immediate release tablet   pantoprazole  (PROTONIX ) 40 MG tablet    Burnard CHRISTELLA Odis DEVONNA [facility] Short Stay/Anesthesiology [facility] Phone [phone] 06/24/2024 3:15 PM       "

## 2024-06-25 ENCOUNTER — Encounter (HOSPITAL_COMMUNITY): Payer: Self-pay | Admitting: Orthopedic Surgery

## 2024-06-25 ENCOUNTER — Ambulatory Visit (HOSPITAL_COMMUNITY): Admitting: Medical

## 2024-06-25 ENCOUNTER — Inpatient Hospital Stay (HOSPITAL_COMMUNITY)
Admission: RE | Admit: 2024-06-25 | Discharge: 2024-07-01 | DRG: 483 | Disposition: A | Discharge status: Skilled Nursing Facility | Attending: Orthopedic Surgery | Admitting: Orthopedic Surgery

## 2024-06-25 ENCOUNTER — Encounter (HOSPITAL_COMMUNITY)
Admission: RE | Disposition: A | Discharge status: Skilled Nursing Facility | Payer: Self-pay | Source: Home / Self Care | Attending: Orthopedic Surgery

## 2024-06-25 ENCOUNTER — Observation Stay (HOSPITAL_COMMUNITY)

## 2024-06-25 DIAGNOSIS — Z7982 Long term (current) use of aspirin: Secondary | ICD-10-CM

## 2024-06-25 DIAGNOSIS — Z86718 Personal history of other venous thrombosis and embolism: Secondary | ICD-10-CM

## 2024-06-25 DIAGNOSIS — Z90710 Acquired absence of both cervix and uterus: Secondary | ICD-10-CM

## 2024-06-25 DIAGNOSIS — Z7984 Long term (current) use of oral hypoglycemic drugs: Secondary | ICD-10-CM

## 2024-06-25 DIAGNOSIS — S42201D Unspecified fracture of upper end of right humerus, subsequent encounter for fracture with routine healing: Secondary | ICD-10-CM | POA: Diagnosis not present

## 2024-06-25 DIAGNOSIS — I10 Essential (primary) hypertension: Secondary | ICD-10-CM | POA: Diagnosis not present

## 2024-06-25 DIAGNOSIS — Z96611 Presence of right artificial shoulder joint: Secondary | ICD-10-CM

## 2024-06-25 DIAGNOSIS — T45516A Underdosing of anticoagulants, initial encounter: Secondary | ICD-10-CM | POA: Diagnosis present

## 2024-06-25 DIAGNOSIS — F419 Anxiety disorder, unspecified: Secondary | ICD-10-CM

## 2024-06-25 DIAGNOSIS — E785 Hyperlipidemia, unspecified: Secondary | ICD-10-CM | POA: Diagnosis present

## 2024-06-25 DIAGNOSIS — S42241A 4-part fracture of surgical neck of right humerus, initial encounter for closed fracture: Principal | ICD-10-CM | POA: Diagnosis present

## 2024-06-25 DIAGNOSIS — I480 Paroxysmal atrial fibrillation: Secondary | ICD-10-CM | POA: Diagnosis present

## 2024-06-25 DIAGNOSIS — Z860100 Personal history of colon polyps, unspecified: Secondary | ICD-10-CM

## 2024-06-25 DIAGNOSIS — K219 Gastro-esophageal reflux disease without esophagitis: Secondary | ICD-10-CM | POA: Diagnosis present

## 2024-06-25 DIAGNOSIS — Z79899 Other long term (current) drug therapy: Secondary | ICD-10-CM

## 2024-06-25 DIAGNOSIS — E119 Type 2 diabetes mellitus without complications: Secondary | ICD-10-CM

## 2024-06-25 DIAGNOSIS — Z01818 Encounter for other preprocedural examination: Secondary | ICD-10-CM

## 2024-06-25 DIAGNOSIS — Z90721 Acquired absence of ovaries, unilateral: Secondary | ICD-10-CM

## 2024-06-25 DIAGNOSIS — Z86711 Personal history of pulmonary embolism: Secondary | ICD-10-CM

## 2024-06-25 DIAGNOSIS — Z8249 Family history of ischemic heart disease and other diseases of the circulatory system: Secondary | ICD-10-CM

## 2024-06-25 DIAGNOSIS — E1165 Type 2 diabetes mellitus with hyperglycemia: Secondary | ICD-10-CM | POA: Diagnosis not present

## 2024-06-25 DIAGNOSIS — Z91128 Patient's intentional underdosing of medication regimen for other reason: Secondary | ICD-10-CM

## 2024-06-25 DIAGNOSIS — Z803 Family history of malignant neoplasm of breast: Secondary | ICD-10-CM

## 2024-06-25 HISTORY — PX: REVERSE SHOULDER ARTHROPLASTY: SHX5054

## 2024-06-25 LAB — BASIC METABOLIC PANEL WITH GFR
Anion gap: 16 — ABNORMAL HIGH (ref 5–15)
BUN: 19 mg/dL (ref 8–23)
CO2: 18 mmol/L — ABNORMAL LOW (ref 22–32)
Calcium: 8.1 mg/dL — ABNORMAL LOW (ref 8.9–10.3)
Chloride: 105 mmol/L (ref 98–111)
Creatinine, Ser: 0.77 mg/dL (ref 0.44–1.00)
GFR, Estimated: >60 mL/min
Glucose, Bld: 89 mg/dL (ref 70–99)
Potassium: 5.2 mmol/L — ABNORMAL HIGH (ref 3.5–5.1)
Sodium: 139 mmol/L (ref 135–145)

## 2024-06-25 LAB — GLUCOSE, CAPILLARY
Glucose-Capillary: 80 mg/dL (ref 70–99)
Glucose-Capillary: 100 mg/dL — ABNORMAL HIGH (ref 70–99)

## 2024-06-25 LAB — SURGICAL PCR SCREEN
MRSA, PCR: NEGATIVE
Staphylococcus aureus: NEGATIVE

## 2024-06-25 LAB — HEMOGLOBIN A1C
Hgb A1c MFr Bld: 6.3 % — ABNORMAL HIGH (ref 4.8–5.6)
Mean Plasma Glucose: 134.11 mg/dL

## 2024-06-25 MED ORDER — METOCLOPRAMIDE HCL 5 MG/ML IJ SOLN: 5.0000 mg | Freq: Three times a day (TID) | INTRAMUSCULAR | Status: DC | PRN

## 2024-06-25 MED ORDER — METOCLOPRAMIDE HCL 5 MG PO TABS: 5.0000 mg | ORAL_TABLET | Freq: Three times a day (TID) | ORAL | Status: DC | PRN

## 2024-06-25 MED ORDER — HYDROCODONE-ACETAMINOPHEN 7.5-325 MG PO TABS
1.0000 | ORAL_TABLET | ORAL | Status: DC | PRN
  Administered 2024-06-27: 1 via ORAL
  Filled 2024-06-25: qty 1

## 2024-06-25 MED ORDER — MAGNESIUM CITRATE PO SOLN: 1.0000 | Freq: Once | ORAL | Status: DC | PRN

## 2024-06-25 MED ORDER — ESCITALOPRAM OXALATE 10 MG PO TABS
5.0000 mg | ORAL_TABLET | Freq: Every day | ORAL | Status: DC
  Administered 2024-06-26 – 2024-07-01 (×6): 5 mg via ORAL
  Filled 2024-06-25 (×6): qty 1

## 2024-06-25 MED ORDER — SUGAMMADEX SODIUM 500 MG/5ML IV SOLN
INTRAVENOUS | Status: DC | PRN
  Administered 2024-06-25: 100 mg via INTRAVENOUS

## 2024-06-25 MED ORDER — OXYCODONE HCL 5 MG/5ML PO SOLN: 5.0000 mg | Freq: Once | ORAL | Status: DC | PRN

## 2024-06-25 MED ORDER — APIXABAN 2.5 MG PO TABS
2.5000 mg | ORAL_TABLET | Freq: Two times a day (BID) | ORAL | Status: DC
  Administered 2024-06-26 – 2024-07-01 (×11): 2.5 mg via ORAL
  Filled 2024-06-25 (×11): qty 1

## 2024-06-25 MED ORDER — OXYCODONE HCL 5 MG PO TABS: 5.0000 mg | ORAL_TABLET | Freq: Once | ORAL | Status: DC | PRN

## 2024-06-25 MED ORDER — PANTOPRAZOLE SODIUM 40 MG PO TBEC
40.0000 mg | DELAYED_RELEASE_TABLET | Freq: Every day | ORAL | Status: DC
  Administered 2024-06-26 – 2024-07-01 (×6): 40 mg via ORAL
  Filled 2024-06-25 (×6): qty 1

## 2024-06-25 MED ORDER — POVIDONE-IODINE 10 % EX SWAB: 2.0000 | Freq: Once | CUTANEOUS | Status: DC

## 2024-06-25 MED ORDER — INSULIN ASPART 100 UNIT/ML IJ SOLN
0.0000 [IU] | Freq: Three times a day (TID) | INTRAMUSCULAR | Status: DC
  Administered 2024-06-26 – 2024-06-28 (×4): 2 [IU] via SUBCUTANEOUS
  Administered 2024-06-29: 3 [IU] via SUBCUTANEOUS
  Administered 2024-06-30 – 2024-07-01 (×2): 2 [IU] via SUBCUTANEOUS
  Filled 2024-06-25: qty 2
  Filled 2024-06-25: qty 3
  Filled 2024-06-25 (×6): qty 2

## 2024-06-25 MED ORDER — METFORMIN HCL 500 MG PO TABS
500.0000 mg | ORAL_TABLET | Freq: Every day | ORAL | Status: DC
  Administered 2024-06-26 – 2024-07-01 (×6): 500 mg via ORAL
  Filled 2024-06-25 (×6): qty 1

## 2024-06-25 MED ORDER — PHENOL 1.4 % MT LIQD: 1.0000 | OROMUCOSAL | Status: DC | PRN

## 2024-06-25 MED ORDER — FENTANYL CITRATE (PF) 50 MCG/ML IJ SOSY: 25.0000 ug | PREFILLED_SYRINGE | INTRAMUSCULAR | Status: DC | PRN

## 2024-06-25 MED ORDER — MENTHOL 3 MG MT LOZG: 1.0000 | LOZENGE | OROMUCOSAL | Status: DC | PRN

## 2024-06-25 MED ORDER — FENTANYL CITRATE (PF) 100 MCG/2ML IJ SOLN
INTRAMUSCULAR | Status: DC | PRN
  Administered 2024-06-25 (×2): 50 ug via INTRAVENOUS

## 2024-06-25 MED ORDER — POLYETHYLENE GLYCOL 3350 17 G PO PACK: 17.0000 g | PACK | Freq: Every day | ORAL | Status: DC | PRN

## 2024-06-25 MED ORDER — PHENYLEPHRINE HCL-NACL 20-0.9 MG/250ML-% IV SOLN
INTRAVENOUS | Status: DC | PRN
  Administered 2024-06-25: 25 ug/min via INTRAVENOUS

## 2024-06-25 MED ORDER — LIDOCAINE 2% (20 MG/ML) 5 ML SYRINGE
INTRAMUSCULAR | Status: DC | PRN
  Administered 2024-06-25: 60 mg via INTRAVENOUS

## 2024-06-25 MED ORDER — CEFAZOLIN SODIUM-DEXTROSE 2-4 GM/100ML-% IV SOLN
2.0000 g | Freq: Four times a day (QID) | INTRAVENOUS | Status: AC
  Administered 2024-06-25 – 2024-06-26 (×2): 2 g via INTRAVENOUS
  Filled 2024-06-25 (×2): qty 100

## 2024-06-25 MED ORDER — ROCURONIUM BROMIDE 10 MG/ML (PF) SYRINGE
PREFILLED_SYRINGE | INTRAVENOUS | Status: AC
  Filled 2024-06-25: qty 10

## 2024-06-25 MED ORDER — ACETAMINOPHEN 500 MG PO TABS
500.0000 mg | ORAL_TABLET | Freq: Four times a day (QID) | ORAL | Status: AC
  Administered 2024-06-26 (×4): 500 mg via ORAL
  Filled 2024-06-25 (×4): qty 1

## 2024-06-25 MED ORDER — ORAL CARE MOUTH RINSE: 15.0000 mL | Freq: Once | OROMUCOSAL | Status: AC

## 2024-06-25 MED ORDER — FENTANYL CITRATE (PF) 50 MCG/ML IJ SOSY
50.0000 ug | PREFILLED_SYRINGE | INTRAMUSCULAR | Status: DC
  Administered 2024-06-25: 50 ug via INTRAVENOUS
  Filled 2024-06-25: qty 2

## 2024-06-25 MED ORDER — BUPIVACAINE HCL (PF) 0.5 % IJ SOLN
INTRAMUSCULAR | Status: DC | PRN
  Administered 2024-06-25: 10 mL

## 2024-06-25 MED ORDER — PROPOFOL 10 MG/ML IV BOLUS
INTRAVENOUS | Status: DC | PRN
  Administered 2024-06-25: 50 mg via INTRAVENOUS

## 2024-06-25 MED ORDER — CEFAZOLIN SODIUM-DEXTROSE 2-4 GM/100ML-% IV SOLN
2.0000 g | INTRAVENOUS | Status: AC
  Administered 2024-06-25: 2 g via INTRAVENOUS
  Filled 2024-06-25: qty 100

## 2024-06-25 MED ORDER — ACETAMINOPHEN 325 MG PO TABS
325.0000 mg | ORAL_TABLET | Freq: Four times a day (QID) | ORAL | Status: DC | PRN
  Administered 2024-06-30: 650 mg via ORAL
  Filled 2024-06-25: qty 2

## 2024-06-25 MED ORDER — CHLORHEXIDINE GLUCONATE 0.12 % MT SOLN
15.0000 mL | Freq: Once | OROMUCOSAL | Status: AC
  Administered 2024-06-25: 15 mL via OROMUCOSAL

## 2024-06-25 MED ORDER — ATORVASTATIN CALCIUM 40 MG PO TABS
40.0000 mg | ORAL_TABLET | Freq: Every day | ORAL | Status: DC
  Administered 2024-06-26 – 2024-07-01 (×6): 40 mg via ORAL
  Filled 2024-06-25 (×6): qty 1

## 2024-06-25 MED ORDER — METOPROLOL SUCCINATE ER 50 MG PO TB24
50.0000 mg | ORAL_TABLET | Freq: Every day | ORAL | Status: DC
  Administered 2024-06-26 – 2024-07-01 (×6): 50 mg via ORAL
  Filled 2024-06-25 (×6): qty 1

## 2024-06-25 MED ORDER — DOCUSATE SODIUM 100 MG PO CAPS
100.0000 mg | ORAL_CAPSULE | Freq: Two times a day (BID) | ORAL | Status: DC
  Administered 2024-06-25 – 2024-07-01 (×12): 100 mg via ORAL
  Filled 2024-06-25 (×12): qty 1

## 2024-06-25 MED ORDER — ONDANSETRON HCL 4 MG/2ML IJ SOLN
INTRAMUSCULAR | Status: DC | PRN
  Administered 2024-06-25: 4 mg via INTRAVENOUS

## 2024-06-25 MED ORDER — HYDROCODONE-ACETAMINOPHEN 5-325 MG PO TABS
1.0000 | ORAL_TABLET | ORAL | Status: DC | PRN
  Administered 2024-06-26: 1 via ORAL
  Filled 2024-06-25 (×2): qty 1

## 2024-06-25 MED ORDER — PHENYLEPHRINE 80 MCG/ML (10ML) SYRINGE FOR IV PUSH (FOR BLOOD PRESSURE SUPPORT)
PREFILLED_SYRINGE | INTRAVENOUS | Status: DC | PRN
  Administered 2024-06-25: 160 ug via INTRAVENOUS

## 2024-06-25 MED ORDER — ONDANSETRON HCL 4 MG/2ML IJ SOLN
4.0000 mg | Freq: Four times a day (QID) | INTRAMUSCULAR | Status: DC | PRN
  Administered 2024-06-26 – 2024-06-27 (×2): 4 mg via INTRAVENOUS
  Filled 2024-06-25 (×2): qty 2

## 2024-06-25 MED ORDER — DEXAMETHASONE SOD PHOSPHATE PF 10 MG/ML IJ SOLN
INTRAMUSCULAR | Status: DC | PRN
  Administered 2024-06-25: 4 mg via INTRAVENOUS

## 2024-06-25 MED ORDER — ROCURONIUM BROMIDE 10 MG/ML (PF) SYRINGE
PREFILLED_SYRINGE | INTRAVENOUS | Status: DC | PRN
  Administered 2024-06-25: 40 mg via INTRAVENOUS

## 2024-06-25 MED ORDER — DROPERIDOL 2.5 MG/ML IJ SOLN: 0.6250 mg | Freq: Once | INTRAMUSCULAR | Status: DC | PRN

## 2024-06-25 MED ORDER — GABAPENTIN 300 MG PO CAPS
600.0000 mg | ORAL_CAPSULE | Freq: Every day | ORAL | Status: DC
  Administered 2024-06-26 – 2024-06-30 (×5): 600 mg via ORAL
  Filled 2024-06-25 (×5): qty 2

## 2024-06-25 MED ORDER — ACETAMINOPHEN 10 MG/ML IV SOLN: 1000.0000 mg | Freq: Once | INTRAVENOUS | Status: DC | PRN

## 2024-06-25 MED ORDER — BUPIVACAINE LIPOSOME 1.3 % IJ SUSP
INTRAMUSCULAR | Status: DC | PRN
  Administered 2024-06-25: 10 mL

## 2024-06-25 MED ORDER — ONDANSETRON HCL 4 MG/2ML IJ SOLN
INTRAMUSCULAR | Status: AC
  Filled 2024-06-25: qty 2

## 2024-06-25 MED ORDER — TRANEXAMIC ACID-NACL 1000-0.7 MG/100ML-% IV SOLN
1000.0000 mg | INTRAVENOUS | Status: AC
  Administered 2024-06-25: 1000 mg via INTRAVENOUS
  Filled 2024-06-25: qty 100

## 2024-06-25 MED ORDER — ALUM & MAG HYDROXIDE-SIMETH 200-200-20 MG/5ML PO SUSP: 30.0000 mL | ORAL | Status: DC | PRN

## 2024-06-25 MED ORDER — BISACODYL 10 MG RE SUPP: 10.0000 mg | Freq: Every day | RECTAL | Status: DC | PRN

## 2024-06-25 MED ORDER — PROPOFOL 10 MG/ML IV BOLUS
INTRAVENOUS | Status: AC
  Filled 2024-06-25: qty 20

## 2024-06-25 MED ORDER — LACTATED RINGERS IV SOLN: INTRAVENOUS | Status: DC

## 2024-06-25 MED ORDER — DIPHENHYDRAMINE HCL 12.5 MG/5ML PO ELIX: 12.5000 mg | ORAL_SOLUTION | ORAL | Status: DC | PRN

## 2024-06-25 MED ORDER — 0.9 % SODIUM CHLORIDE (POUR BTL) OPTIME
TOPICAL | Status: DC | PRN
  Administered 2024-06-25: 1000 mL

## 2024-06-25 MED ORDER — ACETAMINOPHEN 500 MG PO TABS
1000.0000 mg | ORAL_TABLET | Freq: Once | ORAL | Status: AC
  Administered 2024-06-25: 1000 mg via ORAL
  Filled 2024-06-25: qty 2

## 2024-06-25 MED ORDER — MORPHINE SULFATE (PF) 2 MG/ML IV SOLN: 0.5000 mg | INTRAVENOUS | Status: DC | PRN

## 2024-06-25 MED ORDER — ONDANSETRON HCL 4 MG PO TABS: 4.0000 mg | ORAL_TABLET | Freq: Four times a day (QID) | ORAL | Status: DC | PRN

## 2024-06-25 MED ORDER — FENTANYL CITRATE (PF) 100 MCG/2ML IJ SOLN
INTRAMUSCULAR | Status: AC
  Filled 2024-06-25: qty 2

## 2024-06-25 MED ORDER — STERILE WATER FOR IRRIGATION IR SOLN
Status: DC | PRN
  Administered 2024-06-25: 2000 mL

## 2024-06-25 NOTE — Anesthesia Preprocedure Evaluation (Addendum)
 "                                  Anesthesia Evaluation  Patient identified by MRN, date of birth, ID band Patient awake    Reviewed: Allergy & Precautions, H&P , NPO status , Patient's Chart, lab work & pertinent test results  History of Anesthesia Complications (+) PONV and history of anesthetic complications  Airway Mallampati: II  TM Distance: >3 FB Neck ROM: Full    Dental no notable dental hx.    Pulmonary neg pulmonary ROS, neg sleep apnea   Pulmonary exam normal breath sounds clear to auscultation       Cardiovascular hypertension, + Peripheral Vascular Disease  Normal cardiovascular exam(-) dysrhythmias Atrial Fibrillation  Rhythm:Regular Rate:Normal  Mod MS, Severe TR  IMPRESSIONS     1. Left ventricular ejection fraction, by estimation, is 70 to 75%. The  left ventricle has hyperdynamic function. The left ventricle has no  regional wall motion abnormalities. There is mild left ventricular  hypertrophy. Left ventricular diastolic  parameters are indeterminate. Elevated left atrial pressure.   2. Right ventricular systolic function is normal. The right ventricular  size is normal. There is normal pulmonary artery systolic pressure.   3. The mitral valve is abnormal. No evidence of mitral valve  regurgitation. Moderate mitral stenosis. The mean mitral valve gradient is  6.0 mmHg. HR 90 bpm. Severe mitral annular calcification.   4. Tricuspid valve regurgitation is moderate to severe.   5. The aortic valve has an indeterminant number of cusps. There is  moderate calcification of the aortic valve. There is moderate thickening  of the aortic valve. Aortic valve regurgitation is trivial. Aortic valve  sclerosis is present, with no evidence  of aortic valve stenosis.   6. There is Moderate (Grade III) atheroma plaque.   7. The inferior vena cava is normal in size with greater than 50%  respiratory variability, suggesting right atrial pressure of 3 mmHg.    8. Limited echo in setting of new onset afib     Neuro/Psych neg Seizures PSYCHIATRIC DISORDERS Anxiety     negative neurological ROS     GI/Hepatic Neg liver ROS, hiatal hernia,GERD  ,,  Endo/Other  diabetes, Type 2    Renal/GU negative Renal ROS  negative genitourinary   Musculoskeletal  right proximal humerus fracture   Abdominal   Peds negative pediatric ROS (+)  Hematology negative hematology ROS (+)   Anesthesia Other Findings   Reproductive/Obstetrics negative OB ROS                              Anesthesia Physical Anesthesia Plan  ASA: 3  Anesthesia Plan: General and Regional   Post-op Pain Management: Regional block*   Induction: Intravenous  PONV Risk Score and Plan: 4 or greater and Ondansetron , Dexamethasone  and Treatment may vary due to age or medical condition  Airway Management Planned: Oral ETT  Additional Equipment: None  Intra-op Plan:   Post-operative Plan: Extubation in OR  Informed Consent: I have reviewed the patients History and Physical, chart, labs and discussed the procedure including the risks, benefits and alternatives for the proposed anesthesia with the patient or authorized representative who has indicated his/her understanding and acceptance.     Dental advisory given  Plan Discussed with: CRNA  Anesthesia Plan Comments:  Anesthesia Quick Evaluation  "

## 2024-06-25 NOTE — Anesthesia Procedure Notes (Signed)
 Anesthesia Regional Block: Interscalene brachial plexus block   Pre-Anesthetic Checklist: , timeout performed,  Correct Patient, Correct Site, Correct Laterality,  Correct Procedure, Correct Position, site marked,  Risks and benefits discussed,  Surgical consent,  Pre-op evaluation,  At surgeon's request and post-op pain management  Laterality: Right  Prep: chloraprep       Needles:  Injection technique: Single-shot  Needle Type: Echogenic Stimulator Needle     Needle Length: 9cm  Needle Gauge: 21     Additional Needles:   Procedures:,,,, ultrasound used (permanent image in chart),,    Narrative:  Start time: 06/25/2024 1:45 PM End time: 06/25/2024 1:50 PM Injection made incrementally with aspirations every 5 mL.  Performed by: Personally  Anesthesiologist: Erma Thom SAUNDERS, MD  Additional Notes: Discussed risks and benefits of the nerve block in detail, including but not limited vascular injury, permanent nerve damage and infection.   Patient tolerated the procedure well. Local anesthetic introduced in an incremental fashion under minimal resistance after negative aspirations. No paresthesias were elicited. After completion of the procedure, no acute issues were identified and patient continued to be monitored by RN.

## 2024-06-25 NOTE — Interval H&P Note (Signed)
 History and Physical Interval Note:  06/25/2024 2:18 PM  Melissa [name]  has presented today for surgery, with the diagnosis of closed displaced fracture of proximal end of right humerus.  The various methods of treatment have been discussed with the patient and family. After consideration of risks, benefits and other options for treatment, the patient has consented to  Procedures: ARTHROPLASTY, SHOULDER, TOTAL, REVERSE (Right) as a surgical intervention.  The patient's history has been reviewed, patient examined, no change in status, stable for surgery.  I have reviewed the patient's chart and labs.  Questions were answered to the patient's satisfaction.     Fonda SHAUNNA [place]

## 2024-06-25 NOTE — Anesthesia Procedure Notes (Signed)
 Procedure Name: Intubation Date/Time: 06/25/2024 2:45 PM  Performed by: Cena Epps, CRNAPre-anesthesia Checklist: Patient identified, Emergency Drugs available, Suction available and Patient being monitored Patient Re-evaluated:Patient Re-evaluated prior to induction Oxygen Delivery Method: Circle System Utilized Preoxygenation: Pre-oxygenation with 100% oxygen Induction Type: IV induction Ventilation: Mask ventilation without difficulty Laryngoscope Size: Mac and 3 Grade View: Grade I Tube type: Oral Tube size: 7.0 mm Number of attempts: 1 Airway Equipment and Method: Stylet and Oral airway Placement Confirmation: ETT inserted through vocal cords under direct vision, positive ETCO2 and breath sounds checked- equal and bilateral Secured at: 22 cm Tube secured with: Tape Dental Injury: Teeth and Oropharynx as per pre-operative assessment

## 2024-06-25 NOTE — Transfer of Care (Signed)
 Immediate Anesthesia Transfer of Care Note  Patient: Melissa [name]  Procedure(s) Performed: ARTHROPLASTY, SHOULDER, TOTAL, REVERSE (Right: Shoulder)  Patient Location: PACU  Anesthesia Type:General  Level of Consciousness: drowsy  Airway & Oxygen Therapy: Patient Spontanous Breathing and Patient connected to face mask oxygen  Post-op Assessment: Report given to RN and Post -op Vital signs reviewed and stable  Post vital signs: Reviewed and stable  Last Vitals:  Vitals Value Taken Time  BP 103/71 06/25/24 17:36  Temp    Pulse 81 06/25/24 17:43  Resp 27 06/25/24 17:43  SpO2 100 % 06/25/24 17:43  Vitals shown include unfiled device data.  Last Pain:  Vitals:   06/25/24 1104  TempSrc: Oral         Complications: No notable events documented.

## 2024-06-25 NOTE — Anesthesia Postprocedure Evaluation (Signed)
"   Anesthesia Post Note  Patient: Melissa [name]  Procedure(s) Performed: ARTHROPLASTY, SHOULDER, TOTAL, REVERSE (Right: Shoulder)     Patient location during evaluation: PACU Anesthesia Type: Regional and General Level of consciousness: awake and alert Pain management: pain level controlled Vital Signs Assessment: post-procedure vital signs reviewed and stable Respiratory status: spontaneous breathing, nonlabored ventilation, respiratory function stable and patient connected to nasal cannula oxygen Cardiovascular status: blood pressure returned to baseline and stable Postop Assessment: no apparent nausea or vomiting Anesthetic complications: no   No notable events documented.  Last Vitals:  Vitals:   06/25/24 1800 06/25/24 1806  BP: (!) 107/55 (!) 104/58  Pulse: 83 84  Resp: 19 19  Temp:    SpO2: 95% 96%    Last Pain:  Vitals:   06/25/24 1806  TempSrc:   PainSc: Asleep                 [name]      "

## 2024-06-25 NOTE — Op Note (Signed)
 06/25/2024  5:10 PM  PATIENT:  Melissa [name]    PRE-OPERATIVE DIAGNOSIS:  right proximal humerus fracture, 4 part  POST-OPERATIVE DIAGNOSIS:  Same  PROCEDURE:  Reverse Total Shoulder Arthroplasty for fracture  SURGEON:  Fonda SHAUNNA Olmsted, MD  PHYSICIAN ASSISTANT: Army Daring, PA-C, present and scrubbed throughout the case, critical for completion in a timely fashion, and for retraction, instrumentation, and closure.  ANESTHESIA:   General with interscalene block using Exparel   ESTIMATED BLOOD LOSS: 250 ml  UNIQUE ASPECTS OF THE CASE:  cuff was attached to the tuberosities and bone quality was poor.  The medial calcar was destroyed.  PREOPERATIVE INDICATIONS:  Melissa [name] is a  89 y.o. female with a diagnosis of closed displaced fracture of proximal end of right humerus who failed conservative measures and elected for surgical management.    The risks benefits and alternatives were discussed with the patient preoperatively including but not limited to the risks of infection, bleeding, nerve injury, cardiopulmonary complications, the need for revision surgery, dislocation, brachial plexus palsy, incomplete relief of pain, among others, and the patient was willing to proceed.  OPERATIVE IMPLANTS:   Implant Name: CEMENT BONE R 1X40 - [id number] Type: Cement Inv. Item: CEMENT BONE R 1X40 Serial No.:  Manufacturer: [employer](ORTH,TRAU,BIO,SG) Lot No.: [id number] LRB: Right No. Used: 1 Action: Implanted   Implant Name: BASEPLATE GLENOSPHERE 25 - [id number] Type: Plate Inv. Item: BASEPLATE GLENOSPHERE 25 Serial No.:  Manufacturer: [employer](ORTH,TRAU,BIO,SG) Lot No.: [id number] LRB: Right No. Used: 1 Action: Implanted   Implant Name: SCREW CENTRAL 6.5X20MM - [id number] Type: Screw Inv. Item: SCREW CENTRAL 6.5X20MM Serial No.:  Manufacturer: [employer](ORTH,TRAU,BIO,SG) Lot No.: [id number] LRB: Right No. Used: 1 Action: Implanted   Implant Name:  DINO BAL STD STRL - [id number] Type: Orthopedic Implant Inv. Item: GLENOID SPHERE STD STRL Serial No.:  Manufacturer: [employer](ORTH,TRAU,BIO,SG) Lot No.: [id number] LRB: Right No. Used: 1 Action: Implanted   Implant Name: AMPARO MAYOLA DOLPHIN [id number] - [id number] Type: Screw Inv. Item: SCREW LOCKING STRL [id number] Serial No.:  Manufacturer: [employer](ORTH,TRAU,BIO,SG) Lot No.: [id number] LRB: Right No. Used: 1 Action: Implanted   Implant Name: SCREW LOCKING NS 4.24FFK79FF - [id number] Type: Screw Inv. Item: SCREW LOCKING NS 4.24FFK79FF Serial No.:  Manufacturer: [employer](ORTH,TRAU,BIO,SG) Lot No.: [id number] LRB: Right No. Used: 1 Action: Implanted   Implant Name: STEM HUMERAL STRL [id number] - [id number] Type: Stem Inv. Item: STEM HUMERAL STRL [id number] Serial No.:  Manufacturer: [employer](ORTH,TRAU,BIO,SG) Lot No.: [id number] LRB: Right No. Used: 1 Action: Implanted   Implant Name: TRAY HUM MINI SHOULDER +3 40 - [id number] Type: Joint Inv. Item: TRAY HUM MINI SHOULDER +3 40 Serial No.:  Manufacturer: [employer](ORTH,TRAU,BIO,SG) Lot No.: [id number] LRB: Right No. Used: 1 Action: Implanted   Implant Name: TRAY HUM REV SHOULDER 36 +3 - [id number] Type: Shoulder Inv. Item: TRAY HUM REV SHOULDER 36 +3 Serial No.:  Manufacturer: [employer](ORTH,TRAU,BIO,SG) Lot No.: [id number] LRB: Right No. Used: 1 Action: Implanted   OPERATIVE FINDINGS: displaced crushed humerus fracture.  OPERATIVE PROCEDURE: The patient was brought to the operating room and placed in the supine position. General anesthesia was administered. IV antibiotics were given.  Time out was performed. The upper extremity was prepped and draped in usual sterile fashion. The patient was in a beachchair position. Deltopectoral approach was carried out. The biceps was tenodesed to the pectoralis tendon with #5 Maxbraid. The subscapularis was released off of the bone.    Tuberosities were mobilized and sutured.  Deep  retractors were placed, and I resected the labrum, and then placed a guidepin into the center position on the glenoid, with slight inferior inclination. I then reamed over the guidepin, and this created a small metaphyseal cancellus blush inferiorly, removing just the cartilage to the subchondral bone superiorly. The base plate was selected and impacted place, and then I secured it centrally with a nonlocking screw, and I had excellent purchase both inferiorly and superiorly. I had 100% contact with a solid central screw so I only used 2 peripheral screws.  I then turned my attention to the glenosphere, and impacted this into place, placing slight inferior offset (set on B).   The glenosphere was completely seated, and had engagement of the [facility] taper. I then turned my attention back to the humerus.  I sequentially reamed and selected an 8, reaming almost to a 10, with a cemented technique. . I trialed and found the above named components had optimal stability and soft tissue tension.  The shoulder felt stable throughout functional motion.  Before I placed the real prosthesis I had also placed a total of 2 #5 Maxbraid through the humerus for later around the [place] repair.  I then placed the real humeral tray, and reduced the shoulder. The shoulder had excellent motion, and was stable, and I irrigated the wounds copiously.    I then repaired the tuberosities passing the sutures through the prosthesis, and also placed bone graft from the head, and the tuberosities closed nicely.  This came down to bone.  I then irrigated the shoulder copiously once more, repaired the deltopectoral interval with Vicryl followed by subcutaneous Vicryl with Steri-Strips and sterile gauze for the skin. The patient was awakened and returned back in stable and satisfactory condition. There were no complications and She tolerated the procedure well.

## 2024-06-26 ENCOUNTER — Encounter (HOSPITAL_COMMUNITY): Payer: Self-pay | Admitting: Orthopedic Surgery

## 2024-06-26 LAB — GLUCOSE, CAPILLARY
Glucose-Capillary: 128 mg/dL — ABNORMAL HIGH (ref 70–99)
Glucose-Capillary: 128 mg/dL — ABNORMAL HIGH (ref 70–99)
Glucose-Capillary: 132 mg/dL — ABNORMAL HIGH (ref 70–99)
Glucose-Capillary: 143 mg/dL — ABNORMAL HIGH (ref 70–99)
Glucose-Capillary: 270 mg/dL — ABNORMAL HIGH (ref 70–99)

## 2024-06-26 LAB — BASIC METABOLIC PANEL WITH GFR
Anion gap: 12 (ref 5–15)
BUN: 18 mg/dL (ref 8–23)
CO2: 25 mmol/L (ref 22–32)
Calcium: 7.8 mg/dL — ABNORMAL LOW (ref 8.9–10.3)
Chloride: 103 mmol/L (ref 98–111)
Creatinine, Ser: 0.93 mg/dL (ref 0.44–1.00)
GFR, Estimated: 59 mL/min — ABNORMAL LOW
Glucose, Bld: 221 mg/dL — ABNORMAL HIGH (ref 70–99)
Potassium: 4.6 mmol/L (ref 3.5–5.1)
Sodium: 139 mmol/L (ref 135–145)

## 2024-06-26 LAB — CBC
HCT: 29.4 % — ABNORMAL LOW (ref 36.0–46.0)
Hemoglobin: 9.5 g/dL — ABNORMAL LOW (ref 12.0–15.0)
MCH: 31.4 pg (ref 26.0–34.0)
MCHC: 32.3 g/dL (ref 30.0–36.0)
MCV: 97 fL (ref 80.0–100.0)
Platelets: 355 K/uL (ref 150–400)
RBC: 3.03 MIL/uL — ABNORMAL LOW (ref 3.87–5.11)
RDW: 14.2 % (ref 11.5–15.5)
WBC: 8.5 K/uL (ref 4.0–10.5)
nRBC: 0 % (ref 0.0–0.2)

## 2024-06-26 NOTE — Evaluation (Signed)
 Physical Therapy Evaluation Patient Details Name: Melissa [name] MRN: [id number] DOB: 07-13-1934 Today's Date: 06/26/2024  History of Present Illness  Patient is an 89 yo female presenting to the hospital for a R reverse total shoulder on 06/25/24. PMH: diabetes, GERD, recent PE on eliquis , afib, right bundle branch block  Clinical Impression  Pt admitted with above diagnosis. PTA, pt reports ind with intermittent SPC in R hand, endorses falls, reports ind with ADLs/IADLs, drives. On eval, pt with generalized weakness, needing min A for transfers and ambulation with SPC, noted to drift R/L with eye gaze, decreased cadence and minimal bil foot clearance. Pt with dyspnea when amb in hallway and after toileting, on RA with Spo2 89%, cued pt for pursed lip breathing and rest with SpO2 92% within ~2 minutes of seated rest. Pt reports feeling in [place] when asked about dizziness and lightheadedness. Pt's BP 132/70 once back supine in bed. Notified RN. Patient will benefit from continued inpatient follow up therapy, <3 hours/day. Pt currently with functional limitations due to the deficits listed below (see PT Problem List). Pt will benefit from acute skilled PT to increase their independence and safety with mobility to allow discharge.           If plan is discharge home, recommend the following: A little help with walking and/or transfers;A little help with bathing/dressing/bathroom;Assistance with cooking/housework;Assist for transportation;Help with stairs or ramp for entrance   Can travel by private vehicle   Yes    Equipment Recommendations None recommended by PT  Recommendations for Other Services       Functional Status Assessment Patient has had a recent decline in their functional status and demonstrates the ability to make significant improvements in function in a reasonable and predictable amount of time.     Precautions / Restrictions Precautions Precautions:  Shoulder;Fall Type of Shoulder Precautions: No AROM No PROM, NWB RUE, AROM elbow, wrist, and hand permitted, sling at all times Shoulder Interventions: Shoulder sling/immobilizer;At all times Precaution Booklet Issued: Yes (comment) Recall of Precautions/Restrictions: Intact Required Braces or Orthoses: Sling Restrictions Weight Bearing Restrictions Per Provider Order: Yes RUE Weight Bearing Per Provider Order: Non weight bearing      Mobility  Bed Mobility Overal bed mobility: Modified Independent             General bed mobility comments: use of bedrail, no physical assist    Transfers Overall transfer level: Needs assistance Equipment used: 1 person hand held assist Transfers: Sit to/from Stand Sit to Stand: Min assist           General transfer comment: Min A for safety    Ambulation/Gait Ambulation/Gait assistance: Min assist Gait Distance (Feet): 80 Feet Assistive device: Straight cane Gait Pattern/deviations: Step-through pattern, Decreased stride length, Drifts right/left Gait velocity: decreased     General Gait Details: step through gait pattern with low bil foot clearance, increased time and steps to navigate obstacles in hallway and turns, with head turns R/L and up/down pt with drifting R/L and decreased cadence, min A for safety  Stairs            Wheelchair Mobility     Tilt Bed    Modified Rankin (Stroke Patients Only)       Balance Overall balance assessment: Mild deficits observed, not formally tested  Pertinent Vitals/Pain Pain Assessment Pain Assessment: 0-10 Pain Score: 2  Pain Location: shoudler Pain Descriptors / Indicators: Discomfort Pain Intervention(s): Limited activity within patient's tolerance, Monitored during session, Repositioned, Ice applied    Home Living Family/patient expects to be discharged to:: Private residence Living Arrangements:  Alone Available Help at Discharge: Available PRN/intermittently;Family;Neighbor Type of Home: House Home Access: Ramped entrance       Home Layout: One level Home Equipment: Cane - Programmer, Applications (2 wheels);Shower seat;Grab bars - toilet;Grab bars - tub/shower;Shower seat - built in      Prior Function Prior Level of Function : Independent/Modified Independent;Driving             Mobility Comments: very independent typically, occasionally uses a cane in R hand, endorses a fall ADLs Comments: independent, driving, fully intact cognitively     Extremity/Trunk Assessment   Upper Extremity Assessment Upper Extremity Assessment: Defer to OT evaluation    Lower Extremity Assessment Lower Extremity Assessment: Generalized weakness (AROM WFL, strength grossly 3+/5, denies numbness/tingling throughout)    Cervical / Trunk Assessment Cervical / Trunk Assessment: Kyphotic (minimally)  Communication   Communication Communication: No apparent difficulties Factors Affecting Communication: Hearing impaired    Cognition Arousal: Alert Behavior During Therapy: WFL for tasks assessed/performed   PT - Cognitive impairments: No apparent impairments                         Following commands: Intact       Cueing Cueing Techniques: Verbal cues     General Comments General comments (skin integrity, edema, etc.): Pt on RA throughout, post amb and toileting pt with increased dyspnea and SpO2 89% and HR max noted 117, cued for pursed lip  breathign and rest and improves to 92% and HR 105. Pt reports feeling in fairy [place] when dyspnea noted. BP 132/70 once supine in bed. Notified RN.    Exercises     Assessment/Plan    PT Assessment Patient needs continued PT services  PT Problem List Decreased strength;Decreased activity tolerance;Decreased balance;Decreased mobility;Decreased range of motion;Decreased safety awareness;Decreased knowledge of  precautions;Pain;Cardiopulmonary status limiting activity       PT Treatment Interventions DME instruction;Gait training;Functional mobility training;Therapeutic activities;Therapeutic exercise;Balance training;Patient/family education    PT Goals (Current goals can be found in the Care Plan section)  Acute Rehab PT Goals Patient Stated Goal: regain independence PT Goal Formulation: With patient Time For Goal Achievement: 07/10/24 Potential to Achieve Goals: Good    Frequency Min 2X/week     Co-evaluation               AM-PAC PT 6 Clicks Mobility  Outcome Measure Help needed turning from your back to your side while in a flat bed without using bedrails?: A Little Help needed moving from lying on your back to sitting on the side of a flat bed without using bedrails?: A Little Help needed moving to and from a bed to a chair (including a wheelchair)?: A Little Help needed standing up from a chair using your arms (e.g., wheelchair or bedside chair)?: A Little Help needed to walk in hospital room?: A Little Help needed climbing 3-5 steps with a railing? : A Lot 6 Click Score: 17    End of Session Equipment Utilized During Treatment: Gait belt Activity Tolerance: Patient tolerated treatment well;Other (comment) (shortness of breath) Patient left: in bed;with call bell/phone within reach;with bed alarm set Nurse Communication: Mobility status;Other (comment) (dyspnea, SpO2) PT Visit  Diagnosis: Unsteadiness on feet (R26.81);Muscle weakness (generalized) (M62.81);Pain Pain - Right/Left: Right Pain - part of body: Shoulder    Time: 8557-8484 PT Time Calculation (min) (ACUTE ONLY): 33 min   Charges:   PT Evaluation $PT Eval Low Complexity: 1 Low PT Treatments $Gait Training: 8-22 mins PT General Charges $$ ACUTE PT VISIT: 1 Visit         Tori Nuala Chiles PT, DPT 06/26/2024, 3:55 PM

## 2024-06-26 NOTE — NC FL2 (Signed)
 " [place]  MEDICAID FL2 LEVEL OF CARE FORM     IDENTIFICATION  Patient Name: Melissa [name] Birthdate: Jan 12, 1935 Sex: female Admission Date (Current Location): 06/25/2024  [facility] and [place] Number:  Producer, Television/film/video and Address:  [facility],  [address] [place], [place] [address]      Provider Number: [id number]  Attending Physician Name and Address:  Josefina Chew, MD  Relative Name and Phone Number:  daughter, Greig Patient @ [phone]    Current Level of Care: SNF Recommended Level of Care: Skilled Nursing Facility Prior Approval Number:    Date Approved/Denied:   PASRR Number: [id number]  Discharge Plan: SNF    Current Diagnoses: Patient Active Problem List   Diagnosis Date Noted   S/P reverse total shoulder arthroplasty, right 06/25/2024   Hypomagnesemia 05/20/2023   New onset a-fib (HCC) 05/19/2023   At risk for falls 05/09/2023   Essential hypertension 02/26/2023   Normocytic anemia 02/26/2023   Hx pulmonary embolism 02/25/2023   Lumbar degenerative disc disease 01/27/2023   Retrolisthesis of vertebrae-L2 on L3 approximately 7-8 mm 01/27/2023   Callus 10/21/2020   Early stage nonexudative age-related macular degeneration of left eye 04/13/2020   Advanced nonexudative age-related macular degeneration of right eye with subfoveal involvement 04/01/2020   Degenerative retinal drusen of left eye 04/01/2020   Pseudophakia, both eyes 04/01/2020   Posterior vitreous detachment of left eye 04/01/2020   Anxiety 03/05/2019   Chronic bilateral thoracic back pain 08/03/2018   Iron deficiency anemia 07/04/2018   Peripheral arterial disease 07/11/2017   Varicose veins of both lower extremities 08/31/2016   Macular degeneration 05/05/2015   Hiatal hernia 01/28/2015   Mild nonproliferative retinopathy of both eyes with macular edema associated with type 2 diabetes mellitus (HCC) 10/02/2013   Type 2 diabetes mellitus (HCC) 03/24/2009   GERD 03/24/2009     Orientation RESPIRATION BLADDER Height & Weight     Self, Time, Situation, Place  Normal Continent Weight: 115 lb 12.8 oz (52.5 kg) Height:  5' 1 (154.9 cm)  BEHAVIORAL SYMPTOMS/MOOD NEUROLOGICAL BOWEL NUTRITION STATUS      Continent Diet (regular)  AMBULATORY STATUS COMMUNICATION OF NEEDS Skin   Limited Assist   Other (Comment) (surgical incision only)                       Personal Care Assistance Level of Assistance  Bathing, Feeding, Dressing Bathing Assistance: Limited assistance Feeding assistance: Limited assistance Dressing Assistance: Limited assistance     Functional Limitations Info  Sight, Hearing, Speech Sight Info: Adequate Hearing Info: Adequate Speech Info: Adequate    SPECIAL CARE FACTORS FREQUENCY  PT (By licensed PT), OT (By licensed OT)     PT Frequency: 5x/wk OT Frequency: 5x/wk            Contractures Contractures Info: Not present    Additional Factors Info  Code Status, Allergies Code Status Info: Full Allergies Info: NKDA           Current Medications (06/26/2024):  This is the current hospital active medication list Current Facility-Administered Medications  Medication Dose Route Frequency Provider Last Rate Last Admin   acetaminophen  (TYLENOL ) tablet 325-650 mg  325-650 mg Oral Q6H PRN Brown, Blaine K, PA-C       acetaminophen  (TYLENOL ) tablet 500 mg  500 mg Oral Q6H Brown, Blaine K, PA-C   500 mg at 06/26/24 1243   alum & mag hydroxide-simeth (MAALOX/MYLANTA) 200-200-20 MG/5ML suspension 30 mL  30  mL Oral Q4H PRN Brown, Blaine K, PA-C       apixaban  (ELIQUIS ) tablet 2.5 mg  2.5 mg Oral BID Brown, Blaine K, PA-C   2.5 mg at 06/26/24 9177   atorvastatin  (LIPITOR) tablet 40 mg  40 mg Oral Daily Brown, Blaine K, PA-C   40 mg at 06/26/24 9177   bisacodyl  (DULCOLAX) suppository 10 mg  10 mg Rectal Daily PRN Brown, Blaine K, PA-C       diphenhydrAMINE  (BENADRYL ) 12.5 MG/5ML elixir 12.5-25 mg  12.5-25 mg Oral Q4H PRN Brown, Blaine  K, PA-C       docusate sodium  (COLACE) capsule 100 mg  100 mg Oral BID Brown, Blaine K, PA-C   100 mg at 06/26/24 9178   escitalopram  (LEXAPRO ) tablet 5 mg  5 mg Oral Daily Brown, Blaine K, PA-C   5 mg at 06/26/24 9178   gabapentin  (NEURONTIN ) capsule 600 mg  600 mg Oral QHS Brown, Blaine K, PA-C       HYDROcodone -acetaminophen  (NORCO) 7.5-325 MG per tablet 1-2 tablet  1-2 tablet Oral Q4H PRN Brown, Blaine K, PA-C       HYDROcodone -acetaminophen  (NORCO/VICODIN) 5-325 MG per tablet 1-2 tablet  1-2 tablet Oral Q4H PRN Brown, Blaine K, PA-C       insulin  aspart (novoLOG ) injection 0-15 Units  0-15 Units Subcutaneous TID WC Brown, Blaine K, PA-C   2 Units at 06/26/24 1307   magnesium  citrate solution 1 Bottle  1 Bottle Oral Once PRN Brown, Blaine K, PA-C       menthol  (CEPACOL) lozenge 3 mg  1 lozenge Oral PRN Brown, Blaine K, PA-C       Or   phenol (CHLORASEPTIC) mouth spray 1 spray  1 spray Mouth/Throat PRN Brown, Blaine K, PA-C       metFORMIN  (GLUCOPHAGE ) tablet 500 mg  500 mg Oral Q breakfast Brown, Blaine K, PA-C   500 mg at 06/26/24 9178   metoCLOPramide  (REGLAN ) tablet 5-10 mg  5-10 mg Oral Q8H PRN Brown, Blaine K, PA-C       Or   metoCLOPramide  (REGLAN ) injection 5-10 mg  5-10 mg Intravenous Q8H PRN Delores, Blaine K, PA-C       metoprolol  succinate (TOPROL -XL) 24 hr tablet 50 mg  50 mg Oral Daily Brown, Blaine K, PA-C   50 mg at 06/26/24 9177   morphine  (PF) 2 MG/ML injection 0.5-1 mg  0.5-1 mg Intravenous Q2H PRN Brown, Blaine K, PA-C       ondansetron  (ZOFRAN ) tablet 4 mg  4 mg Oral Q6H PRN Brown, Blaine K, PA-C       Or   ondansetron  (ZOFRAN ) injection 4 mg  4 mg Intravenous Q6H PRN Brown, Blaine K, PA-C   4 mg at 06/26/24 1325   pantoprazole  (PROTONIX ) EC tablet 40 mg  40 mg Oral Daily Brown, Blaine K, PA-C   40 mg at 06/26/24 9177   polyethylene glycol (MIRALAX  / GLYCOLAX ) packet 17 g  17 g Oral Daily PRN Brown, Blaine K, PA-C         Discharge Medications: Please see discharge  summary for a list of discharge medications.  Relevant Imaging Results:  Relevant Lab Results:   Additional Information    Anice Wilshire, LCSW     "

## 2024-06-26 NOTE — Progress Notes (Signed)
 "    Subjective: 1 Day Post-Op s/p Procedures: ARTHROPLASTY, SHOULDER, TOTAL, REVERSE   Patient is alert, oriented. Sitting up in bed. Block still in place. Has had some coughing and planning to work on incentive spirometry this morning.  Denies chest pain, SOB. No nausea/vomiting.  No other complaints.  Objective:  PE: VITALS:   Vitals:   06/25/24 2203 06/26/24 0119 06/26/24 0525 06/26/24 0820  BP: 136/82 (!) 108/56 (!) 112/57 (!) 101/59  Pulse: 90 84 82 89  Resp: 19 18 18 18   Temp: 97.7 F (36.5 C) 97.6 F (36.4 C) 97.6 F (36.4 C) 97.6 F (36.4 C)  TempSrc: Oral Oral Oral Oral  SpO2: 98% 100% 100% 95%  Weight:      Height:       General: sitting up in bed, in no acute distress MSK: RUE in sling. Block still in place, able to perform a small amount of flexion in all fingers except thumb, no thumb movement yet. No sensation yet to right arm. Incision CDI.   LABS  Results for orders placed or performed during the hospital encounter of 06/25/24 (from the past 24 hours)  Glucose, capillary     Status: None   Collection Time: 06/25/24 11:14 AM  Result Value Ref Range   Glucose-Capillary 80 70 - 99 mg/dL  Basic metabolic panel per protocol     Status: Abnormal   Collection Time: 06/25/24 11:23 AM  Result Value Ref Range   Sodium 139 135 - 145 mmol/L   Potassium 5.2 (H) 3.5 - 5.1 mmol/L   Chloride 105 98 - 111 mmol/L   CO2 18 (L) 22 - 32 mmol/L   Glucose, Bld 89 70 - 99 mg/dL   BUN 19 8 - 23 mg/dL   Creatinine, Ser 9.22 0.44 - 1.00 mg/dL   Calcium  8.1 (L) 8.9 - 10.3 mg/dL   GFR, Estimated >39 >39 mL/min   Anion gap 16 (H) 5 - 15  Surgical pcr screen     Status: None   Collection Time: 06/25/24 11:24 AM   Specimen: Nasal Mucosa; Nasal Swab  Result Value Ref Range   MRSA, PCR NEGATIVE NEGATIVE   Staphylococcus aureus NEGATIVE NEGATIVE  Hemoglobin A1c per protocol     Status: Abnormal   Collection Time: 06/25/24  7:04 PM  Result Value Ref Range   Hgb A1c MFr Bld  6.3 (H) 4.8 - 5.6 %   Mean Plasma Glucose 134.11 mg/dL  Glucose, capillary     Status: Abnormal   Collection Time: 06/25/24  7:33 PM  Result Value Ref Range   Glucose-Capillary 100 (H) 70 - 99 mg/dL  Glucose, capillary     Status: Abnormal   Collection Time: 06/26/24 12:36 AM  Result Value Ref Range   Glucose-Capillary 270 (H) 70 - 99 mg/dL  CBC     Status: Abnormal   Collection Time: 06/26/24  3:17 AM  Result Value Ref Range   WBC 8.5 4.0 - 10.5 K/uL   RBC 3.03 (L) 3.87 - 5.11 MIL/uL   Hemoglobin 9.5 (L) 12.0 - 15.0 g/dL   HCT 70.5 (L) 63.9 - 53.9 %   MCV 97.0 80.0 - 100.0 fL   MCH 31.4 26.0 - 34.0 pg   MCHC 32.3 30.0 - 36.0 g/dL   RDW 85.7 88.4 - 84.4 %   Platelets 355 150 - 400 K/uL   nRBC 0.0 0.0 - 0.2 %  Basic metabolic panel     Status: Abnormal   Collection Time:  06/26/24  3:17 AM  Result Value Ref Range   Sodium 139 135 - 145 mmol/L   Potassium 4.6 3.5 - 5.1 mmol/L   Chloride 103 98 - 111 mmol/L   CO2 25 22 - 32 mmol/L   Glucose, Bld 221 (H) 70 - 99 mg/dL   BUN 18 8 - 23 mg/dL   Creatinine, Ser 9.06 0.44 - 1.00 mg/dL   Calcium  7.8 (L) 8.9 - 10.3 mg/dL   GFR, Estimated 59 (L) >60 mL/min   Anion gap 12 5 - 15  Glucose, capillary     Status: Abnormal   Collection Time: 06/26/24  7:48 AM  Result Value Ref Range   Glucose-Capillary 128 (H) 70 - 99 mg/dL    DG Shoulder Right Port Result Date: 06/25/2024 EXAM: 1 VIEW XRAY OF THE RIGHT SHOULDER 06/25/2024 06:02:00 PM COMPARISON: 06/08/2024. CLINICAL HISTORY: S/P reverse total shoulder arthroplasty, right. FINDINGS: BONES AND JOINTS: Interval Right reverse total shoulder arthroplasty noted. Acute mildly comminuted fracture of the lateral aspect of the proximal humeral diaphysis adjacent to the arthroplasty hardware. Residual fracture fragment lateral to the arthroplasty. The [facility] joint is unremarkable. SOFT TISSUES: Subcutaneous emphysema about the right shoulder joint. No abnormal calcifications. Visualized lung is  unremarkable. IMPRESSION: 1. Interval right shoulder reverse arthroplasty with residual fracture fragments and fracture line demonstrated adjacent to the humeral component. 2. Subcutaneous emphysema about the right shoulder joint. Electronically signed by: Elsie Gravely MD 06/25/2024 10:22 PM EST RP Workstation: [id number]    Today's  total administered Morphine  Milligram Equivalents: 0 Yesterday's total administered Morphine  Milligram Equivalents: 45  Assessment/Plan: Principal Problem:   S/P reverse total shoulder arthroplasty, right  1 Day Post-Op s/p Procedures: ARTHROPLASTY, SHOULDER, TOTAL, REVERSE - ABLA Hbg 9.5 this morning. Will restart home 2.5 mg Eliquis  this morning. On Eliquis  due to history of PE. Will continue to watch.   Weightbearing: NWB RUE Insicional and dressing care: Reinforce dressings as needed Orthopedic device(s): Sling at all times Pain control: block still in place, continue current regimen Follow - up plan: 2 weeks with Dr. Josefina Dispo: pending OT eval this morning. Patient states that her family is pursuing SNF placement due to her living at home with limited support.They specifically are interested in [facility], will place SNF placement order with [facility] team.    Contact information:   Army Daring, PA-C Weekdays 8-5  After hours and holidays please check [email or web address] for group call information for [facility]  Army MARLA Daring 06/26/2024, 8:50 AM "

## 2024-06-26 NOTE — Care Management Obs Status (Signed)
 MEDICARE OBSERVATION STATUS NOTIFICATION   Patient Details  Name: Melissa [name] MRN: [id number] Date of Birth: Nov 13, 1934   Medicare Observation Status Notification Given:  Yes  Pt not comfortable signing notice.   NORMAN ASPEN, LCSW 06/26/2024, 4:32 PM

## 2024-06-26 NOTE — Evaluation (Signed)
 Occupational Therapy Evaluation Patient Details Name: Melissa [name] MRN: [id number] DOB: 05-May-1935 Today's Date: 06/26/2024   History of Present Illness   Patient is an 89 yo female presenting to the hospital for a R reverse total shoulder on 06/25/24. PMH: diabetes, GERD, recent PE on eliquis      Clinical Impressions Prior to this admission, patient living alone, fully independent, and occasionally uses a cane or walker when needed. Patient still drives and manages all of her IADLs. Patient does endorse falls. Currently, patient with no pain in R shoulder, however cannot actively move elbow or wrist. Patient can flex and extend digits. Shoulder education provided with handouts also placed in patient's room. Patient mod I for bed mobility, min A for ambulation (one person HHA) and mod A for ADL management due to precautions. OT will continue to follow acutely, and recommending rehab of a lesser intensity < 3 hours prior to discharge home.      If plan is discharge home, recommend the following:   A little help with walking and/or transfers;A lot of help with bathing/dressing/bathroom;Assistance with cooking/housework;Assist for transportation     Functional Status Assessment   Patient has had a recent decline in their functional status and demonstrates the ability to make significant improvements in function in a reasonable and predictable amount of time.     Equipment Recommendations   Other (comment) (defer to next venue)     Recommendations for Other Services         Precautions/Restrictions   Precautions Precautions: Shoulder;Fall Type of Shoulder Precautions: No AROM No PROM, NWB RUE, AROM elbow, wrist, and hand permitted, sling at all times Shoulder Interventions: Shoulder sling/immobilizer;At all times Precaution Booklet Issued: Yes (comment) Recall of Precautions/Restrictions: Intact Required Braces or Orthoses: Sling Restrictions Weight Bearing  Restrictions Per Provider Order: Yes RUE Weight Bearing Per Provider Order: Non weight bearing     Mobility Bed Mobility Overal bed mobility: Modified Independent                  Transfers Overall transfer level: Needs assistance Equipment used: 1 person hand held assist Transfers: Sit to/from Stand Sit to Stand: Min assist           General transfer comment: Min A for safety      Balance Overall balance assessment: Mild deficits observed, not formally tested                                         ADL either performed or assessed with clinical judgement   ADL Overall ADL's : Needs assistance/impaired Eating/Feeding: Minimal assistance;Sitting   Grooming: Minimal assistance;Sitting   Upper Body Bathing: Maximal assistance;Sitting   Lower Body Bathing: Maximal assistance;Sitting/lateral leans;Sit to/from stand   Upper Body Dressing : Maximal assistance;Sitting;Cueing for UE precautions;Cueing for compensatory techniques;Cueing for sequencing   Lower Body Dressing: Maximal assistance;Sitting/lateral leans;Sit to/from stand   Toilet Transfer: Minimal assistance;Ambulation   Toileting- Clothing Manipulation and Hygiene: Moderate assistance;Sit to/from stand;Sitting/lateral lean       Functional mobility during ADLs: Moderate assistance;Cueing for safety;Cueing for sequencing General ADL Comments: Prior to this admission, patient living alone, fully independent, and occasionally uses a cane or walker when needed. Patient still drives and manages all of her IADLs. Patient does endorse falls. Currently, patient with no pain in R shoulder, however cannot actively move elbow or wrist. Patient can flex and extend digits.  Shoulder education provided with handouts also placed in patient's room. Patient mod I for bed mobility, min A for ambulation (one person HHA) and mod A for ADL management due to precautions. OT will continue to follow acutely, and  recommending rehab of a lesser intensity < 3 hours prior to discharge home.     Vision Baseline Vision/History: 0 No visual deficits Ability to See in Adequate Light: 0 Adequate Patient Visual Report: No change from baseline Vision Assessment?: No apparent visual deficits     Perception Perception: Within Functional Limits       Praxis Praxis: WFL       Pertinent Vitals/Pain Pain Assessment Pain Assessment: No/denies pain     Extremity/Trunk Assessment Upper Extremity Assessment Upper Extremity Assessment: RUE deficits/detail;Right hand dominant RUE Deficits / Details: RUE in sling, no difference in sensation, cannot currently actively move elbow and wrist, can flex and extend digits, swelling noted throughout RUE: Unable to fully assess due to pain;Unable to fully assess due to immobilization RUE Sensation: WNL RUE Coordination: decreased fine motor;decreased gross motor   Lower Extremity Assessment Lower Extremity Assessment: Defer to PT evaluation   Cervical / Trunk Assessment Cervical / Trunk Assessment: Kyphotic (minimally)   Communication Communication Communication: No apparent difficulties Factors Affecting Communication: Hearing impaired   Cognition Arousal: Alert Behavior During Therapy: WFL for tasks assessed/performed Cognition: No apparent impairments                               Following commands: Intact       Cueing  General Comments   Cueing Techniques: Verbal cues  VSS on RA   Exercises     Shoulder Instructions      Home Living Family/patient expects to be discharged to:: Private residence Living Arrangements: Alone Available Help at Discharge: Available PRN/intermittently;Family;Neighbor Type of Home: House Home Access: Ramped entrance     Home Layout: One level     Bathroom Shower/Tub: Tub/shower unit;Walk-in shower   Bathroom Toilet: Standard     Home Equipment: Cane - Programmer, [employer] (2 wheels);Shower  seat;Grab bars - toilet;Grab bars - tub/shower;Shower seat - built in          Prior Functioning/Environment Prior Level of Function : Independent/Modified Independent;Driving             Mobility Comments: very independent typically, occasionally uses a cane ADLs Comments: independent, driving, fully intact cognitively    OT Problem List: Decreased strength;Decreased range of motion;Decreased activity tolerance;Impaired balance (sitting and/or standing);Decreased coordination;Decreased knowledge of precautions;Impaired UE functional use;Increased edema   OT Treatment/Interventions: Self-care/ADL training;Therapeutic exercise;Energy conservation;DME and/or AE instruction;Manual therapy;Therapeutic activities;Modalities;Patient/family education;Balance training      OT Goals(Current goals can be found in the care plan section)   Acute Rehab OT Goals Patient Stated Goal: to get better OT Goal Formulation: With patient Time For Goal Achievement: 07/10/24 Potential to Achieve Goals: Good   OT Frequency:  Min 2X/week    Co-evaluation              AM-PAC OT 6 Clicks Daily Activity     Outcome Measure Help from another person eating meals?: A Little Help from another person taking care of personal grooming?: A Little Help from another person toileting, which includes using toliet, bedpan, or urinal?: A Lot Help from another person bathing (including washing, rinsing, drying)?: A Lot Help from another person to put on and taking off regular upper body clothing?: A  Little Help from another person to put on and taking off regular lower body clothing?: A Lot 6 Click Score: 15   End of Session Equipment Utilized During Treatment:  (Sling) Nurse Communication: Mobility status  Activity Tolerance: Patient tolerated treatment well Patient left: in chair;with call bell/phone within reach;with chair alarm set  OT Visit Diagnosis: Unsteadiness on feet (R26.81);Repeated falls  (R29.6);Muscle weakness (generalized) (M62.81);History of falling (Z91.81)                Time: 9058-8987 OT Time Calculation (min): 31 min Charges:  OT General Charges $OT Visit: 1 Visit OT Evaluation $OT Eval Moderate Complexity: 1 Mod OT Treatments $Self Care/Home Management : 8-22 mins  Ronal Gift E. Maimuna Leaman, OTR/L Acute Rehabilitation Services [phone]   Ronal Gift Salt 06/26/2024, 11:31 AM

## 2024-06-26 NOTE — TOC Initial Note (Signed)
 Transition of Care [facility]) - Initial/Assessment Note    Patient Details  Name: Melissa [name] MRN: [id number] Date of Birth: 1935-04-28  [facility] CM/SW Contact:    NORMAN ASPEN, LCSW Phone Number: 06/26/2024, 4:42 PM  Clinical Narrative:                  Met with pt to introduce CSW role with dc planning.  Pt confirms that she lives alone with most family out of town/ state.  She and family are concerned about return home given current functional limitations.  Per therapy notes, they have recommended short term SNF stay and pt in agreement with this plan.  Explained need for insurance authorization of SNF.  Will reach out to preferred SNF ([facility]) to see if they can offer or will need to widen bed search.    Expected Discharge Plan: Skilled Nursing Facility Barriers to Discharge: Insurance Authorization, SNF Pending bed offer   Patient Goals and CMS Choice Patient states their goals for this hospitalization and ongoing recovery are:: return home following SNF rehab          Expected Discharge Plan and Services In-house Referral: Clinical Social Work   Post Acute Care Choice: Skilled Nursing Facility Living arrangements for the past 2 months: Single Family Home                                      Prior Living Arrangements/Services Living arrangements for the past 2 months: Single Family Home Lives with:: Self Patient language and need for interpreter reviewed:: Yes Do you feel safe going back to the place where you live?: Yes      Need for Family Participation in Patient Care: Yes (Comment) Care giver support system in place?: No (comment)   Criminal Activity/Legal Involvement Pertinent to Current Situation/Hospitalization: No - Comment as needed  Activities of Daily Living   ADL Screening (condition at time of admission) Independently performs ADLs?: Yes (appropriate for developmental age) Is the patient deaf or have difficulty hearing?:  No Does the patient have difficulty seeing, even when wearing glasses/contacts?: No Does the patient have difficulty concentrating, remembering, or making decisions?: No  Permission Sought/Granted Permission sought to share information with : Family Supports Permission granted to share information with : Yes, Verbal Permission Granted  Share Information with NAME: any of chidren on contact list           Emotional Assessment Appearance:: Appears stated age Attitude/Demeanor/Rapport: Gracious Affect (typically observed): Accepting Orientation: : Oriented to Self, Oriented to Place, Oriented to  Time, Oriented to Situation Alcohol / Substance Use: Not Applicable Psych Involvement: No (comment)  Admission diagnosis:  Other closed displaced fracture of proximal end of right humerus, initial encounter [S42.291A] S/P reverse total shoulder arthroplasty, right [Z96.611] Patient Active Problem List   Diagnosis Date Noted   S/P reverse total shoulder arthroplasty, right 06/25/2024   Hypomagnesemia 05/20/2023   New onset a-fib (HCC) 05/19/2023   At risk for falls 05/09/2023   Essential hypertension 02/26/2023   Normocytic anemia 02/26/2023   Hx pulmonary embolism 02/25/2023   Lumbar degenerative disc disease 01/27/2023   Retrolisthesis of vertebrae-L2 on L3 approximately 7-8 mm 01/27/2023   Callus 10/21/2020   Early stage nonexudative age-related macular degeneration of left eye 04/13/2020   Advanced nonexudative age-related macular degeneration of right eye with subfoveal involvement 04/01/2020   Degenerative retinal drusen of left  eye 04/01/2020   Pseudophakia, both eyes 04/01/2020   Posterior vitreous detachment of left eye 04/01/2020   Anxiety 03/05/2019   Chronic bilateral thoracic back pain 08/03/2018   Iron deficiency anemia 07/04/2018   Peripheral arterial disease 07/11/2017   Varicose veins of both lower extremities 08/31/2016   Macular degeneration 05/05/2015   Hiatal  hernia 01/28/2015   Mild nonproliferative retinopathy of both eyes with macular edema associated with type 2 diabetes mellitus (HCC) 10/02/2013   Type 2 diabetes mellitus (HCC) 03/24/2009   GERD 03/24/2009   PCP:  Catherine Charlies LABOR, DO Pharmacy:   [facility] [place], [place] - [address] [place] [address] Phone: [phone] Fax: [phone]  [id number] - [place], [place] - [address] AT [facility] [address] [place] [address] Phone: [phone] Fax: [phone]     Social Drivers of Health (SDOH) Social History: SDOH Screenings   Food Insecurity: No Food Insecurity (06/26/2024)  Housing: Low Risk (06/26/2024)  Transportation Needs: No Transportation Needs (06/26/2024)  Utilities: Not At Risk (06/26/2024)  Alcohol Screen: Low Risk (11/01/2023)  Depression (PHQ2-9): Low Risk (03/05/2024)  Financial Resource Strain: Low Risk (11/01/2023)  Physical Activity: Inactive (11/01/2023)  Social Connections: Unknown (06/26/2024)  Stress: No Stress Concern Present (11/01/2023)  Tobacco Use: Low Risk (06/25/2024)  Health Literacy: Adequate Health Literacy (11/01/2023)   SDOH Interventions:     Readmission Risk Interventions     No data to display

## 2024-06-26 NOTE — Plan of Care (Signed)
" °  Problem: Education: Goal: Knowledge of the prescribed therapeutic regimen will improve Outcome: Progressing   Problem: Bowel/Gastric: Goal: Gastrointestinal status for postoperative course will improve Outcome: Progressing   Problem: Cardiac: Goal: Ability to maintain an adequate cardiac output Outcome: Progressing Goal: Will show no evidence of cardiac arrhythmias Outcome: Progressing   Problem: Nutritional: Goal: Will attain and maintain optimal nutritional status Outcome: Progressing   Problem: Neurological: Goal: Will regain or maintain usual level of consciousness Outcome: Progressing   Problem: Clinical Measurements: Goal: Ability to maintain clinical measurements within normal limits Outcome: Progressing Goal: Postoperative complications will be avoided or minimized Outcome: Progressing   Problem: Respiratory: Goal: Will regain and/or maintain adequate ventilation Outcome: Progressing Goal: Respiratory status will improve Outcome: Progressing   Problem: Skin Integrity: Goal: Demonstrates signs of wound healing without infection Outcome: Progressing   Problem: Urinary Elimination: Goal: Will remain free from infection Outcome: Progressing Goal: Ability to achieve and maintain adequate urine output Outcome: Progressing   Problem: Education: Goal: Knowledge of General Education information will improve Description: Including pain rating scale, medication(s)/side effects and non-pharmacologic comfort measures Outcome: Progressing   Problem: Health Behavior/Discharge Planning: Goal: Ability to manage health-related needs will improve Outcome: Progressing   Problem: Clinical Measurements: Goal: Ability to maintain clinical measurements within normal limits will improve Outcome: Progressing Goal: Will remain free from infection Outcome: Progressing Goal: Diagnostic test results will improve Outcome: Progressing Goal: Respiratory complications will  improve Outcome: Progressing Goal: Cardiovascular complication will be avoided Outcome: Progressing   Problem: Activity: Goal: Risk for activity intolerance will decrease Outcome: Progressing   Problem: Nutrition: Goal: Adequate nutrition will be maintained Outcome: Progressing   Problem: Coping: Goal: Level of anxiety will decrease Outcome: Progressing   Problem: Elimination: Goal: Will not experience complications related to bowel motility Outcome: Progressing Goal: Will not experience complications related to urinary retention Outcome: Progressing   Problem: Pain Managment: Goal: General experience of comfort will improve and/or be controlled Outcome: Progressing   Problem: Safety: Goal: Ability to remain free from injury will improve Outcome: Progressing   Problem: Skin Integrity: Goal: Risk for impaired skin integrity will decrease Outcome: Progressing   Problem: Education: Goal: Ability to describe self-care measures that may prevent or decrease complications (Diabetes Survival Skills Education) will improve Outcome: Progressing Goal: Individualized Educational Video(s) Outcome: Progressing   Problem: Coping: Goal: Ability to adjust to condition or change in health will improve Outcome: Progressing   Problem: Fluid Volume: Goal: Ability to maintain a balanced intake and output will improve Outcome: Progressing   Problem: Health Behavior/Discharge Planning: Goal: Ability to identify and utilize available resources and services will improve Outcome: Progressing Goal: Ability to manage health-related needs will improve Outcome: Progressing   Problem: Metabolic: Goal: Ability to maintain appropriate glucose levels will improve Outcome: Progressing   Problem: Nutritional: Goal: Maintenance of adequate nutrition will improve Outcome: Progressing Goal: Progress toward achieving an optimal weight will improve Outcome: Progressing   Problem: Skin  Integrity: Goal: Risk for impaired skin integrity will decrease Outcome: Progressing   Problem: Tissue Perfusion: Goal: Adequacy of tissue perfusion will improve Outcome: Progressing   Problem: Education: Goal: Knowledge of the prescribed therapeutic regimen will improve Outcome: Progressing Goal: Understanding of activity limitations/precautions following surgery will improve Outcome: Progressing Goal: Individualized Educational Video(s) Outcome: Progressing   Problem: Activity: Goal: Ability to tolerate increased activity will improve Outcome: Progressing   Problem: Pain Management: Goal: Pain level will decrease with appropriate interventions Outcome: Progressing   "

## 2024-06-26 NOTE — Progress Notes (Signed)
 BG 270, called after hours line with Dr. Josefina, spoke with Katrina.  Message given that there is no order for night time insulin  coverage.

## 2024-06-27 ENCOUNTER — Observation Stay (HOSPITAL_COMMUNITY)

## 2024-06-27 ENCOUNTER — Inpatient Hospital Stay (HOSPITAL_COMMUNITY)

## 2024-06-27 DIAGNOSIS — Z7984 Long term (current) use of oral hypoglycemic drugs: Secondary | ICD-10-CM | POA: Diagnosis not present

## 2024-06-27 DIAGNOSIS — Z90710 Acquired absence of both cervix and uterus: Secondary | ICD-10-CM | POA: Diagnosis not present

## 2024-06-27 DIAGNOSIS — I480 Paroxysmal atrial fibrillation: Secondary | ICD-10-CM | POA: Diagnosis present

## 2024-06-27 DIAGNOSIS — Z860100 Personal history of colon polyps, unspecified: Secondary | ICD-10-CM | POA: Diagnosis not present

## 2024-06-27 DIAGNOSIS — S42291A Other displaced fracture of upper end of right humerus, initial encounter for closed fracture: Secondary | ICD-10-CM | POA: Diagnosis present

## 2024-06-27 DIAGNOSIS — Z90721 Acquired absence of ovaries, unilateral: Secondary | ICD-10-CM | POA: Diagnosis not present

## 2024-06-27 DIAGNOSIS — Z79899 Other long term (current) drug therapy: Secondary | ICD-10-CM | POA: Diagnosis not present

## 2024-06-27 DIAGNOSIS — Z7982 Long term (current) use of aspirin: Secondary | ICD-10-CM | POA: Diagnosis not present

## 2024-06-27 DIAGNOSIS — Z86711 Personal history of pulmonary embolism: Secondary | ICD-10-CM | POA: Diagnosis not present

## 2024-06-27 DIAGNOSIS — E785 Hyperlipidemia, unspecified: Secondary | ICD-10-CM | POA: Diagnosis present

## 2024-06-27 DIAGNOSIS — Z86718 Personal history of other venous thrombosis and embolism: Secondary | ICD-10-CM | POA: Diagnosis not present

## 2024-06-27 DIAGNOSIS — Z91128 Patient's intentional underdosing of medication regimen for other reason: Secondary | ICD-10-CM | POA: Diagnosis not present

## 2024-06-27 DIAGNOSIS — K219 Gastro-esophageal reflux disease without esophagitis: Secondary | ICD-10-CM | POA: Diagnosis present

## 2024-06-27 DIAGNOSIS — S42241A 4-part fracture of surgical neck of right humerus, initial encounter for closed fracture: Secondary | ICD-10-CM | POA: Diagnosis present

## 2024-06-27 DIAGNOSIS — Z8249 Family history of ischemic heart disease and other diseases of the circulatory system: Secondary | ICD-10-CM | POA: Diagnosis not present

## 2024-06-27 DIAGNOSIS — E1165 Type 2 diabetes mellitus with hyperglycemia: Secondary | ICD-10-CM | POA: Diagnosis not present

## 2024-06-27 DIAGNOSIS — Z803 Family history of malignant neoplasm of breast: Secondary | ICD-10-CM | POA: Diagnosis not present

## 2024-06-27 DIAGNOSIS — T45516A Underdosing of anticoagulants, initial encounter: Secondary | ICD-10-CM | POA: Diagnosis present

## 2024-06-27 LAB — GLUCOSE, CAPILLARY
Glucose-Capillary: 119 mg/dL — ABNORMAL HIGH (ref 70–99)
Glucose-Capillary: 105 mg/dL — ABNORMAL HIGH (ref 70–99)
Glucose-Capillary: 113 mg/dL — ABNORMAL HIGH (ref 70–99)
Glucose-Capillary: 176 mg/dL — ABNORMAL HIGH (ref 70–99)

## 2024-06-27 LAB — CBC
HCT: 28.2 % — ABNORMAL LOW (ref 36.0–46.0)
Hemoglobin: 9 g/dL — ABNORMAL LOW (ref 12.0–15.0)
MCH: 31.1 pg (ref 26.0–34.0)
MCHC: 31.9 g/dL (ref 30.0–36.0)
MCV: 97.6 fL (ref 80.0–100.0)
Platelets: 363 K/uL (ref 150–400)
RBC: 2.89 MIL/uL — ABNORMAL LOW (ref 3.87–5.11)
RDW: 14.4 % (ref 11.5–15.5)
WBC: 8.4 K/uL (ref 4.0–10.5)
nRBC: 0 % (ref 0.0–0.2)

## 2024-06-27 MED ORDER — HYDROCODONE-ACETAMINOPHEN 7.5-325 MG PO TABS: 1.0000 | ORAL_TABLET | ORAL | Status: DC | PRN

## 2024-06-27 MED ORDER — HYDROCODONE-ACETAMINOPHEN 5-325 MG PO TABS
1.0000 | ORAL_TABLET | ORAL | Status: DC | PRN
  Administered 2024-06-29 (×2): 1 via ORAL
  Filled 2024-06-27 (×3): qty 1

## 2024-06-27 MED ORDER — ALBUTEROL SULFATE (2.5 MG/3ML) 0.083% IN NEBU
2.5000 mg | INHALATION_SOLUTION | Freq: Once | RESPIRATORY_TRACT | Status: AC
  Administered 2024-06-27: 2.5 mg via RESPIRATORY_TRACT
  Filled 2024-06-27: qty 3

## 2024-06-27 MED ORDER — IOHEXOL 350 MG/ML SOLN
75.0000 mL | Freq: Once | INTRAVENOUS | Status: AC | PRN
  Administered 2024-06-27: 75 mL via INTRAVENOUS

## 2024-06-27 MED ORDER — ALBUTEROL SULFATE (2.5 MG/3ML) 0.083% IN NEBU
INHALATION_SOLUTION | RESPIRATORY_TRACT | Status: AC
  Filled 2024-06-27: qty 3

## 2024-06-27 MED ORDER — ALBUTEROL SULFATE (2.5 MG/3ML) 0.083% IN NEBU
2.5000 mg | INHALATION_SOLUTION | Freq: Once | RESPIRATORY_TRACT | Status: AC
  Administered 2024-06-27: 2.5 mg via RESPIRATORY_TRACT

## 2024-06-27 NOTE — Progress Notes (Signed)
 "    Subjective: 2 Days Post-Op s/p Procedures: ARTHROPLASTY, SHOULDER, TOTAL, REVERSE   Patient is alert, oriented. Sleeping when I enter the room but easily awoken. She states coughing from yesterday has improved but she had some shortness of breath yesterday when up and walking and now some at rest this morning too. Denies chest pain. No nausea/vomiting.  No other complaints.  Objective:  PE: VITALS:   Vitals:   06/26/24 1045 06/26/24 1405 06/26/24 2217 06/27/24 0133  BP: 106/60 113/63 130/70 120/72  Pulse: 92 95 100 95  Resp: 18 18 14 14   Temp: 97.7 F (36.5 C) 98.1 F (36.7 C) 97.9 F (36.6 C) 97.9 F (36.6 C)  TempSrc:  Oral    SpO2: 96% 95% 92% 93%  Weight:      Height:       General: sitting up in bed, in no acute distress Pulm: lungs clear to auscultation bilaterally  MSK: RUE in sling. Full motion at all fingers of right hand. Sensation intact to all fingers of right hand. 2+ radial pulse. Incision CDI.   LABS  Results for orders placed or performed during the hospital encounter of 06/25/24 (from the past 24 hours)  Glucose, capillary     Status: Abnormal   Collection Time: 06/26/24  7:48 AM  Result Value Ref Range   Glucose-Capillary 128 (H) 70 - 99 mg/dL  Glucose, capillary     Status: Abnormal   Collection Time: 06/26/24 11:54 AM  Result Value Ref Range   Glucose-Capillary 132 (H) 70 - 99 mg/dL  Glucose, capillary     Status: Abnormal   Collection Time: 06/26/24  4:51 PM  Result Value Ref Range   Glucose-Capillary 128 (H) 70 - 99 mg/dL  Glucose, capillary     Status: Abnormal   Collection Time: 06/26/24 10:19 PM  Result Value Ref Range   Glucose-Capillary 143 (H) 70 - 99 mg/dL  CBC     Status: Abnormal   Collection Time: 06/27/24  3:28 AM  Result Value Ref Range   WBC 8.4 4.0 - 10.5 K/uL   RBC 2.89 (L) 3.87 - 5.11 MIL/uL   Hemoglobin 9.0 (L) 12.0 - 15.0 g/dL   HCT 71.7 (L) 63.9 - 53.9 %   MCV 97.6 80.0 - 100.0 fL   MCH 31.1 26.0 - 34.0 pg    MCHC 31.9 30.0 - 36.0 g/dL   RDW 85.5 88.4 - 84.4 %   Platelets 363 150 - 400 K/uL   nRBC 0.0 0.0 - 0.2 %    DG Shoulder Right Port Result Date: 06/25/2024 EXAM: 1 VIEW XRAY OF THE RIGHT SHOULDER 06/25/2024 06:02:00 PM COMPARISON: 06/08/2024. CLINICAL HISTORY: S/P reverse total shoulder arthroplasty, right. FINDINGS: BONES AND JOINTS: Interval Right reverse total shoulder arthroplasty noted. Acute mildly comminuted fracture of the lateral aspect of the proximal humeral diaphysis adjacent to the arthroplasty hardware. Residual fracture fragment lateral to the arthroplasty. The [employer] [facility] joint is unremarkable. SOFT TISSUES: Subcutaneous emphysema about the right shoulder joint. No abnormal calcifications. Visualized lung is unremarkable. IMPRESSION: 1. Interval right shoulder reverse arthroplasty with residual fracture fragments and fracture line demonstrated adjacent to the humeral component. 2. Subcutaneous emphysema about the right shoulder joint. Electronically signed by: Elsie Gravely MD 06/25/2024 10:22 PM EST RP Workstation: [id number]    Today's  total administered Morphine  Milligram Equivalents: 7.5 Yesterday's total administered Morphine  Milligram Equivalents: 5  Assessment/Plan: Principal Problem:   S/P reverse total shoulder arthroplasty, right  2 Days Post-Op s/p Procedures: ARTHROPLASTY,  SHOULDER, TOTAL, REVERSE - ABLA Hbg 9.5 yesterday >9.0 this morning. 2.5 mg Eliquis  restarted yesterday morning. On Eliquis  due to history of PE. Will continue to watch.   SOB:  - patient does have intermittent dyspnea at baseline due to her a fib. Vitals within normal range this morning.  - ordered chest x-ray   Weightbearing: NWB RUE Insicional and dressing care: Reinforce dressings as needed Orthopedic device(s): Sling at all times Pain control: block still in place, continue current regimen Follow - up plan: 2 weeks with Dr. Josefina Dispo: Patient states that her family is pursuing SNF placement  due to her living at home with limited support.They specifically are interested in [facility], [place] on board. OT and PT evals support SNF placement. Patient continues to be undergoing work up, unlikely to be ready to discharge to SNF today, she may be ready tomorrow.    Contact information:   Army Daring, DEVONNA Tzzxijbd 8-5  After hours and holidays please check [email or web address] for group call information for [facility]  Army MARLA Daring 06/27/2024, 6:50 AM "

## 2024-06-27 NOTE — Plan of Care (Signed)
" °  Problem: Bowel/Gastric: Goal: Gastrointestinal status for postoperative course will improve Outcome: Progressing   Problem: Cardiac: Goal: Ability to maintain an adequate cardiac output Outcome: Progressing Goal: Will show no evidence of cardiac arrhythmias Outcome: Progressing   Problem: Neurological: Goal: Will regain or maintain usual level of consciousness Outcome: Progressing   Problem: Clinical Measurements: Goal: Ability to maintain clinical measurements within normal limits Outcome: Progressing Goal: Postoperative complications will be avoided or minimized Outcome: Progressing   Problem: Respiratory: Goal: Will regain and/or maintain adequate ventilation Outcome: Progressing Goal: Respiratory status will improve Outcome: Progressing   Problem: Skin Integrity: Goal: Demonstrates signs of wound healing without infection Outcome: Progressing   Problem: Urinary Elimination: Goal: Will remain free from infection Outcome: Progressing Goal: Ability to achieve and maintain adequate urine output Outcome: Progressing   "

## 2024-06-27 NOTE — Progress Notes (Signed)
 Pt returned to unit from CT. Pt is resting in bed,a elrt and oriented, on 2L of O2 via nasal cannula, not in distress.

## 2024-06-27 NOTE — Plan of Care (Signed)
  Problem: Education: Goal: Knowledge of General Education information will improve Description: Including pain rating scale, medication(s)/side effects and non-pharmacologic comfort measures Outcome: Progressing   Problem: Health Behavior/Discharge Planning: Goal: Ability to manage health-related needs will improve Outcome: Progressing   Problem: Clinical Measurements: Goal: Ability to maintain clinical measurements within normal limits will improve Outcome: Progressing   Problem: Nutrition: Goal: Adequate nutrition will be maintained Outcome: Progressing   Problem: Coping: Goal: Level of anxiety will decrease Outcome: Progressing   Problem: Elimination: Goal: Will not experience complications related to bowel motility Outcome: Progressing   Problem: Pain Managment: Goal: General experience of comfort will improve and/or be controlled Outcome: Progressing   Problem: Safety: Goal: Ability to remain free from injury will improve Outcome: Progressing   Problem: Skin Integrity: Goal: Risk for impaired skin integrity will decrease Outcome: Progressing

## 2024-06-27 NOTE — Plan of Care (Signed)
" °  Problem: Education: Goal: Knowledge of the prescribed therapeutic regimen will improve Outcome: Progressing   Problem: Bowel/Gastric: Goal: Gastrointestinal status for postoperative course will improve Outcome: Progressing   Problem: Cardiac: Goal: Ability to maintain an adequate cardiac output Outcome: Progressing Goal: Will show no evidence of cardiac arrhythmias Outcome: Progressing   Problem: Nutritional: Goal: Will attain and maintain optimal nutritional status Outcome: Progressing   Problem: Neurological: Goal: Will regain or maintain usual level of consciousness Outcome: Progressing   Problem: Clinical Measurements: Goal: Ability to maintain clinical measurements within normal limits Outcome: Progressing Goal: Postoperative complications will be avoided or minimized Outcome: Progressing   Problem: Respiratory: Goal: Will regain and/or maintain adequate ventilation Outcome: Progressing Goal: Respiratory status will improve Outcome: Progressing   Problem: Skin Integrity: Goal: Demonstrates signs of wound healing without infection Outcome: Progressing   Problem: Urinary Elimination: Goal: Will remain free from infection Outcome: Progressing Goal: Ability to achieve and maintain adequate urine output Outcome: Progressing   Problem: Education: Goal: Knowledge of General Education information will improve Description: Including pain rating scale, medication(s)/side effects and non-pharmacologic comfort measures Outcome: Progressing   Problem: Health Behavior/Discharge Planning: Goal: Ability to manage health-related needs will improve Outcome: Progressing   Problem: Clinical Measurements: Goal: Ability to maintain clinical measurements within normal limits will improve Outcome: Progressing Goal: Will remain free from infection Outcome: Progressing Goal: Diagnostic test results will improve Outcome: Progressing Goal: Respiratory complications will  improve Outcome: Progressing Goal: Cardiovascular complication will be avoided Outcome: Progressing   Problem: Activity: Goal: Risk for activity intolerance will decrease Outcome: Progressing   Problem: Nutrition: Goal: Adequate nutrition will be maintained Outcome: Progressing   Problem: Coping: Goal: Level of anxiety will decrease Outcome: Progressing   Problem: Elimination: Goal: Will not experience complications related to bowel motility Outcome: Progressing Goal: Will not experience complications related to urinary retention Outcome: Progressing   Problem: Pain Managment: Goal: General experience of comfort will improve and/or be controlled Outcome: Progressing   Problem: Safety: Goal: Ability to remain free from injury will improve Outcome: Progressing   Problem: Skin Integrity: Goal: Risk for impaired skin integrity will decrease Outcome: Progressing   Problem: Education: Goal: Ability to describe self-care measures that may prevent or decrease complications (Diabetes Survival Skills Education) will improve Outcome: Progressing Goal: Individualized Educational Video(s) Outcome: Progressing   Problem: Coping: Goal: Ability to adjust to condition or change in health will improve Outcome: Progressing   Problem: Fluid Volume: Goal: Ability to maintain a balanced intake and output will improve Outcome: Progressing   Problem: Health Behavior/Discharge Planning: Goal: Ability to identify and utilize available resources and services will improve Outcome: Progressing Goal: Ability to manage health-related needs will improve Outcome: Progressing   Problem: Metabolic: Goal: Ability to maintain appropriate glucose levels will improve Outcome: Progressing   Problem: Nutritional: Goal: Maintenance of adequate nutrition will improve Outcome: Progressing Goal: Progress toward achieving an optimal weight will improve Outcome: Progressing   Problem: Skin  Integrity: Goal: Risk for impaired skin integrity will decrease Outcome: Progressing   Problem: Tissue Perfusion: Goal: Adequacy of tissue perfusion will improve Outcome: Progressing   Problem: Education: Goal: Knowledge of the prescribed therapeutic regimen will improve Outcome: Progressing Goal: Understanding of activity limitations/precautions following surgery will improve Outcome: Progressing Goal: Individualized Educational Video(s) Outcome: Progressing   Problem: Activity: Goal: Ability to tolerate increased activity will improve Outcome: Progressing   Problem: Pain Management: Goal: Pain level will decrease with appropriate interventions Outcome: Progressing   "

## 2024-06-27 NOTE — Progress Notes (Signed)
 Pt left unit for CT via wheelchair. Pt is alert and oriented, on 2L of O2 via nasal cannula, not in any distress.

## 2024-06-27 NOTE — Progress Notes (Addendum)
 Occupational Therapy Treatment Patient Details Name: Melissa [name] MRN: [id number] DOB: [birth year] Today's Date: 06/27/2024   History of present illness Patient is an 89 yr old female presenting to the hospital for a R total shoulder arthroplasty on 06/25/24 after she fell walking her dog and sustained a proximal R humerus fracture. PMH: diabetes, GERD, recent PE on eliquis , afib, right bundle branch block   OT comments  OT educated the pt on compensatory strategies for performing self-care tasks. She was instructed on and performed upper body dressing, lower body dressing, and grooming seated in the chair (see ADL section below). She required mod-max assist for dressing tasks, as well as to donn her RUE sling. OT further reinforced her post-surgical instructions and recommendations, specifically performing RUE elbow, wrist and hand ROM, no RUE shoulder ROM, proper upper extremity and sling positioning, donning/doffing upper extremity clothing, recommendations for bathing while maintaining shoulder precautions, how to use Iceman machine, use of cold/ice for pain and edema management, non-weight-bearing status, sling wear schedule, and correctly donning/doffing sling. The patient presented with fair+ understanding, recall and teach back abilities. Continue OT plan of care. Patient will benefit from continued inpatient follow up therapy, <3 hours/day       If plan is discharge home, recommend the following:  A little help with walking and/or transfers;A lot of help with bathing/dressing/bathroom;Assistance with cooking/housework;Assist for transportation   Equipment Recommendations  Other (comment) (defer to next setting)    Recommendations for Other Services      Precautions / Restrictions Precautions Precautions: Shoulder;Fall Type of Shoulder Precautions: No AROM No PROM of shoulder, NWB RUE, AROM elbow, wrist, and hand permitted, sling at all times Shoulder Interventions: Shoulder  sling/immobilizer;At all times Precaution Booklet Issued: Yes (comment) Required Braces or Orthoses: Sling Restrictions Weight Bearing Restrictions Per Provider Order: Yes RUE Weight Bearing Per Provider Order: Non weight bearing       Mobility Bed Mobility      General bed mobility comments: pt was received seated EOB    Transfers Overall transfer level: Needs assistance Equipment used: 1 person hand held assist Transfers: Sit to/from Stand Sit to Stand: Min assist                 Balance Overall balance assessment: Mild deficits observed, not formally tested          ADL either performed or assessed with clinical judgement   ADL Overall ADL's : Needs assistance/impaired     Grooming: Set up;Sitting Grooming Details (indicate cue type and reason): Pt required set-up assist to perform teeth brushing in supported sitting a bedside chair level.         Upper Body Dressing : Maximal assistance;Sitting;Cueing for UE precautions;Cueing for compensatory techniques;Cueing for sequencing Upper Body Dressing Details (indicate cue type and reason): Pt was instructed on donning a hospital gown seated EOB, with an emphasis placed on donning affected surgical RUE through the sleeve first. She also required increased assist & instruction on correctly donning and adjusting her RUE sling. Lower Body Dressing: Moderate assistance;Cueing for compensatory techniques;Cueing for sequencing Lower Body Dressing Details (indicate cue type and reason): Pt required assist to a brief, then to donn another clean one. She was instructed on performing most of the tasks seated, for added safety and decreased falls risk.                     Communication Communication Communication: No apparent difficulties Factors Affecting Communication: Hearing impaired   Cognition  Arousal: Alert Behavior During Therapy: WFL for tasks assessed/performed Cognition: No apparent impairments       Following commands: Intact        Cueing   Cueing Techniques: Verbal cues             Pertinent Vitals/ Pain       Pain Assessment Pain Assessment: No/denies pain   Frequency  Min 2X/week        Progress Toward Goals  OT Goals(current goals can now be found in the care plan section)     Acute Rehab OT Goals Patient Stated Goal: to get better OT Goal Formulation: With patient Time For Goal Achievement: 07/10/24 Potential to Achieve Goals: Good  Plan         AM-PAC OT 6 Clicks Daily Activity     Outcome Measure   Help from another person eating meals?: A Little Help from another person taking care of personal grooming?: A Little Help from another person toileting, which includes using toliet, bedpan, or urinal?: A Lot Help from another person bathing (including washing, rinsing, drying)?: A Lot Help from another person to put on and taking off regular upper body clothing?: A Lot Help from another person to put on and taking off regular lower body clothing?: A Lot 6 Click Score: 14    End of Session Equipment Utilized During Treatment: Other (comment) (cane)  OT Visit Diagnosis: Unsteadiness on feet (R26.81);Muscle weakness (generalized) (M62.81);History of falling (Z91.81)   Activity Tolerance Patient tolerated treatment well   Patient Left in chair;with call bell/phone within reach   Nurse Communication Mobility status        Time: 8977-8898 OT Time Calculation (min): 39 min  Charges: OT Treatments $Self Care/Home Management : 38-52 mins    [name], OTR/L 06/27/2024, 12:49 PM

## 2024-06-27 NOTE — TOC Progression Note (Signed)
 Transition of Care [facility]) - Progression Note    Patient Details  Name: Melissa [name] MRN: [id number] Date of Birth: Aug 11, 1934  [facility] CM/SW Contact  NORMAN ASPEN, LCSW Phone Number: 06/27/2024, 2:40 PM  Clinical Narrative:     Insurance authorization for SNF begun with [employer].  SNF bed search begun, however, [facility] (family's first choice) is full and unable to offer a bed.  Spoke with daughter, Olam, to update and she understands search area widened and await bed offers.  Expected Discharge Plan: Skilled Nursing Facility Barriers to Discharge: English As [employer] Language [employer], SNF Pending bed offer               Expected Discharge Plan and Services In-house Referral: Clinical Social Work   Post Acute Care Choice: Skilled Nursing Facility Living arrangements for the past 2 months: Single Family Home                                       Social Drivers of Health (SDOH) Interventions SDOH Screenings   Food Insecurity: No Food Insecurity (06/26/2024)  Housing: Low Risk (06/26/2024)  Transportation Needs: No Transportation Needs (06/26/2024)  Utilities: Not At Risk (06/26/2024)  Alcohol Screen: Low Risk (11/01/2023)  Depression (PHQ2-9): Low Risk (03/05/2024)  Financial Resource Strain: Low Risk (11/01/2023)  Physical Activity: Inactive (11/01/2023)  Social Connections: Unknown (06/26/2024)  Stress: No Stress Concern Present (11/01/2023)  Tobacco Use: Low Risk (06/25/2024)  Health Literacy: Adequate Health Literacy (11/01/2023)    Readmission Risk Interventions     No data to display

## 2024-06-28 LAB — GLUCOSE, CAPILLARY
Glucose-Capillary: 75 mg/dL (ref 70–99)
Glucose-Capillary: 114 mg/dL — ABNORMAL HIGH (ref 70–99)
Glucose-Capillary: 133 mg/dL — ABNORMAL HIGH (ref 70–99)
Glucose-Capillary: 137 mg/dL — ABNORMAL HIGH (ref 70–99)

## 2024-06-28 LAB — CBC
HCT: 30.9 % — ABNORMAL LOW (ref 36.0–46.0)
Hemoglobin: 9.8 g/dL — ABNORMAL LOW (ref 12.0–15.0)
MCH: 30.9 pg (ref 26.0–34.0)
MCHC: 31.7 g/dL (ref 30.0–36.0)
MCV: 97.5 fL (ref 80.0–100.0)
Platelets: 357 K/uL (ref 150–400)
RBC: 3.17 MIL/uL — ABNORMAL LOW (ref 3.87–5.11)
RDW: 14.4 % (ref 11.5–15.5)
WBC: 8.4 K/uL (ref 4.0–10.5)
nRBC: 0 % (ref 0.0–0.2)

## 2024-06-28 NOTE — TOC Progression Note (Signed)
 Transition of Care [facility]) - Progression Note   Patient Details  Name: Melissa [name] MRN: [id number] Date of Birth: Sep 12, 1934  [facility]) CM/SW Contact  Duwaine GORMAN Aran, LCSW Phone Number: 06/28/2024, 2:56 PM  Clinical Narrative: CSW received voicemail from [place] with [employer] that patient is approved for SNF ([id number]) for 7 days, but was not approved for PTAR. CSW met with patient to see if she would make a second choice for SNF. Patient reported she wants to go to [facility] and not the other facilities. CSW left voicemail for [employer] with patient's facility choice. CSW reached out to [place] with [facility] and is awaiting confirmation for admission date.  Expected Discharge Plan: Skilled Nursing Facility Barriers to Discharge: English As A Second Language Teacher, SNF Pending bed offer  Expected Discharge Plan and Services In-house Referral: Clinical Social Work Post Acute Care Choice: Skilled Nursing Facility Living arrangements for the past 2 months: Single Family Home  Social Drivers of Health (SDOH) Interventions SDOH Screenings   Food Insecurity: No Food Insecurity (06/26/2024)  Housing: Low Risk (06/26/2024)  Transportation Needs: No Transportation Needs (06/26/2024)  Utilities: Not At Risk (06/26/2024)  Alcohol Screen: Low Risk (11/01/2023)  Depression (PHQ2-9): Low Risk (03/05/2024)  Financial Resource Strain: Low Risk (11/01/2023)  Physical Activity: Inactive (11/01/2023)  Social Connections: Unknown (06/26/2024)  Stress: No Stress Concern Present (11/01/2023)  Tobacco Use: Low Risk (06/25/2024)  Health Literacy: Adequate Health Literacy (11/01/2023)   Readmission Risk Interventions     No data to display

## 2024-06-28 NOTE — Progress Notes (Signed)
 Physical Therapy Treatment Patient Details Name: Melissa [name] MRN: [id number] DOB: December 31, 1934 Today's Date: 06/28/2024   History of Present Illness Patient is an 89 yo female presenting to the hospital for a R total shoulder arthroplasty on 06/25/24. PMH: diabetes, GERD, recent PE on eliquis , afib, right bundle branch block    PT Comments  Pt motivated. Pt amb in hallway with SPC in L hand and sling on R. Pt challenged with head turns horizontally and vertically, noted to drift and decreased cadence, min A to CGA for safety. Pt completes transfers from bed and toilet, cues for L hand placement for safety, balance challenged when attempting to wash L hand so no UE support. Therapist assisted pt into recliner at end of session, propped RUE up due to R hand edema noted and returned ice. Pt reports has a small dog at home to care for and niece and son both work during the day and are unable to assist her; continue to recommend continued inpatient follow up therapy, <3 hours/day prior to return home alone.   If plan is discharge home, recommend the following: A little help with walking and/or transfers;A little help with bathing/dressing/bathroom;Assistance with cooking/housework;Assist for transportation;Help with stairs or ramp for entrance   Can travel by private vehicle     Yes  Equipment Recommendations  None recommended by PT    Recommendations for Other Services       Precautions / Restrictions Precautions Precautions: Shoulder;Fall Type of Shoulder Precautions: No AROM No PROM of shoulder, NWB RUE, AROM elbow, wrist, and hand permitted, sling at all times Shoulder Interventions: Shoulder sling/immobilizer;At all times Precaution Booklet Issued: Yes (comment) Required Braces or Orthoses: Sling Restrictions Weight Bearing Restrictions Per Provider Order: Yes RUE Weight Bearing Per Provider Order: Non weight bearing     Mobility  Bed Mobility Overal bed mobility: Modified  Independent             General bed mobility comments: using bedrail and slight HOB elevated, no assist or cues, respects NWB RUE in sling    Transfers Overall transfer level: Needs assistance Equipment used: Straight cane Transfers: Sit to/from Stand Sit to Stand: Min assist           General transfer comment: Min A for safety    Ambulation/Gait Ambulation/Gait assistance: Min assist, Contact guard assist Gait Distance (Feet): 120 Feet     Gait velocity: decreased but funcitonal     General Gait Details: step through gait pattern, decreased bil foot clearance, drifting gait R/L with head turns laterally and vertically, R arm in sling and using SPC in L arm, denies dyspnea with ambulation   Stairs             Wheelchair Mobility     Tilt Bed    Modified Rankin (Stroke Patients Only)       Balance Overall balance assessment: Mild deficits observed, not formally tested                                          Communication Communication Communication: No apparent difficulties Factors Affecting Communication: Hearing impaired  Cognition Arousal: Alert Behavior During Therapy: WFL for tasks assessed/performed   PT - Cognitive impairments: No apparent impairments                         Following commands: Intact  Cueing Cueing Techniques: Verbal cues  Exercises      General Comments General comments (skin integrity, edema, etc.): Pt on RA thorughout, SpO2 94% at beginning of session and 92% with ambulation.      Pertinent Vitals/Pain Pain Assessment Pain Assessment: No/denies pain    Home Living                          Prior Function            PT Goals (current goals can now be found in the care plan section) Acute Rehab PT Goals Patient Stated Goal: regain independence PT Goal Formulation: With patient Time For Goal Achievement: 07/10/24 Potential to Achieve Goals: Good Progress  towards PT goals: Progressing toward goals    Frequency    Min 2X/week      PT Plan      Co-evaluation              AM-PAC PT 6 Clicks Mobility   Outcome Measure  Help needed turning from your back to your side while in a flat bed without using bedrails?: A Little Help needed moving from lying on your back to sitting on the side of a flat bed without using bedrails?: A Little Help needed moving to and from a bed to a chair (including a wheelchair)?: A Little Help needed standing up from a chair using your arms (e.g., wheelchair or bedside chair)?: A Little Help needed to walk in hospital room?: A Little Help needed climbing 3-5 steps with a railing? : A Lot 6 Click Score: 17    End of Session Equipment Utilized During Treatment: Gait belt Activity Tolerance: Patient tolerated treatment well Patient left: in chair;with call bell/phone within reach;with chair alarm set Nurse Communication: Mobility status PT Visit Diagnosis: Unsteadiness on feet (R26.81);Muscle weakness (generalized) (M62.81);Pain Pain - Right/Left: Right Pain - part of body: Shoulder     Time: 8573-8542 PT Time Calculation (min) (ACUTE ONLY): 31 min  Charges:    $Gait Training: 8-22 mins $Therapeutic Activity: 8-22 mins PT General Charges $$ ACUTE PT VISIT: 1 Visit                     Tori Rainee Sweatt PT, DPT 06/28/2024, 3:06 PM

## 2024-06-28 NOTE — Progress Notes (Signed)
 SATURATION QUALIFICATIONS: (This note is used to comply with regulatory documentation for home oxygen)  Patient Saturations on Room Air at Rest = 95%  Patient Saturations on Room Air while Ambulating = 92-99%

## 2024-06-28 NOTE — Progress Notes (Signed)
 Discussed plan with TOC, patient has been at [facility] for Monday. She will remain inpatient over the weekend with plan to discharge on Monday to [facility], PA-C

## 2024-06-28 NOTE — Progress Notes (Signed)
 Occupational Therapy Treatment Patient Details Name: Melissa [name] MRN: [id number] DOB: 09-Oct-1934 Today's Date: 06/28/2024   History of present illness Patient is an 89 yr old female presenting to the hospital for a R total shoulder arthroplasty on 06/25/24 after she fell walking her dog and sustained a proximal R humerus fracture. PMH: diabetes, GERD, recent PE on eliquis , afib, right bundle branch block   OT comments  Pt was receive seated in chair. She was motivated to participate in the session. OT provided further reinforcement on correctly donning/doffing sling, and positioning RUE sling as well as shoulder pad for Ice man machine; pt required max assist in this regard while seated in the chair. She was also instructed on no RUE shoulder ROM, non-weight-bearing status, and sling wear schedule. She required SBA for performing multiple reps of RUE elbow, wrist and hand ROM seated in the chair. She further needed min assist to stand using a cane in her LUE. Continue OT plan of care. Patient will benefit from continued inpatient follow up therapy, <3 hours/day.       If plan is discharge home, recommend the following:  A little help with walking and/or transfers;A lot of help with bathing/dressing/bathroom;Assistance with cooking/housework;Assist for transportation   Equipment Recommendations  Other (comment) (defer to next setting)    Recommendations for Other Services      Precautions / Restrictions Precautions Precautions: Shoulder;Fall Type of Shoulder Precautions: No AROM No PROM of shoulder, NWB RUE, AROM elbow, wrist, and hand permitted, sling at all times Shoulder Interventions: Shoulder sling/immobilizer;At all times Precaution Booklet Issued: Yes (comment) Required Braces or Orthoses: Sling Restrictions Weight Bearing Restrictions Per Provider Order: Yes RUE Weight Bearing Per Provider Order: Non weight bearing       Mobility Bed Mobility               General  bed mobility comments: pt was received seated in chair    Transfers Overall transfer level: Needs assistance Equipment used: Straight cane Transfers: Sit to/from Stand Sit to Stand: Min assist                 Balance Overall balance assessment: Mild deficits observed, not formally tested            ADL either performed or assessed with clinical judgement   ADL Overall ADL's : Needs assistance/impaired                 Upper Body Dressing : Maximal assistance;Sitting;Cueing for UE precautions;Cueing for compensatory techniques;Cueing for sequencing Upper Body Dressing Details (indicate cue type and reason): OT provided further reinforcement on correctly donning/doffing and positioning RUE sling, as well as shoulder pad for Ice man machine. Pt required max assist in this regard seated in the chair.                         Vision Baseline Vision/History: 1 Wears glasses           Communication Communication Communication: No apparent difficulties Factors Affecting Communication: Hearing impaired   Cognition Arousal: Alert Behavior During Therapy: WFL for tasks assessed/performed Cognition: No apparent impairments                Following commands: Intact                 General Comments     Pertinent Vitals/ Pain       Pain Assessment Pain Assessment: No/denies pain  Frequency  Min 2X/week        Progress Toward Goals  OT Goals(current goals can now be found in the care plan section)     Acute Rehab OT Goals Patient Stated Goal: to get better and return home OT Goal Formulation: With patient Time For Goal Achievement: 07/10/24 Potential to Achieve Goals: Good  Plan         AM-PAC OT 6 Clicks Daily Activity     Outcome Measure   Help from another person eating meals?: A Little Help from another person taking care of personal grooming?: A Little Help from another person toileting, which includes using  toliet, bedpan, or urinal?: A Little Help from another person bathing (including washing, rinsing, drying)?: A Lot Help from another person to put on and taking off regular upper body clothing?: A Lot Help from another person to put on and taking off regular lower body clothing?: A Lot 6 Click Score: 15    End of Session Equipment Utilized During Treatment: Other (comment) (cane)  OT Visit Diagnosis: Unsteadiness on feet (R26.81);Muscle weakness (generalized) (M62.81);History of falling (Z91.81)   Activity Tolerance Patient tolerated treatment well   Patient Left in chair;with call bell/phone within reach;with chair alarm set   Nurse Communication Mobility status        Time: 8481-8465 OT Time Calculation (min): 16 min  Charges: OT General Charges $OT Visit: 1 Visit OT Treatments $Therapeutic Activity: 8-22 mins    [name], OTR/L 06/28/2024, 5:23 PM

## 2024-06-28 NOTE — Plan of Care (Signed)
" °  Problem: Clinical Measurements: Goal: Ability to maintain clinical measurements within normal limits Outcome: Progressing Goal: Postoperative complications will be avoided or minimized Outcome: Progressing   Problem: Activity: Goal: Risk for activity intolerance will decrease Outcome: Progressing   Problem: Pain Management: Goal: Pain level will decrease with appropriate interventions Outcome: Progressing   "

## 2024-06-28 NOTE — Progress Notes (Addendum)
 "    Subjective: 3 Days Post-Op s/p Procedures: ARTHROPLASTY, SHOULDER, TOTAL, REVERSE   Patient is alert, oriented. Sitting on side of bed. States shortness of breath yesterday has resolved, though she does get some shortness of breath with movement at baseline. No cough. No chest pain. No nausea/vomiting. New swelling in right hand.  No other complaints.  Objective:  PE: VITALS:   Vitals:   06/27/24 1304 06/27/24 1414 06/27/24 2042 06/28/24 0509  BP: 115/60  131/70 128/62  Pulse: 93 94 100 82  Resp: 18  20 18   Temp: 98 F (36.7 C)  99.4 F (37.4 C) 98.2 F (36.8 C)  TempSrc: Oral  Oral Oral  SpO2: 94% 98% 98% 100%  Weight:      Height:       General: sitting up in bed, in no acute distress MSK: RUE in sling. Block appears to have worn have. Right hand with new edema, patient's elbow was about half way out of sling. I fixed sling so hand is a little more elevated. Full motion at all fingers of right hand. Sensation intact to all fingers of right hand. Sensation intact to axillary nerve distribution. 2+ radial pulse. Incision CDI.   LABS  Results for orders placed or performed during the hospital encounter of 06/25/24 (from the past 24 hours)  Glucose, capillary     Status: Abnormal   Collection Time: 06/27/24 12:08 PM  Result Value Ref Range   Glucose-Capillary 105 (H) 70 - 99 mg/dL  Glucose, capillary     Status: Abnormal   Collection Time: 06/27/24  4:48 PM  Result Value Ref Range   Glucose-Capillary 113 (H) 70 - 99 mg/dL  Glucose, capillary     Status: Abnormal   Collection Time: 06/27/24  8:43 PM  Result Value Ref Range   Glucose-Capillary 176 (H) 70 - 99 mg/dL  Glucose, capillary     Status: None   Collection Time: 06/28/24  7:31 AM  Result Value Ref Range   Glucose-Capillary 75 70 - 99 mg/dL    CT Angio Chest Pulmonary Embolism (PE) W or WO Contrast Result Date: 06/27/2024 EXAM: CTA of the Chest with and without contrast for PE 06/27/2024 03:12:57 PM  TECHNIQUE: CTA of the chest was performed without and with the administration of 75 mL of intravenous contrast (iohexol  (OMNIPAQUE ) 350 MG/ML injection 75 mL IOHEXOL  350 MG/ML SOLN). Multiplanar reformatted images are provided for review. MIP images are provided for review. Automated exposure control, iterative reconstruction, and/or weight based adjustment of the mA/kV was utilized to reduce the radiation dose to as low as reasonably achievable. COMPARISON: 02/25/2023 CLINICAL HISTORY: Pulmonary embolism (PE) suspected, low to intermediate prob, positive D-dimer. FINDINGS: PULMONARY ARTERIES: The distal segmental and subsegmental branches of both lower lobes are degraded by motion artifact. Otherwise, no pulmonary embolism visualized. Main pulmonary artery is normal in caliber. MEDIASTINUM: Mild cardiomegaly. Scattered multivessel coronary atherosclerosis. Large hiatal hernia containing an intrathoracic stomach. The pericardium demonstrates no acute abnormality. There is no acute abnormality of the thoracic aorta. LYMPH NODES: No mediastinal, hilar or axillary lymphadenopathy. LUNGS AND PLEURA: Minimal subpleural reticulation along the lateral left upper and left lower lobes, possibly postinfectious fibrosis. Bibasilar atelectasis. Trace right pleural effusion. No pneumothorax. UPPER ABDOMEN: Large hiatal hernia containing an intrathoracic stomach. SOFT TISSUES AND BONES: Subcutaneous gas with gas interdigitating about the right shoulder musculature, which is an expected finding of this stage in the postoperative recovery. The glenohumeral arthroplasty is otherwise well aligned without dislocation. Diffuse  osteopenia. Multilevel degenerative disc disease of the spine. Unchanged 1 cm hypodensity in the upper inner quadrant of the left breast, which was previously aspirated. IMPRESSION: 1. No pulmonary embolism visualized, although evaluation of the distal segmental and subsegmental branches of both lower lobes are  degraded by motion artifact. 2. Trace right pleural effusion. 3. Large hiatal hernia containing an intrathoracic stomach. No findings of vascular compromise. Electronically signed by: Rogelia Myers MD MD 06/27/2024 04:20 PM EST RP Workstation: [id number]   DG Chest 1 View Result Date: 06/27/2024 CLINICAL DATA:  Shortness of breath. EXAM: CHEST  1 VIEW COMPARISON:  06/21/2023 FINDINGS: Patient is rotated to the left. Lungs are hypoinflated with bibasilar opacification left worse than right which may be due to atelectasis with effusions, although infection is possible. Interval right shoulder arthroplasty. Cardiomediastinal silhouette difficult to evaluate due to degree of rotation. Known large hiatal hernia. Remainder the exam is unchanged. IMPRESSION: 1. Hypoinflation with bibasilar opacification left worse than right which may be due to atelectasis with effusions, although infection is possible. Recommend repeat chest radiograph with less rotation versus PA and lateral chest radiograph for better evaluation of the lung bases. 2. Known large hiatal hernia. Electronically Signed   By: Toribio Agreste M.D.   On: 06/27/2024 08:52    Today's  total administered Morphine  Milligram Equivalents: 0 Yesterday's total administered Morphine  Milligram Equivalents: 7.5  Assessment/Plan: Principal Problem:   S/P reverse total shoulder arthroplasty, right  3 Days Post-Op s/p Procedures: ARTHROPLASTY, SHOULDER, TOTAL, REVERSE - ABLA Hbg 9.5>9.>CBC not back today 2.5 mg Eliquis  restarted postop day one morning. On Eliquis  due to history of PE. Will continue to watch.   SOB:  - improved with nebulizer yesterday and O2 therapy - patient does have intermittent dyspnea at baseline. Vitals within normal range this morning. Was on supplemental 2L O2 last night. PERC > 4, Chest x-ray and CT angio ordered yesterday negative for infection or PE. Room air trial today.   Weightbearing: NWB RUE Insicional and dressing care:  Reinforce dressings as needed Orthopedic device(s): Sling at all times Pain control: block still in place, continue current regimen Follow - up plan: 2 weeks with Dr. Josefina Dispo: Patient states that her family is pursuing SNF placement due to her living at home with limited support.They specifically are interested in [facility], [place] on board. OT and PT evals support SNF placement. Patient likely ready for discharge to SNF today if bed available.    Addendum 12:01: Hbg trending up to 9.8 and patient tolerating walking without oxygen well. Able to discharge when SNF bed available.   Contact information:   Army Daring, DEVONNA Tzzxijbd 8-5  After hours and holidays please check [email or web address] for group call information for [facility]  Army MARLA Daring 06/28/2024, 9:06 AM "

## 2024-06-28 NOTE — Progress Notes (Signed)
 Patient has diabetes with recent blood sugar of 221.  We have monitored this since it was seen on 1/7 with CBGs and they are under better control.  She also has orders for insulin .   Fonda SHAUNNA Olmsted, MD

## 2024-06-29 ENCOUNTER — Other Ambulatory Visit (HOSPITAL_COMMUNITY): Payer: Self-pay

## 2024-06-29 LAB — GLUCOSE, CAPILLARY
Glucose-Capillary: 111 mg/dL — ABNORMAL HIGH (ref 70–99)
Glucose-Capillary: 159 mg/dL — ABNORMAL HIGH (ref 70–99)
Glucose-Capillary: 115 mg/dL — ABNORMAL HIGH (ref 70–99)
Glucose-Capillary: 168 mg/dL — ABNORMAL HIGH (ref 70–99)

## 2024-06-29 MED ORDER — DIPHENHYDRAMINE HCL 25 MG PO CAPS
25.0000 mg | ORAL_CAPSULE | Freq: Every day | ORAL | Status: DC
  Administered 2024-06-29 – 2024-06-30 (×2): 25 mg via ORAL
  Filled 2024-06-29 (×2): qty 1

## 2024-06-29 MED ORDER — BISMUTH SUBSALICYLATE 262 MG PO CHEW: 524.0000 mg | CHEWABLE_TABLET | ORAL | Status: DC | PRN

## 2024-06-29 MED ORDER — SENNA-DOCUSATE SODIUM 8.6-50 MG PO TABS
2.0000 | ORAL_TABLET | Freq: Every day | ORAL | 1 refills | Status: AC
  Filled 2024-06-29: qty 30, 15d supply, fill #0

## 2024-06-29 MED ORDER — HYDROCODONE-ACETAMINOPHEN 5-325 MG PO TABS: 1.0000 | ORAL_TABLET | ORAL | 0 refills | Status: AC | PRN

## 2024-06-29 NOTE — Plan of Care (Signed)
  Problem: Pain Management: Goal: Pain level will decrease with appropriate interventions Outcome: Progressing   Problem: Activity: Goal: Ability to tolerate increased activity will improve Outcome: Progressing   Problem: Education: Goal: Knowledge of the prescribed therapeutic regimen will improve Outcome: Progressing   Problem: Safety: Goal: Ability to remain free from injury will improve Outcome: Progressing

## 2024-06-29 NOTE — Progress Notes (Signed)
 Occupational Therapy Treatment Patient Details Name: Melissa [name] MRN: [id number] DOB: 1935-06-02 Today's Date: 06/29/2024   History of present illness Patient is an 89 yo female presenting to the hospital for a R total shoulder arthroplasty on 06/25/24. PMH: diabetes, GERD, recent PE on eliquis , afib, right bundle branch block   OT comments  Patient seen for skilled OT session. Reinforced shoulder protocol, iceman application and sling positioning, compensatory techniques for light ADL's and functional mobility with SPC. Pain low with meds, cold therapy and positioning properly. Completed light distal AROM elbow, wrist, hand R UE with min cues to maintain shoulder stable and avoid elevation. Patient will benefit from continued inpatient follow up therapy, <3 hours/day and is a strong candidate to progress with high motivation and participation. Patient requires continued Acute care hospital level OT services to progress safety and functional performance and allow for discharge.        If plan is discharge home, recommend the following:  A little help with walking and/or transfers;A lot of help with bathing/dressing/bathroom;Assistance with cooking/housework;Assist for transportation   Equipment Recommendations  Other (comment) (defer to next setting)       Precautions / Restrictions Precautions Precautions: Shoulder;Fall Type of Shoulder Precautions: No AROM No PROM of shoulder, NWB RUE, AROM elbow, wrist, and hand permitted, sling at all times Shoulder Interventions: Shoulder sling/immobilizer;At all times Precaution Booklet Issued: Yes (comment) Recall of Precautions/Restrictions: Intact Required Braces or Orthoses: Sling Restrictions Weight Bearing Restrictions Per Provider Order: Yes RUE Weight Bearing Per Provider Order: Non weight bearing       Mobility Bed Mobility Overal bed mobility: Modified Independent             General bed mobility comments: use of bed  features but no assist needed    Transfers Overall transfer level: Needs assistance Equipment used: Straight cane Transfers: Sit to/from Stand Sit to Stand: Min assist           General transfer comment: Min A for safety     Balance Overall balance assessment: Mild deficits observed, not formally tested                                         ADL either performed or assessed with clinical judgement   ADL Overall ADL's : Needs assistance/impaired Eating/Feeding: Minimal assistance;Sitting   Grooming: Set up;Sitting Grooming Details (indicate cue type and reason): Pt required set-up assist to perform teeth brushing in supported sitting a bedside chair level. Upper Body Bathing: Maximal assistance;Sitting   Lower Body Bathing: Maximal assistance;Sitting/lateral leans;Sit to/from stand   Upper Body Dressing : Maximal assistance;Sitting;Cueing for UE precautions;Cueing for compensatory techniques;Cueing for sequencing Upper Body Dressing Details (indicate cue type and reason): OT provided further reinforcement on correctly donning/doffing and positioning RUE sling, as well as shoulder pad for Ice man machine. Pt required max assist in this regard seated in the chair. Lower Body Dressing: Moderate assistance;Cueing for compensatory techniques;Cueing for sequencing Lower Body Dressing Details (indicate cue type and reason): Pt required assist to a brief, then to donn another clean one. She was instructed on performing most of the tasks seated, for added safety and decreased falls risk. Toilet Transfer: Minimal assistance;Ambulation   Toileting- Clothing Manipulation and Hygiene: Moderate assistance;Sit to/from stand;Sitting/lateral lean       Functional mobility during ADLs: Moderate assistance;Cueing for safety;Cueing for sequencing General ADL Comments: Prior to this  admission, patient living alone, fully independent, and occasionally uses a cane or walker when  needed. Patient still drives and manages all of her IADLs. Patient does endorse falls. Currently, patient with no pain in R shoulder, however cannot actively move elbow or wrist. Patient can flex and extend digits. Shoulder education provided with handouts also placed in patient's room. Patient mod I for bed mobility, min A for ambulation (one person HHA) and mod A for ADL management due to precautions. OT will continue to follow acutely, and recommending rehab of a lesser intensity < 3 hours prior to discharge home.    Extremity/Trunk Assessment Upper Extremity Assessment Upper Extremity Assessment: Right hand dominant;RUE deficits/detail   Lower Extremity Assessment Lower Extremity Assessment: Defer to PT evaluation        Vision   Vision Assessment?: No apparent visual deficits   Perception Perception Perception: Within Functional Limits   Praxis Praxis Praxis: WFL   Communication Communication Communication: No apparent difficulties Factors Affecting Communication: Hearing impaired   Cognition Arousal: Alert Behavior During Therapy: WFL for tasks assessed/performed Cognition: No apparent impairments                               Following commands: Intact        Cueing   Cueing Techniques: Verbal cues  Exercises Exercises: Other exercises Other Exercises Other Exercises: R elbow, wrist, hand 10 reps AROM       General Comments patient on RA with sats 98%, dresisng intact on R shoulder and sling in place    Pertinent Vitals/ Pain       Pain Assessment Pain Assessment: Faces Faces Pain Scale: Hurts a little bit Pain Location: shoudler Pain Descriptors / Indicators: Discomfort Pain Intervention(s): Limited activity within patient's tolerance, Monitored during session, Premedicated before session, Repositioned, Ice applied   Frequency  Min 2X/week        Progress Toward Goals  OT Goals(current goals can now be found in the care plan  section)  Progress towards OT goals: Progressing toward goals  Acute Rehab OT Goals Patient Stated Goal: to get to rehab OT Goal Formulation: With patient Time For Goal Achievement: 07/10/24 Potential to Achieve Goals: Good ADL Goals Pt Will Perform Lower Body Bathing: with modified independence;sit to/from stand;sitting/lateral leans Pt Will Perform Lower Body Dressing: with modified independence;sitting/lateral leans;sit to/from stand Pt Will Transfer to Toilet: with modified independence;ambulating;regular height toilet Pt Will Perform Toileting - Clothing Manipulation and hygiene: with modified independence;sitting/lateral leans;sit to/from stand Additional ADL Goal #1: Patient will be able to state shoulder precautions and adhere to exercises without cues to promote independence.  Plan         AM-PAC OT 6 Clicks Daily Activity     Outcome Measure   Help from another person eating meals?: A Little Help from another person taking care of personal grooming?: A Little Help from another person toileting, which includes using toliet, bedpan, or urinal?: A Little Help from another person bathing (including washing, rinsing, drying)?: A Lot Help from another person to put on and taking off regular upper body clothing?: A Lot Help from another person to put on and taking off regular lower body clothing?: A Lot 6 Click Score: 15    End of Session Equipment Utilized During Treatment: Other (comment) (cane)  OT Visit Diagnosis: Unsteadiness on feet (R26.81);Muscle weakness (generalized) (M62.81);History of falling (Z91.81)   Activity Tolerance Patient tolerated treatment well  Patient Left in chair;with call bell/phone within reach;with chair alarm set   Nurse Communication Mobility status        Time: 9069-9044 OT Time Calculation (min): 25 min  Charges: OT General Charges $OT Visit: 1 Visit OT Treatments $Self Care/Home Management : 8-22 mins $Therapeutic Exercise: 8-22  mins  Zackari Ruane OT/L Acute Rehabilitation Department  [phone]  06/29/2024, 12:59 PM

## 2024-06-29 NOTE — Discharge Instructions (Signed)
 Shoulder Replacement Post-Operative Instructions  DIET:  Return to eating and drinking as you normally would. You will need to add some extra fluids (water) to prevent constipation.   WOUND CARE: The post-op dressing is not water proof, please cover your shoulder with Press n' Seal saran wrap or a plastic bag when showering If it fills with liquid or blood please call us  immediately to change it for you. There may be a small amount of fluid/bleeding leaking at the surgical site. This is normal after surgery.  Use your Ice machine or Ice as often as needed for pain relief. Always keep a towel, ACE wrap or other barrier between the cooling unit and your skin.  Do not soak the incision in water or submerge it.  Keep dry incisions as dry as possible.  REGIONAL ANESTHESIA (NERVE BLOCKS) The anesthesia team may have performed a nerve block for you if safe in the setting of your care.  This is a great tool used to minimize pain.  If you had a block, the short acting medicine will wear off between 8-24 hrs postop typically but the long acting usually will start working around 20-24 hrs postop.  There may be a period where you have more pain while waiting for the long-acting medicine to take effect.  Please use an extra dose of pain medication if needed at this point. We suggest you use the pain medication the first night prior to going to bed, in order to ease any pain when the anesthesia wears off. You should avoid taking pain medications on an empty stomach as it will make you nauseous.   MEDICATIONS:   Listed below are some of the medications that you will be given after surgery.  If you have questions about any of them please ask and we will be glad to go over them with you.  Prescriptions unless otherwise discussed are electronically sent to your pharmacy.   Oxycodone or Hydrocodone  - These are strong narcotics, to be used only on an "as needed" basis for pain.  If you think that you will need a  refill of this medicine, please plan ahead.  These will not be filled at night or over the weekend. Please note, we can only give 1 week at a time because of [place] law.  Stop this medicine as soon as you are able. Zofran  - take as needed for nausea or vomiting Acetaminophen (Tylenol ):  non-narcotic medicine for pain. Take to wean off the narcotics. You can pick this up over the counter at your pharmacy. If you were given hydrocodone  after surgery, hydrocodone  also contains Tylenol . You may take Tylenol  on top of your hydrocodone  or oxycodone but your total Tylenol  dose should not exceed 4000mg  in 24 hours.  Senokot-S - stool softener to help with constipation caused by narcotic pain medication  You should wean off your narcotic medicines as soon as you are able.  Most patients will be off or using minimal narcotics before their first postop appointment. Do not drink alcoholic beverages or take illicit drugs when taking pain medications. It is against the law to drive while taking narcotics.    Constipation:  Is common after surgery.  To prevent this, increase your fluid intake and dietary fiber. Reduce or stop using narcotic medicine.  You may also use prune juice, Colace, or Miralax 17 grams (one cap full) in 8 ounces of something to drink 1-3 times a day until bowel movements are regular.  You getting up and  moving will also help with constipation. Pain medication may make you constipated.  Below are a few solutions to try in this order: Decrease the amount of pain medication if you aren't having pain. Drink lots of decaffeinated fluids. Drink prune juice and/or each dried prunes  Precautions: If you experience any type of sudden onset of chest pain or sudden shortness of breath call 911!! Immediately.   For other issues - Fever over 101, increased swelling on surgical side, increased pain, wound drainage; please contact  the office [phone].   We would prefer to care for you in our office  rather than a trip to the ER.   Brace/Activity: A sling has been provided for you. You may remove the sling 3 times a day to move your elbow. Otherwise, you should be wearing your sling at all times.  You may remove the sling for showering, Keep your arm by your side during your shower. You may lift it minimally to wash under it and dry completely.  You may be more comfortable sleeping in a semi-seated position the first few nights following surgery.  Keep a pillow propped under the elbow and forearm for comfort.  If you have a recliner type of chair it might be beneficial.  If not, that is fine too, but it would be helpful to sleep propped up with pillows behind your operated shoulder as well under your elbow and forearm. You may return to work/school in the next couple of days when you feel up to it. Desk work and typing in the sling is fine. When dressing, put your operative arm in the sleeve first.  When getting undressed, take your operative arm out last.  Loose fitting, button-down shirts are recommended.  Often in the first days after surgery you may be more comfortable keeping your operative arm under your shirt and not through the sleeve. Do not lift anything heavier than 1 pound until we discuss it further in clinic.  Post op follow up:  Your first follow-up visit after surgery will be made prior to your surgery. This will be scheduled for approximately 2 weeks after surgery.  Your physical therapy schedule will be discussed with your surgeon.  Thank you for choosing our office to care for you!

## 2024-06-29 NOTE — Progress Notes (Addendum)
 "    Subjective: 4 Days Post-Op s/p Procedures: ARTHROPLASTY, SHOULDER, TOTAL, REVERSE   Patient is alert, oriented. Sitting up in chair.No cough. No chest pain. No nausea/vomiting. Swelling in right hand has resolved. Can get to sleep at night but hard to stay asleep, asking for something to help with this. Was using tylenol  pm at home prior to admission. No other complaints.  Objective:  PE: VITALS:   Vitals:   06/28/24 1314 06/28/24 2148 06/29/24 0419 06/29/24 0955  BP: (!) 111/57 (!) 120/59 132/64   Pulse: (!) 102 99 (!) 107 100  Resp: 15 16 17    Temp: 97.9 F (36.6 C) 98.4 F (36.9 C) 97.8 F (36.6 C)   TempSrc:  Oral Oral   SpO2: 95% 95% 95%   Weight:      Height:       General: sitting up in bed, in no acute distress MSK: RUE in sling.Right hand edema resolved. Full motion at all fingers of right hand. Sensation intact to all fingers of right hand. Sensation intact to axillary nerve distribution. 2+ radial pulse. Incision CDI.   LABS  Results for orders placed or performed during the hospital encounter of 06/25/24 (from the past 24 hours)  Glucose, capillary     Status: Abnormal   Collection Time: 06/28/24 11:47 AM  Result Value Ref Range   Glucose-Capillary 114 (H) 70 - 99 mg/dL  Glucose, capillary     Status: Abnormal   Collection Time: 06/28/24  5:00 PM  Result Value Ref Range   Glucose-Capillary 133 (H) 70 - 99 mg/dL  Glucose, capillary     Status: Abnormal   Collection Time: 06/28/24  9:50 PM  Result Value Ref Range   Glucose-Capillary 137 (H) 70 - 99 mg/dL  Glucose, capillary     Status: Abnormal   Collection Time: 06/29/24  7:23 AM  Result Value Ref Range   Glucose-Capillary 111 (H) 70 - 99 mg/dL    CT Angio Chest Pulmonary Embolism (PE) W or WO Contrast Result Date: 06/27/2024 EXAM: CTA of the Chest with and without contrast for PE 06/27/2024 03:12:57 PM TECHNIQUE: CTA of the chest was performed without and with the administration of 75 mL of  intravenous contrast (iohexol  (OMNIPAQUE ) 350 MG/ML injection 75 mL IOHEXOL  350 MG/ML SOLN). Multiplanar reformatted images are provided for review. MIP images are provided for review. Automated exposure control, iterative reconstruction, and/or weight based adjustment of the mA/kV was utilized to reduce the radiation dose to as low as reasonably achievable. COMPARISON: 02/25/2023 CLINICAL HISTORY: Pulmonary embolism (PE) suspected, low to intermediate prob, positive D-dimer. FINDINGS: PULMONARY ARTERIES: The distal segmental and subsegmental branches of both lower lobes are degraded by motion artifact. Otherwise, no pulmonary embolism visualized. Main pulmonary artery is normal in caliber. MEDIASTINUM: Mild cardiomegaly. Scattered multivessel coronary atherosclerosis. Large hiatal hernia containing an intrathoracic stomach. The pericardium demonstrates no acute abnormality. There is no acute abnormality of the thoracic aorta. LYMPH NODES: No mediastinal, hilar or axillary lymphadenopathy. LUNGS AND PLEURA: Minimal subpleural reticulation along the lateral left upper and left lower lobes, possibly postinfectious fibrosis. Bibasilar atelectasis. Trace right pleural effusion. No pneumothorax. UPPER ABDOMEN: Large hiatal hernia containing an intrathoracic stomach. SOFT TISSUES AND BONES: Subcutaneous gas with gas interdigitating about the right shoulder musculature, which is an expected finding of this stage in the postoperative recovery. The glenohumeral arthroplasty is otherwise well aligned without dislocation. Diffuse osteopenia. Multilevel degenerative disc disease of the spine. Unchanged 1 cm hypodensity in the upper inner  quadrant of the left breast, which was previously aspirated. IMPRESSION: 1. No pulmonary embolism visualized, although evaluation of the distal segmental and subsegmental branches of both lower lobes are degraded by motion artifact. 2. Trace right pleural effusion. 3. Large hiatal hernia  containing an intrathoracic stomach. No findings of vascular compromise. Electronically signed by: Rogelia Myers MD MD 06/27/2024 04:20 PM EST RP Workstation: [id number]    Today's  total administered Morphine  Milligram Equivalents: 5 Yesterday's total administered Morphine  Milligram Equivalents: 0  Assessment/Plan: Principal Problem:   S/P reverse total shoulder arthroplasty, right  4 Days Post-Op s/p Procedures: ARTHROPLASTY, SHOULDER, TOTAL, REVERSE  SOB:  - improved with nebulizer and O2 therapy - patient does have intermittent dyspnea at baseline. Vitals within normal range this morning. Was on supplemental 2L O2 last night. PERC > 4, Chest x-ray and CT angio ordered negative for infection or PE. Doing well on room air, no new SOB.   Weightbearing: NWB RUE Insicional and dressing care: Reinforce dressings as needed Orthopedic device(s): Sling at all times Pain control: block still in place, continue current regimen Follow - up plan: 2 weeks with Dr. Josefina Dispo: Patient states that her family is pursuing SNF placement due to her living at home with limited support. Patient has bed at [facility] on Monday, plan to discharge to SNF on Monday.   Contact information:   Army Daring, DEVONNA Tzzxijbd 8-5  After hours and holidays please check [email or web address] for group call information for [facility]  Army MARLA Daring 06/29/2024, 10:39 AM "

## 2024-06-30 LAB — GLUCOSE, CAPILLARY
Glucose-Capillary: 109 mg/dL — ABNORMAL HIGH (ref 70–99)
Glucose-Capillary: 131 mg/dL — ABNORMAL HIGH (ref 70–99)
Glucose-Capillary: 109 mg/dL — ABNORMAL HIGH (ref 70–99)
Glucose-Capillary: 184 mg/dL — ABNORMAL HIGH (ref 70–99)

## 2024-06-30 NOTE — Progress Notes (Signed)
" ° ° ° °  Subjective: 5 Days Post-Op s/p Procedures: ARTHROPLASTY, SHOULDER, TOTAL, REVERSE   Patient is alert, oriented. Sitting up in bed.No cough. No chest pain. No nausea/vomiting. Swelling in right hand has resolved. Slept better last night and stomach pains that occurred yesterday have resolved. Voiding and having bowel movements.  No other complaints.  Objective:  PE: VITALS:   Vitals:   06/29/24 0955 06/29/24 1534 06/29/24 2138 06/30/24 0531  BP:  118/60 133/73 125/65  Pulse: 100 94 94 89  Resp:  18 16 16   Temp:  98 F (36.7 C) 98.2 F (36.8 C) 98.7 F (37.1 C)  TempSrc:  Oral Oral   SpO2:  96% 95% 95%  Weight:      Height:       General: sitting up in bed, in no acute distress MSK: RUE in sling.Right hand edema resolved. Full motion at all fingers of right hand. Sensation intact to all fingers of right hand. Sensation intact to axillary nerve distribution. 2+ radial pulse. Incision CDI.   LABS  Results for orders placed or performed during the hospital encounter of 06/25/24 (from the past 24 hours)  Glucose, capillary     Status: Abnormal   Collection Time: 06/29/24 11:34 AM  Result Value Ref Range   Glucose-Capillary 159 (H) 70 - 99 mg/dL  Glucose, capillary     Status: Abnormal   Collection Time: 06/29/24  5:04 PM  Result Value Ref Range   Glucose-Capillary 115 (H) 70 - 99 mg/dL  Glucose, capillary     Status: Abnormal   Collection Time: 06/29/24  9:40 PM  Result Value Ref Range   Glucose-Capillary 168 (H) 70 - 99 mg/dL    No results found.   Today's  total administered Morphine  Milligram Equivalents: 0 Yesterday's total administered Morphine  Milligram Equivalents: 10  Assessment/Plan: Principal Problem:   S/P reverse total shoulder arthroplasty, right  5 Days Post-Op s/p Procedures: ARTHROPLASTY, SHOULDER, TOTAL, REVERSE  SOB:  - improved with nebulizer and O2 therapy - patient does have intermittent dyspnea at baseline. Vitals within normal range  this morning. Was on supplemental 2L O2 last night. PERC > 4, Chest x-ray and CT angio ordered negative for infection or PE. Doing well on room air, no new SOB.   Weightbearing: NWB RUE Insicional and dressing care: Reinforce dressings as needed Orthopedic device(s): Sling at all times Pain control: block still in place, continue current regimen Follow - up plan: 2 weeks with Dr. Josefina Dispo: Patient states that her family is pursuing SNF placement due to her living at home with limited support. Patient has bed at [facility] on Monday, plan to discharge to SNF on Monday.   Contact information:   Army Daring, DEVONNA Tzzxijbd 8-5  After hours and holidays please check [email or web address] for group call information for [facility]  Army MARLA Daring 06/30/2024, 7:28 AM "

## 2024-06-30 NOTE — Plan of Care (Signed)
  Problem: Pain Management: Goal: Pain level will decrease with appropriate interventions Outcome: Progressing   Problem: Activity: Goal: Ability to tolerate increased activity will improve Outcome: Progressing   Problem: Education: Goal: Knowledge of the prescribed therapeutic regimen will improve Outcome: Progressing   Problem: Safety: Goal: Ability to remain free from injury will improve Outcome: Progressing

## 2024-07-01 LAB — GLUCOSE, CAPILLARY
Glucose-Capillary: 111 mg/dL — ABNORMAL HIGH (ref 70–99)
Glucose-Capillary: 137 mg/dL — ABNORMAL HIGH (ref 70–99)

## 2024-07-01 NOTE — Plan of Care (Signed)
  Problem: Pain Management: Goal: Pain level will decrease with appropriate interventions Outcome: Progressing   Problem: Activity: Goal: Ability to tolerate increased activity will improve Outcome: Progressing   Problem: Safety: Goal: Ability to remain free from injury will improve Outcome: Progressing   Problem: Elimination: Goal: Will not experience complications related to urinary retention Outcome: Progressing

## 2024-07-01 NOTE — Progress Notes (Signed)
 Report called to [facility] and spoke with jess. Pts family will be her transportation to facility. AVS printed and placed in discharge packet.

## 2024-07-01 NOTE — Progress Notes (Signed)
 Occupational Therapy Treatment Patient Details Name: Melissa [name] MRN: [id number] DOB: 10-08-1934 Today's Date: 07/01/2024   History of present illness Patient is an 89 yo female presenting to the hospital for a R total shoulder arthroplasty on 06/25/24. PMH: diabetes, GERD, recent PE on eliquis , afib, right bundle branch block   OT comments  Patient seen for follow up OT session. Addressed Am self care in bathroom and light shoulder protocol recliner level. See below for current status. Reinforced all precautions and sling use.  Education completed regarding compensatory strategies for ADL tasks and functional mobility, management of sling, R ROM per specified parameters in the order set as indicated below, positioning of operative arm in sitting and supine and edema control, including use of Iceman Cold Therapy machine. Due to patient living alone, MD has approved for patient to seek continued inpatient follow up therapy, <3 hours/day and follow MD protocol and follow up as directed by surgeon. Patient will benefit from continued acute level OT services.         If plan is discharge home, recommend the following:  A little help with walking and/or transfers;A lot of help with bathing/dressing/bathroom;Assistance with cooking/housework;Assist for transportation   Equipment Recommendations  Other (comment) (defer to next setting)       Precautions / Restrictions Precautions Precautions: Shoulder;Fall Type of Shoulder Precautions: No AROM No PROM of shoulder, NWB RUE, AROM elbow, wrist, and hand permitted, sling at all times Shoulder Interventions: Shoulder sling/immobilizer;At all times Precaution Booklet Issued: Yes (comment) Recall of Precautions/Restrictions: Intact Required Braces or Orthoses: Sling Restrictions Weight Bearing Restrictions Per Provider Order: Yes RUE Weight Bearing Per Provider Order: Non weight bearing       Mobility Bed Mobility Overal bed mobility:  Modified Independent             General bed mobility comments: use of bed features but no assist needed    Transfers Overall transfer level: Needs assistance Equipment used: Straight cane Transfers: Sit to/from Stand Sit to Stand: Min assist           General transfer comment: Min A for safety     Balance Overall balance assessment: Mild deficits observed, not formally tested                                         ADL either performed or assessed with clinical judgement   ADL Overall ADL's : Needs assistance/impaired Eating/Feeding: Sitting;Set up   Grooming: Wash/dry hands;Wash/dry face;Oral care;Applying deodorant;Brushing hair;Contact guard assist;Standing Grooming Details (indicate cue type and reason): Pt required set-up assist to perform teeth brushing in standing Upper Body Bathing: Sitting;Moderate assistance;Cueing for compensatory techniques   Lower Body Bathing: Sitting/lateral leans;Sit to/from stand;Moderate assistance;Cueing for compensatory techniques   Upper Body Dressing : Sitting;Cueing for UE precautions;Cueing for compensatory techniques;Cueing for sequencing;Moderate assistance Upper Body Dressing Details (indicate cue type and reason): OT provided further reinforcement on correctly donning/doffing and positioning RUE sling, as well as shoulder pad for Ice man machine. Lower Body Dressing: Moderate assistance;Cueing for compensatory techniques;Cueing for sequencing Lower Body Dressing Details (indicate cue type and reason): bathroom level on commode with STS Toilet Transfer: Minimal assistance;Ambulation Toilet Transfer Details (indicate cue type and reason): SPC use Toileting- Clothing Manipulation and Hygiene: Sit to/from stand;Sitting/lateral lean;Minimal assistance       Functional mobility during ADLs: Cueing for safety;Cueing for sequencing;Minimal assistance General ADL Comments: improving  with overall ADL's with L UE as  primary assist and R UE only as distal support/stabilizer, balance improved    Extremity/Trunk Assessment Upper Extremity Assessment Upper Extremity Assessment: Right hand dominant;RUE deficits/detail RUE Deficits / Details: RUE in sling, no difference in sensation, cannot currently actively move elbow and wrist, can flex and extend digits, swelling noted throughout RUE Sensation: WNL RUE Coordination: decreased fine motor;decreased gross motor   Lower Extremity Assessment Lower Extremity Assessment: Defer to PT evaluation        Vision   Vision Assessment?: No apparent visual deficits   Perception Perception Perception: Within Functional Limits   Praxis Praxis Praxis: WFL   Communication Communication Communication: No apparent difficulties Factors Affecting Communication: Hearing impaired   Cognition Arousal: Alert Behavior During Therapy: WFL for tasks assessed/performed Cognition: No apparent impairments                               Following commands: Intact        Cueing   Cueing Techniques: Verbal cues  Exercises Exercises: Other exercises Other Exercises Other Exercises: R elbow, wrist, hand 10 reps AROM    Shoulder Instructions       General Comments post op dressing on R shoulder intact, no SOB on RA    Pertinent Vitals/ Pain       Pain Assessment Pain Assessment: Faces Faces Pain Scale: No hurt Pain Intervention(s): Monitored during session   Frequency  Min 2X/week        Progress Toward Goals  OT Goals(current goals can now be found in the care plan section)  Progress towards OT goals: Progressing toward goals  Acute Rehab OT Goals OT Goal Formulation: With patient Time For Goal Achievement: 07/10/24 Potential to Achieve Goals: Good ADL Goals Pt Will Perform Lower Body Bathing: with modified independence;sit to/from stand;sitting/lateral leans Pt Will Perform Lower Body Dressing: with modified  independence;sitting/lateral leans;sit to/from stand Pt Will Transfer to Toilet: with modified independence;ambulating;regular height toilet Pt Will Perform Toileting - Clothing Manipulation and hygiene: with modified independence;sitting/lateral leans;sit to/from stand Additional ADL Goal #1: Patient will be able to state shoulder precautions and adhere to exercises without cues to promote independence.  Plan         AM-PAC OT 6 Clicks Daily Activity     Outcome Measure   Help from another person eating meals?: A Little Help from another person taking care of personal grooming?: A Little Help from another person toileting, which includes using toliet, bedpan, or urinal?: A Little Help from another person bathing (including washing, rinsing, drying)?: A Lot Help from another person to put on and taking off regular upper body clothing?: A Lot Help from another person to put on and taking off regular lower body clothing?: A Lot 6 Click Score: 15    End of Session Equipment Utilized During Treatment: Other (comment) (cane)  OT Visit Diagnosis: Unsteadiness on feet (R26.81);Muscle weakness (generalized) (M62.81);History of falling (Z91.81)   Activity Tolerance Patient tolerated treatment well   Patient Left in chair;with call bell/phone within reach;with chair alarm set   Nurse Communication Mobility status        Time: [phone] OT Time Calculation (min): 26 min  Charges: OT General Charges $OT Visit: 1 Visit OT Treatments $Self Care/Home Management : 8-22 mins $Therapeutic Exercise: 8-22 mins  Jung Yurchak OT/L Acute Rehabilitation Department  [phone]  07/01/2024, 10:02 AM

## 2024-07-01 NOTE — TOC Transition Note (Signed)
 Transition of Care [facility]) - Discharge Note  Patient Details  Name: Melissa [name] MRN: [id number] Date of Birth: Feb 09, 1935  Transition of Care [facility]) CM/SW Contact:  Duwaine GORMAN Aran, LCSW Phone Number: 07/01/2024, 11:31 AM  Clinical Narrative: CSW confirmed bed with Levon in admissions at [facility] and patient can admit today. Per son, he or his wife will provide transport to the facility and complete admission paperwork. The number for report is [phone]. Discharge summary, discharge orders, and SNF transfer report faxed to facility in hub. Discharge packet completed and son is aware he will need to take it to the facility as it has a script for pain medication. RN updated. Care management signing off.  Final next level of care: Skilled Nursing Facility Barriers to Discharge: Barriers Resolved  Patient Goals and CMS Choice Patient states their goals for this hospitalization and ongoing recovery are:: return home following SNF rehab CMS Medicare.gov Compare Post Acute Care list provided to:: Patient Represenative (must comment) Choice offered to / list presented to : Adult Children  Discharge Placement Existing PASRR number confirmed : 06/26/24          Patient chooses bed at: [facility] Patient to be transferred to facility by: Family Name of family member notified: Zivah Mayr (son) Patient and family notified of of transfer: 07/01/24  Discharge Plan and Services Additional resources added to the After Visit Summary for   In-house Referral: Clinical Social Work Post Acute Care Choice: Skilled Nursing Facility          DME Arranged: N/A DME Agency: NA  Social Drivers of Health (SDOH) Interventions SDOH Screenings   Food Insecurity: No Food Insecurity (06/26/2024)  Housing: Low Risk (06/26/2024)  Transportation Needs: No Transportation Needs (06/26/2024)  Utilities: Not At Risk (06/26/2024)  Alcohol Screen: Low Risk (11/01/2023)  Depression (PHQ2-9): Low Risk  (03/05/2024)  Financial Resource Strain: Low Risk (11/01/2023)  Physical Activity: Inactive (11/01/2023)  Social Connections: Unknown (06/26/2024)  Stress: No Stress Concern Present (11/01/2023)  Tobacco Use: Low Risk (06/25/2024)  Health Literacy: Adequate Health Literacy (11/01/2023)   Readmission Risk Interventions     No data to display

## 2024-07-01 NOTE — Discharge Summary (Signed)
 Discharge Summary  Patient ID: Melissa [name] MRN: [id number] DOB/AGE: 1934-07-06 89 y.o.  Admit date: 06/25/2024 Discharge date: 07/01/2024  Admission Diagnoses:  S/P reverse total shoulder arthroplasty, right  Discharge Diagnoses:  Principal Problem:   S/P reverse total shoulder arthroplasty, right   Past Medical History:  Diagnosis Date   Burning tongue syndrome 11/11/2015   Colon polyps    Cystocele with prolapse 2019   Dr. Starla- Monitor for now.    Detached retina, right    Diabetes mellitus    Diaphragmatic hernia    Diverticulitis 01/26/2022   Dyspnea    GERD (gastroesophageal reflux disease)    Humerus fracture 02/26/2023   Hyperlipidemia    Insomnia    Macular degeneration    Moderate mitral valve stenosis    Noted on ECHO   Paroxysmal atrial fibrillation (HCC) 05/2023   PONV (postoperative nausea and vomiting)    Pulmonary embolism (HCC) 2024   after humerus fracture   Right bundle branch block (RBBB)    Noted on EKG    Surgeries: Procedures: ARTHROPLASTY, SHOULDER, TOTAL, REVERSE on 06/25/2024   Consultants (if any):   Discharged Condition: Improved  Hospital Course: Melissa [name] is an 89 y.o. female who was admitted 06/25/2024 with a diagnosis of S/P reverse total shoulder arthroplasty, right and went to the operating room on 06/25/2024 and underwent the above named procedures.    Today's  total administered Morphine  Milligram Equivalents: 0 Yesterday's total administered Morphine  Milligram Equivalents: 0  She was given perioperative antibiotics:  Anti-infectives (From admission, onward)    Start     Dose/Rate Route Frequency Ordered Stop   06/25/24 2100  ceFAZolin  (ANCEF ) IVPB 2g/100 mL premix        2 g 200 mL/hr over 30 Minutes Intravenous Every 6 hours 06/25/24 2007 06/26/24 0759   06/25/24 1100  ceFAZolin  (ANCEF ) IVPB 2g/100 mL premix        2 g 200 mL/hr over 30 Minutes Intravenous On call to O.R. 06/25/24 1054 06/25/24 1447      .  She was given sequential compression devices, early ambulation, and baseline Eliquis  for DVT prophylaxis.  She benefited maximally from the hospital stay and there were no complications.    Recent vital signs:  Vitals:   06/30/24 2051 07/01/24 0451  BP: 120/60 (!) 141/76  Pulse: 94 93  Resp: 16 16  Temp: 98.6 F (37 C) 97.7 F (36.5 C)  SpO2: 96% 97%    Recent laboratory studies:  Lab Results  Component Value Date   HGB 9.8 (L) 06/28/2024   HGB 9.0 (L) 06/27/2024   HGB 9.5 (L) 06/26/2024   Lab Results  Component Value Date   WBC 8.4 06/28/2024   PLT 357 06/28/2024   Lab Results  Component Value Date   INR 0.9 03/19/2007   Lab Results  Component Value Date   NA 139 06/26/2024   K 4.6 06/26/2024   CL 103 06/26/2024   CO2 25 06/26/2024   BUN 18 06/26/2024   CREATININE 0.93 06/26/2024   GLUCOSE 221 (H) 06/26/2024    Discharge Medications:   Allergies as of 07/01/2024   No Known Allergies      Medication List     STOP taking these medications    oxyCODONE  5 MG immediate release tablet Commonly known as: Roxicodone    oxyCODONE -acetaminophen  5-325 MG tablet Commonly known as: PERCOCET/ROXICET       TAKE these medications    apixaban  2.5 MG Tabs tablet Commonly  known as: Eliquis  Take 1 tablet (2.5 mg total) by mouth 2 (two) times daily. What changed: additional instructions   atorvastatin  40 MG tablet Commonly known as: LIPITOR Take 1 tablet (40 mg total) by mouth daily.   escitalopram  5 MG tablet Commonly known as: Lexapro  Take 1 tablet (5 mg total) by mouth daily.   gabapentin  300 MG capsule Commonly known as: NEURONTIN  Take 2 capsules (600 mg total) by mouth at bedtime.   HYDROcodone -acetaminophen  5-325 MG tablet Commonly known as: NORCO/VICODIN Take 1 tablet by mouth every 4 (four) hours as needed for severe pain (pain score 7-10).   metFORMIN  500 MG tablet Commonly known as: GLUCOPHAGE  TAKE 1 TABLET EVERY MORNING & 2 TABLETS  AT NIGHT What changed:  how much to take how to take this when to take this additional instructions   metoprolol  succinate 50 MG 24 hr tablet Commonly known as: TOPROL -XL Take 1 tablet (50 mg total) by mouth daily. Take with or immediately following a meal.   Multivitamin Women 50+ Tabs Take 1 tablet by mouth daily.   ondansetron  4 MG disintegrating tablet Commonly known as: ZOFRAN -ODT Take 4 mg by mouth every 8 (eight) hours as needed for nausea or vomiting.   pantoprazole  40 MG tablet Commonly known as: PROTONIX  Take 1 tablet (40 mg total) by mouth daily.   sennosides-docusate sodium  8.6-50 MG tablet Commonly known as: SENOKOT-S Take 2 tablets by mouth daily.        Diagnostic Studies: CT Angio Chest Pulmonary Embolism (PE) W or WO Contrast Result Date: 06/27/2024 EXAM: CTA of the Chest with and without contrast for PE 06/27/2024 03:12:57 PM TECHNIQUE: CTA of the chest was performed without and with the administration of 75 mL of intravenous contrast (iohexol  (OMNIPAQUE ) 350 MG/ML injection 75 mL IOHEXOL  350 MG/ML SOLN). Multiplanar reformatted images are provided for review. MIP images are provided for review. Automated exposure control, iterative reconstruction, and/or weight based adjustment of the mA/kV was utilized to reduce the radiation dose to as low as reasonably achievable. COMPARISON: 02/25/2023 CLINICAL HISTORY: Pulmonary embolism (PE) suspected, low to intermediate prob, positive D-dimer. FINDINGS: PULMONARY ARTERIES: The distal segmental and subsegmental branches of both lower lobes are degraded by motion artifact. Otherwise, no pulmonary embolism visualized. Main pulmonary artery is normal in caliber. MEDIASTINUM: Mild cardiomegaly. Scattered multivessel coronary atherosclerosis. Large hiatal hernia containing an intrathoracic stomach. The pericardium demonstrates no acute abnormality. There is no acute abnormality of the thoracic aorta. LYMPH NODES: No mediastinal,  hilar or axillary lymphadenopathy. LUNGS AND PLEURA: Minimal subpleural reticulation along the lateral left upper and left lower lobes, possibly postinfectious fibrosis. Bibasilar atelectasis. Trace right pleural effusion. No pneumothorax. UPPER ABDOMEN: Large hiatal hernia containing an intrathoracic stomach. SOFT TISSUES AND BONES: Subcutaneous gas with gas interdigitating about the right shoulder musculature, which is an expected finding of this stage in the postoperative recovery. The glenohumeral arthroplasty is otherwise well aligned without dislocation. Diffuse osteopenia. Multilevel degenerative disc disease of the spine. Unchanged 1 cm hypodensity in the upper inner quadrant of the left breast, which was previously aspirated. IMPRESSION: 1. No pulmonary embolism visualized, although evaluation of the distal segmental and subsegmental branches of both lower lobes are degraded by motion artifact. 2. Trace right pleural effusion. 3. Large hiatal hernia containing an intrathoracic stomach. No findings of vascular compromise. Electronically signed by: Rogelia Myers MD MD 06/27/2024 04:20 PM EST RP Workstation: [id number]   DG Chest 1 View Result Date: 06/27/2024 CLINICAL DATA:  Shortness of breath. EXAM: CHEST  1 VIEW COMPARISON:  06/21/2023 FINDINGS: Patient is rotated to the left. Lungs are hypoinflated with bibasilar opacification left worse than right which may be due to atelectasis with effusions, although infection is possible. Interval right shoulder arthroplasty. Cardiomediastinal silhouette difficult to evaluate due to degree of rotation. Known large hiatal hernia. Remainder the exam is unchanged. IMPRESSION: 1. Hypoinflation with bibasilar opacification left worse than right which may be due to atelectasis with effusions, although infection is possible. Recommend repeat chest radiograph with less rotation versus PA and lateral chest radiograph for better evaluation of the lung bases. 2. Known large  hiatal hernia. Electronically Signed   By: Toribio Agreste M.D.   On: 06/27/2024 08:52   DG Shoulder Right Port Result Date: 06/25/2024 EXAM: 1 VIEW XRAY OF THE RIGHT SHOULDER 06/25/2024 06:02:00 PM COMPARISON: 06/08/2024. CLINICAL HISTORY: S/P reverse total shoulder arthroplasty, right. FINDINGS: BONES AND JOINTS: Interval Right reverse total shoulder arthroplasty noted. Acute mildly comminuted fracture of the lateral aspect of the proximal humeral diaphysis adjacent to the arthroplasty hardware. Residual fracture fragment lateral to the arthroplasty. The [facility] joint is unremarkable. SOFT TISSUES: Subcutaneous emphysema about the right shoulder joint. No abnormal calcifications. Visualized lung is unremarkable. IMPRESSION: 1. Interval right shoulder reverse arthroplasty with residual fracture fragments and fracture line demonstrated adjacent to the humeral component. 2. Subcutaneous emphysema about the right shoulder joint. Electronically signed by: Elsie Gravely MD 06/25/2024 10:22 PM EST RP Workstation: [id number]   DG Shoulder Right Result Date: 06/08/2024 EXAM: 1 VIEW(S) XRAY OF THE RIGHT SHOULDER 06/08/2024 09:33:00 AM COMPARISON: None available. CLINICAL HISTORY: fall fall FINDINGS: BONES AND JOINTS: There is a comminuted fracture of the right humeral head and neck. The bony glenoid is intact. There are mild degenerative changes of the acromioclavicular joint. The scapula and clavicle appeared to be intact. SOFT TISSUES: No abnormal calcifications. Visualized lung is unremarkable. IMPRESSION: 1. Comminuted fracture of the right humeral head and neck. 2. Mild degenerative changes of the acromioclavicular joint. Electronically signed by: Evalene Coho MD 06/08/2024 09:41 AM EST RP Workstation: [id number]   DG Forearm Right Result Date: 06/08/2024 EXAM: VIEW(S) XRAY OF THE FOREARM 06/08/2024 09:33:00 AM COMPARISON: None available. CLINICAL HISTORY: fall FINDINGS: FINDINGS: BONES AND JOINTS: The bones  are osteopenic. No acute fracture. No malalignment. Degenerative changes of the wrist with chondrocalcinosis. Widening of the scapholunate interval. SOFT TISSUES: The soft tissues are unremarkable. IMPRESSION: 1. No acute fracture or dislocation. 2. Widening of the scapholunate interval incidentally noted, which may reflect underlying [facility] wrist. Electronically signed by: Waddell Calk MD 06/08/2024 09:40 AM EST RP Workstation: [id number]   DG Humerus Right Result Date: 06/08/2024 EXAM: 1 VIEW(S) XRAY OF THE RIGHT HUMERUS 06/08/2024 09:33:00 AM COMPARISON: Right shoulder series dated 06/08/2024. CLINICAL HISTORY: fall FINDINGS: BONES AND JOINTS: There is a comminuted fracture of the humeral head and neck. The dominant fracture fragments are within the glenoid fossa. There is mild ventral displacement of the proximal humeral shaft. SOFT TISSUES: The soft tissues are unremarkable. IMPRESSION: 1. Comminuted fracture of the right humeral head and neck with dominant fracture fragments within the glenoid fossa and mild ventral displacement of the proximal humeral shaft. Electronically signed by: Evalene Coho MD 06/08/2024 09:40 AM EST RP Workstation: [id number]    Disposition: Discharge disposition: 03-Skilled Nursing Facility          Contact information for follow-up providers     Josefina Chew, MD. Schedule an appointment as soon as possible for a visit in 2 week(s).   Specialty: Orthopedic  Surgery Contact information: [address] [place] [address] [phone]              Contact information for after-discharge care     Destination     [facility] .   Service: Skilled Nursing Contact information: [address] [place]  [address] [phone]                      Signed: Army MARLA Daring PA-C 07/01/2024, 8:10 AM

## 2024-07-01 NOTE — Care Management Important Message (Signed)
 Important Message  Patient Details IM Letter given, Name: Melissa [name] MRN: [id number] Date of Birth: 1935/06/07   Important Message Given:  Yes - Medicare IM     [name] 07/01/2024, 11:31 AM

## 2024-07-18 ENCOUNTER — Telehealth: Payer: Self-pay

## 2024-07-18 NOTE — Telephone Encounter (Signed)
 Ok for verbal orders?   Copied from CRM [id number]. Topic: Clinical - Medical Advice >> Jul 18, 2024  4:06 PM Vena HERO wrote: Reason for CRM: Waddell from [facility] calling to initiate verbal orders for OT/PT. Needs confirmation that PCP will sign and follow so they can visit before storm starts Call back [phone]

## 2024-07-19 NOTE — Telephone Encounter (Signed)
 Patient seen for this condition December 2025, by this provider. Okay to approve home health orders

## 2024-07-19 NOTE — Telephone Encounter (Signed)
 Verbal orders given to [facility]

## 2024-07-26 NOTE — Telephone Encounter (Signed)
 LVM for Melissa [name] to return call.

## 2024-07-26 NOTE — Telephone Encounter (Signed)
Verbal orders approved.

## 2024-07-26 NOTE — Telephone Encounter (Signed)
 Copied from CRM [id number]. Topic: Clinical - Home Health Verbal Orders >> Jul 26, 2024 10:35 AM Burnard DEL wrote: Caller/Agency: Raj/ Hed[facility] Callback Number: [phone] Service Requested: Occupational Therapy Frequency: 1wk 8 right shoulder replacement Any new concerns about the patient? No   Okay for verbal orders?

## 2024-07-31 ENCOUNTER — Inpatient Hospital Stay: Admitting: Family Medicine

## 2024-09-10 ENCOUNTER — Encounter: Admitting: Family Medicine

## 2024-10-01 ENCOUNTER — Inpatient Hospital Stay

## 2024-10-01 ENCOUNTER — Inpatient Hospital Stay: Admitting: Oncology

## 2024-11-06 ENCOUNTER — Ambulatory Visit
